# Patient Record
Sex: Male | Born: 1960
Health system: Southern US, Community
[De-identification: ages and names within clinical notes are randomized; demographics above are authoritative.]

## PROBLEM LIST (undated history)

## (undated) DIAGNOSIS — I10 Essential (primary) hypertension: Secondary | ICD-10-CM

## (undated) DIAGNOSIS — K219 Gastro-esophageal reflux disease without esophagitis: Secondary | ICD-10-CM

## (undated) DIAGNOSIS — E119 Type 2 diabetes mellitus without complications: Secondary | ICD-10-CM

## (undated) DIAGNOSIS — F819 Developmental disorder of scholastic skills, unspecified: Secondary | ICD-10-CM

## (undated) DIAGNOSIS — E785 Hyperlipidemia, unspecified: Secondary | ICD-10-CM

## (undated) DIAGNOSIS — Z8679 Personal history of other diseases of the circulatory system: Secondary | ICD-10-CM

## (undated) DIAGNOSIS — I639 Cerebral infarction, unspecified: Secondary | ICD-10-CM

## (undated) HISTORY — DX: Essential (primary) hypertension: I10

## (undated) HISTORY — PX: COLONOSCOPY: SHX174

## (undated) HISTORY — PX: LYMPHADENECTOMY: SHX15

## (undated) HISTORY — DX: Hyperlipidemia, unspecified: E78.5

## (undated) HISTORY — DX: Personal history of other diseases of the circulatory system: Z86.79

## (undated) HISTORY — DX: Type 2 diabetes mellitus without complications: E11.9

## (undated) HISTORY — DX: Gastro-esophageal reflux disease without esophagitis: K21.9

## (undated) HISTORY — PX: RHINOPLASTY: SUR1284

## (undated) HISTORY — DX: Cerebral infarction, unspecified: I63.9

## (undated) HISTORY — PX: SIGMOIDOSCOPY: SUR1295

---

## 2000-08-12 ENCOUNTER — Encounter: Admission: RE | Admit: 2000-08-12 | Discharge: 2000-11-10 | Payer: Self-pay | Admitting: Endocrinology

## 2001-10-03 ENCOUNTER — Encounter: Admission: RE | Admit: 2001-10-03 | Discharge: 2001-10-03 | Payer: Self-pay | Admitting: Endocrinology

## 2001-10-03 ENCOUNTER — Encounter: Payer: Self-pay | Admitting: Endocrinology

## 2004-11-09 ENCOUNTER — Inpatient Hospital Stay (HOSPITAL_COMMUNITY): Admission: EM | Admit: 2004-11-09 | Discharge: 2004-11-13 | Payer: Self-pay | Admitting: Emergency Medicine

## 2004-11-09 ENCOUNTER — Ambulatory Visit: Payer: Self-pay | Admitting: Physical Medicine & Rehabilitation

## 2004-11-10 ENCOUNTER — Encounter (INDEPENDENT_AMBULATORY_CARE_PROVIDER_SITE_OTHER): Payer: Self-pay | Admitting: *Deleted

## 2004-12-24 ENCOUNTER — Inpatient Hospital Stay (HOSPITAL_COMMUNITY): Admission: RE | Admit: 2004-12-24 | Discharge: 2004-12-27 | Payer: Self-pay | Admitting: Psychiatry

## 2004-12-24 ENCOUNTER — Ambulatory Visit: Payer: Self-pay | Admitting: Psychiatry

## 2005-01-30 ENCOUNTER — Encounter: Admission: RE | Admit: 2005-01-30 | Discharge: 2005-04-30 | Payer: Self-pay | Admitting: Endocrinology

## 2005-03-10 ENCOUNTER — Ambulatory Visit: Payer: Self-pay | Admitting: Internal Medicine

## 2005-03-17 ENCOUNTER — Ambulatory Visit (HOSPITAL_COMMUNITY): Admission: RE | Admit: 2005-03-17 | Discharge: 2005-03-17 | Payer: Self-pay | Admitting: Internal Medicine

## 2005-04-09 ENCOUNTER — Ambulatory Visit: Payer: Self-pay | Admitting: Internal Medicine

## 2005-04-27 ENCOUNTER — Ambulatory Visit: Payer: Self-pay | Admitting: Internal Medicine

## 2005-05-11 ENCOUNTER — Ambulatory Visit: Payer: Self-pay | Admitting: Internal Medicine

## 2005-05-12 ENCOUNTER — Encounter
Admission: RE | Admit: 2005-05-12 | Discharge: 2005-05-19 | Payer: Self-pay | Admitting: Physical Medicine & Rehabilitation

## 2005-07-06 ENCOUNTER — Ambulatory Visit: Payer: Self-pay | Admitting: Internal Medicine

## 2005-10-06 ENCOUNTER — Ambulatory Visit: Payer: Self-pay | Admitting: Internal Medicine

## 2005-11-16 ENCOUNTER — Ambulatory Visit: Payer: Self-pay | Admitting: Internal Medicine

## 2005-12-22 ENCOUNTER — Ambulatory Visit: Payer: Self-pay | Admitting: Internal Medicine

## 2005-12-25 ENCOUNTER — Ambulatory Visit: Payer: Self-pay | Admitting: Internal Medicine

## 2006-01-16 ENCOUNTER — Ambulatory Visit: Payer: Self-pay | Admitting: Family Medicine

## 2006-02-03 ENCOUNTER — Ambulatory Visit: Payer: Self-pay | Admitting: Internal Medicine

## 2006-02-04 LAB — CONVERTED CEMR LAB
ALT: 26 units/L (ref 0–40)
AST: 23 units/L (ref 0–37)
Albumin: 3.8 g/dL (ref 3.5–5.2)
Alkaline Phosphatase: 79 units/L (ref 39–117)
BUN: 30 mg/dL — ABNORMAL HIGH (ref 6–23)
Bilirubin, Direct: 0.1 mg/dL (ref 0.0–0.3)
CO2: 30 meq/L (ref 19–32)
Calcium: 9.6 mg/dL (ref 8.4–10.5)
Chloride: 104 meq/L (ref 96–112)
Chol/HDL Ratio, serum: 4.9
Cholesterol: 97 mg/dL (ref 0–200)
Creatinine, Ser: 1.1 mg/dL (ref 0.4–1.5)
GFR calc non Af Amer: 76 mL/min
Glomerular Filtration Rate, Af Am: 92 mL/min/{1.73_m2}
Glucose, Bld: 204 mg/dL — ABNORMAL HIGH (ref 70–99)
HDL: 19.7 mg/dL — ABNORMAL LOW (ref >39.0)
Hgb A1c MFr Bld: 6.6 % — ABNORMAL HIGH (ref 4.6–6.0)
LDL Cholesterol: 50 mg/dL (ref 0–99)
Potassium: 4.7 meq/L (ref 3.5–5.1)
Sodium: 142 meq/L (ref 135–145)
Total Bilirubin: 0.4 mg/dL (ref 0.3–1.2)
Total Protein: 6.5 g/dL (ref 6.0–8.3)
Triglyceride fasting, serum: 138 mg/dL (ref 0–149)
VLDL: 28 mg/dL (ref 0–40)

## 2006-03-22 ENCOUNTER — Ambulatory Visit: Payer: Self-pay | Admitting: Internal Medicine

## 2006-05-26 ENCOUNTER — Ambulatory Visit: Payer: Self-pay | Admitting: Internal Medicine

## 2006-05-27 LAB — CONVERTED CEMR LAB
ALT: 28 units/L (ref 0–40)
AST: 25 units/L (ref 0–37)
Albumin: 3.9 g/dL (ref 3.5–5.2)
Alkaline Phosphatase: 86 units/L (ref 39–117)
BUN: 23 mg/dL (ref 6–23)
Bilirubin, Direct: 0.1 mg/dL (ref 0.0–0.3)
CO2: 27 meq/L (ref 19–32)
Calcium: 9.8 mg/dL (ref 8.4–10.5)
Chloride: 96 meq/L (ref 96–112)
Cholesterol: 105 mg/dL (ref 0–200)
Creatinine, Ser: 1 mg/dL (ref 0.4–1.5)
GFR calc Af Amer: 103 mL/min
GFR calc non Af Amer: 85 mL/min
Glucose, Bld: 147 mg/dL — ABNORMAL HIGH (ref 70–99)
HDL: 24.5 mg/dL — ABNORMAL LOW (ref >39.0)
Hgb A1c MFr Bld: 7.8 % — ABNORMAL HIGH (ref 4.6–6.0)
LDL Cholesterol: 49 mg/dL (ref 0–99)
Potassium: 4 meq/L (ref 3.5–5.1)
Sodium: 141 meq/L (ref 135–145)
Total Bilirubin: 0.4 mg/dL (ref 0.3–1.2)
Total CHOL/HDL Ratio: 4.3
Total Protein: 6.5 g/dL (ref 6.0–8.3)
Triglycerides: 159 mg/dL — ABNORMAL HIGH (ref 0–149)
VLDL: 32 mg/dL (ref 0–40)

## 2006-08-02 ENCOUNTER — Ambulatory Visit: Payer: Self-pay | Admitting: Internal Medicine

## 2006-08-09 ENCOUNTER — Ambulatory Visit: Payer: Self-pay | Admitting: Internal Medicine

## 2006-09-14 ENCOUNTER — Ambulatory Visit: Payer: Self-pay | Admitting: Internal Medicine

## 2006-09-17 ENCOUNTER — Ambulatory Visit: Payer: Self-pay | Admitting: Internal Medicine

## 2006-09-17 ENCOUNTER — Inpatient Hospital Stay (HOSPITAL_COMMUNITY): Admission: AD | Admit: 2006-09-17 | Discharge: 2006-09-22 | Payer: Self-pay | Admitting: Internal Medicine

## 2006-09-18 ENCOUNTER — Ambulatory Visit: Payer: Self-pay | Admitting: Internal Medicine

## 2006-09-27 ENCOUNTER — Ambulatory Visit: Payer: Self-pay | Admitting: Internal Medicine

## 2006-09-28 DIAGNOSIS — E785 Hyperlipidemia, unspecified: Secondary | ICD-10-CM

## 2006-09-28 DIAGNOSIS — E119 Type 2 diabetes mellitus without complications: Secondary | ICD-10-CM

## 2006-09-28 DIAGNOSIS — E1129 Type 2 diabetes mellitus with other diabetic kidney complication: Secondary | ICD-10-CM

## 2006-09-28 DIAGNOSIS — I10 Essential (primary) hypertension: Secondary | ICD-10-CM

## 2006-09-28 DIAGNOSIS — Z8679 Personal history of other diseases of the circulatory system: Secondary | ICD-10-CM

## 2006-09-28 HISTORY — DX: Personal history of other diseases of the circulatory system: Z86.79

## 2006-09-28 HISTORY — DX: Hyperlipidemia, unspecified: E78.5

## 2006-09-28 HISTORY — DX: Type 2 diabetes mellitus without complications: E11.9

## 2006-09-28 HISTORY — DX: Essential (primary) hypertension: I10

## 2006-10-08 ENCOUNTER — Ambulatory Visit: Payer: Self-pay | Admitting: Internal Medicine

## 2006-10-15 ENCOUNTER — Ambulatory Visit: Payer: Self-pay | Admitting: Internal Medicine

## 2006-10-20 ENCOUNTER — Ambulatory Visit: Payer: Self-pay | Admitting: Internal Medicine

## 2006-10-26 ENCOUNTER — Encounter: Admission: RE | Admit: 2006-10-26 | Discharge: 2006-10-26 | Payer: Self-pay | Admitting: Internal Medicine

## 2006-10-27 ENCOUNTER — Ambulatory Visit: Payer: Self-pay | Admitting: Internal Medicine

## 2006-11-12 ENCOUNTER — Ambulatory Visit: Payer: Self-pay | Admitting: Internal Medicine

## 2006-11-19 ENCOUNTER — Ambulatory Visit: Payer: Self-pay | Admitting: Internal Medicine

## 2006-11-30 ENCOUNTER — Ambulatory Visit: Payer: Self-pay | Admitting: Internal Medicine

## 2006-12-01 ENCOUNTER — Telehealth: Payer: Self-pay | Admitting: Internal Medicine

## 2006-12-07 ENCOUNTER — Ambulatory Visit: Payer: Self-pay | Admitting: Internal Medicine

## 2007-03-22 ENCOUNTER — Ambulatory Visit: Payer: Self-pay | Admitting: Internal Medicine

## 2007-03-22 DIAGNOSIS — B351 Tinea unguium: Secondary | ICD-10-CM

## 2007-04-04 ENCOUNTER — Telehealth: Payer: Self-pay | Admitting: Internal Medicine

## 2007-04-11 ENCOUNTER — Telehealth: Payer: Self-pay | Admitting: Internal Medicine

## 2007-05-16 ENCOUNTER — Ambulatory Visit: Payer: Self-pay | Admitting: Internal Medicine

## 2007-05-23 ENCOUNTER — Ambulatory Visit: Payer: Self-pay | Admitting: Internal Medicine

## 2007-05-30 LAB — CONVERTED CEMR LAB
ALT: 19 units/L (ref 0–53)
Calcium: 9.1 mg/dL (ref 8.4–10.5)
Chloride: 103 meq/L (ref 96–112)
GFR calc Af Amer: 83 mL/min
GFR calc non Af Amer: 69 mL/min
Glucose, Bld: 160 mg/dL — ABNORMAL HIGH (ref 70–99)
HDL: 23 mg/dL — ABNORMAL LOW (ref >39.0)
LDL Cholesterol: 55 mg/dL (ref 0–99)
Total CHOL/HDL Ratio: 4.3
Triglycerides: 111 mg/dL (ref 0–149)
VLDL: 22 mg/dL (ref 0–40)

## 2007-06-01 ENCOUNTER — Telehealth (INDEPENDENT_AMBULATORY_CARE_PROVIDER_SITE_OTHER): Payer: Self-pay | Admitting: *Deleted

## 2007-06-17 ENCOUNTER — Telehealth: Payer: Self-pay | Admitting: Internal Medicine

## 2007-06-28 ENCOUNTER — Encounter: Payer: Self-pay | Admitting: Internal Medicine

## 2007-07-14 ENCOUNTER — Telehealth (INDEPENDENT_AMBULATORY_CARE_PROVIDER_SITE_OTHER): Payer: Self-pay | Admitting: *Deleted

## 2007-08-08 ENCOUNTER — Encounter: Payer: Self-pay | Admitting: Internal Medicine

## 2007-08-17 ENCOUNTER — Ambulatory Visit: Payer: Self-pay | Admitting: Internal Medicine

## 2007-11-17 ENCOUNTER — Ambulatory Visit: Payer: Self-pay | Admitting: Internal Medicine

## 2007-11-21 LAB — CONVERTED CEMR LAB
BUN: 16 mg/dL (ref 6–23)
Calcium: 9.8 mg/dL (ref 8.4–10.5)
Cholesterol: 97 mg/dL (ref 0–200)
GFR calc Af Amer: 102 mL/min
HDL: 23.5 mg/dL — ABNORMAL LOW (ref >39.0)
Hgb A1c MFr Bld: 7.2 % — ABNORMAL HIGH (ref 4.6–6.0)
Total CHOL/HDL Ratio: 4.1
Triglycerides: 91 mg/dL (ref 0–149)

## 2008-01-23 ENCOUNTER — Telehealth: Payer: Self-pay | Admitting: Internal Medicine

## 2008-01-30 ENCOUNTER — Ambulatory Visit: Payer: Self-pay | Admitting: Internal Medicine

## 2008-03-01 ENCOUNTER — Ambulatory Visit: Payer: Self-pay | Admitting: Internal Medicine

## 2008-03-02 ENCOUNTER — Encounter: Payer: Self-pay | Admitting: Internal Medicine

## 2008-03-02 LAB — CONVERTED CEMR LAB
Chloride: 102 meq/L (ref 96–112)
Creatinine, Ser: 1 mg/dL (ref 0.4–1.5)
GFR calc non Af Amer: 84 mL/min
HDL: 23.9 mg/dL — ABNORMAL LOW (ref >39.0)
Hgb A1c MFr Bld: 7.6 % — ABNORMAL HIGH (ref 4.6–6.0)
LDL Cholesterol: 56 mg/dL (ref 0–99)
Total CHOL/HDL Ratio: 4.1
Triglycerides: 95 mg/dL (ref 0–149)
VLDL: 19 mg/dL (ref 0–40)

## 2008-03-06 LAB — CONVERTED CEMR LAB
ALT: 22 units/L (ref 0–53)
Calcium: 9.9 mg/dL (ref 8.4–10.5)
Chloride: 104 meq/L (ref 96–112)
Cholesterol: 101 mg/dL (ref 0–200)
Creatinine, Ser: 1.1 mg/dL (ref 0.4–1.5)
Glucose, Bld: 99 mg/dL (ref 70–99)
HDL: 22.5 mg/dL — ABNORMAL LOW (ref >39.0)
Hgb A1c MFr Bld: 6.9 % — ABNORMAL HIGH (ref 4.6–6.0)
Potassium: 5 meq/L (ref 3.5–5.1)
Sodium: 146 meq/L — ABNORMAL HIGH (ref 135–145)
Triglycerides: 105 mg/dL (ref 0–149)
VLDL: 21 mg/dL (ref 0–40)

## 2008-04-09 ENCOUNTER — Telehealth: Payer: Self-pay | Admitting: Internal Medicine

## 2008-05-02 ENCOUNTER — Telehealth: Payer: Self-pay | Admitting: Internal Medicine

## 2008-05-04 ENCOUNTER — Ambulatory Visit: Payer: Self-pay | Admitting: Internal Medicine

## 2008-05-30 ENCOUNTER — Ambulatory Visit: Payer: Self-pay | Admitting: Internal Medicine

## 2008-06-01 LAB — CONVERTED CEMR LAB
AST: 23 units/L (ref 0–37)
BUN: 21 mg/dL (ref 6–23)
LDL Cholesterol: 69 mg/dL (ref 0–99)
Potassium: 4.7 meq/L (ref 3.5–5.1)
Total CHOL/HDL Ratio: 4.5
VLDL: 23 mg/dL (ref 0–40)

## 2008-07-11 ENCOUNTER — Telehealth: Payer: Self-pay | Admitting: *Deleted

## 2008-08-01 ENCOUNTER — Telehealth: Payer: Self-pay | Admitting: Internal Medicine

## 2008-11-26 ENCOUNTER — Ambulatory Visit: Payer: Self-pay | Admitting: Internal Medicine

## 2008-11-26 LAB — HM DIABETES FOOT EXAM

## 2009-01-24 ENCOUNTER — Ambulatory Visit: Payer: Self-pay | Admitting: Internal Medicine

## 2009-02-01 ENCOUNTER — Telehealth: Payer: Self-pay | Admitting: Internal Medicine

## 2009-02-18 ENCOUNTER — Telehealth: Payer: Self-pay | Admitting: *Deleted

## 2009-03-20 ENCOUNTER — Telehealth: Payer: Self-pay | Admitting: Internal Medicine

## 2009-03-20 LAB — HM COLONOSCOPY: HM Colonoscopy: NORMAL

## 2009-04-29 ENCOUNTER — Ambulatory Visit: Payer: Self-pay | Admitting: Internal Medicine

## 2009-05-03 LAB — CONVERTED CEMR LAB
AST: 16 units/L (ref 0–37)
Albumin: 3.4 g/dL — ABNORMAL LOW (ref 3.5–5.2)
Alkaline Phosphatase: 90 units/L (ref 39–117)
BUN: 20 mg/dL (ref 6–23)
Bilirubin, Direct: 0.1 mg/dL (ref 0.0–0.3)
Cholesterol: 119 mg/dL (ref 0–200)
Creatinine, Ser: 1.2 mg/dL (ref 0.4–1.5)
HDL: 29.6 mg/dL — ABNORMAL LOW (ref >39.00)
LDL Cholesterol: 52 mg/dL (ref 0–99)
Sodium: 138 meq/L (ref 135–145)
Total Bilirubin: 0.7 mg/dL (ref 0.3–1.2)
Total CHOL/HDL Ratio: 4
Triglycerides: 189 mg/dL — ABNORMAL HIGH (ref 0.0–149.0)

## 2009-07-22 ENCOUNTER — Ambulatory Visit: Payer: Self-pay | Admitting: Internal Medicine

## 2009-07-22 LAB — CONVERTED CEMR LAB
BUN: 20 mg/dL (ref 6–23)
CO2: 32 meq/L (ref 19–32)
Calcium: 9.6 mg/dL (ref 8.4–10.5)
Chloride: 105 meq/L (ref 96–112)
Cholesterol: 93 mg/dL (ref 0–200)
Creatinine, Ser: 0.9 mg/dL (ref 0.4–1.5)
GFR calc non Af Amer: 94.63 mL/min (ref >60)
HDL: 31.1 mg/dL — ABNORMAL LOW (ref >39.00)
LDL Cholesterol: 45 mg/dL (ref 0–99)
Triglycerides: 83 mg/dL (ref 0.0–149.0)

## 2009-07-29 ENCOUNTER — Ambulatory Visit: Payer: Self-pay | Admitting: Internal Medicine

## 2009-09-09 ENCOUNTER — Telehealth: Payer: Self-pay | Admitting: Internal Medicine

## 2009-09-20 ENCOUNTER — Telehealth: Payer: Self-pay | Admitting: Internal Medicine

## 2009-10-02 ENCOUNTER — Telehealth: Payer: Self-pay | Admitting: Internal Medicine

## 2010-01-21 ENCOUNTER — Ambulatory Visit: Payer: Self-pay | Admitting: Internal Medicine

## 2010-01-21 LAB — CONVERTED CEMR LAB
BUN: 19 mg/dL (ref 6–23)
Bilirubin, Direct: 0.1 mg/dL (ref 0.0–0.3)
Chloride: 105 meq/L (ref 96–112)
Cholesterol: 126 mg/dL (ref 0–200)
GFR calc non Af Amer: 79.05 mL/min (ref >60)
Glucose, Bld: 161 mg/dL — ABNORMAL HIGH (ref 70–99)
HDL: 26.9 mg/dL — ABNORMAL LOW (ref >39.00)
Sodium: 143 meq/L (ref 135–145)
Triglycerides: 126 mg/dL (ref 0.0–149.0)
VLDL: 25.2 mg/dL (ref 0.0–40.0)

## 2010-01-28 ENCOUNTER — Ambulatory Visit: Payer: Self-pay | Admitting: Internal Medicine

## 2010-03-31 ENCOUNTER — Encounter: Payer: Self-pay | Admitting: Internal Medicine

## 2010-05-22 NOTE — Progress Notes (Signed)
Summary: request  Phone Note From Pharmacy   Caller: [facility] via [facility] Call For: Duvall Comes  Summary of Call: requests reason for qid testing on insulin as medicare only covers tid Initial call taken by: Rica Records, RN,  Sep 09, 2009 3:25 PM  Follow-up for Phone Call        three times a day is ok Follow-up by: Phoebe Sharps MD,  Sep 09, 2009 4:56 PM  Additional Follow-up for Phone Call Additional follow up Details #1::        will fax back respoinse of three times a day testing. Additional Follow-up by: Rica Records, RN,  Sep 10, 2009 8:42 AM    Additional Follow-up for Phone Call Additional follow up Details #2::    Left message on machine. [name] to call back. Rica Records, RN  Sep 10, 2009 3:35 PM   [name] notified and will tell patient. Follow-up by: Rica Records, RN,  Sep 11, 2009 10:11 AM  New/Updated Medications: ONETOUCH ULTRA TEST   STRP (GLUCOSE BLOOD) three times a day as directed

## 2010-05-22 NOTE — Progress Notes (Signed)
Summary: samples please  Phone Note Call from Patient Call back at Home Phone [phone] Call back at [phone]   Caller: Patient Call For: Phoebe Sharps MD Summary of Call: pt needs samples of azor and lantus, and crestor.  Call [phone] Initial call taken by: Glo Herring,  October 02, 2009 3:56 PM  Follow-up for Phone Call        ok Follow-up by: Phoebe Sharps MD,  October 03, 2009 8:06 AM  Additional Follow-up for Phone Call Additional follow up Details #1::        Given lantus yesterday.  Crestor was changed to simvastatin in April 2011 and no samples available. Azor samples ready for pick up.  [name] notified. Additional Follow-up by: Rica Records, RN,  October 03, 2009 11:33 AM

## 2010-05-22 NOTE — Assessment & Plan Note (Signed)
Summary: fup//ccm   Vital Signs:  Patient profile:   50 year old male Weight:      194 pounds Temp:     97.8 degrees F oral BP sitting:   122 / 82  Vitals Entered By: Deanna Artis CMA (January 28, 2010 8:26 AM) CC: rov Is Patient Diabetic? Yes Pain Assessment Patient in pain? no        CC:  rov.  History of Present Illness:  Follow-Up Visit      This is a 50 year old man who presents for Follow-up visit.  The patient denies chest pain and palpitations.  Since the last visit the patient notes no new problems or concerns.  The patient reports taking meds as prescribed and monitoring blood sugars.  When questioned about possible medication side effects, the patient notes none.    All other systems reviewed and were negative   Current Problems (verified): 1)  Onychomycosis, Toenails  (ICD-110.1) 2)  Family History Diabetes 1st Degree Relative  (ICD-V18.0) 3)  Cerebrovascular Accident, Hx of  (ICD-V12.50) 4)  Hypertension  (ICD-401.9) 5)  Hyperlipidemia  (ICD-272.4) 6)  Diabetes Mellitus, Type II  (ICD-250.00)  Current Medications (verified): 1)  Aspir-81 81 Mg Tbec (Aspirin) .... Take 1 Tablet By Mouth Once A Day 2)  Azor 10-40 Mg  Tabs (Amlodipine-Olmesartan) .... Take 1 Tab By Mouth Every Day 3)  Simvastatin 20 Mg Tabs (Simvastatin) .[name].. 1 By Mouth At Bedtime 4)  Hydrochlorothiazide 25 Mg Tabs (Hydrochlorothiazide) .... Take 1 Tablet By Mouth Every Morning 5)  Labetalol Hcl 200 Mg Tabs (Labetalol Hcl) .[name].. 1 Tab Twice Daily 6)  Lantus 100 Unit/ml Soln (Insulin Glargine) .... Inject 16 Unit Subcutaneously Every Morning and 16 Units Every Evening 7)  Metformin Hcl 500 Mg Tabs (Metformin Hcl) .... Take 2 Tablets By Mouth Twice A Day 8)  Onetouch Ultra Test   Strp (Glucose Blood) .... Three Times A Day As Directed 9)  Prilosec Otc 20 Mg  Tbec (Omeprazole Magnesium) .... Once Daily 10)  Eq Stool Softener 100 Mg  Caps (Docusate Sodium) .... Once Daily 11)  Gabapentin 300 Mg  Caps (Gabapentin) .... Take 2 By Mouth At Bedtime  Allergies (verified): 1)  Sulfamethoxazole (Sulfamethoxazole)  Social History: disability after stroke lives with family members Current Smoker Regular exercise-no  Physical Exam  General:  alert and well-developed.   Head:  normocephalic and atraumatic.   Eyes:  pupils equal and pupils round.   Ears:  R ear normal and L ear normal.   Neck:  No deformities, masses, or tenderness noted. Chest Wall:  No deformities, masses, tenderness or gynecomastia noted. Lungs:  normal respiratory effort and no accessory muscle use.   Heart:  normal rate and regular rhythm.   Abdomen:  soft and non-tender.   Skin:  turgor normal and color normal.   Cervical Nodes:  no anterior cervical adenopathy and no posterior cervical adenopathy.   Psych:  normally interactive and good eye contact.     Impression & Recommendations:  Problem # 1:  DIABETES MELLITUS, TYPE II (ICD-250.00) a1c ok note weight gain---advised weight loss His updated medication list for this problem includes:    Aspir-81 81 Mg Tbec (Aspirin) .[name]... Take 1 tablet by mouth once a day    Azor 10-40 Mg Tabs (Amlodipine-olmesartan) .[name]... Take 1 tab by mouth every day    Lantus 100 Unit/ml Soln (Insulin glargine) ..... Inject 16 unit subcutaneously every morning and 16 units every evening  Metformin Hcl 500 Mg Tabs (Metformin hcl) .[name]... Take 2 tablets by mouth twice a day  Labs Reviewed: Creat: 1.1 (01/21/2010)     Last Eye Exam: normal-pt's report (03/20/2009) Reviewed HgBA1c results: 6.9 (01/21/2010)  6.4 (07/22/2009)  Problem # 2:  HYPERLIPIDEMIA (ICD-272.4) controlled continue current medications  His updated medication list for this problem includes:    Simvastatin 20 Mg Tabs (Simvastatin) .[name]... 1 by mouth at bedtime  Labs Reviewed: SGOT: 17 (01/21/2010)   SGPT: 19 (01/21/2010)   HDL:26.90 (01/21/2010), 31.10 (07/22/2009)  LDL:74 (01/21/2010), 45 (07/22/2009)   Chol:126 (01/21/2010), 93 (07/22/2009)  Trig:126.0 (01/21/2010), 83.0 (07/22/2009)  Problem # 3:  HYPERTENSION (ICD-401.9) controlled continue current medications  His updated medication list for this problem includes:    Azor 10-40 Mg Tabs (Amlodipine-olmesartan) .[name]... Take 1 tab by mouth every day    Hydrochlorothiazide 25 Mg Tabs (Hydrochlorothiazide) .[name]... Take 1 tablet by mouth every morning    Labetalol Hcl 200 Mg Tabs (Labetalol hcl) .[name]... 1 tab twice daily  BP today: 122/82 Prior BP: 132/86 (07/29/2009)  Labs Reviewed: K+: 4.1 (01/21/2010) Creat: : 1.1 (01/21/2010)   Chol: 126 (01/21/2010)   HDL: 26.90 (01/21/2010)   LDL: 74 (01/21/2010)   TG: 126.0 (01/21/2010)  Problem # 4:  CEREBROVASCULAR ACCIDENT, HX OF (ICD-V12.50) no recurrence  Complete Medication List: 1)  Aspir-81 81 Mg Tbec (Aspirin) .... Take 1 tablet by mouth once a day 2)  Azor 10-40 Mg Tabs (Amlodipine-olmesartan) .... Take 1 tab by mouth every day 3)  Simvastatin 20 Mg Tabs (Simvastatin) .[name].. 1 by mouth at bedtime 4)  Hydrochlorothiazide 25 Mg Tabs (Hydrochlorothiazide) .... Take 1 tablet by mouth every morning 5)  Labetalol Hcl 200 Mg Tabs (Labetalol hcl) .[name].. 1 tab twice daily 6)  Lantus 100 Unit/ml Soln (Insulin glargine) .... Inject 16 unit subcutaneously every morning and 16 units every evening 7)  Metformin Hcl 500 Mg Tabs (Metformin hcl) .... Take 2 tablets by mouth twice a day 8)  Onetouch Ultra Test Strp (Glucose blood) .... Three times a day as directed 9)  Prilosec Otc 20 Mg Tbec (Omeprazole magnesium) .... Once daily 10)  Eq Stool Softener 100 Mg Caps (Docusate sodium) .... Once daily 11)  Gabapentin 300 Mg Caps (Gabapentin) .... Take 2 by mouth at bedtime  Patient Instructions: 1)  Please schedule a follow-up appointment in 4 months. 2)  labs one week prior to visit 3)  lipids---272.4 4)  lfts-995.2 5)  bmet-995.2 6)  A1C-250.02 7)

## 2010-05-22 NOTE — Assessment & Plan Note (Signed)
Summary: 3 month rov/et   Vital Signs:  Patient profile:   50 year old male Weight:      186 pounds Temp:     97.8 degrees F oral Pulse rate:   72 / minute Pulse rhythm:   regular Resp:     12 per minute BP sitting:   132 / 86  (left arm) Cuff size:   regular  Vitals Entered By: Rica Records, RN (July 29, 2009 8:50 AM) CC: 3 month rov, labs done--CBGs 85-138 fasting, 78-163 nonfasting at home--c/o pain both joints right thumb Is Patient Diabetic? Yes Did you bring your meter with you today? No   CC:  3 month rov, labs done--CBGs 85-138 fasting, and 78-163 nonfasting at home--c/o pain both joints right thumb.  History of Present Illness:  Follow-Up Visit      This is a 50 year old man who presents for Follow-up visit.  The patient denies chest pain and palpitations.  Since the last visit the patient notes no new problems or concerns.  The patient reports taking meds as prescribed and monitoring blood sugars.  When questioned about possible medication side effects, the patient notes none.    All other systems reviewed and were negative     Preventive Screening-Counseling & Management  Alcohol-Tobacco     Smoking Status: current     Smoking Cessation Counseling: yes     Packs/Day: 1.0  Current Problems (verified): 1)  Onychomycosis, Toenails  (ICD-110.1) 2)  Family History Diabetes 1st Degree Relative  (ICD-V18.0) 3)  Cerebrovascular Accident, Hx of  (ICD-V12.50) 4)  Hypertension  (ICD-401.9) 5)  Hyperlipidemia  (ICD-272.4) 6)  Diabetes Mellitus, Type II  (ICD-250.00)  Current Medications (verified): 1)  Aspir-81 81 Mg Tbec (Aspirin) .... Take 1 Tablet By Mouth Once A Day 2)  Azor 10-40 Mg  Tabs (Amlodipine-Olmesartan) .... Take 1 Tab By Mouth Every Day 3)  Crestor 10 Mg Tabs (Rosuvastatin Calcium) .... Take 1 Tablet By Mouth At Bedtime 4)  Hydrochlorothiazide 25 Mg Tabs (Hydrochlorothiazide) .... Take 1 Tablet By Mouth Every Morning 5)  Labetalol Hcl 200 Mg Tabs  (Labetalol Hcl) .[name].. 1 Tab Twice Daily 6)  Lantus 100 Unit/ml Soln (Insulin Glargine) .... Inject 24 Unit Subcutaneously Every Morning and 24 Units Every Evening 7)  Metformin Hcl 500 Mg Tabs (Metformin Hcl) .... Take 2 Tablets By Mouth Twice A Day 8)  Gabapentin 100 Mg Caps (Gabapentin) .... Take Six Capsules Every Bedtime 9)  Onetouch Ultra Test   Strp (Glucose Blood) .... Qid As Directed 10)  Prilosec Otc 20 Mg  Tbec (Omeprazole Magnesium) .... Once Daily 11)  Eq Stool Softener 100 Mg  Caps (Docusate Sodium) .... 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   Head:  normocephalic and atraumatic.   Eyes:  pupils equal and pupils round.   Ears:  R ear normal and L ear normal.   Neck:  No deformities, masses, or tenderness noted. Chest Wall:  No deformities, masses, tenderness or gynecomastia noted. Lungs:  normal  respiratory effort and no accessory muscle use.   Heart:  normal rate and regular rhythm.   Abdomen:  soft and non-tender.   Msk:  No deformity or scoliosis noted of thoracic or lumbar spine.   Neurologic:  cranial nerves II-XII intact and gait normal.     Impression & Recommendations:  Problem # 1:  CEREBROVASCULAR ACCIDENT, HX OF (ICD-V12.50) no recurrence  Problem # 2:  HYPERTENSION (ICD-401.9)  reasonable control continue current  medications  His updated medication list for this problem includes:    Azor 10-40 Mg Tabs (Amlodipine-olmesartan) .[name]... Take 1 tab by mouth every day    Hydrochlorothiazide 25 Mg Tabs (Hydrochlorothiazide) .[name]... Take 1 tablet by mouth every morning    Labetalol Hcl 200 Mg Tabs (Labetalol hcl) .[name]... 1 tab twice daily  BP today: 132/86 Prior BP: 142/88 (04/29/2009)  Labs Reviewed: K+: 4.6 (07/22/2009) Creat: : 0.9 (07/22/2009)   Chol: 93 (07/22/2009)   HDL: 31.10 (07/22/2009)   LDL: 45 (07/22/2009)   TG: 83.0 (07/22/2009)  Orders: Prescription Created Electronically [phone])  Problem # 3:  DIABETES MELLITUS, TYPE II (ICD-250.00) Assessment: Improved  note weight loss decrease lantus see new dose  congratulated on weight loss His updated medication list for this problem includes:    Aspir-81 81 Mg Tbec (Aspirin) .[name]... Take 1 tablet by mouth once a day    Azor 10-40 Mg Tabs (Amlodipine-olmesartan) .[name]... Take 1 tab by mouth every day    Lantus 100 Unit/ml Soln (Insulin glargine) ..... Inject 16 unit subcutaneously every morning and 16 units every evening    Metformin Hcl 500 Mg Tabs (Metformin hcl) .[name]... Take 2 tablets by mouth twice a day  Labs Reviewed: Creat: 0.9 (07/22/2009)     Last Eye Exam: normal-pt's report (03/20/2009) Reviewed HgBA1c results: 6.4 (07/22/2009)  11.5 (04/29/2009)  Orders: Prescription Created Electronically [phone])  Problem # 4:  HYPERLIPIDEMIA (ICD-272.4)  His updated medication list for this problem includes:    Simvastatin 20 Mg Tabs (Simvastatin) .[name]... 1 by mouth at bedtime  Labs Reviewed: SGOT: 19 (07/22/2009)   SGPT: 17 (07/22/2009)   HDL:31.10 (07/22/2009), 29.60 (04/29/2009)  LDL:45 (07/22/2009), 52 (04/29/2009)  Chol:93 (07/22/2009), 119 (04/29/2009)  Trig:83.0 (07/22/2009), 189.0 (04/29/2009)  Orders: Prescription Created Electronically [phone])  Complete Medication List: 1)  Aspir-81 81 Mg Tbec (Aspirin) .... Take 1 tablet by mouth  once a day 2)  Azor 10-40 Mg Tabs (Amlodipine-olmesartan) .... Take 1 tab by mouth every day 3)  Simvastatin 20 Mg Tabs (Simvastatin) .[name].. 1 by mouth at bedtime 4)  Hydrochlorothiazide 25 Mg Tabs (Hydrochlorothiazide) .... Take 1 tablet by mouth every morning 5)  Labetalol Hcl 200 Mg Tabs (Labetalol hcl) .[name].. 1 tab twice daily 6)  Lantus 100 Unit/ml Soln (Insulin glargine) .... Inject 16 unit subcutaneously every morning and 16 units every evening 7)  Metformin Hcl 500 Mg Tabs (Metformin hcl) .... Take 2 tablets by mouth twice a day 8)  Gabapentin 100 Mg Caps (Gabapentin) .... Take six capsules every bedtime 9)  Onetouch Ultra Test Strp (Glucose blood) .... Qid as directed 10)  Prilosec Otc 20 Mg Tbec (Omeprazole magnesium) .... Once daily 11)  Eq Stool Softener 100 Mg Caps (Docusate sodium) .... Once daily  Patient Instructions: 1)  Please schedule a follow-up appointment in 4 months. 2)  labs one week prior to visit 3)  lipids---272.4 4)  lfts-995.2 5)  bmet-995.2 6)  A1C-250.02 7)     Prescriptions: SIMVASTATIN 20 MG TABS (SIMVASTATIN) 1 by mouth at  bedtime  #90 x 3   Entered and Authorized by:   Phoebe Sharps MD   Signed by:   Phoebe Sharps MD on 07/29/2009   Method used:   [facility]        [phone][facility] (retail[facility]       [address]       [place], [place]  [address]       Ph: [phone]       Fax: [phone]   RxID:   [id number]

## 2010-05-22 NOTE — Progress Notes (Signed)
Summary: GABAPENTIN   Phone Note Refill Request Message [facility]  [id number] on September 20, 2009 10:56 AM  Refills Requested: Medication #1:  GABAPENTIN 100 MG CAPS Take six capsules every bedtime E-Scribe Request asking if this strength can be increased? please advise.   Method Requested: Electronic Initial call taken by: Edwin Dada CMA Deborra Medina),  September 20, 2009 10:56 AM  Follow-up for Phone Call        change to 300 mg 2 by mouth at bedtime  one year refill Follow-up by: Phoebe Sharps MD,  September 20, 2009 11:07 AM  Additional Follow-up for Phone Call Additional follow up Details #1::        Rx faxed to pharmacy Additional Follow-up by: DeShannon Smith CMA Deborra Medina),  September 20, 2009 11:34 AM    New/Updated Medications: GABAPENTIN 300 MG CAPS (GABAPENTIN) take 2 by mouth at bedtime Prescriptions: GABAPENTIN 300 MG CAPS (GABAPENTIN) take 2 by mouth at bedtime  #180 x 3   Entered by:   Edwin Dada CMA ([place])   Authorized by:   Phoebe Sharps MD   Signed by:   Edwin Dada CMA ([place]) on 09/20/2009   Method used:   [facility]        [id number]       [address]       [place], [place]  [address]       Ph: [phone] or [phone]       Fax: [phone]   RxIDFZ:[id number] GABAPENTIN 300 MG CAPS (GABAPENTIN) take 2 by mouth at bedtime  #180 x 3   Entered by:   Edwin Dada CMA ([place])   Authorized by:   Phoebe Sharps MD   Signed by:   Edwin Dada CMA ([place]) on 09/20/2009   Method used:   [facility]        [phone][facility] ([facility]ail[facility]       [address]       [name], [place]  [address]       Ph: [phone]       Fax: [phone]   RxID:   [id number]

## 2010-05-22 NOTE — Assessment & Plan Note (Signed)
Summary: fup//ccm   Vital Signs:  Patient profile:   50 year old male Weight:      196 pounds Temp:     98.3 degrees F Pulse rate:   82 / minute Resp:     12 per minute BP sitting:   142 / 88  (left arm)  Vitals Entered By: Rica Records, RN (April 29, 2009 8:59 AM)   History of Present Illness:  Follow-Up Visit      This is a 50 year old man who presents for Follow-up visit.  The patient denies chest pain, palpitations, dizziness, syncope, high blood sugar symptoms, edema, SOB, DOE, PND, and orthopnea.  The patient reports taking meds as prescribed, not monitoring BP, monitoring blood sugars, and dietary noncompliance.  When questioned about possible medication side effects, the patient notes none.    All other systems reviewed and were negative   Preventive Screening-Counseling & Management  Alcohol-Tobacco     Smoking Status: current     Smoking Cessation Counseling: yes     Packs/Day: 1.0  Current Problems (verified): 1)  Onychomycosis, Toenails  (ICD-110.1) 2)  Family History Diabetes 1st Degree Relative  (ICD-V18.0) 3)  Cerebrovascular Accident, Hx of  (ICD-V12.50) 4)  Hypertension  (ICD-401.9) 5)  Hyperlipidemia  (ICD-272.4) 6)  Diabetes Mellitus, Type II  (ICD-250.00)  Current Medications (verified): 1)  Aspir-81 81 Mg Tbec (Aspirin) .... Take 1 Tablet By Mouth Once A Day 2)  Azor 10-40 Mg  Tabs (Amlodipine-Olmesartan) .... Take 1 Tab By Mouth Every Day 3)  Crestor 10 Mg Tabs (Rosuvastatin Calcium) .... Take 1 Tablet By Mouth At Bedtime 4)  Hydrochlorothiazide 25 Mg Tabs (Hydrochlorothiazide) .... Take 1 Tablet By Mouth Every Morning 5)  Labetalol Hcl 100 Mg Tabs (Labetalol Hcl) .... Take Two Twice Daily 6)  Lantus 100 Unit/ml Soln (Insulin Glargine) .... Inject 20 Unit Subcutaneously Every Morning and 20 Units Every Evening 7)  Metformin Hcl 500 Mg Tabs (Metformin Hcl) .... Take 1 Tablet By Mouth Twice A Day 8)  Gabapentin 100 Mg Caps (Gabapentin) .... Take Six  Capsules Every Bedtime 9)  Onetouch Ultra Test   Strp (Glucose Blood) .... Qid As Directed 10)  Prilosec Otc 20 Mg  Tbec (Omeprazole Magnesium) .... Once Daily 11)  Eq Stool Softener 100 Mg  Caps (Docusate Sodium) .... Once Daily  Allergies: 1)  Sulfamethoxazole (Sulfamethoxazole)  Comments:  Nurse/Medical Assistant: FU, has form--CBGs 170-250 at home--c/o ears being clogged, L>R  The patient's medications and allergies were reviewed with the patient and were updated in the Medication and Allergy Lists. 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   Ears:  R ear normal and L ear normal.   Neck:  No deformities, masses, or tenderness noted. Chest Wall:  No deformities, masses, tenderness or gynecomastia noted. Lungs:  Normal  respiratory effort, chest expands symmetrically. Lungs are clear to auscultation, no crackles or wheezes. Heart:  Normal rate and regular rhythm. S1 and S2 normal without gallop, murmur, click, rub or other extra sounds. Abdomen:  Bowel sounds positive,abdomen soft and non-tender without masses, organomegaly or hernias  noted. Msk:  No deformity or scoliosis noted of thoracic or lumbar spine.   Pulses:  R radial normal and L radial normal.   Neurologic:  cranial nerves II-XII intact and gait normal.    Diabetes Management Exam:    Eye Exam:       Eye Exam done elsewhere          Date: 03/20/2009          Results: normal-pt's report          Done by: ophthalmo   Impression & Recommendations:  Problem # 1:  HYPERTENSION (ICD-401.9) will increase labetolol His updated medication list for this problem includes:    Azor 10-40 Mg Tabs (Amlodipine-olmesartan) .[name]... Take 1 tab by mouth every day    Hydrochlorothiazide 25 Mg Tabs (Hydrochlorothiazide) .[name]... Take 1 tablet by mouth every morning    Labetalol Hcl 200 Mg Tabs (Labetalol hcl) .[name]... 1 tab twice daily  BP today: 142/88 Prior BP: 148/96 (11/26/2008)  Labs Reviewed: K+: 4.7 (05/30/2008) Creat: : 1.0 (05/30/2008)   Chol: 117 (05/30/2008)   HDL: 25.9 (05/30/2008)   LDL: 69 (05/30/2008)   TG: 113 (05/30/2008)  Problem # 2:  HYPERLIPIDEMIA (ICD-272.4)  check labs today His updated medication list for this problem includes:    Crestor 10 Mg Tabs (Rosuvastatin calcium) .[name]... Take 1 tablet by mouth at bedtime  Labs Reviewed: SGOT: 23 (05/30/2008)   SGPT: 27 (05/30/2008)   HDL:25.9 (05/30/2008), 23.9 (03/01/2008)  LDL:69 (05/30/2008), 56 (03/01/2008)  Chol:117 (05/30/2008), 99 (03/01/2008)  Trig:113 (05/30/2008), 95 (03/01/2008)  Orders: TLB-Lipid Panel (80061-LIPID)  Problem # 3:  DIABETES MELLITUS, TYPE II (ICD-250.00)  check labs today   His updated medication list for this problem includes:    Aspir-81 81 Mg Tbec (Aspirin) .[name]... Take 1 tablet by mouth once a day    Azor 10-40 Mg Tabs (Amlodipine-olmesartan) .[name]... Take 1 tab by mouth every day    Lantus 100 Unit/ml Soln (Insulin glargine) ..... Inject 20 unit subcutaneously every morning and 20 units every evening    Metformin Hcl 500 Mg Tabs (Metformin hcl) .[name]... Take 1 tablet by mouth  twice a day  Labs Reviewed: Creat: 1.0 (05/30/2008)     Last Eye Exam: normal-pt's report (03/20/2009) Reviewed HgBA1c results: 8.6 (05/30/2008)  7.6 (03/01/2008)  Orders: Venipuncture [id number]) TLB-BMP (Basic Metabolic Panel-BMET) (99991111) TLB-A1C / Hgb A1C (Glycohemoglobin) (83036-A1C)  Complete Medication List: 1)  Aspir-81 81 Mg Tbec (Aspirin) .... Take 1 tablet by mouth once a day 2)  Azor 10-40 Mg Tabs (Amlodipine-olmesartan) .... Take 1 tab by mouth every day 3)  Crestor 10 Mg Tabs (Rosuvastatin calcium) .... Take 1 tablet by mouth at bedtime 4)  Hydrochlorothiazide 25 Mg Tabs (Hydrochlorothiazide) .... Take 1 tablet by mouth every morning 5)  Labetalol Hcl 200 Mg Tabs (Labetalol hcl) .[name].. 1 tab twice daily 6)  Lantus 100 Unit/ml Soln (Insulin glargine) .... Inject 20 unit subcutaneously every morning and 20 units every evening 7)  Metformin Hcl 500 Mg Tabs (Metformin hcl) .... Take 1 tablet by mouth twice a day 8)  Gabapentin 100 Mg Caps (Gabapentin) .... Take six capsules every bedtime 9)  Onetouch Ultra Test Strp (Glucose blood) .... Qid as directed  10)  Prilosec Otc 20 Mg Tbec (Omeprazole magnesium) .... Once daily 11)  Eq Stool Softener 100 Mg Caps (Docusate sodium) .... Once daily  Other Orders: TLB-Hepatic/Liver Function Pnl (80076-HEPATIC) Prescriptions: LABETALOL HCL 200 MG TABS (LABETALOL HCL) 1 tab twice daily  #180 x 3   Entered and Authorized by:   Phoebe Sharps MD   Signed by:   Phoebe Sharps MD on 04/29/2009   Method used:   Electronically to        [facility] [phone]* (retail)       [address]       [place], [place]  [address]       Ph: [phone]       Fax: [phone]   RxID:   [id number]

## 2010-05-22 NOTE — Letter (Signed)
Summary: Attending Physician's Statement  Attending 21 Statement   Imported By: Laural Benes 05/01/2009 13:39:18  _____________________________________________________________________  External Attachment:    Type:   Image     Comment:   External Document

## 2010-05-22 NOTE — Letter (Signed)
Summary: REPORT OF DISABILITY  REPORT OF DISABILITY   Imported ByMabeline Caras 03/31/2010 08:36:29  _____________________________________________________________________  External Attachment:    Type:   Image     Comment:   External Document

## 2010-05-27 ENCOUNTER — Other Ambulatory Visit (INDEPENDENT_AMBULATORY_CARE_PROVIDER_SITE_OTHER): Payer: Medicare Other | Admitting: Internal Medicine

## 2010-05-27 ENCOUNTER — Other Ambulatory Visit: Payer: Self-pay | Admitting: Internal Medicine

## 2010-05-27 DIAGNOSIS — E785 Hyperlipidemia, unspecified: Secondary | ICD-10-CM

## 2010-05-27 DIAGNOSIS — T887XXA Unspecified adverse effect of drug or medicament, initial encounter: Secondary | ICD-10-CM

## 2010-05-27 DIAGNOSIS — I10 Essential (primary) hypertension: Secondary | ICD-10-CM

## 2010-05-27 DIAGNOSIS — E119 Type 2 diabetes mellitus without complications: Secondary | ICD-10-CM

## 2010-05-27 LAB — HEPATIC FUNCTION PANEL
ALT: 21 U/L (ref 0–53)
Albumin: 3.5 g/dL (ref 3.5–5.2)
Alkaline Phosphatase: 88 U/L (ref 39–117)

## 2010-05-27 LAB — LIPID PANEL
HDL: 27.1 mg/dL — ABNORMAL LOW (ref >39.00)
VLDL: 37.6 mg/dL (ref 0.0–40.0)

## 2010-05-27 LAB — BASIC METABOLIC PANEL
Chloride: 103 mEq/L (ref 96–112)
Creatinine, Ser: 1 mg/dL (ref 0.4–1.5)
Potassium: 4.8 mEq/L (ref 3.5–5.1)
Sodium: 142 mEq/L (ref 135–145)

## 2010-06-03 ENCOUNTER — Encounter: Payer: Self-pay | Admitting: Internal Medicine

## 2010-06-03 ENCOUNTER — Ambulatory Visit (INDEPENDENT_AMBULATORY_CARE_PROVIDER_SITE_OTHER): Payer: Medicare Other | Admitting: Internal Medicine

## 2010-06-03 DIAGNOSIS — I10 Essential (primary) hypertension: Secondary | ICD-10-CM

## 2010-06-03 DIAGNOSIS — E119 Type 2 diabetes mellitus without complications: Secondary | ICD-10-CM

## 2010-06-03 DIAGNOSIS — Z0000 Encounter for general adult medical examination without abnormal findings: Secondary | ICD-10-CM

## 2010-06-03 DIAGNOSIS — E785 Hyperlipidemia, unspecified: Secondary | ICD-10-CM

## 2010-06-03 DIAGNOSIS — Z8679 Personal history of other diseases of the circulatory system: Secondary | ICD-10-CM

## 2010-06-03 DIAGNOSIS — B351 Tinea unguium: Secondary | ICD-10-CM

## 2010-06-03 NOTE — Progress Notes (Signed)
  Subjective:    Patient ID: William [name], male    DOB: 1960/06/13, 50 y.o.   MRN: [id number]  HPI   patient comes in for followup of multiple medical problems including type 2 diabetes, hyperlipidemia, hypertension. The patient does check his blood sugar but does not check  blood pressure at home. The patetient does not follow an exercise or diet program. The patient denies any polyuria, polydipsia.  In the past the patient has gone to diabetic treatment center. The patient is tolerating medications without difficulty. The patient does admit 2 medication compliance. Pt admits to gaining weight and not exercising.  Past Medical History  Diagnosis Date  . DIABETES MELLITUS, TYPE II 09/28/2006  . HYPERLIPIDEMIA 09/28/2006  . HYPERTENSION 09/28/2006  . CEREBROVASCULAR ACCIDENT, HX OF 09/28/2006   Past Surgical History  Procedure Date  . Rhinoplasty   . Lymphadenectomy     reports that he has been smoking Cigarettes.  He has been smoking about 1 pack per day. He does not have any smokeless tobacco history on file. He reports that he does not drink alcohol or use illicit drugs. family history includes Alcohol abuse in his brother; Diabetes in his brother and father; Heart attack in his sister; Lung cancer in his mother; and Stroke in his father.      Review of Systems   patient denies chest pain, shortness of breath, orthopnea. Denies lower extremity edema, abdominal pain, change in appetite, change in bowel movements. Patient denies rashes, musculoskeletal complaints. No other specific complaints in a complete review of systems.      Objective:   Physical Exam  well-developed well-nourished male in no acute distress. HEENT exam atraumatic, normocephalic, neck supple without jugular venous distention. Chest clear to auscultation cardiac exam S1-S2 are regular. Abdominal exam overweight with bowel sounds, soft and nontender. Extremities no edema. Neurologic exam is alert with a normal  gait.       Assessment & Plan:

## 2010-06-03 NOTE — Assessment & Plan Note (Signed)
Reasonable control. Continue current medications.

## 2010-06-03 NOTE — Assessment & Plan Note (Signed)
Controlled. Continue current medications. He's not had any side effects.

## 2010-06-03 NOTE — Assessment & Plan Note (Signed)
Poor control. No weight gain. He is taking his medications. I won't change his medications at this time. Otherwise can concentrate on weight loss, low calorie diet, regular exercise.

## 2010-06-04 ENCOUNTER — Other Ambulatory Visit: Payer: Self-pay | Admitting: Internal Medicine

## 2010-06-19 ENCOUNTER — Other Ambulatory Visit: Payer: Self-pay | Admitting: Internal Medicine

## 2010-07-07 ENCOUNTER — Other Ambulatory Visit: Payer: Self-pay | Admitting: *Deleted

## 2010-07-07 DIAGNOSIS — I10 Essential (primary) hypertension: Secondary | ICD-10-CM

## 2010-07-07 MED ORDER — HYDROCHLOROTHIAZIDE 25 MG PO TABS: 25.0000 mg | ORAL_TABLET | Freq: Every day | ORAL | Status: DC

## 2010-08-13 ENCOUNTER — Ambulatory Visit (AMBULATORY_SURGERY_CENTER): Payer: Medicare Other | Admitting: *Deleted

## 2010-08-13 VITALS — Ht 69.0 in | Wt 198.0 lb

## 2010-08-13 DIAGNOSIS — Z1211 Encounter for screening for malignant neoplasm of colon: Secondary | ICD-10-CM

## 2010-08-13 MED ORDER — PEG-KCL-NACL-NASULF-NA ASC-C 100 G PO SOLR: ORAL | Status: DC

## 2010-08-26 ENCOUNTER — Encounter: Payer: Self-pay | Admitting: Gastroenterology

## 2010-08-27 ENCOUNTER — Telehealth: Payer: Self-pay | Admitting: Internal Medicine

## 2010-08-27 ENCOUNTER — Encounter: Payer: Self-pay | Admitting: Gastroenterology

## 2010-08-27 ENCOUNTER — Ambulatory Visit (AMBULATORY_SURGERY_CENTER): Payer: Medicare Other | Admitting: Gastroenterology

## 2010-08-27 VITALS — BP 146/86 | HR 73 | Temp 97.1°F | Resp 18 | Ht 69.0 in | Wt 197.0 lb

## 2010-08-27 DIAGNOSIS — Z1211 Encounter for screening for malignant neoplasm of colon: Secondary | ICD-10-CM

## 2010-08-27 DIAGNOSIS — R933 Abnormal findings on diagnostic imaging of other parts of digestive tract: Secondary | ICD-10-CM

## 2010-08-27 DIAGNOSIS — K5730 Diverticulosis of large intestine without perforation or abscess without bleeding: Secondary | ICD-10-CM

## 2010-08-27 MED ORDER — SODIUM CHLORIDE 0.9 % IV SOLN: 500.0000 mL | INTRAVENOUS | Status: DC

## 2010-08-27 NOTE — Patient Instructions (Signed)
Follow disharge instructions.  Continue your medications.  Dr. Ardis Hughs office will arrange an appointment for removal of internal anal nodule.  Next colonoscopy in 10 years.

## 2010-08-27 NOTE — Telephone Encounter (Signed)
Pt is needing to get samples of Azor 10-40 mg.

## 2010-08-28 ENCOUNTER — Telehealth: Payer: Self-pay | Admitting: Gastroenterology

## 2010-08-28 ENCOUNTER — Telehealth: Payer: Self-pay | Admitting: *Deleted

## 2010-08-28 MED ORDER — AMLODIPINE-OLMESARTAN 10-40 MG PO TABS: 1.0000 | ORAL_TABLET | Freq: Every day | ORAL | Status: DC

## 2010-08-28 NOTE — Telephone Encounter (Signed)
No mess. Left due to caller id not same last name.

## 2010-08-28 NOTE — Telephone Encounter (Signed)
Samples up front for p/u, pt aware

## 2010-08-28 NOTE — Telephone Encounter (Signed)
Spoke with pt's sister who wanted to give contact numbers for herself so we could give them to the surgeon. Pt's work number from 6:30am to 3:30pm; after 3:30pm, [phone]; home [phone]. Pt has a surgical consult with Dr Fanny Skates on 09/08/10 at 2:30pm for a 3pm appt. Info faxed to [phone]. Notified sister, [name] of appt and mailed directions to her.

## 2010-09-02 NOTE — H&P (Signed)
NAMEBLAIRE, SHANER             ACCOUNT NO.:  [id number]   MEDICAL RECORD NO.:  [id number]          PATIENT TYPE:  INP   LOCATION:  6732                         FACILITY:  [facility]   PHYSICIAN:  Darrick Penna. Swords, MD    DATE OF BIRTH:  11-Sep-1960   DATE OF ADMISSION:  09/17/2006  DATE OF DISCHARGE:                              HISTORY & PHYSICAL   Mr. Prevot is a 50 year old gentleman with multiple medical problems.  Most recently, I saw him on Sep 14, 2006 with a swollen, painful left  foot.  He stated at that time the top of his toes hurt.  At that time,  they were quite erythematous, but there was no skin breakdown, no  drainage, no rash, no obvious trauma to the foot.  At that time, my  working diagnosis was gout.  He was treated with etodolac.  I was  somewhat concerned about a superficial infection as well and cellulitis.  He was treated with Keflex.  Since that time, he has noted increasing  erythema of the left foot.  He has also noted increasing pain.  He has  felt generally unwell.  He has had some cold chills but no documented  temperature.  He was admitted to the hospital for further evaluation and  workup of left lower extremity cellulitis with abscess.   PAST MEDICAL HISTORY:  Significant for a stroke, type 2 diabetes,  hypertension, hyperlipidemia.   CURRENT MEDICATIONS:  1. Metformin 500 mg p.o. b.i.d.  2. Lantus 20 units in the evening, 10 units in the morning.  He also      follows up with sliding-scale insulin when he can remember.  3. Labetalol 200 mg b.i.d.  4. Benicar 40 mg daily.  5. Crestor 10 mg nightly.  6. Hydrochlorothiazide 25 mg p.o. daily.  7. Aspirin 325 daily.  8. Neurontin 600 mg nightly.   SOCIAL HISTORY:  He lives with his sister after the stroke.  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  He is divorced.  He is a smoker.   FAMILY HISTORY:  Noncontributory but there is a family history of  rheumatoid arthritis and  diabetes.   REVIEW OF SYSTEMS:  Completely unremarkable except for that listed  above.   PHYSICAL EXAMINATION:  VITAL SIGNS:  Weight 175, temperature 98.8, pulse  80, respirations 14, blood pressure 140/90.  GENERAL APPEARANCE:  Well-developed and well-nourished male in no acute  distress.  HEENT:  Normocephalic and atraumatic.  Extraocular muscles are intact.  NECK:  Supple without lymphadenopathy.  No thyromegaly, jugular venous  distention, or carotid bruits.  CHEST:  Clear to auscultation without increased work of breathing.  CARDIAC:  S1 and S2 are normal without murmurs, rubs or gallops.  ABDOMEN:  Normoactive bowel sounds, soft, nontender.  There is no  hepatosplenomegaly.  EXTREMITIES:  There is no clubbing, cyanosis or edema.  Left lower  extremity:  There is significant erythema on the top of his left foot.  When the left foot is examined, he now has a punctate lesion on the  bottom of the left foot  with some purulent drainage from that site.  NEUROLOGIC:  He is alert and oriented.  He does have somewhat of a  slurred speech, but that is his normal pattern.   LABORATORIES:  None.  They are ordered in the hospital.   ASSESSMENT/PLAN:  1. Left lower extremity cellulitis with abscess, possible diabetic      foot ulcer.  He clearly needs to be admitted.  Broad-spectrum      antibiotics.  Will treat with Zosyn.  Also, will have orthopedic      consult.  I think he will need an incision and drainage of that      area.  2. Stroke, diabetes, hypertension, hyperlipidemia are also stable.  I      do not think any significant evaluation needs to be done of these      in the hospital.   It is worth noting the patient is allergic to SULFA.  Patient has been  admitted to [facility].  Orthopedic consult has been made.      Bruce Lemmie Evens Swords, MD  Electronically Signed     BHS/MEDQ  D:  09/17/2006  T:  09/17/2006  Job:  [id number]

## 2010-09-02 NOTE — Discharge Summary (Signed)
NAMEESTEVEN, SOLLARS             ACCOUNT NO.:  [id number]   MEDICAL RECORD NO.:  [id number]          PATIENT TYPE:  INP   LOCATION:  6732                         FACILITY:  [facility]   PHYSICIAN:  Valerie A. Asa Lente, MDDATE OF BIRTH:  May 26, 1960   DATE OF ADMISSION:  09/17/2006  DATE OF DISCHARGE:  09/22/2006                               DISCHARGE SUMMARY   DISCHARGE DIAGNOSES:  1. Cellulitis of the left foot secondary to embedded shard of glass.  2. Diabetes, type 2.  3. History of cerebrovascular accident.  4. Hypertension.  5. Dyslipidemia.   HISTORY OF PRESENT ILLNESS:  Mr. Breitkreutz is a 50 year old white male who  was admitted on Sep 17, 2006 due to cellulitis of the left foot.  He  failed outpatient treatment and was admitted for further evaluation.   PAST MEDICAL HISTORY:  1. CVA.  2. Diabetes, type 2.  3. Hypertension.  4. Hyperlipidemia.  5. History of learning disability.   COURSE OF HOSPITALIZATION:  Cellulitis of the left foot.  The patient  was admitted and underwent an MRI of the left foot which noted  cellulitis and myositis of the forefoot.  There was no discrete soft  tissue abscess and no definite osteomyelitis noted.  The patient was  placed on IV Zosyn on admission and he was noted to have continued  improvement in the left lower extremity erythema.  On September 22, 2006, the  patient continued to complain of pain in what appeared to be a punctate  lesion on the bottom of his foot.  Today, a clear shard of glass was  apparent at the entry site which had not been visible on previous exams.  The glass was removed.  The patient was instructed on importance of  wearing shoes at all times.  He has been on Augmentin for the last 24  hours without fever.  We will continue Augmentin for an additional 7  days. Update was provided to the sister and she was instructed to  contact Dr. Leanne Chang should his foot become more swollen, more red, or  should the patient develop  worsening pain or fever over 101.   MEDICATIONS AT TIME OF DISCHARGE:  1. Lantus 10 units subcu in the morning and 20 units subcu in the      evening.  2. Glucophage 500 mg p.o. b.i.d.  3. Labetalol 200 mg p.o. b.i.d.  4. Benicar 40 mg p.o. daily.  5. Crestor 20 mg p.o. daily.  6. Aspirin 325 mg p.o. daily.  7. Augmentin 875 mg p.o. b.i.d. for 7 days.  8. Neurontin 600 mg p.o. daily in the evening.  9. Norvasc 5 mg p.o. daily.  10.Protonix 40 mg p.o. daily.  11.Colace 100 mg p.o. daily in the evening.  12.Hydrochlorothiazide 25 mg p.o. daily.   PERTINENT LABORATORY DATA:  At time of discharge, his hemoglobin A1c is  7.2, BUN 23, creatinine 1, hemoglobin 14.4, hematocrit 42.3.   DISPOSITION:  The patient will be discharged to home.   FOLLOWUP:  The patient is to follow up with Dr. Phoebe Sharps on Monday,  June 9, at  9:15 a.m.      Debbrah Alar, NP      Jannifer Rodney. Asa Lente, MD  Electronically Signed    MO/MEDQ  D:  09/22/2006  T:  09/22/2006  Job:  [id number]   cc:   Darrick Penna. Swords, MD

## 2010-09-05 NOTE — H&P (Signed)
William [name], William [name]             ACCOUNT NO.:  [id number]   MEDICAL RECORD NO.:  [id number]          PATIENT TYPE:  INP   LOCATION:  3113                         FACILITY:  [facility]   PHYSICIAN:  Jonna L. Gwynneth Aliment, M.D.DATE OF BIRTH:  May 04, 1960   DATE OF ADMISSION:  11/09/2004  DATE OF DISCHARGE:                                HISTORY & PHYSICAL   PRIMARY CARE PHYSICIAN:  Ronaldo Miyamoto, M.D.   CHIEF COMPLAINT:  Left arm numb.   HISTORY OF PRESENT ILLNESS:  This 50 year old hypertensive, diabetic white  male quit taking his medication six months ago. Over the last four months,  his wife has noticed the increasing problems with short-term memory loss  over the last two months. He had been having problems with incontinence.  However, he has been complaining of headaches intermittently for which he  frequently takes [facility]. However, he has never had any episodes of focal  numbness, weakness, or difficulty with speaking, although in general he has  been more weak and achy. He was okay at 5 a.m. today. When he woke up at 9  a.m., his left arm was numb. He was having difficulty holding a glass so he  was somewhat weak. He had slurred speech. His wife noticed that the left  side of his face was drooping a little bit and he was brought in to the  emergency room.   ALLERGIES:  SULFA makes him crazy.   MEDICATIONS:  He has been on nothing and does not remember what his previous  medications were.   PAST MEDICAL HISTORY:  1.  Type 2 diabetes:  He has had this for about eight years. He is      complaining of some numbness and tingling in his feet, occasional      burning.  2.  Incontinence for the last couple of months. This occurs during the      daytime as well. He has been having nocturia x3 or 4.  3.  Hypertension:  He has also had this for about the last eight years.   PAST SURGICAL HISTORY:  Broken nose.   FAMILY HISTORY:  Diabetes, heart disease, hypertension, MI's on both sides  of  the family. Smokes one pack per day for 20 years. No alcohol and no  drugs. He works as a Geologist, engineering. He apparently lost a significant amount  of money from building a house, and at that point, he became depressed and  quit taking his medications.   REVIEW OF SYSTEMS:  The patient has lost about 40 pounds in the last six  months and has generalized weakness. His wife states he is having a little  bit of problems with his vision. No sinus problems, breathing trouble, no  shortness of breath or asthma. No chest pain or edema. He has been having  some problems with constipation. A little bit of nausea. He has not noticed  any hematuria. No history of calculi. No prostate disease. No history of  thyroid problems. No unusual bleeding, bruising or skin rashes. He has been  depressed according to his wife.   PHYSICAL  EXAMINATION:  VITAL SIGNS:  Temperature 98.0, pulse 112 (down to  87), respirations 20, blood pressure on admission 213/131. Down to 183/103.  GENERAL:  Well-developed white male with dysarthria and a left facial droop.  HEENT:  Extraocular movements are full. Conjunctivae and lids are normal but  he looks like he might have a slight exophthalmos. Normal hearing, mucosa,  and pharynx.  NECK:  No masses, thyromegaly, or carotid bruits. Respiratory effort is  normal.  LUNGS:  Clear to A&P without wheezing, rales, rhonchi, or dullness.  HEART:  Regular rate and rhythm. Normal S1 and S2 without murmurs, gallops,  or rubs. There were no bruits.  EXTREMITIES:  No clubbing, cyanosis, or edema.  ABDOMEN:  Nontender. Normal bowel sounds. No hepatosplenomegaly or hernias.  Normal scrotum and penis. No inguinal or cervical adenopathy.  NEUROLOGICAL:  Muscle strength in the right upper extremity and both legs  are normal. The left is about a 5-/5. There is a slightly abnormal left  finger to nose. He has full range of motion. Cranial nerves: Extraocular  movements are normal. The tongue  deviates slightly to the left. There is a  left facial droop. He is hyperreflexic in the left arm, slightly in the left  leg. Sensation is decreased in the left hand and both feet. The left toe  goes slightly up. The right toe goes down. He is alert and oriented x3. He  does have some slight short-term memory loss. Anxious affect. Probably poor  judgment in general.  SKIN:  No skin rashes, lesions or nodules.   LABORATORY DATA:  White count 11.6, hemoglobin up to 18, 78% neutrophils.  INR is 1.0. First set of cardiac enzymes negative. Urinalysis shows  proteinuria and glucosuria. Albumin 3.2. Normal LFT's. CT of the head shows  old bilateral vasoganglia CVA, nothing new.   IMPRESSION:  1.  New right cerebrovascular accident with mild left hemiparesis versus      some element of hypertensive encephalopathy:  We will get a MRI scan,      start him on aspirin and Plavix. Arrange for him to get speech physical      therapy and occupational therapy.  2.  Uncontrolled diabetes:  Will be put on sliding scale and started back on      medications.  3.  Uncontrolled hypertension:  We will give him some intravenous labetalol      and start him back on medications.  4.  Proteinuria:  I will evaluate him for diabetic nephropathy.  5.  Smoker.  6.  Incontinence.  7.  Check a PSA:  This should not come just from polyuria and polydipsia.      May represent abnormal functioning from previous CVA's.  8.  Depression:  I am going to start him on Cymbalta which may help not only      his depression but his early peripheral neuropathy.  9.  Diabetic peripheral neuropathy.       JLB/MEDQ  D:  11/09/2004  T:  11/10/2004  Job:  [id number]   cc:   Ronaldo Miyamoto, M.D.  [address] [place]  [place] [address]  Fax: [phone]

## 2010-09-05 NOTE — Discharge Summary (Signed)
William [name], BOBST NO.:  [id number]   MEDICAL RECORD NO.:  [id number]          PATIENT TYPE:  IPS   LOCATION:  0306                          FACILITY:  [facility]   PHYSICIAN:  Norm Salt, MD  DATE OF BIRTH:  1960-08-13   DATE OF ADMISSION:  12/24/2004  DATE OF DISCHARGE:  12/27/2004                                 DISCHARGE SUMMARY   IDENTIFYING DATA AND REASON FOR ADMISSION:  This was the first [facility] admission  for William [name], a 50 year old married white male, who was admitted on a  voluntary basis.  This was his first [facility] admission.  He was admitted due to  concerns about drastic mood changes following a trial of the antidepressant  Cymbalta.  He became increasingly agitated and irritable.  His wife insisted  on an inpatient evaluation.   The patient came to [place] with a history of right-sided CVA in June 2006 with  resultant mild left hemiplegia.  Please refer to the admission note for  further details pertaining to the symptoms, circumstances and history that  lead to his hospitalization at [facility].   The patient came to [place] as a patient of Dr. Wilson Singer.  He had been taking  Cymbalta 60 mg daily.   MEDICAL AND LABORATORY:  The patient had a history of hypertension, status  post CVA, and insulin-dependent diabetes mellitus.  Admission laboratory  included a chemistry panel which was within normal limits with the exception  of elevated glucose at 274 and BUN elevated at 24.  ALP was elevated at 123.  TSH level was within normal limits.   HOSPITAL COURSE:  The patient was admitted to the adult inpatient  psychiatric service where he presented as a pleasant, cooperative and alert  individual with clear sensorium and no signs or symptoms of psychosis,  delirium or delusionality.  He denied suicidal and homicidal ideation.  He  agreed to be in the inpatient program for evaluation.  His mood was  essentially neutral, but he was easily tearful.  The impression was that of  substance-induced mood disorder and possibly affective instability following  his stroke.   Cymbalta was not given out of suspicion that it was aggravating his  presenting symptoms.   He continued in the program and was generally pleasant, affable and not  irritable.  The only psychotropic he received was Ambien 5 mg p.r.n.  insomnia.   The patient was given his usual daily aspirin, Vasotec, Amaryl,  hydrochlorothiazide, Lantus insulin, Metformin and labetalol.   AFTERCARE:  The patient was discharged in good spirits after a family  conference.  He was to follow up with Dr. Wilson Singer at [facility] on December 29, 2004 at 2:30 p.m. and at the [facility]  intensive outpatient program on December 29, 2004 from 9:00 a.m. to 12:00  noon.   The patient was instructed to take his same schedule of non-psychotropic  medications, and was instructed not to take any further Cymbalta.  He was  not prescribed any psychotropics at the time of discharge.   DISCHARGE DIAGNOSES:  AXIS I:  Mood disorder, not  otherwise specified.  AXIS II:  Deferred.  AXIS III:  Multiple medical problems.  AXIS IV:  Stressors severe.  AXIS V:  Global assessment of function on discharge 65.           ______________________________  Norm Salt, MD  Electronically Signed     SPB/MEDQ  D:  01/13/2005  T:  01/13/2005  Job:  [id number]

## 2010-09-05 NOTE — Consult Note (Signed)
[name], [name] NO.:  [id number]   MEDICAL RECORD NO.:  [id number]          PATIENT TYPE:  INP   LOCATION:  3113                         FACILITY:  [place]   PHYSICIAN:  William [name], M.D.DATE OF BIRTH:  Dec 11, 1960   DATE OF CONSULTATION:  11/10/2004  DATE OF DISCHARGE:                                   CONSULTATION   CHIEF COMPLAINT:  Left-sided weakness and numbness.   HISTORY OF PRESENT ILLNESS:  Mr. William [name] is a 50 year old right-handed white  man with a history of untreated hypertension and diabetes.  Two days ago he  noted left arm numbness, yesterday left arm and face weakness.  He was  admitted with suspected stroke.  MRI is positive for an acute right  posterior limb internal capsule stroke on diffusion weighted images.   REVIEW OF SYSTEMS:  He denies any vision changes, cognitive problems, and  coordination.  No headache now, but does have headaches from time to time.  No chest pain or shortness of breath.   PAST MEDICAL HISTORY:  Significant for hypertension and diabetes.  He does  not take any medications for them.  Prior to admission the only medicine he  would take would be p.r.n. BC powders.   CURRENT MEDICATIONS:  Labetalol, Plavix, Protonix, aspirin, Vasotec, Toprol,  Amaryl, Glucophage, Cymbalta, Senokot, NovoLog, Pepcid, Zofran, Tylenol,  Ambien.   ALLERGIES:  SULFA.   SOCIAL HISTORY:  He is married.  No children.  Does smoke.  Drives a tractor  for a living.   FAMILY HISTORY:  Noncontributory.   PHYSICAL EXAMINATION:  VITAL SIGNS:  Temperature 98.5, pulse 75, blood  pressure 190/95, respirations 18, 97% saturation.  HEENT:  Normocephalic, atraumatic.  NECK:  Supple without bruits.  HEART:  Regular rate and rhythm.  NEUROLOGIC:  Mental status.  He is awake and alert.  He is fully oriented  and has normal naming, repetition, and comprehension.  Cranial nerves:  Pupils are equal and reactive.  Visual fields are full.   Extraocular  movements are intact.  Facial sensation is normal.  Facial motor activity  shows he has a left central facial droop.  Hearing is intact.  Palate is  symmetric.  Tongue is midline.  He had some mild dysarthria probably due to  the facial weakness.  Motor examination:  He has normal bulk, tone, and  strength in the right upper extremity and bilateral lower extremities with  positive drift in the right upper extremity and 4+/5 strength.  Clumsiness  is noted as well.  Deep tendon reflexes are absent diffusely, downgoing toes  bilaterally.  Coordination:  Left finger to nose is clumsy, finger to nose  on the right and bilateral heel to shin are normal.  Sensory examination is  decreased in the left upper extremity.   CT of the brain shows nothing acute.  MRI of the brain shows an acute right  posterior limb internal capsule infarct as well as mild chronic small vessel  changes.  MRA shows nonvisualization of the left vertebral artery and  questionable left PCA plaque or stenosis.   IMPRESSION:  Small  vessel lacunar infarct, right posterior limb internal  capsule with left face and arm weakness and numbness likely secondary to  uncontrolled diabetes and hypertension on a small vessel basis.   RECOMMENDATIONS:  I would select either aspirin or Plavix rather than both.  Agree with completion of stroke work-up with 2-D echocardiogram and carotid  Doppler.  He needs PT, OT, ST, and rehabilitation assessment.  Needs lipid  panel, homocysteine. Stroke service will follow.       CAW/MEDQ  D:  11/10/2004  T:  11/10/2004  Job:  [id number]

## 2010-09-05 NOTE — Letter (Signed)
November 16, 2005      RE:  ALMAN, NULL  MRN:  [id number]  /  DOB:  1961-02-03   To Whom It May Concern:   Mr. Stpierre has been a long-term patient of mine.  He has had long-term  learning disability, as characterized by his illiteracy.  He has always  required help with management of his finances, either from family members or  his wife.  It is my understanding that there is some question of his tax  liability.  It is my impression that Mr. Buntain did not understand the  finances around his taxes of 2005.  He has had a stroke since that time, and  also has chronic problems with diabetes, hypertension and hyperlipidemia.   If you have any questions, please contact me.    Sincerely,      Bruce H. Swords, MD   BHS/MedQ  DD:  11/16/2005  DT:  11/16/2005  Job #:  [id number]

## 2010-09-05 NOTE — Discharge Summary (Signed)
William [name], [name]             ACCOUNT NO.:  [id number]   MEDICAL RECORD NO.:  [id number]          PATIENT TYPE:  INP   LOCATION:  3036                         FACILITY:  [place]   PHYSICIAN:  Mobolaji B. Bakare, M.D.DATE OF BIRTH:  30-Jan-1961   DATE OF ADMISSION:  11/09/2004  DATE OF DISCHARGE:  11/13/2004                                 DISCHARGE SUMMARY   CONSULTATIONS:  Dr. Jacolyn Reedy, Neurologist.   FINAL DIAGNOSES:  1.  Right cerebrovascular accident with mild left hemiparesis.  2.  Controlled hypertension.  3.  Uncontrolled diabetes mellitus.  4.  Depression.  5.  Diabetic nephropathy.  6.  Diabetic neuropathy.  7.  Dyslipidemia.   HISTORY OF PRESENT ILLNESS:  Please refer to the admission history and  physical. In brief, William [name] is a 50 year old Caucasian male with history  of hypertension and diabetes. He has been noncompliant with his medications  secondary to financial limitations. He presented to the emergency department  with left sided facial weakness and weakness also involving the upper and  lower limbs, numbness. He presented more than 6 hours from the time of  inset. He had difficultly holding a glass of water and he also developed  slurred speech.   PROCEDURE:  1.  A head CT scan showed small old bilateral basal ganglia lacunar infarct      and prominent perivascular spaces.  2.  Chest x-ray, no acute cardiopulmonary disease. Bilateral degenerative      changes involving both shoulders.  3.  MRI of the brain showed acute infarct posteriorly of the internal      capsule on the right.  Mild chronic small vessel ischemic changes in the      white matter.  4.  MRA showed moderate stenosis left posterior cerebral artery.      Nonvisualized left distal cerebral artery.  5.  Carotid Doppler. There was no significant internal carotid artery      stenosis bilaterally.  Blood flow is antegrade. Left vertebral artery      not visualized.  6.  A 2-D  echocardiogram did not show any true mass. The ejection fraction      was 55% to 65% and normal left ventricular systolic function.  7.  Fiberoptic esophageal studies by speech therapist.   PHYSICAL EXAMINATION:  VITAL SIGNS:  On arrival, his blood pressure was  213/121 with a pulse rate of 102, respiratory rate of 20.  GENERAL:  On examination, he was a well-developed male with dysarthria and  left facial droop. He had significant pronator drift on the left.  LUNGS:  Lungs were clear clinically. CVA unremarkable. There was no carotid  bruit.  ABDOMEN:  Abdominal examination was unremarkable.  EXTREMITIES:  He had significant weakness of the left upper limb. Muscle  power left Upper limb 4-/5, and left lower limb is 4+/5.  NEUROLOGIC:  The tongue was slightly deviated to the left. Extraocular  muscles movements were intact.   HOSPITAL COURSE:  #1.  William [name] was seen in consultation by Dr. Jacolyn Reedy,  Neurologist and the impression is a small vessel lacunar infarct involving  right posterior limb internal capsule with left face and arm weakness and  numbness. Is likely secondary to uncontrolled diabetes and hypertension.  From the stoke standpoint, the patient was seen by physical and occupational  therapies and he was started on active rehabilitation. He was also evaluated  by speech therapy and it was determined that he has mild to moderate  dysphagia with positive aspiration of thin liquids without a non-productive  cough. It was recommended to have aspiration precautions and oral  supervision during feeding, to eat and drink when allowed, to take small  bites. The neurological deficits was mostly pronounced in the left upper  limb. William [name] was able to walk without assistance. 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 The patient needs some improvement and it was  felt that it would be okay to  continue home health PT, OT and speech  therapy.   #2.  Diabetes mellitus. The glucose was uncontrolled at the time of  admission. Hemoglobin A1C was 18.1. The patient was started on Amaryl and  Glucophage. Lantus insulin was also added. At the time of discharge his  blood glucose was better controlled and medications can be optimized as  outpatient.   #3.  Hypertension. This was controlled with multiple medications and at the  time of discharge, blood pressure was 149/89.   #4.  Dyslipidemia. HDL was 28 and LDL was 64 with a triglyceride of 173 and  total cholesterol of 127. He was advised to continue exercise and quit  smoking to help improve his HDL.   #5.  Diabetic nephropathy. A 24 hour urine collection showed protein of 638.  The patient was started on ace inhibitor.   #6.  Diabetic nephropathy and depression. The patient was started on  Cymbalta. Of note, William [name] recently lost his home and he is undergoing a  lot of social issues and financial pressure and that is the reason for him  not being able to afford co-pays for his medications. He was seen by Research officer, political party and they recommended he should followup with a Development worker, community.  He was given referral as to possible resources.   #7.  Tobacco abuse. He is quitting smoking and he declines any nicotine  patch to help him, which I quite agree with.   DISCHARGE MEDICATIONS:  1.  Aspirin 325 mg once a day.  2.  Cymbalta 60 mg once a day.  3.  Vasotec 20 mg b.i.d.  4.  Amaryl 4 mg once a day.  5.  Hydrochlorthiazide 25 mg once a day.  6.  Lantus insulin 20 units subcutaneously q. day.  7.  Labetalol 400 mg two times a day.  8.  Metformin 500 mg two times a day.   DIET:  Low-carbohydrate, low-fat, and low-salt diet.   DIET:  Thin liquid diet with small sips.   FOLLOWUP:  The patient is to followup with Dr. Wilson Singer in 1 weeks.   ACTIVITY:  He was advised on regular exercise. Home health PT, OT, and speech therapy  will follow with the patient, to continue rehabilitation.      Mobolaji B. Maia Petties, M.D.  Electronically Signed     MBB/MEDQ  D:  11/16/2004  T:  11/16/2004  Job:  [id number]   cc:   Ronaldo Miyamoto, M.D.  [address]  [place] [address]  Fax:  [place] Jacolyn Reedy, M.D.  Fax: [phone]

## 2010-09-19 ENCOUNTER — Other Ambulatory Visit (INDEPENDENT_AMBULATORY_CARE_PROVIDER_SITE_OTHER): Payer: Self-pay | Admitting: General Surgery

## 2010-09-19 ENCOUNTER — Encounter (HOSPITAL_COMMUNITY)
Admission: RE | Admit: 2010-09-19 | Discharge: 2010-09-19 | Disposition: A | Discharge status: Home / Self Care | Payer: Medicare Other | Source: Ambulatory Visit | Attending: General Surgery | Admitting: General Surgery

## 2010-09-19 ENCOUNTER — Ambulatory Visit (HOSPITAL_COMMUNITY)
Admission: RE | Admit: 2010-09-19 | Discharge: 2010-09-19 | Disposition: A | Discharge status: Home / Self Care | Payer: Medicare Other | Source: Ambulatory Visit | Attending: General Surgery | Admitting: General Surgery

## 2010-09-19 DIAGNOSIS — Z01810 Encounter for preprocedural cardiovascular examination: Secondary | ICD-10-CM | POA: Insufficient documentation

## 2010-09-19 DIAGNOSIS — I10 Essential (primary) hypertension: Secondary | ICD-10-CM | POA: Insufficient documentation

## 2010-09-19 DIAGNOSIS — Z01812 Encounter for preprocedural laboratory examination: Secondary | ICD-10-CM | POA: Insufficient documentation

## 2010-09-19 DIAGNOSIS — Z01818 Encounter for other preprocedural examination: Secondary | ICD-10-CM | POA: Insufficient documentation

## 2010-09-19 DIAGNOSIS — K621 Rectal polyp: Secondary | ICD-10-CM

## 2010-09-19 DIAGNOSIS — F17200 Nicotine dependence, unspecified, uncomplicated: Secondary | ICD-10-CM | POA: Insufficient documentation

## 2010-09-19 DIAGNOSIS — K620 Anal polyp: Secondary | ICD-10-CM | POA: Insufficient documentation

## 2010-09-19 LAB — URINALYSIS, ROUTINE W REFLEX MICROSCOPIC
Leukocytes, UA: NEGATIVE
Protein, ur: 100 mg/dL — AB
Specific Gravity, Urine: 1.018 (ref 1.005–1.030)
Urobilinogen, UA: 0.2 mg/dL (ref 0.0–1.0)

## 2010-09-19 LAB — DIFFERENTIAL
Basophils Absolute: 0 10*3/uL (ref 0.0–0.1)
Basophils Relative: 0 % (ref 0–1)
Eosinophils Relative: 5 % (ref 0–5)
Lymphocytes Relative: 17 % (ref 12–46)
Monocytes Absolute: 0.7 10*3/uL (ref 0.1–1.0)
Neutro Abs: 9 10*3/uL — ABNORMAL HIGH (ref 1.7–7.7)

## 2010-09-19 LAB — COMPREHENSIVE METABOLIC PANEL
ALT: 19 U/L (ref 0–53)
AST: 17 U/L (ref 0–37)
Albumin: 3.7 g/dL (ref 3.5–5.2)
Alkaline Phosphatase: 117 U/L (ref 39–117)
BUN: 32 mg/dL — ABNORMAL HIGH (ref 6–23)
Chloride: 98 mEq/L (ref 96–112)
GFR calc Af Amer: >60 mL/min (ref >60)
Potassium: 4.8 mEq/L (ref 3.5–5.1)
Sodium: 138 mEq/L (ref 135–145)
Total Bilirubin: 0.2 mg/dL — ABNORMAL LOW (ref 0.3–1.2)

## 2010-09-19 LAB — URINE MICROSCOPIC-ADD ON

## 2010-09-19 LAB — CBC
HCT: 41.7 % (ref 39.0–52.0)
Hemoglobin: 15.1 g/dL (ref 13.0–17.0)
MCHC: 36.2 g/dL — ABNORMAL HIGH (ref 30.0–36.0)
RDW: 12.2 % (ref 11.5–15.5)
WBC: 12.4 10*3/uL — ABNORMAL HIGH (ref 4.0–10.5)

## 2010-09-22 ENCOUNTER — Telehealth: Payer: Self-pay | Admitting: *Deleted

## 2010-09-22 NOTE — Telephone Encounter (Signed)
Wanted Dr. Leanne Chang to know that pt's glucose was 248 yesterday.  His surgery for excision anal polyp is still on for the 8th of June.

## 2010-09-22 NOTE — Telephone Encounter (Signed)
Ok to get procedure with these CBGs

## 2010-09-24 ENCOUNTER — Other Ambulatory Visit: Payer: Medicare Other

## 2010-09-26 ENCOUNTER — Other Ambulatory Visit (INDEPENDENT_AMBULATORY_CARE_PROVIDER_SITE_OTHER): Payer: Self-pay | Admitting: General Surgery

## 2010-09-26 ENCOUNTER — Ambulatory Visit (HOSPITAL_COMMUNITY)
Admission: RE | Admit: 2010-09-26 | Discharge: 2010-09-26 | Disposition: A | Discharge status: Home / Self Care | Payer: Medicare Other | Source: Ambulatory Visit | Attending: General Surgery | Admitting: General Surgery

## 2010-09-26 DIAGNOSIS — Z01812 Encounter for preprocedural laboratory examination: Secondary | ICD-10-CM | POA: Insufficient documentation

## 2010-09-26 DIAGNOSIS — K621 Rectal polyp: Secondary | ICD-10-CM | POA: Insufficient documentation

## 2010-09-26 DIAGNOSIS — E785 Hyperlipidemia, unspecified: Secondary | ICD-10-CM | POA: Insufficient documentation

## 2010-09-26 DIAGNOSIS — E119 Type 2 diabetes mellitus without complications: Secondary | ICD-10-CM | POA: Insufficient documentation

## 2010-09-26 DIAGNOSIS — K644 Residual hemorrhoidal skin tags: Secondary | ICD-10-CM | POA: Insufficient documentation

## 2010-09-26 DIAGNOSIS — Z8673 Personal history of transient ischemic attack (TIA), and cerebral infarction without residual deficits: Secondary | ICD-10-CM | POA: Insufficient documentation

## 2010-09-26 DIAGNOSIS — I10 Essential (primary) hypertension: Secondary | ICD-10-CM | POA: Insufficient documentation

## 2010-09-26 DIAGNOSIS — Z01810 Encounter for preprocedural cardiovascular examination: Secondary | ICD-10-CM | POA: Insufficient documentation

## 2010-09-26 DIAGNOSIS — K620 Anal polyp: Secondary | ICD-10-CM | POA: Insufficient documentation

## 2010-09-26 DIAGNOSIS — Z01818 Encounter for other preprocedural examination: Secondary | ICD-10-CM | POA: Insufficient documentation

## 2010-10-01 ENCOUNTER — Ambulatory Visit: Payer: Medicare Other | Admitting: Internal Medicine

## 2010-10-02 ENCOUNTER — Other Ambulatory Visit: Payer: Self-pay | Admitting: Internal Medicine

## 2010-10-02 NOTE — Op Note (Signed)
  NAMEJOAO, KINGMAN NO.:  [id number]  MEDICAL RECORD NO.:  [id number]  LOCATION:  SDSC                         FACILITY:  [place]  PHYSICIAN:  Edsel Petrin. Dalbert Batman, M.D.DATE OF BIRTH:  April 27, 1960  DATE OF PROCEDURE:  09/26/2010 DATE OF DISCHARGE:  09/26/2010                              OPERATIVE REPORT   PREOPERATIVE DIAGNOSES: 1. Rectal polyp. 2. External hemorrhoids.  POSTOPERATIVE DIAGNOSES: 1. Rectal polyp. 2. External hemorrhoids.  OPERATION PERFORMED: 1. Examination under anesthesia and transanal excision of rectal     polyp. 2. External hemorrhoidectomy, single-column.  SURGEON:  Edsel Petrin. Dalbert Batman, MD  OPERATIVE INDICATIONS:  This is a 50 year old Caucasian gentleman with a history of cerebrovascular accident in 2006, diabetes, hypertension, hyperlipidemia.  He recently had a screening colonoscopy which was mostly unremarkable.  There was an anal canal or distal rectal nodule that could not be excised colonoscopically and he was referred.  On exam, he has what looks like a partially pedunculated polyp in the anal canal posteriorly.  Externally, he has moderate size external hemorrhoid on the right posterior position.  Otherwise, no other major pathology. He is brought to the operating room for excision.  OPERATIVE TECHNIQUE:  Following the induction of general endotracheal anesthesia, the patient was placed in the lithotomy position in rigid padded stirrups.  Perianal area was prepped and draped in sterile fashion.  Surgical time-out was held identifying the correct patient and correct procedure.  Marcaine 0.5% with epinephrine was used as a local infiltration anesthetic.  I gently dilated the anal canal.  I observed a somewhat pedunculated polypoid mass in the distal rectum about 1 or 2 cm above the dentate line.  This was just to the right of the posterior midline.  There were minimal other internal abnormalities.  Externally, he had  moderate sized external hemorrhoid in the right posterior position.  I positioned the anoscope and after injecting further local anesthesia, I placed a transfixion suture of 2-0 chromic above the rectal polyp.  I then used electrocautery to excise the polyp.  I then used electrocautery to excise the external hemorrhoid.  I did this under traction so I could clearly see and preserve the internal sphincter muscles.  Specimens were sent for routine histology.  Hemostasis was excellent and achieved with electrocautery.  I closed the mucosa, the dentate line and a little bit of the anoderm with a running suture of 2- 0 chromic.  This was observed for about 5 minutes and there was no bleeding.  I placed some Dibucaine ointment externally and external pad and the procedure was completed.  The patient tolerated the procedure well and was taken to recovery room in stable condition.  Estimated blood loss was about 10 mL.  Complications none.  Sponge, needle and instrument counts were correct.     Edsel Petrin. Dalbert Batman, M.D.     HMI/MEDQ  D:  09/26/2010  T:  09/26/2010  Job:  [id number]  cc:   Darrick Penna. Swords, MD Milus Banister, MD  Electronically Signed by Fanny Skates M.D. on 10/02/2010 05:25:02 PM

## 2010-10-10 ENCOUNTER — Encounter (INDEPENDENT_AMBULATORY_CARE_PROVIDER_SITE_OTHER): Payer: Self-pay | Admitting: General Surgery

## 2010-10-16 ENCOUNTER — Encounter (INDEPENDENT_AMBULATORY_CARE_PROVIDER_SITE_OTHER): Payer: Self-pay | Admitting: General Surgery

## 2010-10-16 ENCOUNTER — Ambulatory Visit (INDEPENDENT_AMBULATORY_CARE_PROVIDER_SITE_OTHER): Payer: Medicare Other | Admitting: General Surgery

## 2010-10-16 VITALS — BP 142/84 | HR 70 | Temp 97.9°F | Resp 16 | Ht 70.0 in | Wt 190.6 lb

## 2010-10-16 DIAGNOSIS — D129 Benign neoplasm of anus and anal canal: Secondary | ICD-10-CM

## 2010-10-16 DIAGNOSIS — D128 Benign neoplasm of rectum: Secondary | ICD-10-CM

## 2010-10-16 NOTE — Progress Notes (Signed)
Subjective:     Patient ID: William [name], male   DOB: Jan 26, 1961, 50 y.o.   MRN: [id number]    BP 142/84  Pulse 70  Temp(Src) 97.9 F (36.6 C) (Temporal)  Resp 16  Ht 5\' 10"  (1.778 m)  Wt 190 lb 9.6 oz (86.456 kg)  BMI 27.35 kg/m2    HPI William [name] underwent examination under anesthesia and transanal excision of rectal polyp and external hemorrhoidectomy on September 26, 2010. He had a little bit of pain for a few days but that resolved. He now is having normal bowel movements without any pain or bleeding. His final pathology report shows a benign squamous papilloma with viropathic changes. There is no evidence of malignancy. I gave him a copy of his pathology report. Review of Systems     Objective:   Physical Exam Perianal tissues looked fine. There is no evidence of any drainage or infection. Sphincter tone is normal. There is no tenderness. The wound appears to be well-healed.   Assessment:     Benign squamous papilloma with viroopathic changes, anal canal. Recovering uneventfully following excision.   Plan:        1. Continue medical followup with Dr. Phoebe Sharps and with Dr. Oretha Caprice. 2. Return to see me p.r.n.

## 2010-10-30 ENCOUNTER — Other Ambulatory Visit: Payer: Self-pay | Admitting: Internal Medicine

## 2010-10-30 ENCOUNTER — Other Ambulatory Visit (INDEPENDENT_AMBULATORY_CARE_PROVIDER_SITE_OTHER): Payer: Medicare Other

## 2010-10-30 DIAGNOSIS — I10 Essential (primary) hypertension: Secondary | ICD-10-CM

## 2010-10-30 DIAGNOSIS — E785 Hyperlipidemia, unspecified: Secondary | ICD-10-CM

## 2010-10-30 DIAGNOSIS — E119 Type 2 diabetes mellitus without complications: Secondary | ICD-10-CM

## 2010-10-30 LAB — BASIC METABOLIC PANEL
BUN: 23 mg/dL (ref 6–23)
CO2: 30 mEq/L (ref 19–32)
Chloride: 99 mEq/L (ref 96–112)
Glucose, Bld: 200 mg/dL — ABNORMAL HIGH (ref 70–99)
Potassium: 4.5 mEq/L (ref 3.5–5.1)
Sodium: 141 mEq/L (ref 135–145)

## 2010-10-30 LAB — HEPATIC FUNCTION PANEL
AST: 22 U/L (ref 0–37)
Albumin: 4 g/dL (ref 3.5–5.2)
Alkaline Phosphatase: 96 U/L (ref 39–117)
Bilirubin, Direct: 0 mg/dL (ref 0.0–0.3)
Total Protein: 7.2 g/dL (ref 6.0–8.3)

## 2010-10-30 LAB — LDL CHOLESTEROL, DIRECT: Direct LDL: 90.7 mg/dL

## 2010-11-12 ENCOUNTER — Ambulatory Visit: Payer: Medicare Other | Admitting: Internal Medicine

## 2010-12-03 NOTE — Progress Notes (Signed)
Addended by: Ivar Bury ANN on: 12/03/2010 04:48 PM   Modules accepted: Orders, Medications, Level of Service

## 2010-12-25 ENCOUNTER — Ambulatory Visit: Payer: Medicare Other | Admitting: Internal Medicine

## 2010-12-25 ENCOUNTER — Encounter: Payer: Self-pay | Admitting: Internal Medicine

## 2010-12-25 ENCOUNTER — Ambulatory Visit (INDEPENDENT_AMBULATORY_CARE_PROVIDER_SITE_OTHER): Payer: Medicare Other | Admitting: Internal Medicine

## 2010-12-25 VITALS — BP 122/82 | HR 68 | Temp 98.3°F | Ht 69.0 in | Wt 192.0 lb

## 2010-12-25 DIAGNOSIS — I10 Essential (primary) hypertension: Secondary | ICD-10-CM

## 2010-12-25 DIAGNOSIS — E785 Hyperlipidemia, unspecified: Secondary | ICD-10-CM

## 2010-12-25 DIAGNOSIS — Z23 Encounter for immunization: Secondary | ICD-10-CM

## 2010-12-25 DIAGNOSIS — E119 Type 2 diabetes mellitus without complications: Secondary | ICD-10-CM

## 2010-12-25 MED ORDER — INSULIN GLARGINE 100 UNIT/ML ~~LOC~~ SOLN: 25.0000 [IU] | Freq: Two times a day (BID) | SUBCUTANEOUS | Status: DC

## 2010-12-25 NOTE — Progress Notes (Signed)
  Subjective:    Patient ID: William [name], male    DOB: 1960-11-30, 50 y.o.   MRN: [id number]  HPI   patient comes in for followup of multiple medical problems including type 2 diabetes, hyperlipidemia, hypertension. The patient does not check blood sugar or blood pressure at home. The patetient does not follow an exercise or diet program. The patient denies any polyuria, polydipsia.  In the past the patient has gone to diabetic treatment center. The patient is tolerating medications  Without difficulty. The patient does admit to medication compliance.   Past Medical History  Diagnosis Date  . DIABETES MELLITUS, TYPE II 09/28/2006  . HYPERLIPIDEMIA 09/28/2006  . HYPERTENSION 09/28/2006  . CEREBROVASCULAR ACCIDENT, HX OF 09/28/2006    2007 affected left side and peripheral vision  . GERD (gastroesophageal reflux disease)   . Stroke    Past Surgical History  Procedure Date  . Rhinoplasty   . Lymphadenectomy   . Sigmoidoscopy     reports that he has been smoking Cigarettes.  He has been smoking about 1 pack per day. He uses smokeless tobacco. He reports that he does not drink alcohol or use illicit drugs. family history includes Alcohol abuse in his brother; Diabetes in his brother and father; Heart attack in his sister; Lung cancer in his mother; and Stroke in his father. Allergies  Allergen Reactions  . Sulfamethoxazole     REACTION: rash     Review of Systems  patient denies chest pain, shortness of breath, orthopnea. Denies lower extremity edema, abdominal pain, change in appetite, change in bowel movements. Patient denies rashes, musculoskeletal complaints. No other specific complaints in a complete review of systems.      Objective:   Physical Exam  well-developed well-nourished male in no acute distress. HEENT exam atraumatic, normocephalic, neck supple without jugular venous distention. Chest clear to auscultation cardiac exam S1-S2 are regular. Abdominal exam overweight  with bowel sounds, soft and nontender. Extremities no edema. Neurologic exam is alert with a normal gait.        Assessment & Plan:

## 2010-12-25 NOTE — Assessment & Plan Note (Signed)
Poor control by history---cbgs averaging 200 Increase lantus

## 2010-12-28 NOTE — Assessment & Plan Note (Signed)
Adequate control. Continue current medications.

## 2010-12-28 NOTE — Assessment & Plan Note (Signed)
BP Readings from Last 3 Encounters:  12/25/10 122/82  10/16/10 142/84  08/27/10 146/86   Well-controlled. Continue current medications.

## 2011-01-02 ENCOUNTER — Other Ambulatory Visit: Payer: Self-pay | Admitting: Internal Medicine

## 2011-01-02 ENCOUNTER — Telehealth: Payer: Self-pay | Admitting: Internal Medicine

## 2011-01-02 DIAGNOSIS — I10 Essential (primary) hypertension: Secondary | ICD-10-CM

## 2011-01-02 MED ORDER — HYDROCHLOROTHIAZIDE 25 MG PO TABS: 25.0000 mg | ORAL_TABLET | Freq: Every day | ORAL | Status: DC

## 2011-01-02 MED ORDER — METFORMIN HCL 500 MG PO TABS: 500.0000 mg | ORAL_TABLET | Freq: Two times a day (BID) | ORAL | Status: DC

## 2011-01-02 NOTE — Telephone Encounter (Signed)
Pt would like a refill on his Metformin. States this has been faxed to [place] x2 already, with no response. Pt is now out of this Rx. Please [facility] [place] off [address].

## 2011-01-02 NOTE — Telephone Encounter (Signed)
rx sent in electronically 

## 2011-02-02 ENCOUNTER — Telehealth: Payer: Self-pay | Admitting: *Deleted

## 2011-02-02 MED ORDER — AMLODIPINE-OLMESARTAN 10-40 MG PO TABS: 1.0000 | ORAL_TABLET | Freq: Every day | ORAL | Status: DC

## 2011-02-02 NOTE — Telephone Encounter (Signed)
Pt is asking for samples of Azor.  Please call if we have any.

## 2011-02-02 NOTE — Telephone Encounter (Signed)
rx up front ready for p/u, pt aware 

## 2011-02-05 LAB — CBC
HCT: 41.9
HCT: 42.3
Hemoglobin: 14.3
Hemoglobin: 14.4
MCHC: 34
MCHC: 34.1
MCV: 95.6
MCV: 95.8
Platelets: 235
Platelets: 248
RBC: 4.38
RBC: 4.42
RDW: 11.7
RDW: 11.8
WBC: 13.5 — ABNORMAL HIGH
WBC: 15.8 — ABNORMAL HIGH

## 2011-02-05 LAB — HEMOGLOBIN A1C: Hgb A1c MFr Bld: 7.2 — ABNORMAL HIGH

## 2011-02-11 ENCOUNTER — Ambulatory Visit (INDEPENDENT_AMBULATORY_CARE_PROVIDER_SITE_OTHER)
Admission: RE | Admit: 2011-02-11 | Discharge: 2011-02-11 | Disposition: A | Discharge status: Home / Self Care | Payer: Medicare Other | Source: Ambulatory Visit | Attending: Internal Medicine | Admitting: Internal Medicine

## 2011-02-11 ENCOUNTER — Ambulatory Visit (INDEPENDENT_AMBULATORY_CARE_PROVIDER_SITE_OTHER): Payer: Medicare Other | Admitting: Internal Medicine

## 2011-02-11 ENCOUNTER — Encounter: Payer: Self-pay | Admitting: Internal Medicine

## 2011-02-11 VITALS — BP 122/74 | Temp 97.8°F | Wt 198.0 lb

## 2011-02-11 DIAGNOSIS — M79605 Pain in left leg: Secondary | ICD-10-CM

## 2011-02-11 DIAGNOSIS — M79609 Pain in unspecified limb: Secondary | ICD-10-CM

## 2011-02-13 ENCOUNTER — Other Ambulatory Visit: Payer: Self-pay | Admitting: Internal Medicine

## 2011-02-13 DIAGNOSIS — M79605 Pain in left leg: Secondary | ICD-10-CM

## 2011-02-13 NOTE — Progress Notes (Signed)
  Subjective:    Patient ID: William [name], male    DOB: October 08, 1960, 50 y.o.   MRN: [id number]  HPI  Patient comes in for an acute visit. He reports one-week history of left lower extremity swelling. He recalls feeling a "pop" in his left calf. This was followed by swelling which has persisted. He has some discomfort in the leg especially with palpation. No erythema. No weakness.  Past Medical History  Diagnosis Date  . DIABETES MELLITUS, TYPE II 09/28/2006  . HYPERLIPIDEMIA 09/28/2006  . HYPERTENSION 09/28/2006  . CEREBROVASCULAR ACCIDENT, HX OF 09/28/2006    2007 affected left side and peripheral vision  . GERD (gastroesophageal reflux disease)   . Stroke    Past Surgical History  Procedure Date  . Rhinoplasty   . Lymphadenectomy   . Sigmoidoscopy     reports that he has been smoking Cigarettes.  He has been smoking about 1 pack per day. He uses smokeless tobacco. He reports that he does not drink alcohol or use illicit drugs. family history includes Alcohol abuse in his brother; Diabetes in his brother and father; Heart attack in his sister; Lung cancer in his mother; and Stroke in his father. Allergies  Allergen Reactions  . Sulfamethoxazole     REACTION: rash     Review of Systems    patient denies chest pain, shortness of breath, orthopnea. Denies lower extremity edema, abdominal pain, change in appetite, change in bowel movements. Patient denies rashes, musculoskeletal complaints. No other specific complaints in a complete review of systems.    Objective:   Physical Exam  Well-developed male in no acute distress. Neck is supple. Chest clear to auscultation cardiac exam S1-S2 are regular. Left lower extremity with significant swelling of the calf. Mild tenderness to palpation. There is 1-2+ edema to the knee on the left.      Assessment & Plan:  Left leg pain. I doubt that he has a DVT but this needs to be evaluated. We'll schedule ultrasound of the leg.

## 2011-02-16 ENCOUNTER — Encounter (INDEPENDENT_AMBULATORY_CARE_PROVIDER_SITE_OTHER): Payer: Medicare Other | Admitting: *Deleted

## 2011-02-16 DIAGNOSIS — M79605 Pain in left leg: Secondary | ICD-10-CM

## 2011-02-16 DIAGNOSIS — M79609 Pain in unspecified limb: Secondary | ICD-10-CM

## 2011-02-18 ENCOUNTER — Telehealth: Payer: Self-pay | Admitting: *Deleted

## 2011-02-18 NOTE — Telephone Encounter (Signed)
Pt is asking for U/S results of leg to rule out DVT.

## 2011-02-18 NOTE — Telephone Encounter (Signed)
Pt aware of results 

## 2011-03-03 DIAGNOSIS — Z0279 Encounter for issue of other medical certificate: Secondary | ICD-10-CM

## 2011-03-07 ENCOUNTER — Other Ambulatory Visit: Payer: Self-pay | Admitting: Internal Medicine

## 2011-03-07 DIAGNOSIS — E119 Type 2 diabetes mellitus without complications: Secondary | ICD-10-CM

## 2011-04-28 ENCOUNTER — Other Ambulatory Visit: Payer: Self-pay | Admitting: *Deleted

## 2011-05-04 ENCOUNTER — Other Ambulatory Visit: Payer: Self-pay | Admitting: *Deleted

## 2011-05-04 MED ORDER — LABETALOL HCL 100 MG PO TABS: 100.0000 mg | ORAL_TABLET | Freq: Two times a day (BID) | ORAL | Status: DC

## 2011-05-12 ENCOUNTER — Telehealth: Payer: Self-pay | Admitting: Internal Medicine

## 2011-05-12 NOTE — Telephone Encounter (Signed)
L/m I need to know if its a vial or pen.  We don't have any pens but we do have vials.

## 2011-05-12 NOTE — Telephone Encounter (Signed)
Needs insulin samples. His friend could pick up this morning. Call [phone].

## 2011-05-12 NOTE — Telephone Encounter (Signed)
Pt called back. He wants the vials. He will come by today. Thanks.

## 2011-05-12 NOTE — Telephone Encounter (Signed)
2 vials given

## 2011-06-08 ENCOUNTER — Encounter: Payer: Self-pay | Admitting: Internal Medicine

## 2011-06-08 ENCOUNTER — Ambulatory Visit (INDEPENDENT_AMBULATORY_CARE_PROVIDER_SITE_OTHER): Payer: Medicare Other | Admitting: Internal Medicine

## 2011-06-08 VITALS — BP 128/80 | HR 82 | Temp 97.5°F | Wt 202.0 lb

## 2011-06-08 DIAGNOSIS — Z0000 Encounter for general adult medical examination without abnormal findings: Secondary | ICD-10-CM | POA: Diagnosis not present

## 2011-06-08 MED ORDER — HYDROCODONE-HOMATROPINE 5-1.5 MG/5ML PO SYRP: 5.0000 mL | ORAL_SOLUTION | Freq: Three times a day (TID) | ORAL | Status: DC | PRN

## 2011-06-08 NOTE — Progress Notes (Signed)
Patient ID: William [name], male   DOB: [birth year], 51 y.o.   MRN: [id number]  URI sxs x 2 weeks No fever or chills,  No sweats Has some cough but no wheeze or SOB Some nasal discharge  Past Medical History  Diagnosis Date  . DIABETES MELLITUS, TYPE II 09/28/2006  . HYPERLIPIDEMIA 09/28/2006  . HYPERTENSION 09/28/2006  . CEREBROVASCULAR ACCIDENT, HX OF 09/28/2006    2007 affected left side and peripheral vision  . GERD (gastroesophageal reflux disease)   . Stroke     History   Social History  . Marital Status: Divorced    Spouse Name: N/A    Number of Children: N/A  . Years of Education: N/A   Occupational History  . Not on file.   Social History Main Topics  . Smoking status: Current Everyday Smoker -- 1.0 packs/day    Types: Cigarettes  . Smokeless tobacco: Current User  . Alcohol Use: No  . Drug Use: No  . Sexually Active: Not on file   Other Topics Concern  . Not on file   Social History Narrative  . No narrative on file    Past Surgical History  Procedure Date  . Rhinoplasty   . Lymphadenectomy   . Sigmoidoscopy     Family History  Problem Relation Age of Onset  . Lung cancer Mother   . Diabetes Father   . Stroke Father   . Alcohol abuse Brother   . Diabetes Brother   . Heart attack Sister     Allergies  Allergen Reactions  . Sulfamethoxazole     REACTION: rash    Current Outpatient Prescriptions on File Prior to Visit  Medication Sig Dispense Refill  . amLODipine-olmesartan (AZOR) 10-40 MG per tablet Take 1 tablet by mouth daily.  30 tablet  1  . aspirin 81 MG tablet Take 81 mg by mouth daily.        . B-D INS SYRINGE 0.5CC/31GX5/16 31G X 5/16" 0.5 ML MISC USE AS DIRECTED  100 each  11  . gabapentin (NEURONTIN) 300 MG capsule TAKE 2 CAPSULES BY MOUTH EVERY DAY AT BEDTIME  180 capsule  2  . hydrochlorothiazide (HYDRODIURIL) 25 MG tablet Take 1 tablet (25 mg total) by mouth daily.  30 tablet  5  . insulin glargine (LANTUS) 100 UNIT/ML  injection Inject 25 Units into the skin 2 (two) times daily.  10 mL  11  . labetalol (NORMODYNE) 100 MG tablet Take 1 tablet (100 mg total) by mouth 2 (two) times daily.  180 tablet  1  . metFORMIN (GLUCOPHAGE) 500 MG tablet TAKE 2 TABLETS TWICE A DAY  240 tablet  4  . omeprazole (PRILOSEC) 20 MG capsule Take 20 mg by mouth daily.        . simvastatin (ZOCOR) 20 MG tablet TAKE 1 TABLET AT BEDTIME  90 tablet  3     patient denies chest pain, shortness of breath, orthopnea. Denies lower extremity edema, abdominal pain, change in appetite, change in bowel movements. Patient denies rashes, musculoskeletal complaints. No other specific complaints in a complete review of systems.   BP 128/80  Pulse 82  Temp(Src) 97.5 F (36.4 C) (Oral)  Wt 202 lb (91.627 kg)  SpO2 95%  well-developed well-nourished male in no acute distress. HEENT exam atraumatic, normocephalic, neck supple without jugular venous distention. Chest clear to auscultation cardiac exam S1-S2 are regular. Abdominal exam overweight with bowel sounds, soft and nontender. Extremities no edema. Neurologic exam is alert  with a normal gait.   A/P: URI---no evidence of bacterial infection Trial of cough suppressant.

## 2011-06-09 ENCOUNTER — Telehealth: Payer: Self-pay | Admitting: Family Medicine

## 2011-06-09 NOTE — Telephone Encounter (Signed)
Please call sister ASAP about brother's visit with Dr. Leanne Chang. Wants a call before 3:15!!

## 2011-06-10 NOTE — Telephone Encounter (Signed)
Pls advise.  

## 2011-06-10 NOTE — Telephone Encounter (Signed)
Pt was seen on Monday requesting abx for sinus infection call into [facility] [place]

## 2011-06-10 NOTE — Telephone Encounter (Signed)
Notified pt. 

## 2011-06-10 NOTE — Telephone Encounter (Signed)
mucinex dm bid for 7 days

## 2011-06-16 ENCOUNTER — Other Ambulatory Visit: Payer: Self-pay | Admitting: Internal Medicine

## 2011-06-17 ENCOUNTER — Other Ambulatory Visit: Payer: Self-pay | Admitting: *Deleted

## 2011-06-22 ENCOUNTER — Telehealth: Payer: Self-pay | Admitting: Internal Medicine

## 2011-06-22 NOTE — Telephone Encounter (Signed)
Patient is requesting an Rx for sinus congestion.  He would like an antibiotic called into [facility] in [place].  Please notify patient if RX can be called in,

## 2011-06-22 NOTE — Telephone Encounter (Signed)
Have him take mucinex dm bid for 7 days

## 2011-06-22 NOTE — Telephone Encounter (Signed)
Pt states he has been taking Mucinex dm since last week and got a new pack and started to take it this week as well.  Pt states he has seen some improvement but would like an antibotic if possible.

## 2011-06-23 NOTE — Telephone Encounter (Signed)
No abx at this time

## 2011-06-23 NOTE — Telephone Encounter (Signed)
Notified pt. 

## 2011-06-26 ENCOUNTER — Other Ambulatory Visit: Payer: Self-pay | Admitting: *Deleted

## 2011-06-26 DIAGNOSIS — I10 Essential (primary) hypertension: Secondary | ICD-10-CM

## 2011-06-26 MED ORDER — HYDROCHLOROTHIAZIDE 25 MG PO TABS: 25.0000 mg | ORAL_TABLET | Freq: Every day | ORAL | Status: DC

## 2011-06-29 ENCOUNTER — Ambulatory Visit (INDEPENDENT_AMBULATORY_CARE_PROVIDER_SITE_OTHER): Payer: Medicare Other | Admitting: Internal Medicine

## 2011-06-29 ENCOUNTER — Encounter: Payer: Self-pay | Admitting: Internal Medicine

## 2011-06-29 VITALS — BP 124/72 | HR 80 | Temp 98.4°F | Ht 69.0 in | Wt 203.0 lb

## 2011-06-29 DIAGNOSIS — E785 Hyperlipidemia, unspecified: Secondary | ICD-10-CM

## 2011-06-29 DIAGNOSIS — E119 Type 2 diabetes mellitus without complications: Secondary | ICD-10-CM

## 2011-06-29 DIAGNOSIS — Z8679 Personal history of other diseases of the circulatory system: Secondary | ICD-10-CM

## 2011-06-29 DIAGNOSIS — I10 Essential (primary) hypertension: Secondary | ICD-10-CM

## 2011-06-29 LAB — LDL CHOLESTEROL, DIRECT: Direct LDL: 60.2 mg/dL

## 2011-06-29 LAB — BASIC METABOLIC PANEL
BUN: 26 mg/dL — ABNORMAL HIGH (ref 6–23)
CO2: 30 mEq/L (ref 19–32)
GFR: 66.74 mL/min (ref >60.00)
Glucose, Bld: 91 mg/dL (ref 70–99)
Potassium: 4.1 mEq/L (ref 3.5–5.1)
Sodium: 139 mEq/L (ref 135–145)

## 2011-06-29 LAB — LIPID PANEL: HDL: 33.8 mg/dL — ABNORMAL LOW (ref >39.00)

## 2011-06-29 LAB — HEPATIC FUNCTION PANEL
ALT: 29 U/L (ref 0–53)
AST: 17 U/L (ref 0–37)
Albumin: 3.3 g/dL — ABNORMAL LOW (ref 3.5–5.2)
Alkaline Phosphatase: 82 U/L (ref 39–117)
Total Protein: 7.1 g/dL (ref 6.0–8.3)

## 2011-06-29 LAB — HEMOGLOBIN A1C: Hgb A1c MFr Bld: 10.9 % — ABNORMAL HIGH (ref 4.6–6.5)

## 2011-06-29 NOTE — Assessment & Plan Note (Signed)
BP Readings from Last 3 Encounters:  06/29/11 124/72  06/08/11 128/80  02/11/11 122/74   Well controlled Continue same meds

## 2011-06-29 NOTE — Assessment & Plan Note (Signed)
Taking meds Not trying to lose weight, exercise or follow a DM diet.  He understands need to do all of the above

## 2011-06-29 NOTE — Assessment & Plan Note (Signed)
No recurrent sxs

## 2011-06-29 NOTE — Progress Notes (Signed)
Patient ID: William [name], male   DOB: 1960/07/05, 51 y.o.   MRN: [id number]   patient comes in for followup of multiple medical problems including type 2 diabetes, hyperlipidemia, hypertension. The patient does not check blood sugar or blood pressure at home. The patetient does not follow an exercise or diet program. The patient denies any polyuria, polydipsia.  In the past the patient has gone to diabetic treatment center. The patient is tolerating medications  Without difficulty. The patient does admit to medication compliance.    Past Medical History  Diagnosis Date  . DIABETES MELLITUS, TYPE II 09/28/2006  . HYPERLIPIDEMIA 09/28/2006  . HYPERTENSION 09/28/2006  . CEREBROVASCULAR ACCIDENT, HX OF 09/28/2006    2007 affected left side and peripheral vision  . GERD (gastroesophageal reflux disease)   . Stroke     History   Social History  . Marital Status: Divorced    Spouse Name: N/A    Number of Children: N/A  . Years of Education: N/A   Occupational History  . Not on file.   Social History Main Topics  . Smoking status: Current Everyday Smoker -- 1.0 packs/day    Types: Cigarettes  . Smokeless tobacco: Current User  . Alcohol Use: No  . Drug Use: No  . Sexually Active: Not on file   Other Topics Concern  . Not on file   Social History Narrative  . No narrative on file    Past Surgical History  Procedure Date  . Rhinoplasty   . Lymphadenectomy   . Sigmoidoscopy     Family History  Problem Relation Age of Onset  . Lung cancer Mother   . Diabetes Father   . Stroke Father   . Alcohol abuse Brother   . Diabetes Brother   . Heart attack Sister     Allergies  Allergen Reactions  . Sulfamethoxazole     REACTION: rash    Current Outpatient Prescriptions on File Prior to Visit  Medication Sig Dispense Refill  . amLODipine-olmesartan (AZOR) 10-40 MG per tablet Take 1 tablet by mouth daily.  30 tablet  1  . aspirin 81 MG tablet Take 81 mg by mouth daily.         . B-D INS SYRINGE 0.5CC/31GX5/16 31G X 5/16" 0.5 ML MISC USE AS DIRECTED  100 each  5  . gabapentin (NEURONTIN) 300 MG capsule TAKE 2 CAPSULES BY MOUTH EVERY DAY AT BEDTIME  180 capsule  2  . hydrochlorothiazide (HYDRODIURIL) 25 MG tablet Take 1 tablet (25 mg total) by mouth daily.  30 tablet  5  . insulin glargine (LANTUS) 100 UNIT/ML injection Inject 25 Units into the skin 2 (two) times daily.  10 mL  11  . labetalol (NORMODYNE) 100 MG tablet Take 1 tablet (100 mg total) by mouth 2 (two) times daily.  180 tablet  1  . metFORMIN (GLUCOPHAGE) 500 MG tablet TAKE 2 TABLETS TWICE A DAY  240 tablet  4  . omeprazole (PRILOSEC) 20 MG capsule Take 20 mg by mouth daily.        . simvastatin (ZOCOR) 20 MG tablet TAKE 1 TABLET AT BEDTIME  90 tablet  3     patient denies chest pain, shortness of breath, orthopnea. Denies lower extremity edema, abdominal pain, change in appetite, change in bowel movements. Patient denies rashes, musculoskeletal complaints. No other specific complaints in a complete review of systems.   BP 124/72  Pulse 80  Temp(Src) 98.4 F (36.9 C) (Oral)  Ht  5\' 9"  (1.753 m)  Wt 203 lb (92.08 kg)  BMI 29.98 kg/m2  well-developed well-nourished male in no acute distress. HEENT exam atraumatic, normocephalic, neck supple without jugular venous distention. Chest clear to auscultation cardiac exam S1-S2 are regular. Abdominal exam overweight with bowel sounds, soft and nontender. Extremities no edema. Neurologic exam is alert with a normal gait.

## 2011-06-29 NOTE — Progress Notes (Signed)
Addended by: Lisabeth Pick on: 06/29/2011 09:20 AM   Modules accepted: Orders

## 2011-06-29 NOTE — Assessment & Plan Note (Signed)
Lab Results  Component Value Date   CHOL 135 10/30/2010   HDL 31.60* 10/30/2010   LDLCALC 71 05/27/2010   LDLDIRECT 90.7 10/30/2010   TRIG 255.0* 10/30/2010   CHOLHDL 4 10/30/2010   Check labs today

## 2011-07-02 ENCOUNTER — Other Ambulatory Visit: Payer: Self-pay | Admitting: *Deleted

## 2011-07-27 ENCOUNTER — Encounter: Payer: Self-pay | Admitting: Gastroenterology

## 2011-07-28 ENCOUNTER — Other Ambulatory Visit: Payer: Self-pay | Admitting: Internal Medicine

## 2011-08-20 ENCOUNTER — Telehealth: Payer: Self-pay | Admitting: Internal Medicine

## 2011-08-20 DIAGNOSIS — E119 Type 2 diabetes mellitus without complications: Secondary | ICD-10-CM

## 2011-08-20 DIAGNOSIS — I10 Essential (primary) hypertension: Secondary | ICD-10-CM

## 2011-08-20 MED ORDER — INSULIN GLARGINE 100 UNIT/ML ~~LOC~~ SOLN: 25.0000 [IU] | Freq: Two times a day (BID) | SUBCUTANEOUS | Status: DC

## 2011-08-20 MED ORDER — AMLODIPINE-OLMESARTAN 10-40 MG PO TABS: 1.0000 | ORAL_TABLET | Freq: Every day | ORAL | Status: DC

## 2011-08-20 NOTE — Telephone Encounter (Signed)
Pt requesting samples of amLODipine-olmesartan (AZOR) 10-40 MG per tablet and LANTUS   please contact

## 2011-08-20 NOTE — Telephone Encounter (Signed)
No samples of Lantus, sent Lantus in electronically and samples of Azor up front ready for p/u, pt aware

## 2011-11-24 ENCOUNTER — Other Ambulatory Visit: Payer: Self-pay | Admitting: *Deleted

## 2011-11-24 MED ORDER — GABAPENTIN 300 MG PO CAPS: 600.0000 mg | ORAL_CAPSULE | Freq: Every day | ORAL | Status: DC

## 2011-11-30 ENCOUNTER — Telehealth: Payer: Self-pay | Admitting: Internal Medicine

## 2011-11-30 ENCOUNTER — Other Ambulatory Visit: Payer: Self-pay | Admitting: Internal Medicine

## 2011-11-30 NOTE — Telephone Encounter (Signed)
Pt needs some samples of meds please call and advise if he can get some and if so when.

## 2011-12-01 NOTE — Telephone Encounter (Signed)
Left message for pt to call back  °

## 2011-12-03 NOTE — Telephone Encounter (Signed)
Left message for pt to call back  °

## 2011-12-04 ENCOUNTER — Telehealth: Payer: Self-pay | Admitting: Internal Medicine

## 2011-12-04 DIAGNOSIS — I10 Essential (primary) hypertension: Secondary | ICD-10-CM

## 2011-12-04 MED ORDER — AMLODIPINE-OLMESARTAN 10-40 MG PO TABS: 1.0000 | ORAL_TABLET | Freq: Every day | ORAL | Status: DC

## 2011-12-04 NOTE — Telephone Encounter (Signed)
Pt needs more samples Azor 10-40 mg. Pls call when ready for pick up.

## 2011-12-04 NOTE — Telephone Encounter (Signed)
No samples, rx sent in electronically to [place]

## 2011-12-25 ENCOUNTER — Telehealth: Payer: Self-pay | Admitting: Internal Medicine

## 2011-12-25 NOTE — Telephone Encounter (Signed)
Patient's sister came in stating that her brother has been prescribed azor and it is too expensive and she would like to know if he can be prescribed something cheaper. Please advise.

## 2011-12-26 NOTE — Telephone Encounter (Signed)
D/c azor Start amlodipine 5 mg po qd Start Losartan 100 mg po qd  #90/3 refills for both

## 2011-12-28 MED ORDER — LOSARTAN POTASSIUM 100 MG PO TABS: 100.0000 mg | ORAL_TABLET | Freq: Every day | ORAL | Status: DC

## 2011-12-28 MED ORDER — AMLODIPINE BESYLATE 5 MG PO TABS: 5.0000 mg | ORAL_TABLET | Freq: Every day | ORAL | Status: DC

## 2011-12-28 NOTE — Telephone Encounter (Signed)
rx sent in electronically, pt aware 

## 2011-12-30 ENCOUNTER — Ambulatory Visit (INDEPENDENT_AMBULATORY_CARE_PROVIDER_SITE_OTHER): Payer: Medicare Other | Admitting: Internal Medicine

## 2011-12-30 ENCOUNTER — Encounter: Payer: Self-pay | Admitting: Internal Medicine

## 2011-12-30 VITALS — BP 130/82 | HR 72 | Temp 97.9°F | Ht 69.0 in | Wt 191.0 lb

## 2011-12-30 DIAGNOSIS — E785 Hyperlipidemia, unspecified: Secondary | ICD-10-CM

## 2011-12-30 DIAGNOSIS — Z8679 Personal history of other diseases of the circulatory system: Secondary | ICD-10-CM

## 2011-12-30 DIAGNOSIS — Z23 Encounter for immunization: Secondary | ICD-10-CM | POA: Diagnosis not present

## 2011-12-30 DIAGNOSIS — E119 Type 2 diabetes mellitus without complications: Secondary | ICD-10-CM

## 2011-12-30 LAB — BASIC METABOLIC PANEL
Calcium: 9.3 mg/dL (ref 8.4–10.5)
Creatinine, Ser: 1.3 mg/dL (ref 0.4–1.5)
GFR: 62.43 mL/min (ref >60.00)
Glucose, Bld: 135 mg/dL — ABNORMAL HIGH (ref 70–99)
Sodium: 140 mEq/L (ref 135–145)

## 2011-12-30 LAB — LIPID PANEL
HDL: 30.6 mg/dL — ABNORMAL LOW (ref >39.00)
Triglycerides: 129 mg/dL (ref 0.0–149.0)
VLDL: 25.8 mg/dL (ref 0.0–40.0)

## 2011-12-30 LAB — HEPATIC FUNCTION PANEL
Albumin: 3.3 g/dL — ABNORMAL LOW (ref 3.5–5.2)
Alkaline Phosphatase: 101 U/L (ref 39–117)

## 2011-12-30 LAB — HEMOGLOBIN A1C: Hgb A1c MFr Bld: 7.1 % — ABNORMAL HIGH (ref 4.6–6.5)

## 2011-12-30 NOTE — Assessment & Plan Note (Signed)
Previously controlled Continue same meds

## 2011-12-30 NOTE — Progress Notes (Signed)
Patient ID: William [name], male   DOB: 1960-07-02, 51 y.o.   MRN: [id number]    patient comes in for followup of multiple medical problems including type 2 diabetes, hyperlipidemia, hypertension. The patient does not check blood sugar or blood pressure at home. The patetient does not follow an exercise or diet program. The patient denies any polyuria, polydipsia.  In the past the patient has gone to diabetic treatment center. The patient is tolerating medications  Without difficulty. The patient does admit to medication compliance.   Past Medical History  Diagnosis Date  . DIABETES MELLITUS, TYPE II 09/28/2006  . HYPERLIPIDEMIA 09/28/2006  . HYPERTENSION 09/28/2006  . CEREBROVASCULAR ACCIDENT, HX OF 09/28/2006    2007 affected left side and peripheral vision  . GERD (gastroesophageal reflux disease)   . Stroke     History   Social History  . Marital Status: Divorced    Spouse Name: N/A    Number of Children: N/A  . Years of Education: N/A   Occupational History  . Not on file.   Social History Main Topics  . Smoking status: Current Every Day Smoker -- 1.0 packs/day    Types: Cigarettes  . Smokeless tobacco: Current User  . Alcohol Use: No  . Drug Use: No  . Sexually Active: Not on file   Other Topics Concern  . Not on file   Social History Narrative  . No narrative on file    Past Surgical History  Procedure Date  . Rhinoplasty   . Lymphadenectomy   . Sigmoidoscopy     Family History  Problem Relation Age of Onset  . Lung cancer Mother   . Diabetes Father   . Stroke Father   . Alcohol abuse Brother   . Diabetes Brother   . Heart attack Sister     Allergies  Allergen Reactions  . Sulfamethoxazole     REACTION: rash    Current Outpatient Prescriptions on File Prior to Visit  Medication Sig Dispense Refill  . amLODipine (NORVASC) 5 MG tablet Take 1 tablet (5 mg total) by mouth daily.  90 tablet  3  . aspirin 81 MG tablet Take 81 mg by mouth daily.          . B-D INS SYRINGE 0.5CC/31GX5/16 31G X 5/16" 0.5 ML MISC USE AS DIRECTED  100 each  5  . gabapentin (NEURONTIN) 300 MG capsule Take 2 capsules (600 mg total) by mouth at bedtime.  180 capsule  2  . hydrochlorothiazide (HYDRODIURIL) 25 MG tablet Take 1 tablet (25 mg total) by mouth daily.  30 tablet  5  . insulin glargine (LANTUS) 100 UNIT/ML injection Inject 25 Units into the skin 2 (two) times daily.  10 mL  11  . labetalol (NORMODYNE) 100 MG tablet Take 1 tablet (100 mg total) by mouth 2 (two) times daily.  180 tablet  1  . losartan (COZAAR) 100 MG tablet Take 1 tablet (100 mg total) by mouth daily.  90 tablet  3  . metFORMIN (GLUCOPHAGE) 500 MG tablet TAKE 1 TABLET TWICE A DAY WITH MEALS  60 tablet  5  . omeprazole (PRILOSEC) 20 MG capsule Take 20 mg by mouth daily.        . ONE TOUCH ULTRA TEST test strip USE 4 TIMES A DAY AS DIRECTED  100 each  11  . simvastatin (ZOCOR) 20 MG tablet TAKE 1 TABLET AT BEDTIME  90 tablet  3  . DISCONTD: metFORMIN (GLUCOPHAGE) 500 MG tablet  TAKE 2 TABLETS TWICE A DAY  240 tablet  4     patient denies chest pain, shortness of breath, orthopnea. Denies lower extremity edema, abdominal pain, change in appetite, change in bowel movements. Patient denies rashes, musculoskeletal complaints. No other specific complaints in a complete review of systems.   BP 130/82  Pulse 72  Temp 97.9 F (36.6 C) (Oral)  Ht 5\' 9"  (1.753 m)  Wt 191 lb (86.637 kg)  BMI 28.21 kg/m2  well-developed well-nourished male in no acute distress. HEENT exam atraumatic, normocephalic, neck supple without jugular venous distention. Chest clear to auscultation cardiac exam S1-S2 are regular. Abdominal exam overweight with bowel sounds, soft and nontender. Extremities no edema. Neurologic exam is alert with a normal gait.

## 2011-12-30 NOTE — Assessment & Plan Note (Signed)
No recurrent sxs

## 2011-12-30 NOTE — Assessment & Plan Note (Signed)
Needs f/u Will check labs today

## 2011-12-31 ENCOUNTER — Other Ambulatory Visit: Payer: Self-pay | Admitting: Internal Medicine

## 2012-02-03 ENCOUNTER — Other Ambulatory Visit: Payer: Self-pay | Admitting: Internal Medicine

## 2012-02-21 ENCOUNTER — Other Ambulatory Visit: Payer: Self-pay | Admitting: Internal Medicine

## 2012-03-18 ENCOUNTER — Other Ambulatory Visit: Payer: Self-pay | Admitting: Internal Medicine

## 2012-03-18 NOTE — Telephone Encounter (Signed)
Pls advise if pt should be taking this medication.

## 2012-04-20 LAB — HM DIABETES EYE EXAM

## 2012-06-03 ENCOUNTER — Other Ambulatory Visit: Payer: Self-pay | Admitting: Internal Medicine

## 2012-06-18 ENCOUNTER — Other Ambulatory Visit: Payer: Self-pay | Admitting: Internal Medicine

## 2012-06-27 NOTE — Assessment & Plan Note (Signed)
Continue current meds 

## 2012-06-27 NOTE — Assessment & Plan Note (Signed)
Lipid Panel     Component Value Date/Time   CHOL 125 12/30/2011 0819   TRIG 129.0 12/30/2011 0819   HDL 30.60* 12/30/2011 0819   CHOLHDL 4 12/30/2011 0819   VLDL 25.8 12/30/2011 0819   LDLCALC 69 12/30/2011 0819    Labs ok previously Check labs today

## 2012-06-27 NOTE — Assessment & Plan Note (Signed)
Lab Results  Component Value Date   HGBA1C 7.1* 12/30/2011   Check a1c today as well as other labs

## 2012-06-27 NOTE — Progress Notes (Signed)
patient comes in for followup of multiple medical problems including type 2 diabetes, hyperlipidemia, hypertension. The patient does not check blood sugar or blood pressure at home. The patetient does not follow an exercise or diet program. The patient denies any polyuria, polydipsia.  In the past the patient has gone to diabetic treatment center. The patient is tolerating medications  Without difficulty. The patient does admit to medication compliance.    Past Medical History  Diagnosis Date  . DIABETES MELLITUS, TYPE II 09/28/2006  . HYPERLIPIDEMIA 09/28/2006  . HYPERTENSION 09/28/2006  . CEREBROVASCULAR ACCIDENT, HX OF 09/28/2006    2007 affected left side and peripheral vision  . GERD (gastroesophageal reflux disease)   . Stroke     History   Social History  . Marital Status: Divorced    Spouse Name: N/A    Number of Children: N/A  . Years of Education: N/A   Occupational History  . Not on file.   Social History Main Topics  . Smoking status: Current Every Day Smoker -- 1.00 packs/day    Types: Cigarettes  . Smokeless tobacco: Current User  . Alcohol Use: No  . Drug Use: No  . Sexually Active: Not on file   Other Topics Concern  . Not on file   Social History Narrative  . No narrative on file    Past Surgical History  Procedure Laterality Date  . Rhinoplasty    . Lymphadenectomy    . Sigmoidoscopy      Family History  Problem Relation Age of Onset  . Lung cancer Mother   . Diabetes Father   . Stroke Father   . Alcohol abuse Brother   . Diabetes Brother   . Heart attack Sister     Allergies  Allergen Reactions  . Sulfamethoxazole     REACTION: rash    Current Outpatient Prescriptions on File Prior to Visit  Medication Sig Dispense Refill  . amLODipine (NORVASC) 5 MG tablet Take 1 tablet (5 mg total) by mouth daily.  90 tablet  3  . aspirin 81 MG tablet Take 81 mg by mouth daily.        . B-D INS SYRINGE 0.5CC/31GX5/16 31G X 5/16" 0.5 ML MISC USE AS  DIRECTED  100 each  5  . gabapentin (NEURONTIN) 300 MG capsule Take 2 capsules (600 mg total) by mouth at bedtime.  180 capsule  2  . hydrochlorothiazide (HYDRODIURIL) 25 MG tablet TAKE 1 TABLET BY MOUTH DAILY  30 tablet  5  . insulin glargine (LANTUS) 100 UNIT/ML injection Inject 25 Units into the skin 2 (two) times daily.  10 mL  11  . labetalol (NORMODYNE) 200 MG tablet TAKE 1 TABLET BY MOUTH TWICE A DAY  180 tablet  1  . losartan (COZAAR) 100 MG tablet Take 1 tablet (100 mg total) by mouth daily.  90 tablet  3  . metFORMIN (GLUCOPHAGE) 500 MG tablet TAKE 1 TABLET TWICE A DAY WITH MEALS  60 tablet  5  . omeprazole (PRILOSEC) 20 MG capsule Take 20 mg by mouth daily.        . ONE TOUCH ULTRA TEST test strip USE AS DIRECTED 4 TIMES A DAY  100 each  11  . simvastatin (ZOCOR) 20 MG tablet TAKE 1 TABLET AT BEDTIME  90 tablet  3   No current facility-administered medications on file prior to visit.     patient denies chest pain, shortness of breath, orthopnea. Denies lower extremity edema, abdominal pain, change  in appetite, change in bowel movements. Patient denies rashes, musculoskeletal complaints. No other specific complaints in a complete review of systems.   There were no vitals taken for this visit.  well-developed well-nourished male in no acute distress. HEENT exam atraumatic, normocephalic, neck supple without jugular venous distention. Chest clear to auscultation cardiac exam S1-S2 are regular. Abdominal exam overweight with bowel sounds, soft and nontender. Extremities no edema. Neurologic exam is alert with a normal gait.  This encounter was created in error - please disregard.

## 2012-06-28 ENCOUNTER — Encounter: Payer: Medicare Other | Admitting: Internal Medicine

## 2012-06-29 ENCOUNTER — Other Ambulatory Visit (INDEPENDENT_AMBULATORY_CARE_PROVIDER_SITE_OTHER): Payer: Medicare Other

## 2012-06-29 DIAGNOSIS — E1110 Type 2 diabetes mellitus with ketoacidosis without coma: Secondary | ICD-10-CM | POA: Diagnosis not present

## 2012-06-29 LAB — HEMOGLOBIN A1C: Hgb A1c MFr Bld: 8.4 % — ABNORMAL HIGH (ref 4.6–6.5)

## 2012-07-01 ENCOUNTER — Other Ambulatory Visit: Payer: Self-pay | Admitting: Internal Medicine

## 2012-08-22 ENCOUNTER — Other Ambulatory Visit: Payer: Self-pay | Admitting: Internal Medicine

## 2012-08-30 ENCOUNTER — Encounter: Payer: Self-pay | Admitting: Internal Medicine

## 2012-08-30 ENCOUNTER — Ambulatory Visit (INDEPENDENT_AMBULATORY_CARE_PROVIDER_SITE_OTHER): Payer: Medicare Other | Admitting: Internal Medicine

## 2012-08-30 ENCOUNTER — Other Ambulatory Visit: Payer: Self-pay | Admitting: Internal Medicine

## 2012-08-30 VITALS — BP 132/72 | HR 68 | Temp 98.7°F | Wt 193.0 lb

## 2012-08-30 DIAGNOSIS — I10 Essential (primary) hypertension: Secondary | ICD-10-CM | POA: Diagnosis not present

## 2012-08-30 DIAGNOSIS — E1159 Type 2 diabetes mellitus with other circulatory complications: Secondary | ICD-10-CM | POA: Diagnosis not present

## 2012-08-30 DIAGNOSIS — E119 Type 2 diabetes mellitus without complications: Secondary | ICD-10-CM | POA: Diagnosis not present

## 2012-08-30 DIAGNOSIS — E785 Hyperlipidemia, unspecified: Secondary | ICD-10-CM

## 2012-08-30 MED ORDER — INSULIN GLARGINE 100 UNIT/ML ~~LOC~~ SOLN: 30.0000 [IU] | Freq: Two times a day (BID) | SUBCUTANEOUS | Status: DC

## 2012-08-30 NOTE — Progress Notes (Signed)
patient comes in for followup of multiple medical problems including type 2 diabetes, hyperlipidemia, hypertension. The patient does not check blood sugar or blood pressure at home. The patetient does not follow an exercise or diet program. The patient denies any polyuria, polydipsia.  In the past the patient has gone to diabetic treatment center. The patient is tolerating medications  Without difficulty. The patient does admit to medication compliance.   Past Medical History  Diagnosis Date  . DIABETES MELLITUS, TYPE II 09/28/2006  . HYPERLIPIDEMIA 09/28/2006  . HYPERTENSION 09/28/2006  . CEREBROVASCULAR ACCIDENT, HX OF 09/28/2006    2007 affected left side and peripheral vision  . GERD (gastroesophageal reflux disease)   . Stroke     History   Social History  . Marital Status: Divorced    Spouse Name: N/A    Number of Children: N/A  . Years of Education: N/A   Occupational History  . Not on file.   Social History Main Topics  . Smoking status: Current Every Day Smoker -- 1.00 packs/day    Types: Cigarettes  . Smokeless tobacco: Current User  . Alcohol Use: No  . Drug Use: No  . Sexually Active: Not on file   Other Topics Concern  . Not on file   Social History Narrative  . No narrative on file    Past Surgical History  Procedure Laterality Date  . Rhinoplasty    . Lymphadenectomy    . Sigmoidoscopy      Family History  Problem Relation Age of Onset  . Lung cancer Mother   . Diabetes Father   . Stroke Father   . Alcohol abuse Brother   . Diabetes Brother   . Heart attack Sister     Allergies  Allergen Reactions  . Sulfamethoxazole     REACTION: rash    Current Outpatient Prescriptions on File Prior to Visit  Medication Sig Dispense Refill  . amLODipine (NORVASC) 5 MG tablet Take 1 tablet (5 mg total) by mouth daily.  90 tablet  3  . aspirin 81 MG tablet Take 81 mg by mouth daily.        . B-D INS SYRINGE 0.5CC/31GX5/16 31G X 5/16" 0.5 ML MISC USE AS  DIRECTED  100 each  5  . gabapentin (NEURONTIN) 300 MG capsule TAKE 2 CAPSULES (600 MG TOTAL) BY MOUTH AT BEDTIME.  180 capsule  2  . hydrochlorothiazide (HYDRODIURIL) 25 MG tablet TAKE 1 TABLET BY MOUTH DAILY  30 tablet  5  . labetalol (NORMODYNE) 200 MG tablet TAKE 1 TABLET BY MOUTH TWICE A DAY  180 tablet  1  . LANTUS 100 UNIT/ML injection INJECT 25 UNITS INTO THE SKIN TWICE A DAY  20 mL  11  . losartan (COZAAR) 100 MG tablet Take 1 tablet (100 mg total) by mouth daily.  90 tablet  3  . metFORMIN (GLUCOPHAGE) 500 MG tablet TAKE 1 TABLET TWICE A DAY WITH MEALS  60 tablet  5  . omeprazole (PRILOSEC) 20 MG capsule Take 20 mg by mouth daily.        . ONE TOUCH ULTRA TEST test strip USE AS DIRECTED 4 TIMES A DAY  100 each  11  . simvastatin (ZOCOR) 20 MG tablet TAKE 1 TABLET AT BEDTIME  90 tablet  3   No current facility-administered medications on file prior to visit.     patient denies chest pain, shortness of breath, orthopnea. Denies lower extremity edema, abdominal pain, change in appetite, change in  bowel movements. Patient denies rashes, musculoskeletal complaints. No other specific complaints in a complete review of systems.   There were no vitals taken for this visit.   well-developed well-nourished male in no acute distress. HEENT exam atraumatic, normocephalic, neck supple without jugular venous distention. Chest clear to auscultation cardiac exam S1-S2 are regular. Abdominal exam overweight with bowel sounds, soft and nontender. Extremities no edema. Neurologic exam is alert with a normal gait.

## 2012-09-01 LAB — HM DIABETES FOOT EXAM

## 2012-09-01 NOTE — Assessment & Plan Note (Signed)
Will check labs periodically

## 2012-09-01 NOTE — Assessment & Plan Note (Signed)
BP Readings from Last 3 Encounters:  08/30/12 132/72  12/30/11 130/82  06/29/11 124/72   Fair control- continue meds

## 2012-09-01 NOTE — Assessment & Plan Note (Signed)
Need to check labs  Discussed need for aggressive weight loss

## 2012-09-03 ENCOUNTER — Other Ambulatory Visit: Payer: Self-pay | Admitting: Internal Medicine

## 2012-09-06 ENCOUNTER — Other Ambulatory Visit: Payer: Self-pay | Admitting: Internal Medicine

## 2012-09-17 ENCOUNTER — Other Ambulatory Visit: Payer: Self-pay | Admitting: Internal Medicine

## 2012-09-29 ENCOUNTER — Other Ambulatory Visit: Payer: Self-pay | Admitting: Internal Medicine

## 2012-09-30 ENCOUNTER — Other Ambulatory Visit: Payer: Self-pay | Admitting: Internal Medicine

## 2012-09-30 ENCOUNTER — Other Ambulatory Visit: Payer: Self-pay | Admitting: *Deleted

## 2012-09-30 MED ORDER — SIMVASTATIN 20 MG PO TABS: ORAL_TABLET | ORAL | Status: DC

## 2012-10-27 ENCOUNTER — Other Ambulatory Visit: Payer: Self-pay

## 2013-01-14 ENCOUNTER — Other Ambulatory Visit: Payer: Self-pay | Admitting: Internal Medicine

## 2013-02-02 ENCOUNTER — Ambulatory Visit (INDEPENDENT_AMBULATORY_CARE_PROVIDER_SITE_OTHER): Payer: Medicare Other

## 2013-02-02 DIAGNOSIS — Z23 Encounter for immunization: Secondary | ICD-10-CM | POA: Diagnosis not present

## 2013-02-11 ENCOUNTER — Other Ambulatory Visit: Payer: Self-pay | Admitting: Internal Medicine

## 2013-02-18 LAB — HM DIABETES EYE EXAM

## 2013-02-26 ENCOUNTER — Other Ambulatory Visit: Payer: Self-pay | Admitting: Internal Medicine

## 2013-03-01 ENCOUNTER — Ambulatory Visit: Payer: Medicare Other | Admitting: Internal Medicine

## 2013-03-05 ENCOUNTER — Other Ambulatory Visit: Payer: Self-pay | Admitting: Internal Medicine

## 2013-03-14 ENCOUNTER — Ambulatory Visit (INDEPENDENT_AMBULATORY_CARE_PROVIDER_SITE_OTHER): Payer: Medicare Other | Admitting: Internal Medicine

## 2013-03-14 ENCOUNTER — Encounter: Payer: Self-pay | Admitting: Internal Medicine

## 2013-03-14 VITALS — BP 118/74 | HR 76 | Temp 98.3°F | Ht 69.0 in | Wt 194.0 lb

## 2013-03-14 DIAGNOSIS — I10 Essential (primary) hypertension: Secondary | ICD-10-CM

## 2013-03-14 DIAGNOSIS — E1159 Type 2 diabetes mellitus with other circulatory complications: Secondary | ICD-10-CM

## 2013-03-14 DIAGNOSIS — E785 Hyperlipidemia, unspecified: Secondary | ICD-10-CM

## 2013-03-14 DIAGNOSIS — E119 Type 2 diabetes mellitus without complications: Secondary | ICD-10-CM

## 2013-03-14 LAB — BASIC METABOLIC PANEL
BUN: 31 mg/dL — ABNORMAL HIGH (ref 6–23)
CO2: 32 mEq/L (ref 19–32)
Chloride: 102 mEq/L (ref 96–112)
Glucose, Bld: 77 mg/dL (ref 70–99)
Potassium: 3.7 mEq/L (ref 3.5–5.1)
Sodium: 139 mEq/L (ref 135–145)

## 2013-03-14 LAB — HEPATIC FUNCTION PANEL
ALT: 16 U/L (ref 0–53)
Albumin: 3.4 g/dL — ABNORMAL LOW (ref 3.5–5.2)
Alkaline Phosphatase: 79 U/L (ref 39–117)
Total Protein: 7.6 g/dL (ref 6.0–8.3)

## 2013-03-14 LAB — MICROALBUMIN / CREATININE URINE RATIO
Creatinine,U: 187.2 mg/dL
Microalb Creat Ratio: 132.8 mg/g — ABNORMAL HIGH (ref 0.0–30.0)
Microalb, Ur: 248.6 mg/dL — ABNORMAL HIGH (ref 0.0–1.9)

## 2013-03-14 LAB — HEMOGLOBIN A1C: Hgb A1c MFr Bld: 7.7 % — ABNORMAL HIGH (ref 4.6–6.5)

## 2013-03-14 LAB — LIPID PANEL
Cholesterol: 142 mg/dL (ref 0–200)
HDL: 27.5 mg/dL — ABNORMAL LOW (ref >39.00)
LDL Cholesterol: 78 mg/dL (ref 0–99)
VLDL: 36.4 mg/dL (ref 0.0–40.0)

## 2013-03-14 NOTE — Assessment & Plan Note (Signed)
Needs f/u labs Discussed need for diet and exercise.  Goal bmi< 25

## 2013-03-14 NOTE — Progress Notes (Signed)
Pre visit review using our clinic review tool, if applicable. No additional management support is needed unless otherwise documented below in the visit note. 

## 2013-03-14 NOTE — Assessment & Plan Note (Signed)
Fair control Continue same meds 

## 2013-03-14 NOTE — Progress Notes (Signed)
patient comes in for followup of multiple medical problems including type 2 diabetes, hyperlipidemia, hypertension. The patient does not check blood sugar or blood pressure at home. The patetient does not follow an exercise or diet program. The patient denies any polyuria, polydipsia.  In the past the patient has gone to diabetic treatment center. The patient is tolerating medications  Without difficulty. The patient does admit to medication compliance.   Past Medical History  Diagnosis Date  . DIABETES MELLITUS, TYPE II 09/28/2006  . HYPERLIPIDEMIA 09/28/2006  . HYPERTENSION 09/28/2006  . CEREBROVASCULAR ACCIDENT, HX OF 09/28/2006    2007 affected left side and peripheral vision  . GERD (gastroesophageal reflux disease)   . Stroke     History   Social History  . Marital Status: Divorced    Spouse Name: N/A    Number of Children: N/A  . Years of Education: N/A   Occupational History  . Not on file.   Social History Main Topics  . Smoking status: Current Every Day Smoker -- 1.00 packs/day    Types: Cigarettes  . Smokeless tobacco: Current User  . Alcohol Use: No  . Drug Use: No  . Sexual Activity: Not on file   Other Topics Concern  . Not on file   Social History Narrative  . No narrative on file    Past Surgical History  Procedure Laterality Date  . Rhinoplasty    . Lymphadenectomy    . Sigmoidoscopy      Family History  Problem Relation Age of Onset  . Lung cancer Mother   . Diabetes Father   . Stroke Father   . Alcohol abuse Brother   . Diabetes Brother   . Heart attack Sister     Allergies  Allergen Reactions  . Sulfamethoxazole     REACTION: rash    Current Outpatient Prescriptions on File Prior to Visit  Medication Sig Dispense Refill  . amLODipine (NORVASC) 5 MG tablet TAKE 1 TABLET EVERY DAY  90 tablet  1  . aspirin 81 MG tablet Take 81 mg by mouth daily.        . B-D INS SYRINGE 0.5CC/31GX5/16 31G X 5/16" 0.5 ML MISC USE AS DIRECTED  100 each  5   . gabapentin (NEURONTIN) 300 MG capsule TAKE 2 CAPSULES (600 MG TOTAL) BY MOUTH AT BEDTIME.  180 capsule  2  . gabapentin (NEURONTIN) 300 MG capsule TAKE 2 CAPSULES (600 MG TOTAL) BY MOUTH AT BEDTIME.  180 capsule  2  . hydrochlorothiazide (HYDRODIURIL) 25 MG tablet TAKE 1 TABLET BY MOUTH DAILY  30 tablet  5  . insulin glargine (LANTUS) 100 UNIT/ML injection Inject 0.3 mLs (30 Units total) into the skin 2 (two) times daily.  10 mL    . labetalol (NORMODYNE) 200 MG tablet TAKE 1 TABLET BY MOUTH TWICE A DAY  180 tablet  1  . labetalol (NORMODYNE) 200 MG tablet TAKE 1 TABLET BY MOUTH TWICE A DAY  180 tablet  1  . losartan (COZAAR) 100 MG tablet TAKE 1 TABLET EVERY DAY  90 tablet  1  . metFORMIN (GLUCOPHAGE) 500 MG tablet TAKE 1 TABLET TWICE A DAY WITH MEALS  60 tablet  5  . omeprazole (PRILOSEC) 20 MG capsule Take 20 mg by mouth daily.        . ONE TOUCH ULTRA TEST test strip USE AS DIRECTED 4 TIMES A DAY  100 each  10  . simvastatin (ZOCOR) 20 MG tablet TAKE 1 TABLET AT  BEDTIME  90 tablet  0   No current facility-administered medications on file prior to visit.     patient denies chest pain, shortness of breath, orthopnea. Denies lower extremity edema, abdominal pain, change in appetite, change in bowel movements. Patient denies rashes, musculoskeletal complaints. No other specific complaints in a complete review of systems.   Reviewed vitals  well-developed well-nourished male in no acute distress. HEENT exam atraumatic, normocephalic, neck supple without jugular venous distention. Chest clear to auscultation cardiac exam S1-S2 are regular. Abdominal exam overweight with bowel sounds, soft and nontender. Extremities no edema. Neurologic exam is alert with a normal gait.

## 2013-03-14 NOTE — Assessment & Plan Note (Signed)
Will check labs today  Continue statin therapy

## 2013-04-12 ENCOUNTER — Other Ambulatory Visit: Payer: Self-pay | Admitting: Internal Medicine

## 2013-04-22 ENCOUNTER — Other Ambulatory Visit: Payer: Self-pay | Admitting: Internal Medicine

## 2013-05-29 ENCOUNTER — Telehealth: Payer: Self-pay | Admitting: Internal Medicine

## 2013-05-29 NOTE — Telephone Encounter (Signed)
Patient Information:  Caller Name: [name]  Phone: [phone]  Patient: William [name], William [name]  Gender: Male  DOB: 03-09-1961  Age: 54 Years  PCP: Phoebe Sharps (Adults only)  Office Follow Up:  Does the office need to follow up with this patient?: Yes  Instructions For The Office: Please contact patient regarding recent low blood sugars. Unable to eat currently had all teeth pulled.  RN Note:  Please contact patient regarding recent low blood sugars. Unable to eat currently had all teeth pulled.  Symptoms  Reason For Call & Symptoms: Patient states that his sugars are low. He states #54 and #60 area for the last three weeks. It is so low in the morning he has to have orange juice.  He states that he is not eating very well in the evening  because he has had his upper teeth pulled and they are healing.  He wakes early in morning being dizzy or lightheaded and has to eat /drink something. Patient is on Lantus 30 units twice a day.  At present GBS #88 . He has had grits.  Reviewed Health History In EMR: Yes  Reviewed Medications In EMR: Yes  Reviewed Allergies In EMR: Yes  Reviewed Surgeries / Procedures: Yes  Date of Onset of Symptoms: 05/08/2013  Treatments Tried: eat or drinking something sweet  Treatments Tried Worked: Yes  Guideline(s) Used:  Diabetes - Low Blood Sugar  Disposition Per Guideline:   Discuss with PCP and Callback by Nurse Today  Reason For Disposition Reached:   Morning (before breakfast) blood glucose < 80 mg/dl (4.5 mmol/L) and more than once in past week  Advice Given:  Reassurance  It sounds like an episode of low blood sugar (hypoglycemia) that we can treat at home.  Low blood sugar can result from taking too much diabetes medication, delayed meals, strenuous exercise, or a combination of these factors.  Definition  Low blood sugar (hypoglycemia) is defined as a blood glucose less than 70 mg/dl (3.9 mmol/l).  Symptoms of mild hypoglycemia: Shakiness, weakness, not  thinking clearly, headache, trembling, sweating, dizziness, palpitations, and hunger.  Contributing factors: Too much insulin, delayed meal, insufficient food, strenuous exercise, alcohol.  Call Back If:  There is no improvement within 30 minutes  Sleepiness or confusion occur  You become worse.  RN Overrode Recommendation:  Document Patient  Please contact patient regarding recent low blood sugars. Unable to eat currently had all teeth pulled.

## 2013-05-30 ENCOUNTER — Encounter: Payer: Self-pay | Admitting: Internal Medicine

## 2013-05-30 ENCOUNTER — Ambulatory Visit (INDEPENDENT_AMBULATORY_CARE_PROVIDER_SITE_OTHER): Payer: Medicare Other | Admitting: Internal Medicine

## 2013-05-30 VITALS — BP 150/80 | HR 84 | Temp 98.0°F | Resp 20 | Ht 69.0 in | Wt 190.0 lb

## 2013-05-30 DIAGNOSIS — E785 Hyperlipidemia, unspecified: Secondary | ICD-10-CM

## 2013-05-30 DIAGNOSIS — Z8679 Personal history of other diseases of the circulatory system: Secondary | ICD-10-CM

## 2013-05-30 DIAGNOSIS — I10 Essential (primary) hypertension: Secondary | ICD-10-CM | POA: Diagnosis not present

## 2013-05-30 DIAGNOSIS — E119 Type 2 diabetes mellitus without complications: Secondary | ICD-10-CM

## 2013-05-30 LAB — GLUCOSE, POCT (MANUAL RESULT ENTRY): POC Glucose: 66 mg/dl — AB (ref 70–99)

## 2013-05-30 MED ORDER — INSULIN GLARGINE 100 UNIT/ML ~~LOC~~ SOLN: SUBCUTANEOUS | Status: DC

## 2013-05-30 NOTE — Telephone Encounter (Signed)
appt scheduled with Dr Raliegh Ip at 4:15

## 2013-05-30 NOTE — Telephone Encounter (Signed)
Patient Information:  Caller Name: Kamyron  Phone: [phone]  Patient: William [name], William [name]  Gender: Male  DOB: 1961/04/03  Age: 53 Years  PCP: Phoebe Sharps (Adults only)  Office Follow Up:  Does the office need to follow up with this patient?: Yes  Instructions For The Office: Please call as he called on 05/29/13 for recommendations.   RN Note:  Pt's BS has been running low mainly in the middle of the night, 50-60. His fasting runs 80-120. Fasting BS 126 this AM. He had all of his teeth pulled 3 weeks ago, had dentures fitted the same day, making it difficult to eat, has lost weight.  Symptoms  Reason For Call & Symptoms: Low blood sugar  Reviewed Health History In EMR: Yes  Reviewed Medications In EMR: Yes  Reviewed Allergies In EMR: Yes  Reviewed Surgeries / Procedures: Yes  Date of Onset of Symptoms: 05/16/2013  Guideline(s) Used:  Diabetes - Low Blood Sugar  Disposition Per Guideline:   Discuss with PCP and Callback by Nurse Today  Reason For Disposition Reached:   Evening (after bedtime snack) blood glucose < 100 mg/dl (5.6 mmol/l) and more than once in past week  Advice Given:  Treatment  Orange juice (1/2 cup; 120 ml)  Glucose tablets (3 tablets)  Call Back If:  You become worse.  Patient Will Follow Care Advice:  YES

## 2013-05-30 NOTE — Patient Instructions (Signed)
Decrease Lantus insulin to 30 units every morning only  Continue a home blood sugar monitoring  Continue metformin therapy  Please call the office or report blood sugars in my chart over the next 2 weeks

## 2013-05-30 NOTE — Progress Notes (Signed)
Subjective:    Patient ID: William [name], male    DOB: [birth year], 53 y.o.   MRN: [id number]  HPI  54 year old patient who has a long history of insulin treated diabetes. At the present time he is on Lantus insulin 30 units twice daily. Approximately 3 weeks ago he underwent multiple dental extractions in his by mouth intake has been much less. Over this period time he has had  much more frequent episodes of hypoglycemia.  He also has a history of moderate chronic kidney disease with a creatinine of 1.5 and a creatinine clearance of approximately 50. He remains on metformin therapy. He states that for the past several nights he awakes between 1 and 2 AM to urinate and always feels quite poorly. He states blood sugars are quite low in the 50-70 range and his symptoms are properly by orange juice. He states fasting blood sugars are generally in the 1:30 to 180 range and are often less than 80 when he checks at noon. Late afternoon and early evening blood sugars he states are between 135 and 140.   He has taken Lantus 30 units earlier this morning.   Random blood sugar in the office was 66 requiring  supplemental caloric intake.  Past Medical History  Diagnosis Date  . DIABETES MELLITUS, TYPE II 09/28/2006  . HYPERLIPIDEMIA 09/28/2006  . HYPERTENSION 09/28/2006  . CEREBROVASCULAR ACCIDENT, HX OF 09/28/2006    2007 affected left side and peripheral vision  . GERD (gastroesophageal reflux disease)   . Stroke     History   Social History  . Marital Status: Divorced    Spouse Name: N/A    Number of Children: N/A  . Years of Education: N/A   Occupational History  . Not on file.   Social History Main Topics  . Smoking status: Current Every Day Smoker -- 1.00 packs/day    Types: Cigarettes  . Smokeless tobacco: Current User  . Alcohol Use: No  . Drug Use: No  . Sexual Activity: Not on file   Other Topics Concern  . Not on file   Social History Narrative  . No narrative on file     Past Surgical History  Procedure Laterality Date  . Rhinoplasty    . Lymphadenectomy    . Sigmoidoscopy      Family History  Problem Relation Age of Onset  . Lung cancer Mother   . Diabetes Father   . Stroke Father   . Alcohol abuse Brother   . Diabetes Brother   . Heart attack Sister     Allergies  Allergen Reactions  . Sulfamethoxazole     REACTION: rash    Current Outpatient Prescriptions on File Prior to Visit  Medication Sig Dispense Refill  . amLODipine (NORVASC) 5 MG tablet TAKE 1 TABLET EVERY DAY  90 tablet  1  . aspirin 81 MG tablet Take 81 mg by mouth daily.        . B-D INS SYRINGE 0.5CC/31GX5/16 31G X 5/16" 0.5 ML MISC USE AS DIRECTED  100 each  5  . gabapentin (NEURONTIN) 300 MG capsule TAKE 2 CAPSULES (600 MG TOTAL) BY MOUTH AT BEDTIME.  180 capsule  2  . hydrochlorothiazide (HYDRODIURIL) 25 MG tablet TAKE 1 TABLET BY MOUTH DAILY  30 tablet  5  . labetalol (NORMODYNE) 200 MG tablet TAKE 1 TABLET BY MOUTH TWICE A DAY  180 tablet  1  . losartan (COZAAR) 100 MG tablet TAKE 1 TABLET EVERY DAY  90 tablet  1  . metFORMIN (GLUCOPHAGE) 500 MG tablet TAKE 1 TABLET TWICE A DAY WITH MEALS  60 tablet  5  . omeprazole (PRILOSEC) 20 MG capsule Take 20 mg by mouth daily.        . ONE TOUCH ULTRA TEST test strip USE AS DIRECTED 4 TIMES A DAY  100 each  10  . simvastatin (ZOCOR) 20 MG tablet TAKE 1 TABLET AT BEDTIME  90 tablet  0   No current facility-administered medications on file prior to visit.    BP 150/80  Pulse 84  Temp(Src) 98 F (36.7 C) (Oral)  Resp 20  Ht 5\' 9"  (1.753 m)  Wt 190 lb (86.183 kg)  BMI 28.05 kg/m2  SpO2 98%      Review of Systems  Constitutional: Positive for fatigue. Negative for fever, chills and appetite change.  HENT: Negative for congestion, dental problem, ear pain, hearing loss, sore throat, tinnitus, trouble swallowing and voice change.   Eyes: Negative for pain, discharge and visual disturbance.  Respiratory: Negative for  cough, chest tightness, wheezing and stridor.   Cardiovascular: Negative for chest pain, palpitations and leg swelling.  Gastrointestinal: Negative for nausea, vomiting, abdominal pain, diarrhea, constipation, blood in stool and abdominal distention.  Genitourinary: Negative for urgency, hematuria, flank pain, discharge, difficulty urinating and genital sores.  Musculoskeletal: Negative for arthralgias, back pain, gait problem, joint swelling, myalgias and neck stiffness.  Skin: Negative for rash.  Neurological: Positive for weakness. Negative for dizziness, syncope, speech difficulty, numbness and headaches.  Hematological: Negative for adenopathy. Does not bruise/bleed easily.  Psychiatric/Behavioral: Positive for decreased concentration. Negative for behavioral problems and dysphoric mood. The patient is not nervous/anxious.        Objective:   Physical Exam  Constitutional: He appears well-developed and well-nourished. No distress.  Blood pressure 140/84  Psychiatric: He has a normal mood and affect. His behavior is normal. Judgment and thought content normal.          Assessment & Plan:   Symptomatic hypoglycemia.  Status post recent dental extractions with decreased by mouth intake.  Resolution of chronic dental infection may also be a factor of his decrease in insulin requirements. Chronic kidney disease of moderate severity  The patient has already taken Lantus 30 units earlier today and will continue on this regimen and discontinue at bedtime dose of additional Lantus. We'll check blood sugars are frequently. He has been asked to call the office later in the week to report blood sugars and to further titrate the Lantus therapy. May require mealtime insulin. The patient has no home computer and My Chart is not available  He is scheduled for an annual exam in 2 months

## 2013-05-30 NOTE — Progress Notes (Signed)
Pre-visit discussion using our clinic review tool. No additional management support is needed unless otherwise documented below in the visit note.  

## 2013-05-31 ENCOUNTER — Telehealth: Payer: Self-pay | Admitting: Internal Medicine

## 2013-05-31 NOTE — Telephone Encounter (Signed)
Relevant patient education mailed to patient.  

## 2013-06-01 ENCOUNTER — Other Ambulatory Visit: Payer: Self-pay | Admitting: Internal Medicine

## 2013-06-19 ENCOUNTER — Other Ambulatory Visit: Payer: Self-pay | Admitting: Family Medicine

## 2013-06-25 ENCOUNTER — Other Ambulatory Visit: Payer: Self-pay | Admitting: Family Medicine

## 2013-07-04 ENCOUNTER — Telehealth: Payer: Self-pay | Admitting: Internal Medicine

## 2013-07-04 NOTE — Telephone Encounter (Signed)
error 

## 2013-07-12 ENCOUNTER — Ambulatory Visit (INDEPENDENT_AMBULATORY_CARE_PROVIDER_SITE_OTHER): Payer: Medicare Other | Admitting: Family Medicine

## 2013-07-12 ENCOUNTER — Encounter: Payer: Self-pay | Admitting: Family Medicine

## 2013-07-12 VITALS — BP 120/80 | HR 60 | Temp 97.5°F | Wt 184.0 lb

## 2013-07-12 DIAGNOSIS — R634 Abnormal weight loss: Secondary | ICD-10-CM | POA: Diagnosis not present

## 2013-07-12 DIAGNOSIS — R197 Diarrhea, unspecified: Secondary | ICD-10-CM | POA: Diagnosis not present

## 2013-07-12 LAB — CBC WITH DIFFERENTIAL/PLATELET
Basophils Absolute: 0 10*3/uL (ref 0.0–0.1)
Basophils Relative: 0.3 % (ref 0.0–3.0)
EOS ABS: 0.4 10*3/uL (ref 0.0–0.7)
Eosinophils Relative: 3.1 % (ref 0.0–5.0)
HCT: 36.9 % — ABNORMAL LOW (ref 39.0–52.0)
Hemoglobin: 12 g/dL — ABNORMAL LOW (ref 13.0–17.0)
Lymphocytes Relative: 14 % (ref 12.0–46.0)
Lymphs Abs: 1.7 10*3/uL (ref 0.7–4.0)
MCHC: 32.6 g/dL (ref 30.0–36.0)
MCV: 85 fl (ref 78.0–100.0)
Monocytes Absolute: 1 10*3/uL (ref 0.1–1.0)
Monocytes Relative: 8 % (ref 3.0–12.0)
NEUTROS PCT: 74.6 % (ref 43.0–77.0)
Neutro Abs: 9.3 10*3/uL — ABNORMAL HIGH (ref 1.4–7.7)
Platelets: 293 10*3/uL (ref 150.0–400.0)
RBC: 4.34 Mil/uL (ref 4.22–5.81)
RDW: 13.7 % (ref 11.5–14.6)
WBC: 12.4 10*3/uL — ABNORMAL HIGH (ref 4.5–10.5)

## 2013-07-12 LAB — HEPATIC FUNCTION PANEL
ALT: 24 U/L (ref 0–53)
AST: 16 U/L (ref 0–37)
Albumin: 3.2 g/dL — ABNORMAL LOW (ref 3.5–5.2)
Alkaline Phosphatase: 115 U/L (ref 39–117)
BILIRUBIN TOTAL: 0.4 mg/dL (ref 0.3–1.2)
Bilirubin, Direct: 0.1 mg/dL (ref 0.0–0.3)
Total Protein: 7.6 g/dL (ref 6.0–8.3)

## 2013-07-12 LAB — BASIC METABOLIC PANEL
BUN: 24 mg/dL — AB (ref 6–23)
CHLORIDE: 101 meq/L (ref 96–112)
CO2: 29 mEq/L (ref 19–32)
CREATININE: 1.3 mg/dL (ref 0.4–1.5)
Calcium: 9.4 mg/dL (ref 8.4–10.5)
GFR: 63.21 mL/min (ref >60.00)
Glucose, Bld: 142 mg/dL — ABNORMAL HIGH (ref 70–99)
Potassium: 4.2 mEq/L (ref 3.5–5.1)
Sodium: 139 mEq/L (ref 135–145)

## 2013-07-12 LAB — TSH: TSH: 2.57 u[IU]/mL (ref 0.35–5.50)

## 2013-07-12 NOTE — Patient Instructions (Signed)
Lactose-Free Diet Lactose is a carbohydrate that is found mainly in milk and milk products, as well as in foods with added milk or whey. Lactose must be digested by the enzyme lactase in order to be used by the body. Lactose intolerance occurs when there is a shortage of lactase. When your body is not able to digest lactose, you may feel sick to your stomach (nausea), bloated, and have cramps, gas, and diarrhea. TYPES OF LACTASE DEFICIENCY  Primary lactase deficiency. This is the most common type. It is characterized by a slow decrease in lactase activity.  Secondary lactase deficiency. This occurs following injury to the small intestinal mucosa as a result of a disease or condition. It can also occur as a result of surgery or after treatment with antibiotic medicines or cancer drugs. Tolerance to lactose varies widely. Each person must determine how much milk can be consumed without developing symptoms. Drinking smaller portions of milk throughout the day may be helpful. Some studies suggest that slowing gastric emptying may help increase tolerance of milk products. This may be done by:  Consuming milk or milk products with a meal rather than alone.  Consuming milk with a higher fat content. There are many dairy products that may be tolerated better than milk by some people, including:  Cheese (especially aged cheese). The lactose content is much lower than in milk.  Cultured dairy products, such as yogurt, buttermilk, cottage cheese, and sweet acidophilus milk (kefir). These products are usually well tolerated by lactase-deficient people. This is because the healthy bacteria help digest lactose.  Lactose-hydrolyzed milk. This product contains 40% to 90% less lactose than milk and may also be well tolerated. ADEQUACY These diets may be deficient in calcium, riboflavin, and vitamin D, according to the Recommended Dietary Allowances of the [employer]. Depending on individual  tolerances and the use of milk substitutes, milk, or other dairy products, you may be able to meet these recommendations. SPECIAL NOTES  Lactose is a carbohydrate. The main food source for lactose is dairy products. Reading food labels is important. Many products contain lactose even when they are not made from milk. Look for the following words: whey, milk solids, dry milk solids, nonfat dry milk powder. Typical sources of lactose other than dairy products include breads, candies, cold cuts, prepared and processed foods, and commercial sauces and gravies.  All foods must be prepared without milk, cream, or other dairy foods.  A vitamin or mineral supplement may be necessary. Consult your caregiver or Registered Dietitian.  Lactose is also found in many prescription and over-the-counter medicines.  Soy milk and lactose-free supplements may be used as an alternative to milk. CHOOSING FOODS Breads and Starches  Allowed: Breads and rolls made without milk. [place], [place], or [place] bread. Soda crackers, graham crackers. Any crackers prepared without lactose. Cooked or dry cereals prepared without lactose (read labels). Any potatoes, pasta, or rice prepared without milk or lactose. Popcorn.  Avoid: Breads and rolls that contain milk. Prepared mixes such as muffins, biscuits, waffles, pancakes. Sweet rolls, donuts, [place] toast (if made with milk or lactose). Zwieback crackers, corn curls, or any crackers that contain lactose. Cooked or dry cereals prepared with lactose (read labels). Instant potatoes, frozen [place] fries, scalloped or au gratin potatoes. Vegetables  Allowed: Fresh, frozen, and canned vegetables.  Avoid: Creamed or breaded vegetables. Vegetables in a cheese sauce or with lactose-containing margarines. Fruit  Allowed: All fresh, canned, or frozen fruits that are not processed with lactose.  Avoid: Any canned or frozen fruits processed with lactose. Meat and Meat  Substitutes  Allowed: Plain beef, chicken, fish, [place], lamb, veal, pork, or ham. Kosher prepared meat products. Strained or junior meats that do not contain milk. Eggs, soy meat substitutes, nuts.  Avoid: Scrambled eggs, omelets, and souffles that contain milk. Creamed or breaded meat, fish, or fowl. Sausage products such as wieners, liver sausage, or cold cuts that contain milk solids. Cheese, cottage cheese, or cheese spreads. Milk  Allowed: None.  Avoid: Milk (whole, 2%, skim, or chocolate). Evaporated, powdered, or condensed milk. Malted milk. Soups and Combination Foods  Allowed: Bouillon, broth, vegetable soups, clear soups, consomms. Homemade soups made with allowed ingredients. Combination or prepared foods that do not contain milk or milk products (read labels).  Avoid: Cream soups, chowders, commercially prepared soups containing lactose. Macaroni and cheese, pizza. Combination or prepared foods that contain milk or milk products. Desserts and Sweets  Allowed: Water and fruit ices, gelatin, angel food cake. Homemade cookies, pies, or cakes made from allowed ingredients. Pudding (if made with water or a milk substitute). Lactose-free tofu desserts. Sugar, honey, corn syrup, jam, jelly, marmalade, molasses (beet sugar). Pure sugar candy, marshmallows.  Avoid: Ice cream, ice milk, sherbet, custard, pudding, frozen yogurt. Commercial cake and cookie mixes. Desserts that contain chocolate. Pie crust made with milk-containing margarine. Reduced calorie desserts made with a sugar substitute that contains lactose. Toffee, peppermint, butterscotch, chocolate, caramels. Fats and Oils  Allowed: Butter (as tolerated, contains very small amounts of lactose). Margarines and dressings that do not contain milk. Vegetable oils, shortening, mayonnaise, nondairy cream and whipped toppings without lactose or milk solids added. Berniece Salines.  Avoid: Margarines and salad dressings containing milk. Cream,  cream cheese, peanut butter with added milk solids, sour cream, chip dips made with sour cream. Beverages  Allowed: Carbonated drinks, tea, coffee and freeze-dried coffee, some instant coffees (check labels). Fruit drinks, fruit and vegetable juice, rice or soy milk.  Avoid: Hot chocolate. Some cocoas, some instant coffees, instant iced teas, powdered fruit drinks (read labels). Condiments  Allowed: Soy sauce, carob powder, olives, gravy made with water, baker's cocoa, pickles, pure seasonings and spices, wine, pure monosodium glutamate, catsup, mustard.  Avoid: Some chewing gums, chocolate, some cocoas. Certain antibiotics and vitamin or mineral preparations. Spice blends if they contain milk products. MSG extender. Artificial sweeteners that contain lactose. Some nondairy creamers (read labels). SAMPLE MENU Breakfast  Orange juice.  Banana.  Bran cereal.  Nondairy creamer.  [place] bread, toasted.  Butter or milk-free margarine.  Coffee or tea. Lunch  Chicken breast.  Rice.  Green beans.  Butter or milk-free margarine.  Fresh melon.  Coffee or tea. Dinner  [employer].  Baked potato.  Butter or milk-free margarine.  Broccoli.  Lettuce salad with vinegar and oil dressing.  [employer]  Coffee or tea. Document Released: 09/26/2001 Document Revised: 06/29/2011 Document Reviewed: 07/04/2010 [facility] Patient Information 2014 [place], [place]. Lactose Intolerance, Adult Lactose intolerance is when the body is not able to digest lactose, a sugar found in milk and milk products. Lactose intolerance is caused by your body not producing enough of the enzyme lactase. When there is not enough lactase to digest the amount of lactose consumed, discomfort may be felt. Lactose intolerance is not a milk allergy. For most people, lactase deficiency is a condition that develops naturally over time. After about the age of 2, the body begins to produce less lactase. But many  people may not experience symptoms  until they are much older. CAUSES Things that can cause you to be lactose intolerant include:  Aging.  Being born without the ability to make lactase.  Certain digestive diseases.  Injuries to the small intestine. SYMPTOMS   Feeling sick to your stomach (nauseous).  Diarrhea.  Cramps.  Bloating.  Gas. Symptoms usually show up a half hour or 2 hours after eating or drinking products containing lactose. TREATMENT  No treatment can improve the body's ability to produce lactase. However, symptoms can be controlled through diet. A medicine may be given to you to take when you consume lactose-containing foods or drinks. The medicine contains the lactase enzyme, which help the body digest lactose better. HOME CARE INSTRUCTIONS  Eat or drink dairy products as told by your caregiver or dietician.  Take all medicine as directed by your caregiver.  Find lactose-free or lactose-reduced products at your local grocery store.  Talk to your caregiver or dietician to decide if you need any dietary supplements. The following is the amount of calcium needed from the diet:  19 to 50 years: 1000 mg  Over 50 years: 1200 mg Calcium and Lactose in Common Foods Non-Dairy Products / Calcium Content (mg)  Calcium-fortified orange juice, 1 cup / 308 to 344 mg  Sardines, with edible bones, 3 oz / 270 mg  Salmon, canned, with edible bones, 3 oz / 205 mg  Soymilk, fortified, 1 cup / 200 mg  Broccoli (raw), 1 cup / 90 mg  Orange, 1 medium / 50 mg  Pinto beans,  cup / 40 mg  Tuna, canned, 3 oz / 10 mg  Lettuce greens,  cup / 10 mg Dairy Products / Calcium Content (mg) / Lactose Content (g)  Yogurt, plain, low-fat, 1 cup / 415 mg / 5 g  Milk, reduced fat, 1 cup / 295 mg / 11 g  Swiss cheese, 1 oz / 270 mg / 1 g  Ice cream,  cup / 85 mg / 6 g  Cottage cheese,  cup / 75 mg / 2 to 3 g SEEK MEDICAL CARE IF: You have no relief from your  symptoms. Document Released: 04/06/2005 Document Revised: 06/29/2011 Document Reviewed: 07/04/2010 [facility] Patient Information 2014 [place], [place].

## 2013-07-12 NOTE — Progress Notes (Signed)
Pre visit review using our clinic review tool, if applicable. No additional management support is needed unless otherwise documented below in the visit note. 

## 2013-07-12 NOTE — Progress Notes (Signed)
   Subjective:    Patient ID: William [name], male    DOB: Sep 28, 1960, 53 y.o.   MRN: [id number]  Diarrhea  Pertinent negatives include no abdominal pain, chills, coughing, fever or vomiting.   Patient seen with one month history of diarrhea. He states he had some teeth pulled per dentist 1 month ago and symptoms seemed to start after that. He denies any recent antibiotics. No recent travels. He describes generally 2-3 loose to watery stools per day. He has had some mild weight loss recently but no appetite change. No bloody stools. No other recent medication changes. He has type 2 diabetes which has generally been well controlled.  Patient had colonoscopy 2012 which showed diverticulosis. No history of celiac disease. He has on his own discontinued milked containing products over the past few days and symptoms seem to be resolving. Denies any fevers or chills. No abdominal pain.  Past Medical History  Diagnosis Date  . DIABETES MELLITUS, TYPE II 09/28/2006  . HYPERLIPIDEMIA 09/28/2006  . HYPERTENSION 09/28/2006  . CEREBROVASCULAR ACCIDENT, HX OF 09/28/2006    2007 affected left side and peripheral vision  . GERD (gastroesophageal reflux disease)   . Stroke    Past Surgical History  Procedure Laterality Date  . Rhinoplasty    . Lymphadenectomy    . Sigmoidoscopy      reports that he has been smoking Cigarettes.  He has been smoking about 1.00 pack per day. He uses smokeless tobacco. He reports that he does not drink alcohol or use illicit drugs. family history includes Alcohol abuse in his brother; Diabetes in his brother and father; Heart attack in his sister; Lung cancer in his mother; Stroke in his father. Allergies  Allergen Reactions  . Sulfamethoxazole     REACTION: rash      Review of Systems  Constitutional: Positive for unexpected weight change. Negative for fever, chills and appetite change.  Respiratory: Negative for cough and shortness of breath.   Gastrointestinal:  Positive for diarrhea. Negative for nausea, vomiting, abdominal pain, constipation and blood in stool.  Skin: Negative for rash.       Objective:   Physical Exam  Constitutional: He appears well-developed and well-nourished.  HENT:  Mouth/Throat: Oropharynx is clear and moist.  Neck: Neck supple. No thyromegaly present.  Cardiovascular: Normal rate.  Exam reveals no gallop.   Pulmonary/Chest: Effort normal and breath sounds normal. No respiratory distress. He has no wheezes. He has no rales.  Abdominal: Soft. Bowel sounds are normal. He exhibits no distension. There is no tenderness. There is no rebound and no guarding.  Musculoskeletal: He exhibits no edema.  Skin: No rash noted.          Assessment & Plan:  Patient presents with one month history of diarrhea and modest weight loss. Nonfocal exam. He has recently noted some improvement with reducing lactose containing foods. Check labs with CBC, basic chemistries, TSH. Continue to avoid lactose containing foods. Recent colonoscopy as above. Touch base if diarrhea persists off lactose containing foods

## 2013-07-31 ENCOUNTER — Other Ambulatory Visit: Payer: Self-pay | Admitting: Internal Medicine

## 2013-08-03 ENCOUNTER — Other Ambulatory Visit: Payer: Self-pay | Admitting: Internal Medicine

## 2013-08-14 ENCOUNTER — Other Ambulatory Visit: Payer: Self-pay | Admitting: Internal Medicine

## 2013-08-14 ENCOUNTER — Other Ambulatory Visit: Payer: Self-pay | Admitting: Family Medicine

## 2013-08-15 ENCOUNTER — Other Ambulatory Visit: Payer: Self-pay | Admitting: Family Medicine

## 2013-08-15 ENCOUNTER — Other Ambulatory Visit: Payer: Self-pay | Admitting: Internal Medicine

## 2013-08-26 ENCOUNTER — Other Ambulatory Visit: Payer: Self-pay | Admitting: Internal Medicine

## 2013-09-02 ENCOUNTER — Other Ambulatory Visit: Payer: Self-pay | Admitting: Internal Medicine

## 2013-09-08 ENCOUNTER — Ambulatory Visit: Payer: Medicare Other | Admitting: Internal Medicine

## 2013-09-13 ENCOUNTER — Encounter: Payer: Self-pay | Admitting: Internal Medicine

## 2013-09-13 ENCOUNTER — Ambulatory Visit: Payer: Medicare Other | Admitting: Internal Medicine

## 2013-09-13 ENCOUNTER — Ambulatory Visit (INDEPENDENT_AMBULATORY_CARE_PROVIDER_SITE_OTHER): Payer: Medicare Other | Admitting: Internal Medicine

## 2013-09-13 VITALS — BP 100/56 | HR 76 | Temp 97.8°F | Ht 69.0 in | Wt 173.0 lb

## 2013-09-13 DIAGNOSIS — G8929 Other chronic pain: Secondary | ICD-10-CM

## 2013-09-13 DIAGNOSIS — E119 Type 2 diabetes mellitus without complications: Secondary | ICD-10-CM | POA: Diagnosis not present

## 2013-09-13 DIAGNOSIS — R1011 Right upper quadrant pain: Secondary | ICD-10-CM

## 2013-09-13 DIAGNOSIS — R197 Diarrhea, unspecified: Secondary | ICD-10-CM

## 2013-09-13 LAB — CBC WITH DIFFERENTIAL/PLATELET
Basophils Absolute: 0.1 10*3/uL (ref 0.0–0.1)
Basophils Relative: 0.4 % (ref 0.0–3.0)
EOS ABS: 0.3 10*3/uL (ref 0.0–0.7)
Eosinophils Relative: 2.4 % (ref 0.0–5.0)
HEMATOCRIT: 34.3 % — AB (ref 39.0–52.0)
Hemoglobin: 11.2 g/dL — ABNORMAL LOW (ref 13.0–17.0)
Lymphocytes Relative: 12.3 % (ref 12.0–46.0)
Lymphs Abs: 1.4 10*3/uL (ref 0.7–4.0)
MCHC: 32.6 g/dL (ref 30.0–36.0)
MCV: 84.8 fl (ref 78.0–100.0)
Monocytes Absolute: 0.9 10*3/uL (ref 0.1–1.0)
Monocytes Relative: 8.2 % (ref 3.0–12.0)
Neutro Abs: 8.8 10*3/uL — ABNORMAL HIGH (ref 1.4–7.7)
Neutrophils Relative %: 76.7 % (ref 43.0–77.0)
PLATELETS: 314 10*3/uL (ref 150.0–400.0)
RBC: 4.04 Mil/uL — ABNORMAL LOW (ref 4.22–5.81)
RDW: 15.2 % (ref 11.5–15.5)
WBC: 11.5 10*3/uL — ABNORMAL HIGH (ref 4.0–10.5)

## 2013-09-13 LAB — LIPID PANEL
Cholesterol: 128 mg/dL (ref 0–200)
HDL: 24.8 mg/dL — ABNORMAL LOW (ref >39.00)
LDL CALC: 80 mg/dL (ref 0–99)
Total CHOL/HDL Ratio: 5
Triglycerides: 115 mg/dL (ref 0.0–149.0)
VLDL: 23 mg/dL (ref 0.0–40.0)

## 2013-09-13 LAB — BASIC METABOLIC PANEL
BUN: 16 mg/dL (ref 6–23)
CHLORIDE: 98 meq/L (ref 96–112)
CO2: 31 mEq/L (ref 19–32)
Calcium: 9.2 mg/dL (ref 8.4–10.5)
Creatinine, Ser: 1.1 mg/dL (ref 0.4–1.5)
GFR: 72.36 mL/min (ref >60.00)
Glucose, Bld: 122 mg/dL — ABNORMAL HIGH (ref 70–99)
Potassium: 3.8 mEq/L (ref 3.5–5.1)
SODIUM: 138 meq/L (ref 135–145)

## 2013-09-13 LAB — HEPATIC FUNCTION PANEL
ALT: 18 U/L (ref 0–53)
AST: 14 U/L (ref 0–37)
Albumin: 2.8 g/dL — ABNORMAL LOW (ref 3.5–5.2)
Alkaline Phosphatase: 105 U/L (ref 39–117)
BILIRUBIN TOTAL: 0.5 mg/dL (ref 0.2–1.2)
Bilirubin, Direct: 0.1 mg/dL (ref 0.0–0.3)
Total Protein: 7.3 g/dL (ref 6.0–8.3)

## 2013-09-13 LAB — MICROALBUMIN / CREATININE URINE RATIO
CREATININE, U: 150.3 mg/dL
Microalb Creat Ratio: 163.7 mg/g — ABNORMAL HIGH (ref 0.0–30.0)
Microalb, Ur: 246 mg/dL — ABNORMAL HIGH (ref 0.0–1.9)

## 2013-09-13 LAB — HEMOGLOBIN A1C: Hgb A1c MFr Bld: 7.3 % — ABNORMAL HIGH (ref 4.6–6.5)

## 2013-09-13 LAB — SEDIMENTATION RATE: Sed Rate: 111 mm/hr — ABNORMAL HIGH (ref 0–22)

## 2013-09-13 LAB — HM DIABETES FOOT EXAM

## 2013-09-13 NOTE — Progress Notes (Signed)
Pre visit review using our clinic review tool, if applicable. No additional management support is needed unless otherwise documented below in the visit note. 

## 2013-09-13 NOTE — Progress Notes (Signed)
4 months ago had teeth pulled Since that time he has felt unwell. Poor appetite, diarrhea. He describes an aversion to food (smell), fatigue. He has some RUQ pain (2-3 months). He has attributed pain to "sleeping funny" (position)  Seen by dr Elease Hashimoto in march- reviewed labs. Pt has 5-6 BMs per day. He did take ABX after teeth extraction. Colonoscopy 2012- reviewed  DM2- last a1c = 7.7%  Past Medical History  Diagnosis Date  . DIABETES MELLITUS, TYPE II 09/28/2006  . HYPERLIPIDEMIA 09/28/2006  . HYPERTENSION 09/28/2006  . CEREBROVASCULAR ACCIDENT, HX OF 09/28/2006    2007 affected left side and peripheral vision  . GERD (gastroesophageal reflux disease)   . Stroke     History   Social History  . Marital Status: Divorced    Spouse Name: N/A    Number of Children: N/A  . Years of Education: N/A   Occupational History  . Not on file.   Social History Main Topics  . Smoking status: Current Every Day Smoker -- 1.00 packs/day    Types: Cigarettes  . Smokeless tobacco: Current User  . Alcohol Use: No  . Drug Use: No  . Sexual Activity: Not on file   Other Topics Concern  . Not on file   Social History Narrative  . No narrative on file    Past Surgical History  Procedure Laterality Date  . Rhinoplasty    . Lymphadenectomy    . Sigmoidoscopy      Family History  Problem Relation Age of Onset  . Lung cancer Mother   . Diabetes Father   . Stroke Father   . Alcohol abuse Brother   . Diabetes Brother   . Heart attack Sister     Allergies  Allergen Reactions  . Sulfamethoxazole     REACTION: rash    Current Outpatient Prescriptions on File Prior to Visit  Medication Sig Dispense Refill  . amLODipine (NORVASC) 5 MG tablet TAKE 1 TABLET BY MOUTH DAILY  90 tablet  1  . amLODipine (NORVASC) 5 MG tablet TAKE 1 TABLET BY MOUTH DAILY  90 tablet  0  . aspirin 81 MG tablet Take 81 mg by mouth daily.        . B-D INS SYRINGE 0.5CC/31GX5/16 31G X 5/16" 0.5 ML MISC USE AS  DIRECTED  100 each  5  . gabapentin (NEURONTIN) 300 MG capsule TAKE 2 CAPSULES (600 MG TOTAL) BY MOUTH AT BEDTIME.  180 capsule  2  . hydrochlorothiazide (HYDRODIURIL) 25 MG tablet TAKE 1 TABLET BY MOUTH DAILY  30 tablet  5  . labetalol (NORMODYNE) 200 MG tablet TAKE 1 TABLET BY MOUTH TWICE A DAY  180 tablet  1  . losartan (COZAAR) 100 MG tablet TAKE 1 TABLET EVERY DAY  90 tablet  1  . metFORMIN (GLUCOPHAGE) 500 MG tablet TAKE 1 TABLET TWICE A DAY WITH MEALS  60 tablet  5  . metFORMIN (GLUCOPHAGE) 500 MG tablet TAKE 1 TABLET TWICE A DAY WITH MEALS  60 tablet  3  . omeprazole (PRILOSEC) 20 MG capsule Take 20 mg by mouth daily.        . ONE TOUCH ULTRA TEST test strip USE AS DIRECTED 4 TIMES A DAY  100 each  10  . simvastatin (ZOCOR) 20 MG tablet TAKE 1 TABLET AT BEDTIME  90 tablet  0   No current facility-administered medications on file prior to visit.     patient denies chest pain, shortness of breath, orthopnea. Denies  lower extremity edema, abdominal pain, change in appetite, change in bowel movements. Patient denies rashes, musculoskeletal complaints. No other specific complaints in a complete review of systems.   BP 100/56  Pulse 76  Temp(Src) 97.8 F (36.6 C) (Oral)  Ht 5\' 9"  (1.753 m)  Wt 173 lb (78.472 kg)  BMI 25.54 kg/m2  well-developed well-nourished male in no acute distress. HEENT exam atraumatic, normocephalic, neck supple without jugular venous distention. Chest clear to auscultation cardiac exam S1-S2 are regular. Abdominal exam overweight with bowel sounds, soft and tender to palaption of RUQ. Extremities no edema. Neurologic exam is alert with a normal gait.   Right upper quadrant pain: Unclear etiology. Will check laboratory work. He may need ultrasound or other imaging of the abdomen.  It's unclear to me what is causing his symptoms of pain and diarrhea. I will discontinue the metformin for now. I will also discontinue gabapentin.

## 2013-09-13 NOTE — Patient Instructions (Addendum)
Stop Metformin stop neurontin (gabapentin)

## 2013-09-15 ENCOUNTER — Telehealth: Payer: Self-pay | Admitting: Internal Medicine

## 2013-09-15 DIAGNOSIS — R197 Diarrhea, unspecified: Secondary | ICD-10-CM

## 2013-09-15 LAB — CLOSTRIDIUM DIFFICILE BY PCR: Toxigenic C. Difficile by PCR: NOT DETECTED

## 2013-09-15 NOTE — Telephone Encounter (Signed)
Patient Information:  Caller Name: [name]  Phone: [phone]  Patient: William [name], William [name]  Gender: Male  DOB: Sep 18, 1960  Age: 53 Years  PCP: Phoebe Sharps (Adults only)  Office Follow Up:  Does the office need to follow up with this patient?: Yes  Instructions For The Office: requesting call from Dr. Leanne Chang with lab results  RN Note:  Hx of loose stools since stroke but not as much as he has had recently. Advised to eat yogurt daily and to try some sugar free sports drinks and avoid caffeine.  Symptoms  Reason For Call & Symptoms: Seen in the office on 09/13/13 and taken off BP meds. He has been having diarrhea 2-5x daily for past 3 months ever since had upper teeth extracted on 06/23/13.[name] He has decreased appetite and no energy. He is drinking plenty of fluids- water, green tea and diet Dr. Malachi Bonds and voiding QS.  Afebrile. He contunues to have weakness and is sleeping more than ususual. BP =120/72 today. BG =85 this morning. He is upset and cries frequently because he doesn't feel well.  Reviewed Health History In EMR: Yes  Reviewed Medications In EMR: Yes  Reviewed Allergies In EMR: Yes  Reviewed Surgeries / Procedures: Yes  Date of Onset of Symptoms: 06/23/2013  Guideline(s) Used:  Weakness (Generalized) and Fatigue  Diarrhea  Disposition Per Guideline:   See Within 2 Weeks in Office  Reason For Disposition Reached:   Diarrhea is a chronic symptom (recurrent or ongoing AND lasting > 4 weeks)  Advice Given:  Reassurance:  In healthy adults, new-onset diarrhea is usually caused by a viral infection of the intestines, which you can treat at home. Diarrhea is the body's way of getting rid of the infection. Here are some tips on how to keep ahead of the fluid losses.  Here is some care advice that should help.  Fluids:  Drink more fluids, at least 8-10 glasses (8 oz or 240 ml) daily.  Supplement this with saltine crackers or soups to make certain that you are getting sufficient  fluid and salt to meet your body's needs.  Nutrition:  Maintaining some food intake during episodes of diarrhea is important.  Ideal initial foods include boiled starches/cereals (e.g., potatoes, rice, noodles, wheat, oats) with a small amount of salt to taste.  Other acceptable foods include: bananas, yogurt, crackers, soup.  Call Back If:  Signs of dehydration occur (e.g., no urine for more than 12 hours, very dry mouth, lightheaded, etc.)  Diarrhea lasts over 7 days  You become worse.  Patient Refused Recommendation:  Patient Refused Appt, Patient Requests Appt At Later Date  Wanting Dr. Leanne Chang to call with lab results.

## 2013-09-18 ENCOUNTER — Telehealth: Payer: Self-pay | Admitting: Internal Medicine

## 2013-09-18 MED ORDER — SIMVASTATIN 20 MG PO TABS: ORAL_TABLET | ORAL | Status: DC

## 2013-09-18 NOTE — Telephone Encounter (Signed)
[id number] [place], [place] - [address] is requesting re-fill on simvastatin (ZOCOR) 20 MG tablet

## 2013-09-18 NOTE — Telephone Encounter (Signed)
rx sent in electronically 

## 2013-09-20 NOTE — Telephone Encounter (Signed)
I think he may have an inflamed colon based on his lab work-- will you refer to Dr. Ardis Hughs or his PA? This week would be great

## 2013-09-21 ENCOUNTER — Telehealth: Payer: Self-pay

## 2013-09-21 NOTE — Telephone Encounter (Signed)
Left message for pt to call back  °

## 2013-09-21 NOTE — Telephone Encounter (Signed)
Pt has been notified.

## 2013-09-21 NOTE — Telephone Encounter (Signed)
Pt aware, referral order placed

## 2013-09-21 NOTE — Telephone Encounter (Signed)
Message copied by Barron Alvine on Thu Sep 21, 2013  8:22 AM ------      Message from: Owens Loffler P      Created: Thu Sep 21, 2013  7:57 AM       Wilfred Lacy get him in.             Demetria Iwai,      He needs rov (wait list if nothing available in next 5-7 days, offer appt with extender as well if nothing availble ion next 5-7 days.            Thanks            ----- Message -----         From: Lisabeth Pick, MD         Sent: 09/20/2013   5:11 PM           To: Milus Banister, MD            Dan, this patient has had significant diarrhea for a few months associated with weight loss. He also has an ESR>100 mm/hr.            ? IBD???      Will you see him. I'll get my office to set something up.            Thanks, Bruce       ------

## 2013-09-21 NOTE — Telephone Encounter (Signed)
FYI Dr Leanne Chang 09/25/13 2 PM [facility]

## 2013-09-22 ENCOUNTER — Ambulatory Visit
Admission: RE | Admit: 2013-09-22 | Discharge: 2013-09-22 | Disposition: A | Discharge status: Home / Self Care | Payer: Medicare Other | Source: Ambulatory Visit | Attending: Internal Medicine | Admitting: Internal Medicine

## 2013-09-22 DIAGNOSIS — N281 Cyst of kidney, acquired: Secondary | ICD-10-CM | POA: Diagnosis not present

## 2013-09-22 DIAGNOSIS — G8929 Other chronic pain: Secondary | ICD-10-CM

## 2013-09-22 DIAGNOSIS — R1011 Right upper quadrant pain: Principal | ICD-10-CM

## 2013-09-25 ENCOUNTER — Encounter: Payer: Self-pay | Admitting: Physician Assistant

## 2013-09-25 ENCOUNTER — Ambulatory Visit (INDEPENDENT_AMBULATORY_CARE_PROVIDER_SITE_OTHER): Payer: Medicare Other | Admitting: Physician Assistant

## 2013-09-25 VITALS — BP 134/76 | HR 84 | Ht 67.75 in | Wt 170.0 lb

## 2013-09-25 DIAGNOSIS — R1319 Other dysphagia: Secondary | ICD-10-CM | POA: Diagnosis not present

## 2013-09-25 DIAGNOSIS — K219 Gastro-esophageal reflux disease without esophagitis: Secondary | ICD-10-CM | POA: Diagnosis not present

## 2013-09-25 DIAGNOSIS — R109 Unspecified abdominal pain: Secondary | ICD-10-CM | POA: Diagnosis not present

## 2013-09-25 DIAGNOSIS — R634 Abnormal weight loss: Secondary | ICD-10-CM

## 2013-09-25 MED ORDER — METRONIDAZOLE 250 MG PO TABS: 250.0000 mg | ORAL_TABLET | Freq: Four times a day (QID) | ORAL | Status: AC

## 2013-09-25 NOTE — Progress Notes (Signed)
Subjective:    Patient ID: William [name], male    DOB: 04-16-1961, 53 y.o.   MRN: [id number]  HPI  William [name] is a pleasant 71 old white male known to Dr. Ardis Hughs from prior screening colonoscopy. 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 He also had mild diverticulosis, no colon polyps. He is currently referred by Dr.Swords for evaluation of new onset of diarrhea, weight loss, and a markedly elevated sedimentation rate at greater than 100. Patient has history of adult-onset diabetes mellitus, hypertension, hyperlipidemia, GERD, and had a CVA proximally 7 years ago. Patient relates that he had his upper teeth removed about 4 months ago and took a course of antibiotics for about 7 days and that the diarrhea started around that same time period he says he has had diarrhea ever since that his symptoms fluctuates him. He may have anywhere from 4-7 bowel movements per day the yesterday he only had 2. He says his stool is loose and frequently floats. There is no melena or hematochezia. He has had some intermittent right-sided abdominal pain as well over the past couple of months. His appetite has been decreased and he says the smell of food often bothers him and he is just not hungry. He is definitely eating less and his weight is down 11-12 pounds. Is not aware of any specific food intolerances. He has not been on any other new meds or antibiotics. Stool for C. difficile by PCR was done last week and this was negative most recent hemoglobin A1c 7.3 .E lectrolytes within normal limits, liver tests normal, albumin 2.8 ,cholesterol 128, and triglycerides of 115 ,sedimentation rate greater than 100 ,WBC was 11.5 hemoglobin 11.2, hematocrit 34.3 and MCV of 84.    Review of Systems  Constitutional: Positive for appetite change and unexpected weight change.  HENT: Negative.   Eyes: Negative.     Respiratory: Negative.   Cardiovascular: Negative.   Gastrointestinal: Positive for abdominal pain and diarrhea.  Endocrine: Negative.   Genitourinary: Negative.   Musculoskeletal: Negative.   Skin: Negative.   Allergic/Immunologic: Negative.   Neurological: Negative.   Hematological: Negative.   Psychiatric/Behavioral: Negative.    Outpatient Prescriptions Prior to Visit  Medication Sig Dispense Refill  . amLODipine (NORVASC) 5 MG tablet TAKE 1 TABLET BY MOUTH DAILY  90 tablet  1  . aspirin 81 MG tablet Take 81 mg by mouth daily.        . B-D INS SYRINGE 0.5CC/31GX5/16 31G X 5/16" 0.5 ML MISC USE AS DIRECTED  100 each  5  . hydrochlorothiazide (HYDRODIURIL) 25 MG tablet TAKE 1 TABLET BY MOUTH DAILY  30 tablet  5  . insulin glargine (LANTUS) 100 UNIT/ML injection INJECT 30 UNITS INTO THE SKIN ONCE DAILY      . labetalol (NORMODYNE) 200 MG tablet TAKE 1 TABLET BY MOUTH TWICE A DAY  180 tablet  1  . losartan (COZAAR) 100 MG tablet TAKE 1 TABLET EVERY DAY  90 tablet  1  . omeprazole (PRILOSEC) 20 MG capsule Take 20 mg by mouth daily.        . ONE TOUCH ULTRA TEST test strip USE AS DIRECTED 4 TIMES A DAY  100 each  10  . simvastatin (ZOCOR) 20 MG tablet TAKE 1 TABLET AT BEDTIME  90 tablet  1   No facility-administered medications prior to visit.  Allergies  Allergen Reactions  . Sulfamethoxazole     REACTION: rash   Patient Active Problem List   Diagnosis Date Noted  . GERD (gastroesophageal reflux disease) 09/25/2013  . ONYCHOMYCOSIS, TOENAILS 03/22/2007  . DIABETES MELLITUS, TYPE II 09/28/2006  . HYPERLIPIDEMIA 09/28/2006  . HYPERTENSION 09/28/2006  . CEREBROVASCULAR ACCIDENT, HX OF 09/28/2006   History  Substance Use Topics  . Smoking status: Current Every Day Smoker -- 1.00 packs/day    Types: Cigarettes  . Smokeless tobacco: Current User  . Alcohol Use: No   family history includes Alcohol abuse in his brother; Diabetes in his brother and father; Heart attack in  his sister; Lung cancer in his mother; Stroke in his father.     Objective:   Physical Exam well-developed white male in no acute distress accompanied by his wife blood pressure 134 or 76 pulse 84 height 5 foot 7 weight 170. HEENT; nontraumatic normocephalic EOMI PERRLA sclera anicteric, Supple; no JVD, Cardiovascular; regular rate and rhythm with S1-S2 no murmur or gallop, Pulmonary; clear bilaterally, Abdomen ;soft, he has some mild tenderness in the right mid quadrant , no guarding or rebound, no palpable mass ,or hepatosplenomegaly bowel sounds are present, Rectal; exam not done, Extremities; no clubbing ,cyanosis or edema, skin warm and dry, Psych; mood and affect normal and appropriate.        Assessment & Plan:   #74 53 year old male with 3-4 month history of diarrhea, right-sided abdominal pain, anorexia and weight loss. His symptoms started around that time he had taken a course of antibiotics but recent stool for C. difficile PCR was negative. Rule out infectious, rule out underlying IBD or microscopic colitis. #2 adult onset diabetes mellitus #3 history of CVA #4 GERD #5 history of hyperlipidemia  #1 Empiric trial of probiotics-samples of Restora given today #2 empiric course of metronidazole 250 mg 4 times daily with food x14 days Schedule for colonoscopy with Dr. Ardis Hughs with plans for random biopsies. Procedure was discussed in detail with the patient and he is agreeable to proceed Further plans pending results of above

## 2013-09-25 NOTE — Patient Instructions (Signed)
We sent a prescription to [facility] for the Flagyl antibiotic.  We have given you samples of the Restroa probiotic. [facility]. Take for 1 month. Take 1 daily. We have given you a sample prep for the colonoscopy.   You have been scheduled for a colonoscopy with propofol. Please follow written instructions given to you at your visit today.   If you use inhalers (even only as needed), please bring them with you on the day of your procedure. Your physician has requested that you go to www.startemmi.com and enter the access code given to you at your visit today. This web site gives a general overview about your procedure. However, you should still follow specific instructions given to you by our office regarding your preparation for the procedure.

## 2013-09-26 NOTE — Progress Notes (Signed)
i agree with empiric abx given the timing of his symptoms and scheduling a colonoscopy

## 2013-09-29 ENCOUNTER — Encounter: Payer: Self-pay | Admitting: Gastroenterology

## 2013-09-29 ENCOUNTER — Ambulatory Visit (AMBULATORY_SURGERY_CENTER): Payer: Medicare Other | Admitting: Gastroenterology

## 2013-09-29 ENCOUNTER — Other Ambulatory Visit: Payer: Self-pay | Admitting: Gastroenterology

## 2013-09-29 VITALS — BP 166/75 | HR 68 | Temp 96.6°F | Resp 16 | Ht 67.75 in | Wt 170.0 lb

## 2013-09-29 DIAGNOSIS — R1310 Dysphagia, unspecified: Secondary | ICD-10-CM | POA: Diagnosis not present

## 2013-09-29 DIAGNOSIS — I10 Essential (primary) hypertension: Secondary | ICD-10-CM | POA: Diagnosis not present

## 2013-09-29 DIAGNOSIS — R109 Unspecified abdominal pain: Secondary | ICD-10-CM

## 2013-09-29 DIAGNOSIS — R197 Diarrhea, unspecified: Secondary | ICD-10-CM | POA: Diagnosis not present

## 2013-09-29 DIAGNOSIS — K6389 Other specified diseases of intestine: Secondary | ICD-10-CM

## 2013-09-29 DIAGNOSIS — K219 Gastro-esophageal reflux disease without esophagitis: Secondary | ICD-10-CM | POA: Diagnosis not present

## 2013-09-29 DIAGNOSIS — D126 Benign neoplasm of colon, unspecified: Secondary | ICD-10-CM | POA: Diagnosis not present

## 2013-09-29 DIAGNOSIS — E119 Type 2 diabetes mellitus without complications: Secondary | ICD-10-CM | POA: Diagnosis not present

## 2013-09-29 DIAGNOSIS — R634 Abnormal weight loss: Secondary | ICD-10-CM | POA: Diagnosis not present

## 2013-09-29 DIAGNOSIS — R933 Abnormal findings on diagnostic imaging of other parts of digestive tract: Secondary | ICD-10-CM

## 2013-09-29 LAB — GLUCOSE, CAPILLARY
GLUCOSE-CAPILLARY: 60 mg/dL — AB (ref 70–99)
Glucose-Capillary: 161 mg/dL — ABNORMAL HIGH (ref 70–99)

## 2013-09-29 MED ORDER — DEXTROSE 5 % IV SOLN: INTRAVENOUS | Status: DC

## 2013-09-29 NOTE — Op Note (Signed)
[place]  [address] [place], [id number]   COLONOSCOPY PROCEDURE REPORT  PATIENT: William [name], William [name]  MR#: [id number] BIRTHDATE: Apr 30, 1960 , 52  yrs. old GENDER: Male ENDOSCOPIST: Milus Banister, MD PROCEDURE DATE:  09/29/2013 PROCEDURE:   Colonoscopy with biopsy First Screening Colonoscopy - Avg.  risk and is 50 yrs.  old or older - No.  Prior Negative Screening - Now for repeat screening. N/A  History of Adenoma - Now for follow-up colonoscopy & has been > or = to 3 yrs.  N/A  Polyps Removed Today? No.  Recommend repeat exam, <10 yrs? No. ASA CLASS:   Class III INDICATIONS:diarrhea for several months (since oral surgery), C. diff PCR negative, sed rate >100. MEDICATIONS: MAC sedation, administered by CRNA and propofol (Diprivan) 400mg  IV  DESCRIPTION OF PROCEDURE:   After the risks benefits and alternatives of the procedure were thoroughly explained, informed consent was obtained.  A digital rectal exam revealed no abnormalities of the rectum.   The LB [id number]  endoscope was introduced through the anus and advanced to the cecum, which was identified by the ileocecal valve. No adverse events experienced.   The quality of the prep was adequate.  The instrument was then slowly withdrawn as the colon was fully examined.  COLON FINDINGS: There was residual liquid stool.  The mucosa in the cecum and ascending colon was unusually hypervascular without other signs of iflammation.  The mucosa throughout the rest of the colon was normal to mildly erythematous appearing.  Multiple biospies were taken from cecum, transverse, descending and rectosigmoid.  I attempted ileal intubation several times but was not successfuly. This seemed more due to angulation than underlying stricture. Retroflexed views revealed no abnormalities. The time to cecum=4 minutes 12 seconds.  Withdrawal time=13 minutes 28 seconds.  The scope was withdrawn and the procedure  completed. COMPLICATIONS: There were no complications.  ENDOSCOPIC IMPRESSION: See above.  Atypical appearing colitis?  Inflammatory, partially treated infectious?  RECOMMENDATIONS: Await final pathology.  Since you have noticed that empiric flagyl (started 2-3 days ago) has already helped somewhat, please continue the antibiotic.  Also, please start twice daily (scheduled) over the counter imodim.   eSigned:  Milus Banister, MD 09/29/2013 10:48 AM   cc: Phoebe Sharps, MD

## 2013-09-29 NOTE — Patient Instructions (Addendum)
YOU HAD AN ENDOSCOPIC PROCEDURE TODAY AT THE [facility]: Refer to the procedure report that was given to you for any specific questions about what was found during the examination.  If the procedure report does not answer your questions, please call your gastroenterologist to clarify.  If you requested that your care partner not be given the details of your procedure findings, then the procedure report has been included in a sealed envelope for you to review at your convenience later.  YOU SHOULD EXPECT: Some feelings of bloating in the abdomen. Passage of more gas than usual.  Walking can help get rid of the air that was put into your GI tract during the procedure and reduce the bloating. If you had a lower endoscopy (such as a colonoscopy or flexible sigmoidoscopy) you may notice spotting of blood in your stool or on the toilet paper. If you underwent a bowel prep for your procedure, then you may not have a normal bowel movement for a few days.  DIET: Your first meal following the procedure should be a light meal and then it is ok to progress to your normal diet.  A half-sandwich or bowl of soup is an example of a good first meal.  Heavy or fried foods are harder to digest and may make you feel nauseous or bloated.  Likewise meals heavy in dairy and vegetables can cause extra gas to form and this can also increase the bloating.  Drink plenty of fluids but you should avoid alcoholic beverages for 24 hours.  ACTIVITY: Your care partner should take you home directly after the procedure.  You should plan to take it easy, moving slowly for the rest of the day.  You can resume normal activity the day after the procedure however you should NOT DRIVE or use heavy machinery for 24 hours (because of the sedation medicines used during the test).    SYMPTOMS TO REPORT IMMEDIATELY: A gastroenterologist can be reached at any hour.  During normal business hours, 8:30 AM to 5:00 PM Monday through Friday,  call [phone].  After hours and on weekends, please call the GI answering service at [phone] who will take a message and have the physician on call contact you.   Following lower endoscopy (colonoscopy or flexible sigmoidoscopy):  Excessive amounts of blood in the stool  Significant tenderness or worsening of abdominal pains  Swelling of the abdomen that is new, acute  Fever of 100F or higher  Following upper endoscopy (EGD)  Vomiting of blood or coffee ground material  New chest pain or pain under the shoulder blades  Painful or persistently difficult swallowing  New shortness of breath  Fever of 100F or higher  Black, tarry-looking stools  FOLLOW UP: If any biopsies were taken you will be contacted by phone or by letter within the next 1-3 weeks.  Call your gastroenterologist if you have not heard about the biopsies in 3 weeks.  Our staff will call the home number listed on your records the next business day following your procedure to check on you and address any questions or concerns that you may have at that time regarding the information given to you following your procedure. This is a courtesy call and so if there is no answer at the home number and we have not heard from you through the emergency physician on call, we will assume that you have returned to your regular daily activities without incident.  SIGNATURES/CONFIDENTIALITY: You and/or your care   partner have signed paperwork which will be entered into your electronic medical record.  These signatures attest to the fact that that the information above on your After Visit Summary has been reviewed and is understood.  Full responsibility of the confidentiality of this discharge information lies with you and/or your care-partner.  Recommendations Continue Flagyl (antibiotic).  Start twice daily (scheduled) over the counter Imodium.

## 2013-09-29 NOTE — Progress Notes (Signed)
Called to room to assist during endoscopic procedure.  Patient ID and intended procedure confirmed with present staff. Received instructions for my participation in the procedure from the performing physician.  

## 2013-09-29 NOTE — Progress Notes (Signed)
Report to PACU, RN, vss, BBS= Clear.  

## 2013-10-02 ENCOUNTER — Telehealth: Payer: Self-pay | Admitting: *Deleted

## 2013-10-02 NOTE — Telephone Encounter (Signed)
Number identifier, left message, follow-up  

## 2013-10-20 ENCOUNTER — Telehealth: Payer: Self-pay | Admitting: Internal Medicine

## 2013-10-20 NOTE — Telephone Encounter (Signed)
Sister called. Felt that flagyl helped diarrhea, but still ongoing. Wanted refill. Colon with negative bx and negative stool C diff PCR noted. Despite this, I refilled flagyl 250 mg qid x 10 days. Will forward to Dr Ardis Hughs. Patient will need follow up, likely.

## 2013-11-17 ENCOUNTER — Telehealth: Payer: Self-pay | Admitting: Internal Medicine

## 2013-11-17 MED ORDER — INSULIN GLARGINE 100 UNIT/ML ~~LOC~~ SOLN: SUBCUTANEOUS | Status: DC

## 2013-11-17 NOTE — Telephone Encounter (Signed)
[id number] - [place], [place] - [address] is requesting RX for Lantus 100/UNITS ML VIAL/ Pt states they are injecting 30 units BID.

## 2013-11-17 NOTE — Telephone Encounter (Signed)
rx sent in electronically 

## 2013-11-21 ENCOUNTER — Encounter: Payer: Self-pay | Admitting: Family Medicine

## 2013-11-21 ENCOUNTER — Ambulatory Visit (INDEPENDENT_AMBULATORY_CARE_PROVIDER_SITE_OTHER): Payer: Medicare Other | Admitting: Family Medicine

## 2013-11-21 VITALS — BP 118/72 | HR 68 | Temp 97.6°F | Wt 169.0 lb

## 2013-11-21 DIAGNOSIS — F17200 Nicotine dependence, unspecified, uncomplicated: Secondary | ICD-10-CM | POA: Insufficient documentation

## 2013-11-21 DIAGNOSIS — Z720 Tobacco use: Secondary | ICD-10-CM

## 2013-11-21 DIAGNOSIS — H6120 Impacted cerumen, unspecified ear: Secondary | ICD-10-CM

## 2013-11-21 DIAGNOSIS — E119 Type 2 diabetes mellitus without complications: Secondary | ICD-10-CM

## 2013-11-21 DIAGNOSIS — H6123 Impacted cerumen, bilateral: Secondary | ICD-10-CM

## 2013-11-21 MED ORDER — METFORMIN HCL 500 MG PO TABS: ORAL_TABLET | ORAL | Status: DC

## 2013-11-21 MED ORDER — INSULIN GLARGINE 100 UNIT/ML ~~LOC~~ SOLN: SUBCUTANEOUS | Status: DC

## 2013-11-21 NOTE — Assessment & Plan Note (Signed)
Advised of need for cessation. Patient contemplative. Not ready to quit today though. F/u each visit. High risk for repeat CVA if continues to smoke.

## 2013-11-21 NOTE — Progress Notes (Signed)
Garret Reddish, MD Phone: [phone]  Subjective:   William [name] is a 53 y.o. year old very pleasant male patient who presents with the following:  DIABETES Type II Medications taking and tolerating-.yes, lantus 30 units twice a day (listed on meds as once a day), previously with diarrhea and thought ? Related to metformin. Has skipped 3 doses lantus in last 2 weeks.  Blood Sugars per patient-fasting- 67 to 250 over last 2 weeks, before lunch 145-150 typically, hour after dinner 130-250 Regular Exercise-active, advised 10 minutes a day walking to start On Aspirin-yes On statin-yes Daily foot monitoring-yesyes  ROS- Denies Polyuria,Polydipsia, nocturia, Vision changes. Endorses Hypoglycemia symptoms (shaky, sweaty, hungry, weak anxious, tremor, palpitations, confusion, behavior change) when he got down to 67.   Tobacco abuse- not ready to quit. Smokes 1 ppd.  Difficulty hearing-feels like left ear is full. No tinnitus.   Hemoglobin a1c:  Lab Results  Component Value Date   HGBA1C 7.3* 09/13/2013   HGBA1C 7.7* 03/14/2013   HGBA1C 8.4* 06/29/2012   Past Medical History- Patient Active Problem List   Diagnosis Date Noted  . DIABETES MELLITUS, TYPE II 09/28/2006    Priority: High  . GERD (gastroesophageal reflux disease) 09/25/2013  . ONYCHOMYCOSIS, TOENAILS 03/22/2007  . HYPERLIPIDEMIA 09/28/2006  . HYPERTENSION 09/28/2006  . CEREBROVASCULAR ACCIDENT, HX OF 09/28/2006   Medications- reviewed and updated Current Outpatient Prescriptions  Medication Sig Dispense Refill  . amLODipine (NORVASC) 5 MG tablet TAKE 1 TABLET BY MOUTH DAILY  90 tablet  1  . aspirin 81 MG tablet Take 81 mg by mouth daily.        . B-D INS SYRINGE 0.5CC/31GX5/16 31G X 5/16" 0.5 ML MISC USE AS DIRECTED  100 each  5  . hydrochlorothiazide (HYDRODIURIL) 25 MG tablet TAKE 1 TABLET BY MOUTH DAILY  30 tablet  5  . insulin glargine (LANTUS) 100 UNIT/ML injection INJECT 40 UNITS INTO THE SKIN each morning  as long as sugar above 90.  10 mL  5  . labetalol (NORMODYNE) 200 MG tablet TAKE 1 TABLET BY MOUTH TWICE A DAY  180 tablet  1  . losartan (COZAAR) 100 MG tablet TAKE 1 TABLET EVERY DAY  90 tablet  1  . omeprazole (PRILOSEC) 20 MG capsule Take 20 mg by mouth daily.        . ONE TOUCH ULTRA TEST test strip USE AS DIRECTED 4 TIMES A DAY  100 each  10  . simvastatin (ZOCOR) 20 MG tablet TAKE 1 TABLET AT BEDTIME  90 tablet  1    Objective: BP 118/72  Pulse 68  Temp(Src) 97.6 F (36.4 C)  Wt 169 lb (76.658 kg) Gen: NAD, resting comfortably HEENT: bilateral cerumen impaction CV: RRR no murmurs rubs or gallops Lungs: CTAB no crackles, wheeze, rhonchi Abdomen: soft/nontender/nondistended/normal bowel sounds. No rebound or guarding.  Ext: no edema  Assessment/Plan:  DIABETES MELLITUS, TYPE II Labile as patient seems to be missing some doses due to hypoglycemia. He is taking 30 units BID and instructions say 30 daily. Decrease total daily insulin to 40 units in AM. Start back metformin (diarrhea seemed to be infectious in etiology and not related to metformin). Follow up in 2 weeks or sooner if hypoglycemic (reviewed precautions with patient). If does well, eventually may start gabapentin back as well for neuropathy.   Tobacco abuse Advised of need for cessation. Patient contemplative. Not ready to quit today though. F/u each visit. High risk for repeat CVA if continues to smoke.  cerumen impaction (bilateral but total occlusion on left)-irrigated with improvement of hearing and improved visualization of TM  Meds ordered this encounter  Medications  . insulin glargine (LANTUS) 100 UNIT/ML injection    Sig: INJECT 40 UNITS INTO THE SKIN each morning as long as sugar above 90.    Dispense:  10 mL    Refill:  5  . metFORMIN (GLUCOPHAGE) 500 MG tablet    Sig: TAKE 1 TABLET TWICE A DAY WITH MEALS    Dispense:  60 tablet    Refill:  5

## 2013-11-21 NOTE — Assessment & Plan Note (Addendum)
Labile as patient seems to be missing some doses due to hypoglycemia. He is taking 30 units BID and instructions say 30 daily. Decrease total daily insulin to 40 units in AM. Start back metformin (diarrhea seemed to be infectious in etiology and not related to metformin). Follow up in 2 weeks or sooner if hypoglycemic (reviewed precautions with patient). If does well, eventually may start gabapentin back as well for neuropathy.

## 2013-11-21 NOTE — Patient Instructions (Addendum)
Diabetes  Take Lantus 40 units each morning as long as blood sugar above 90 (instead of 30 units twice a day)  Start metformin 500mg  twice a day again  Goal blood sugar throughout the day between 80-180 (idealy less than 150 but do not want to run low)  See me back in 2 weeks or sooner if you develop low blood sugars less than 80, recurrent diarrhea  Chart your blood sugars for me please  Low Blood Sugar Low blood sugar (hypoglycemia) means that the level of sugar in your blood is lower than it should be. Signs of low blood sugar include:  Getting sweaty.  Feeling hungry.  Feeling dizzy or weak.  Feeling sleepier than normal.  Feeling nervous.  Headaches.  Having a fast heartbeat. Low blood sugar can happen fast and can be an emergency. Your doctor can do tests to check your blood sugar level. You can have low blood sugar and not have diabetes. HOME CARE  Check your blood sugar as told by your doctor. If it is less than 70 mg/dl or as told by your doctor, take 1 of the following:  3 to 4 glucose tablets.   cup clear juice.   cup soda pop, not diet.  1 cup milk.  5 to 6 hard candies.  Recheck blood sugar after 15 minutes. Repeat until it is at the right level.  Eat a snack if it is more than 1 hour until the next meal.  Only take medicine as told by your doctor.  Do not skip meals. Eat on time.  Do not drink alcohol except with meals.  Check your blood glucose before driving.  Check your blood glucose before and after exercise.  Always carry treatment with you, such as glucose pills.  Always wear a medical alert bracelet if you have diabetes. GET HELP RIGHT AWAY IF:   Your blood glucose goes below 70 mg/dl or as told by your doctor, and you:  Are confused.  Are not able to swallow.  Pass out (faint).  You cannot treat yourself. You may need someone to help you.  You have low blood sugar problems often.  You have problems from your  medicines.  You are not feeling better after 3 to 4 days.  You have vision changes. MAKE SURE YOU:   Understand these instructions.  Will watch this condition.  Will get help right away if you are not doing well or get worse. Document Released: 07/01/2009 Document Revised: 06/29/2011 Document Reviewed: 07/01/2009 [facility] Patient Information 2015 [place], [place]. This information is not intended to replace advice given to you by your health care provider. Make sure you discuss any questions you have with your health care provider.

## 2013-12-02 ENCOUNTER — Other Ambulatory Visit: Payer: Self-pay | Admitting: Internal Medicine

## 2013-12-04 ENCOUNTER — Other Ambulatory Visit: Payer: Self-pay | Admitting: Internal Medicine

## 2013-12-05 ENCOUNTER — Encounter: Payer: Self-pay | Admitting: Family Medicine

## 2013-12-05 ENCOUNTER — Ambulatory Visit (INDEPENDENT_AMBULATORY_CARE_PROVIDER_SITE_OTHER): Payer: Medicare Other | Admitting: Family Medicine

## 2013-12-05 VITALS — BP 130/72 | HR 88 | Temp 97.9°F | Wt 172.0 lb

## 2013-12-05 DIAGNOSIS — E119 Type 2 diabetes mellitus without complications: Secondary | ICD-10-CM | POA: Diagnosis not present

## 2013-12-05 MED ORDER — INSULIN GLARGINE 100 UNIT/ML ~~LOC~~ SOLN
SUBCUTANEOUS | Status: DC
Start: 2013-12-05 — End: 2013-12-11

## 2013-12-05 MED ORDER — METFORMIN HCL 1000 MG PO TABS: ORAL_TABLET | ORAL | Status: DC

## 2013-12-05 NOTE — Assessment & Plan Note (Addendum)
Only one hypoglycemic event on 40 units Lantus once daily in the morning. Increase metformin to 1 g twice daily. See patient in one month to check hemoglobin A1c. Gave strict return precautions regarding hypoglycemia. Continue to log sugar-patient currently testing 3 times daily.

## 2013-12-05 NOTE — Progress Notes (Signed)
  Garret Reddish, MD Phone: [phone]  Subjective:   William [name] is a 53 y.o. year old very pleasant male patient who presents with the following:  Diabetes Patient returns after changing Lantus to 30 units twice a day to 40 units once daily in  morning due to hypoglycemia. Sugars overall looked better. Fasting sugars ranging around 140 with range of 95-200. He has experienced a low including 63. Patient did not take insulin the morning that his blood sugar was 63 and the next morning it was 200 which accounts for the high.Before lunch his average is 150 ranging from 110/186. before bed time his range of 160-226 which is about 2 hours after bedtime.  Patient would like to take more insulin due to high sugars at night. He is tolerating his metformin.  ROS- no polyuria or polydipsia. He has a loose bowel movement 1-2 times a day states this is not bothersome. No fever chills.  Continues to smoke not interested in cessation at the time.  Past Medical History- tobacco abuse. Diabetes. History of CVA. Hyperlipidemia. Hypertension. GERD.   Medications- reviewed and updated Current Outpatient Prescriptions  Medication Sig Dispense Refill  . amLODipine (NORVASC) 5 MG tablet TAKE 1 TABLET BY MOUTH DAILY  90 tablet  1  . aspirin 81 MG tablet Take 81 mg by mouth daily.        . B-D INS SYRINGE 0.5CC/31GX5/16 31G X 5/16" 0.5 ML MISC USE AS DIRECTED  100 each  5  . glucose blood (ONE TOUCH ULTRA TEST) test strip Dx: 250.00  100 each  2  . hydrochlorothiazide (HYDRODIURIL) 25 MG tablet TAKE 1 TABLET BY MOUTH DAILY  30 tablet  5  . insulin glargine (LANTUS) 100 UNIT/ML injection INJECT 40 UNITS INTO THE SKIN each morning as long as sugar above 90.  30 mL  5  . labetalol (NORMODYNE) 200 MG tablet TAKE 1 TABLET BY MOUTH TWICE A DAY  180 tablet  1  . losartan (COZAAR) 100 MG tablet TAKE 1 TABLET EVERY DAY  90 tablet  1  . metFORMIN (GLUCOPHAGE) 1000 MG tablet TAKE 1 TABLET TWICE A DAY WITH MEALS  60  tablet  5  . omeprazole (PRILOSEC) 20 MG capsule Take 20 mg by mouth daily.        . simvastatin (ZOCOR) 20 MG tablet TAKE 1 TABLET AT BEDTIME  90 tablet  1   No current facility-administered medications for this visit.    Objective: BP 130/72  Pulse 88  Temp(Src) 97.9 F (36.6 C)  Wt 172 lb (78.019 kg) Gen: NAD, resting comfortably  Assessment/Plan:  DIABETES MELLITUS, TYPE II Only one hypoglycemic event on 40 units Lantus once daily in the morning. Increase metformin to 1 g twice daily. See patient in one month to check hemoglobin A1c. Gave strict return precautions regarding hypoglycemia. Continue to log sugar-patient currently testing 3 times daily.   Meds ordered this encounter  Medications  . insulin glargine (LANTUS) 100 UNIT/ML injection    Sig: INJECT 40 UNITS INTO THE SKIN each morning as long as sugar above 90.    Dispense:  30 mL    Refill:  5  . metFORMIN (GLUCOPHAGE) 1000 MG tablet    Sig: TAKE 1 TABLET TWICE A DAY WITH MEALS    Dispense:  60 tablet    Refill:  5

## 2013-12-05 NOTE — Patient Instructions (Signed)
Diabetes  Take Lantus 40 units each morning as long as blood sugar above 90 (no changes)  Start metformin 1000mg  twice a day (take 2 pills of 500 mg twice a day until you run out, then take the 1000mg  pills I sent in)  Goal blood sugar throughout the day between 80-180 (idealy less than 150 but do not want to run low)  See me back in 4 weeks or sooner if you develop low blood sugars less than 90 , recurrent diarrhea  Chart your blood sugars for me please. Great job doing this!

## 2013-12-11 ENCOUNTER — Other Ambulatory Visit: Payer: Self-pay | Admitting: *Deleted

## 2013-12-11 MED ORDER — INSULIN GLARGINE 100 UNIT/ML ~~LOC~~ SOLN: SUBCUTANEOUS | Status: DC

## 2013-12-11 NOTE — Addendum Note (Signed)
Addended by: Townsend Roger D on: 12/11/2013 01:37 PM   Modules accepted: Medications

## 2013-12-19 ENCOUNTER — Ambulatory Visit (INDEPENDENT_AMBULATORY_CARE_PROVIDER_SITE_OTHER): Payer: Medicare Other | Admitting: Gastroenterology

## 2013-12-19 ENCOUNTER — Encounter: Payer: Self-pay | Admitting: Gastroenterology

## 2013-12-19 VITALS — BP 140/80 | HR 67 | Ht 67.75 in | Wt 170.0 lb

## 2013-12-19 DIAGNOSIS — R197 Diarrhea, unspecified: Secondary | ICD-10-CM | POA: Diagnosis not present

## 2013-12-19 NOTE — Progress Notes (Signed)
Review of pertinent gastrointestinal problems: 1. Several months of diarrhea: following Abx; C. Diff PCR neagtive, sed rate 100.Colonoscopy 09/2013 Ardis Hughs found ?atypical, mild colitis (see report), random biopsies normal.  Could not intubate TI.  Empiric flagyl course twice.  Timing of gluicophage restarting and increased dose. 2. screening colonoscopy 2012 Kristoffer Bala. 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    HPI: A row This is a  very pleasant 53 year old man whom I last saw the time of a colonoscopy, see those results summarized above  Took 2 courses of flagyl, 1 month of probiotic.    Over time the diarrhea has improved.  Still has loose stools at times.  No overt bleeding.    Takes imodium about twice per week.  These reliably help.  Weight is unchanged over 3 months (same scale here in GI)   Past Medical History  Diagnosis Date  . DIABETES MELLITUS, TYPE II 09/28/2006  . HYPERLIPIDEMIA 09/28/2006  . HYPERTENSION 09/28/2006  . CEREBROVASCULAR ACCIDENT, HX OF 09/28/2006    2007 affected left side and peripheral vision  . GERD (gastroesophageal reflux disease)   . Stroke     Past Surgical History  Procedure Laterality Date  . Rhinoplasty    . Lymphadenectomy    . Sigmoidoscopy      Current Outpatient Prescriptions  Medication Sig Dispense Refill  . amLODipine (NORVASC) 5 MG tablet TAKE 1 TABLET BY MOUTH DAILY  90 tablet  1  . aspirin 81 MG tablet Take 81 mg by mouth daily.        . B-D INS SYRINGE 0.5CC/31GX5/16 31G X 5/16" 0.5 ML MISC USE AS DIRECTED  100 each  5  . glucose blood (ONE TOUCH ULTRA TEST) test strip Dx: 250.00  100 each  2  . hydrochlorothiazide (HYDRODIURIL) 25 MG tablet TAKE 1 TABLET BY MOUTH DAILY  30 tablet  5  . insulin glargine (LANTUS) 100 UNIT/ML injection INJECT 40 UNITS INTO THE SKIN each morning as long as sugar above 90.  30 mL  5  .  labetalol (NORMODYNE) 200 MG tablet TAKE 1 TABLET BY MOUTH TWICE A DAY  180 tablet  1  . losartan (COZAAR) 100 MG tablet TAKE 1 TABLET EVERY DAY  90 tablet  1  . metFORMIN (GLUCOPHAGE) 1000 MG tablet TAKE 1 TABLET TWICE A DAY WITH MEALS  60 tablet  5  . omeprazole (PRILOSEC) 20 MG capsule Take 20 mg by mouth daily.        . simvastatin (ZOCOR) 20 MG tablet TAKE 1 TABLET AT BEDTIME  90 tablet  1   No current facility-administered medications for this visit.    Allergies as of 12/19/2013 - Review Complete 12/19/2013  Allergen Reaction Noted  . Sulfamethoxazole  10/14/2006    Family History  Problem Relation Age of Onset  . Lung cancer Mother   . Diabetes Father   . Stroke Father   . Alcohol abuse Brother   . Diabetes Brother   . Heart attack Sister   . Colon cancer Neg Hx     History   Social History  . Marital Status: Divorced    Spouse Name: N/A    Number of Children: N/A  . Years of Education: N/A   Occupational History  . Not on file.   Social History Main Topics  . Smoking status: Current Every Day Smoker --  1.00 packs/day    Types: Cigarettes  . Smokeless tobacco: Current User  . Alcohol Use: No  . Drug Use: No  . Sexual Activity: Not on file   Other Topics Concern  . Not on file   Social History Narrative  . No narrative on file      Physical Exam: BP 140/80  Pulse 67  Ht 5' 7.75" (1.721 m)  Wt 170 lb (77.111 kg)  BMI 26.03 kg/m2 Constitutional: generally well-appearing Psychiatric: alert and oriented x3 Abdomen: soft, nontender, nondistended, no obvious ascites, no peritoneal signs, normal bowel sounds     Assessment and plan: 53 y.o. male with intermittent loose stools, perhaps related to increased metformin dose  He will continue Imodium on an as-needed basis. I explained to him that I prefer this for intermittent diarrhea over a probiotic. It would certainly be less expensive. He will call if any further questions or concerns.

## 2013-12-19 NOTE — Patient Instructions (Addendum)
One of your biggest health concerns is your smoking.  This increases your risk for most cancers and serious cardiovascular diseases such as strokes, heart attacks.  You should try your best to stop.  If you need assistance, please contact your PCP or Smoking Cessation Class at [facility] [phone]) or [place] (1-800-QUIT-NOW). Take imodium as needed (preferred over any probioitics). Call here if you diarrhea worsens. The glucophage may be contributing to your intermittent diarrhea.

## 2013-12-23 ENCOUNTER — Other Ambulatory Visit: Payer: Self-pay | Admitting: Internal Medicine

## 2014-01-02 ENCOUNTER — Telehealth: Payer: Self-pay | Admitting: Family Medicine

## 2014-01-02 ENCOUNTER — Encounter: Payer: Self-pay | Admitting: Family Medicine

## 2014-01-02 ENCOUNTER — Ambulatory Visit (INDEPENDENT_AMBULATORY_CARE_PROVIDER_SITE_OTHER): Payer: Medicare Other | Admitting: Family Medicine

## 2014-01-02 VITALS — BP 118/72 | HR 60 | Temp 98.0°F | Wt 171.0 lb

## 2014-01-02 DIAGNOSIS — Z23 Encounter for immunization: Secondary | ICD-10-CM

## 2014-01-02 DIAGNOSIS — E119 Type 2 diabetes mellitus without complications: Secondary | ICD-10-CM

## 2014-01-02 LAB — HEMOGLOBIN A1C: HEMOGLOBIN A1C: 7.6 % — AB (ref 4.6–6.5)

## 2014-01-02 NOTE — Patient Instructions (Signed)
Diabetes  Decrease Lantus to 38 units  If your blood sugar is <75 in the morning more than 1x a month, I want to know immediately.   Continue metformin  See me back in 3 months (unless a1c >8 then 1 month)  Keep working on eating right thing/exercising  Health Maintenance Due  Topic Date Due  . Influenza Vaccine -today 11/18/2013

## 2014-01-02 NOTE — Progress Notes (Signed)
  Garret Reddish, MD Phone: [phone]  Subjective:   William [name] is a 53 y.o. year old very pleasant male patient who presents with the following:  Diabetes-stable, suspect mild poor control At last visit, patient was continued on Lantus 40 units once in the morning and 1 g metformin BID.  Fasting typically 90-150. 2 readings above 150 in 16 days and 3 readings below 70 (52 low). Before lunch CBG 135-200 usually around 170. Before dinner 80-235 usually around 170.   ROS- no polyuria or polydipsia. No fever chills. Tolerating metformin.   Continues to smoke 1 ppd (knows he needs to quit) not interested in cessation at the time.  Past Medical History- tobacco abuse. Diabetes. History of CVA. Hyperlipidemia. Hypertension. GERD.   Medications- reviewed and updated Current Outpatient Prescriptions  Medication Sig Dispense Refill  . amLODipine (NORVASC) 5 MG tablet TAKE 1 TABLET BY MOUTH DAILY  90 tablet  1  . aspirin 81 MG tablet Take 81 mg by mouth daily.        . B-D INS SYRINGE 0.5CC/31GX5/16 31G X 5/16" 0.5 ML MISC USE AS DIRECTED  100 each  5  . glucose blood (ONE TOUCH ULTRA TEST) test strip Dx: 250.00  100 each  2  . hydrochlorothiazide (HYDRODIURIL) 25 MG tablet TAKE 1 TABLET BY MOUTH DAILY  30 tablet  5  . insulin glargine (LANTUS) 100 UNIT/ML injection INJECT 38 UNITS INTO THE SKIN each morning as long as sugar above 90.      . labetalol (NORMODYNE) 200 MG tablet TAKE 1 TABLET BY MOUTH TWICE A DAY  180 tablet  1  . labetalol (NORMODYNE) 200 MG tablet TAKE 1 TABLET BY MOUTH TWICE A DAY  180 tablet  1  . losartan (COZAAR) 100 MG tablet TAKE 1 TABLET EVERY DAY  90 tablet  1  . metFORMIN (GLUCOPHAGE) 1000 MG tablet TAKE 1 TABLET TWICE A DAY WITH MEALS  60 tablet  5  . omeprazole (PRILOSEC) 20 MG capsule Take 20 mg by mouth daily.        . simvastatin (ZOCOR) 20 MG tablet TAKE 1 TABLET AT BEDTIME  90 tablet  1   No current facility-administered medications for this visit.     Objective: BP 118/72  Pulse 60  Temp(Src) 98 F (36.7 C)  Wt 171 lb (77.565 kg) Gen: NAD, resting comfortably CV: RRR no mrg Lungs: CTAB Ext: no edema Skin: warm, dry, no rash  Assessment/Plan:  DIABETES MELLITUS, TYPE II Check a1c today. Suspect mild poor control but hopeful for a1c below 8. I think less than 7.5 or 8 is reasonable given patient with some issues with compliance and 3 CBGs <70 which he did not report to me as instructed. Decrease Lantus to 38 units and continue metformin 2g daily total. Could consider low dose sulfonylurea for mealtime assist (if cost is an issue) or other agent less likely to produce hypoglycemia but cost may be concern with glp-1 receptor agonist or sglt2 inhibitor.   3 mo f/u if good control <8, or sooner if above  Meds ordered this encounter  Medications  . insulin glargine (LANTUS) 100 UNIT/ML injection    Sig: INJECT 38 UNITS INTO THE SKIN each morning as long as sugar above 90.

## 2014-01-02 NOTE — Telephone Encounter (Signed)
Pt returning your call about his labs.  Asked if you will call first thing in the am

## 2014-01-02 NOTE — Assessment & Plan Note (Addendum)
Check a1c today. Suspect mild poor control but hopeful for a1c below 8. I think less than 7.5 or 8 is reasonable given patient with some issues with compliance and 3 CBGs <70 which he did not report to me as instructed. Decrease Lantus to 38 units and continue metformin 2g daily total. Could consider low dose sulfonylurea for mealtime assist (if cost is an issue) or other agent less likely to produce hypoglycemia but cost may be concern with glp-1 receptor agonist or sglt2 inhibitor.

## 2014-01-03 NOTE — Telephone Encounter (Signed)
Pt notified of lab results

## 2014-02-15 ENCOUNTER — Telehealth: Payer: Self-pay

## 2014-02-15 NOTE — Telephone Encounter (Signed)
Pt has been scheduled for 02/19/14 at 11:15

## 2014-02-15 NOTE — Telephone Encounter (Signed)
Pt brought in a disability paperwork form that needs to be filled out but Dr. Yong Channel has not seen pt regarding whatever disability there is. Please call pt and schedule them to discuss form with Dr. Yong Channel and disability per Dr. Yong Channel.

## 2014-02-19 ENCOUNTER — Ambulatory Visit (INDEPENDENT_AMBULATORY_CARE_PROVIDER_SITE_OTHER): Payer: Medicare Other | Admitting: Family Medicine

## 2014-02-19 ENCOUNTER — Encounter: Payer: Self-pay | Admitting: Family Medicine

## 2014-02-19 VITALS — BP 130/82 | HR 72 | Temp 98.0°F | Wt 173.0 lb

## 2014-02-19 DIAGNOSIS — E1142 Type 2 diabetes mellitus with diabetic polyneuropathy: Secondary | ICD-10-CM

## 2014-02-19 DIAGNOSIS — Z8679 Personal history of other diseases of the circulatory system: Secondary | ICD-10-CM

## 2014-02-19 NOTE — Progress Notes (Signed)
Garret Reddish, MD Phone: [phone]  Subjective:   William [name] is a 53 y.o. year old very pleasant male patient who presents with the following:  DIABETES Type II-mild poor control  Lab Results  Component Value Date   HGBA1C 7.6* 01/02/2014   HGBA1C 7.3* 09/13/2013   HGBA1C 7.7* 03/14/2013   Patient is disabled and request form completionin addition to diabetes follow-up. He has a baseline low IQ and per his sister is basically a first grade level. After the stroke he became unable to work as he intermittently gets confused. 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  Medications taking and tolerating-yes, metformin and Lantus38 units Blood Sugars per patient-fasting-usually less than 150. He also checks 2 other times a day and has varying readings from 150 up to 300. Regular Exercise-no we encouraged this per after visit summary  On Aspirin-yes On statin-yes Daily foot monitoring-yes  ROS- Denies Polyuria,Polydipsia, nocturia, Vision changes though he has chronic peripheral vision issues after stroke.endorsed one time hypoglycemia in the morning and 65 after which she drink a Coke and number improved to 103 otherwise Denies  Hypoglycemia symptoms (shaky, sweaty, hungry, weak anxious, tremor, palpitations, confusion, behavior change).   Past Medical History- Patient Active Problem List   Diagnosis Date Noted  . Tobacco abuse 11/21/2013    Priority: High  . Diabetes mellitus, type 2 09/28/2006    Priority: High  . History of cardiovascular disorder 09/28/2006    Priority: High  . HYPERLIPIDEMIA 09/28/2006    Priority: Medium  . HYPERTENSION 09/28/2006    Priority: Medium  . GERD (gastroesophageal reflux disease) 09/25/2013  . ONYCHOMYCOSIS, TOENAILS 03/22/2007   Medications- reviewed and updated Current Outpatient Prescriptions  Medication Sig Dispense Refill  . amLODipine (NORVASC) 5 MG tablet  TAKE 1 TABLET BY MOUTH DAILY 90 tablet 1  . aspirin 81 MG tablet Take 81 mg by mouth daily.      . B-D INS SYRINGE 0.5CC/31GX5/16 31G X 5/16" 0.5 ML MISC USE AS DIRECTED 100 each 5  . glucose blood (ONE TOUCH ULTRA TEST) test strip Dx: 250.00 100 each 2  . hydrochlorothiazide (HYDRODIURIL) 25 MG tablet TAKE 1 TABLET BY MOUTH DAILY 30 tablet 5  . insulin glargine (LANTUS) 100 UNIT/ML injection INJECT 38 UNITS INTO THE SKIN each morning as long as sugar above 90.    . labetalol (NORMODYNE) 200 MG tablet TAKE 1 TABLET BY MOUTH TWICE A DAY 180 tablet 1  . labetalol (NORMODYNE) 200 MG tablet TAKE 1 TABLET BY MOUTH TWICE A DAY 180 tablet 1  . losartan (COZAAR) 100 MG tablet TAKE 1 TABLET EVERY DAY 90 tablet 1  . metFORMIN (GLUCOPHAGE) 1000 MG tablet TAKE 1 TABLET TWICE A DAY WITH MEALS 60 tablet 5  . omeprazole (PRILOSEC) 20 MG capsule Take 20 mg by mouth daily.      . simvastatin (ZOCOR) 20 MG tablet TAKE 1 TABLET AT BEDTIME 90 tablet 1   No current facility-administered medications for this visit.    Objective: BP 130/82 mmHg  Pulse 72  Temp(Src) 98 F (36.7 C)  Wt 173 lb (78.472 kg) Gen: NAD, resting comfortably CV: RRR no murmurs rubs or gallops Lungs: CTAB no crackles, wheeze, rhonchi Abdomen: soft/nontender/nondistended/normal bowel sounds.  Ext: no edema Skin: warm, dry, no rash Neuro: grossly normal, moves all extremities, slow answers at times, peripheral vision is probably limited to 20 or 30 degrees.  Assessment/Plan:  Diabetes mellitus, type 2 Given patient's low IQ and believe the goal of 8 is reasonable for his diabetes. Last A1c was at the school. Patient did have one hypoglycemic episodes since I saw him last. He showed appropriate response. I reinforced management of hypoglycemia. Also filled out a form for disability form. Previous forms can be found under scanned documents under [place]   Patient also mentioned to me that he did take some extra Lantus one  day when his blood sugar got above 300. I counseled him against this method and instead suggested that he call us. We gave him a log book and asked him to use this to check 3 times a day and bring this to his next visit  >50% of 20 minute office visit was spent on counseling ( hypoglycemia management and the importance of letting us manage highs and lows at this office) and coordination of care (arranging disability)

## 2014-02-19 NOTE — Patient Instructions (Addendum)
Will fax disability today  Diabetes  Continue Lantus to 38 units  Continue metformin  If your blood sugar is <75 in the morning more than 1x a month, I want to know immediately. Please bring a copy of your sugars next visit. (fill out in booklet)  See me back in 2 months   Exercise at least 5 minutes a day by walking (go inside to rest afterwards). Hopefully next time we can increase to 7-8 minutes.   Health Maintenance Due  Topic Date Due  . OPHTHALMOLOGY EXAM -please schedule your eye doctor visit even if it is after our next appointment 02/18/2014

## 2014-02-19 NOTE — Assessment & Plan Note (Signed)
Given patient's low IQ and believe the goal of 8 is reasonable for his diabetes. Last A1c was at the school. Patient did have one hypoglycemic episodes since I saw him last. He showed appropriate response. I reinforced management of hypoglycemia. Also filled out a form for disability form. Previous forms can be found under scanned documents under [place] [employer] [place]

## 2014-02-23 ENCOUNTER — Other Ambulatory Visit: Payer: Self-pay | Admitting: Internal Medicine

## 2014-03-22 ENCOUNTER — Other Ambulatory Visit: Payer: Self-pay | Admitting: Internal Medicine

## 2014-03-27 ENCOUNTER — Other Ambulatory Visit: Payer: Self-pay | Admitting: Internal Medicine

## 2014-03-27 DIAGNOSIS — E1142 Type 2 diabetes mellitus with diabetic polyneuropathy: Secondary | ICD-10-CM

## 2014-03-30 ENCOUNTER — Other Ambulatory Visit: Payer: Self-pay | Admitting: Family Medicine

## 2014-03-30 ENCOUNTER — Other Ambulatory Visit: Payer: Self-pay

## 2014-03-30 MED ORDER — SIMVASTATIN 20 MG PO TABS: ORAL_TABLET | ORAL | Status: DC

## 2014-03-30 NOTE — Telephone Encounter (Signed)
Rx request for Simvastatin 20 mg tablet- take 1 tablet at bedtime.  #90  Pharm:  [facility]   Rx sent to pharmacy for 6 months.

## 2014-04-03 ENCOUNTER — Ambulatory Visit: Payer: Medicare Other | Admitting: Family Medicine

## 2014-04-30 ENCOUNTER — Ambulatory Visit (INDEPENDENT_AMBULATORY_CARE_PROVIDER_SITE_OTHER): Payer: Medicare Other | Admitting: Family Medicine

## 2014-04-30 ENCOUNTER — Encounter: Payer: Self-pay | Admitting: Family Medicine

## 2014-04-30 VITALS — BP 144/78 | HR 80 | Temp 98.3°F | Ht 67.75 in | Wt 178.0 lb

## 2014-04-30 DIAGNOSIS — E1142 Type 2 diabetes mellitus with diabetic polyneuropathy: Secondary | ICD-10-CM | POA: Diagnosis not present

## 2014-04-30 DIAGNOSIS — N183 Chronic kidney disease, stage 3 unspecified: Secondary | ICD-10-CM | POA: Insufficient documentation

## 2014-04-30 DIAGNOSIS — E785 Hyperlipidemia, unspecified: Secondary | ICD-10-CM | POA: Diagnosis not present

## 2014-04-30 DIAGNOSIS — I10 Essential (primary) hypertension: Secondary | ICD-10-CM | POA: Diagnosis not present

## 2014-04-30 LAB — COMPREHENSIVE METABOLIC PANEL
ALK PHOS: 109 U/L (ref 39–117)
ALT: 23 U/L (ref 0–53)
AST: 18 U/L (ref 0–37)
Albumin: 3.5 g/dL (ref 3.5–5.2)
BUN: 27 mg/dL — AB (ref 6–23)
CO2: 28 mEq/L (ref 19–32)
Calcium: 9.3 mg/dL (ref 8.4–10.5)
Chloride: 101 mEq/L (ref 96–112)
Creatinine, Ser: 1.3 mg/dL (ref 0.4–1.5)
GFR: 60.25 mL/min (ref >60.00)
Glucose, Bld: 55 mg/dL — ABNORMAL LOW (ref 70–99)
Potassium: 4.3 mEq/L (ref 3.5–5.1)
Sodium: 140 mEq/L (ref 135–145)
Total Bilirubin: 0.2 mg/dL (ref 0.2–1.2)
Total Protein: 7.9 g/dL (ref 6.0–8.3)

## 2014-04-30 LAB — LDL CHOLESTEROL, DIRECT: LDL DIRECT: 101 mg/dL

## 2014-04-30 LAB — HEMOGLOBIN A1C: HEMOGLOBIN A1C: 8.6 % — AB (ref 4.6–6.5)

## 2014-04-30 LAB — TSH: TSH: 3.88 u[IU]/mL (ref 0.35–4.50)

## 2014-04-30 NOTE — Assessment & Plan Note (Signed)
As always advised cessation. Patient not interested.

## 2014-04-30 NOTE — Patient Instructions (Addendum)
Diabetes-Continue Lantus to 38 units (no nighttime doses whatsoever),  Continue metformin. Keep filling out sugar log. Anything below 75 I want to know about.   See me back in 3 months   Once again- encourage you to Exercise at least 5 minutes a day by walking (go inside to rest afterwards). Hopefully next time we can increase to 7-8 minutes.   Encourage you to quit smoking >already hearing a little wheeze which is a sign of lung damage  Take your blood pressure medicine as soon as you can  Health Maintenance Due  Topic Date Due  . OPHTHALMOLOGY EXAM -please schedule your eye doctor visit even if it is after our next appointment 02/18/2014

## 2014-04-30 NOTE — Progress Notes (Signed)
Garret Reddish, MD Phone: [phone]  Subjective:   William [name] is a 54 y.o. year old very pleasant male patient who presents with the following:  Diabetes Lab Results  Component Value Date   HGBA1C 8.6* 04/30/2014  Medications taking and tolerating-yes, metformin 1g BID and Lantus38 units in AM Blood Sugars per patient-fasting-72-141 (a readings of 62 and 60 in this time) After lunch 155-200, after dinner usually right at 200.  Regular Exercise-no we encouraged this per after visit summary  On Aspirin-yes On statin-yes Daily foot monitoring-yes ROS- hypoglycemia noted as above, no blurry vision  Hypertension-poor control as did not take blood pressure medicine yet this morning  BP Readings from Last 3 Encounters:  04/30/14 144/78  02/19/14 130/82  01/02/14 118/72  Home BP monitoring-no Compliant with medications-did not take medication yet this AM as has not eaten anything yet. Otherwise yes without side effects ROS-Denies any CP, HA, SOB, blurry vision, LE edema  Hyperlipidemia-previously controlled  Lab Results  Component Value Date   LDLCALC 80 09/13/2013  On statin: simvastatin 20mg  daily Regular exercise: none ROS- no chest pain or shortness of breath. No myalgias  Past Medical History- Patient Active Problem List   Diagnosis Date Noted  . Tobacco abuse 11/21/2013    Priority: High  . Diabetes mellitus, type 2 09/28/2006    Priority: High  . History of cardiovascular disorder 09/28/2006    Priority: High  . CKD (chronic kidney disease), stage II 04/30/2014    Priority: Medium  . Hyperlipidemia 09/28/2006    Priority: Medium  . Essential hypertension 09/28/2006    Priority: Medium  . GERD (gastroesophageal reflux disease) 09/25/2013  . ONYCHOMYCOSIS, TOENAILS 03/22/2007   Medications- reviewed and updated Current Outpatient Prescriptions  Medication Sig Dispense Refill  . amLODipine (NORVASC) 5 MG tablet TAKE 1 TABLET BY MOUTH DAILY 90 tablet  0  . aspirin 81 MG tablet Take 81 mg by mouth daily.      . B-D INS SYRINGE 0.5CC/31GX5/16 31G X 5/16" 0.5 ML MISC USE AS DIRECTED 100 each 5  . hydrochlorothiazide (HYDRODIURIL) 25 MG tablet TAKE 1 TABLET BY MOUTH DAILY 30 tablet 3  . insulin glargine (LANTUS) 100 UNIT/ML injection INJECT 38 UNITS INTO THE SKIN each morning as long as sugar above 90.    . labetalol (NORMODYNE) 200 MG tablet TAKE 1 TABLET BY MOUTH TWICE A DAY 180 tablet 1  . losartan (COZAAR) 100 MG tablet TAKE 1 TABLET EVERY DAY 90 tablet 1  . metFORMIN (GLUCOPHAGE) 1000 MG tablet TAKE 1 TABLET TWICE A DAY WITH MEALS 60 tablet 5  . omeprazole (PRILOSEC) 20 MG capsule Take 20 mg by mouth daily.      . ONE TOUCH ULTRA TEST test strip CHECK BLOOD SUGAR 3 TIMES DAILY 100 each 3  . simvastatin (ZOCOR) 20 MG tablet TAKE 1 TABLET AT BEDTIME 90 tablet 1   No current facility-administered medications for this visit.    Objective: BP 144/78 mmHg  Pulse 80  Temp(Src) 98.3 F (36.8 C) (Oral)  Ht 5' 7.75" (1.721 m)  Wt 178 lb (80.74 kg)  BMI 27.26 kg/m2 Gen: NAD, resting comfortably CV: RRR no murmurs rubs or gallops Lungs: CTAB no crackles, wheeze, rhonchi Abdomen: soft/nontender/nondistended/normal bowel sounds.  Ext: no edema Skin: warm, dry, no rash   Assessment/Plan:  Diabetes mellitus, type 2 Lab Results  Component Value Date   HGBA1C 8.6* 04/30/2014  a1c up today to 8.6 from 7.6, patient also with hypoglycemia. He is very  inactive. We encouraged exercise. I advised him to avoid extra doses of lantus at nighttime which were causing hypoglycemia and subsequently he would eat more in the AM and raise later CBGs. We will continue 38 units lantus in the AM and metformin 1g BID. See each other in 3 months and if a1c remains above 8, would consider low dose amaryl. Patient seems to understand hypoglycemia management but not well what puts him at risk for this so I think a1c goal of 8 is reasonable with his cognitive  impairments.   Essential hypertension Poor control today. Did not take medication yet this morning- I encouraged him to do so even when fasting (except for Dm medication)  Tobacco abuse As always advised cessation. Patient not interested.   Hyperlipidemia Previously controlled on simvastatin 20mg  daily. Advised regular exercise as LDL just a hair above 100 and suspect repeat will be controlled. 3-6 month repeat given history of CVA.   Return precautions advised. 3 month follow up planned.   From labs today (fasting) patient instructions-Your cholesterol was up slightly-make sure to take your medicine daily. Exercise will also help. Your blood sugar was low-I am glad you had a coke on hand. Your a1c was up some. Try to start that exercise we talked about. Do not take extra nighttime insulin and then we may be able to go up on your morning dose next visit. Thyroid normal.

## 2014-04-30 NOTE — Assessment & Plan Note (Signed)
Previously controlled on simvastatin 20mg  daily. Advised regular exercise as LDL just a hair above 100 and suspect repeat will be controlled. 3-6 month repeat given history of CVA.

## 2014-04-30 NOTE — Assessment & Plan Note (Signed)
Lab Results  Component Value Date   HGBA1C 8.6* 04/30/2014  a1c up today to 8.6 from 7.6, patient also with hypoglycemia. He is very inactive. We encouraged exercise. I advised him to avoid extra doses of lantus at nighttime which were causing hypoglycemia and subsequently he would eat more in the AM and raise later CBGs. We will continue 38 units lantus in the AM and metformin 1g BID. See each other in 3 months and if a1c remains above 8, would consider low dose amaryl. Patient seems to understand hypoglycemia management but not well what puts him at risk for this so I think a1c goal of 8 is reasonable with his cognitive impairments.

## 2014-04-30 NOTE — Progress Notes (Signed)
Pre visit review using our clinic review tool, if applicable. No additional management support is needed unless otherwise documented below in the visit note. 

## 2014-04-30 NOTE — Assessment & Plan Note (Signed)
Poor control today. Did not take medication yet this morning- I encouraged him to do so even when fasting (except for Dm medication)

## 2014-06-23 ENCOUNTER — Other Ambulatory Visit: Payer: Self-pay | Admitting: Family Medicine

## 2014-07-31 ENCOUNTER — Ambulatory Visit (INDEPENDENT_AMBULATORY_CARE_PROVIDER_SITE_OTHER): Payer: Medicare Other | Admitting: Family Medicine

## 2014-07-31 ENCOUNTER — Ambulatory Visit: Payer: Medicare Other | Admitting: Family Medicine

## 2014-07-31 ENCOUNTER — Encounter: Payer: Self-pay | Admitting: Family Medicine

## 2014-07-31 VITALS — BP 120/82 | HR 77 | Temp 98.1°F | Wt 178.0 lb

## 2014-07-31 DIAGNOSIS — E785 Hyperlipidemia, unspecified: Secondary | ICD-10-CM

## 2014-07-31 DIAGNOSIS — E119 Type 2 diabetes mellitus without complications: Secondary | ICD-10-CM | POA: Diagnosis not present

## 2014-07-31 DIAGNOSIS — I10 Essential (primary) hypertension: Secondary | ICD-10-CM

## 2014-07-31 DIAGNOSIS — Z20828 Contact with and (suspected) exposure to other viral communicable diseases: Secondary | ICD-10-CM | POA: Diagnosis not present

## 2014-07-31 DIAGNOSIS — Z720 Tobacco use: Secondary | ICD-10-CM | POA: Diagnosis not present

## 2014-07-31 LAB — HEMOGLOBIN A1C: HEMOGLOBIN A1C: 7.5 % — AB (ref 4.6–6.5)

## 2014-07-31 LAB — LDL CHOLESTEROL, DIRECT: Direct LDL: 64 mg/dL

## 2014-07-31 NOTE — Patient Instructions (Addendum)
Get eye exam scheduled and have results faxed to [phone]. VERY IMPORTANT!!!!!  Blood pressure looks great  Diabetes sounds to be in a better place. Let's check your a1c. If you are below 8, make no changes, if you are between 8-9 I may add a low dose medicine. The new medicine could drop your sugar so you would have to watch your symptoms closely.   Hopeful cholesterol is at least below 100, if not, I may chang eyour medicine as well.   See me in 3 months

## 2014-07-31 NOTE — Progress Notes (Signed)
Garret Reddish, MD Phone: [phone]  Subjective:   William [name] is a 54 y.o. year old very pleasant male patient who presents with the following:  Diabetes Mellitus II-improving control  Lab Results  Component Value Date   HGBA1C 8.6* 04/30/2014  Lantus 38 units in AM and metformin 1g BID. a1c goal 8 due to cognitive issues This morning CBG was 88, usually mornings around 120. 167 last night after dinner then 276 before bedtime ate a hefty meal including carbs.  ROS- no hypoglycemia, blurry visions.   Hypertension-controlled  BP Readings from Last 3 Encounters:  07/31/14 120/82  04/30/14 144/78  02/19/14 130/82   Home BP monitoring-no Compliant with medications-yes without side effects ROS-Denies any CP, HA, SOB, blurry vision, LE edema.   Hyperlipidemia-mild poor control  On statin: yes as below Regular exercise: walking 3-4 x a week ROS- no chest pain or shortness of breath. No myalgias  Past Medical History- Patient Active Problem List   Diagnosis Date Noted  . Tobacco abuse 11/21/2013    Priority: High  . Diabetes mellitus, type 2 09/28/2006    Priority: High  . History of cardiovascular disorder 09/28/2006    Priority: High  . CKD (chronic kidney disease), stage II 04/30/2014    Priority: Medium  . Hyperlipidemia 09/28/2006    Priority: Medium  . Essential hypertension 09/28/2006    Priority: Medium  . GERD (gastroesophageal reflux disease) 09/25/2013  . ONYCHOMYCOSIS, TOENAILS 03/22/2007   Medications- reviewed and updated Current Outpatient Prescriptions  Medication Sig Dispense Refill  . amLODipine (NORVASC) 5 MG tablet TAKE 1 TABLET BY MOUTH DAILY 90 tablet 0  . aspirin 81 MG tablet Take 81 mg by mouth daily.      . B-D INS SYRINGE 0.5CC/31GX5/16 31G X 5/16" 0.5 ML MISC USE AS DIRECTED 100 each 5  . hydrochlorothiazide (HYDRODIURIL) 25 MG tablet TAKE 1 TABLET BY MOUTH DAILY 30 tablet 3  . insulin glargine (LANTUS) 100 UNIT/ML injection INJECT  38 UNITS INTO THE SKIN each morning as long as sugar above 90.    . labetalol (NORMODYNE) 200 MG tablet TAKE 1 TABLET BY MOUTH TWICE A DAY 180 tablet 1  . losartan (COZAAR) 100 MG tablet TAKE 1 TABLET EVERY DAY 90 tablet 1  . metFORMIN (GLUCOPHAGE) 1000 MG tablet TAKE 1 TABLET TWICE A DAY WITH MEALS 60 tablet 5  . omeprazole (PRILOSEC) 20 MG capsule Take 20 mg by mouth daily.      . ONE TOUCH ULTRA TEST test strip CHECK BLOOD SUGAR 3 TIMES DAILY 100 each 3  . simvastatin (ZOCOR) 20 MG tablet TAKE 1 TABLET AT BEDTIME 90 tablet 1     Objective: BP 120/82 mmHg  Pulse 77  Temp(Src) 98.1 F (36.7 C)  Wt 178 lb (80.74 kg) Gen: NAD, resting comfortably on table CV: RRR no murmurs rubs or gallops Lungs: CTAB no crackles, wheeze, rhonchi Abdomen: soft/nontender/nondistended/normal bowel sounds.  Ext: no edema Skin: warm, dry, no rash Neuro: grossly normal, moves all extremities  Assessment/Plan:  Tobacco abuse S: 1/2 PPD. Not ready to quit.  A/P: advised cessation    Diabetes mellitus, type 2 Fasting CBGs seem reasonable from 80s to 120s. Has not had any more hypoglycemia since he stopped taking extra insulin. Continue metformin. If a1c 8-9 consider adding low dose amaryl 2 mg. If <8 no changes due to cognitive issues.    Hyperlipidemia Check LDL today if >100, consider change to atorvastatin.    Essential hypertension Much improved control  when actually taking meds before visit. Continue Amlodipine 5mg , hctz 25mg , labetalol 200mg  BID, losartan 100mg .    3 month follow up  Orders Placed This Encounter  Procedures  . Hemoglobin A1c  . LDL cholesterol, direct    Alger  . HIV antibody    [facility]

## 2014-07-31 NOTE — Assessment & Plan Note (Signed)
Fasting CBGs seem reasonable from 80s to 120s. Has not had any more hypoglycemia since he stopped taking extra insulin. Continue metformin. If a1c 8-9 consider adding low dose amaryl 2 mg. If <8 no changes due to cognitive issues.

## 2014-07-31 NOTE — Assessment & Plan Note (Signed)
Much improved control when actually taking meds before visit. Continue Amlodipine 5mg , hctz 25mg , labetalol 200mg  BID, losartan 100mg .

## 2014-07-31 NOTE — Assessment & Plan Note (Signed)
Check LDL today if >100, consider change to atorvastatin.

## 2014-07-31 NOTE — Assessment & Plan Note (Signed)
S: 1/2 PPD. Not ready to quit.  A/P: advised cessation

## 2014-08-01 LAB — HIV ANTIBODY (ROUTINE TESTING W REFLEX): HIV: NONREACTIVE

## 2014-08-02 ENCOUNTER — Other Ambulatory Visit: Payer: Self-pay | Admitting: Internal Medicine

## 2014-08-31 ENCOUNTER — Other Ambulatory Visit: Payer: Self-pay | Admitting: Family Medicine

## 2014-08-31 ENCOUNTER — Telehealth: Payer: Self-pay | Admitting: Family Medicine

## 2014-08-31 MED ORDER — GLUCOSE BLOOD VI STRP: ORAL_STRIP | Status: DC

## 2014-08-31 NOTE — Telephone Encounter (Signed)
Medication refilled

## 2014-08-31 NOTE — Telephone Encounter (Signed)
Pt needs refill on one touch test strip [facility]. Pt has only 2 test strips left

## 2014-09-15 ENCOUNTER — Other Ambulatory Visit: Payer: Self-pay | Admitting: Internal Medicine

## 2014-09-17 ENCOUNTER — Other Ambulatory Visit: Payer: Self-pay | Admitting: Family Medicine

## 2014-10-09 ENCOUNTER — Other Ambulatory Visit: Payer: Self-pay | Admitting: Internal Medicine

## 2014-10-29 ENCOUNTER — Other Ambulatory Visit: Payer: Self-pay | Admitting: Family Medicine

## 2014-11-06 ENCOUNTER — Ambulatory Visit: Payer: Medicare Other | Admitting: Family Medicine

## 2014-11-07 ENCOUNTER — Encounter: Payer: Self-pay | Admitting: Family Medicine

## 2014-11-07 ENCOUNTER — Ambulatory Visit (INDEPENDENT_AMBULATORY_CARE_PROVIDER_SITE_OTHER): Payer: Medicare Other | Admitting: Family Medicine

## 2014-11-07 VITALS — BP 120/70 | HR 73 | Temp 97.7°F | Wt 177.0 lb

## 2014-11-07 DIAGNOSIS — E1142 Type 2 diabetes mellitus with diabetic polyneuropathy: Secondary | ICD-10-CM | POA: Diagnosis not present

## 2014-11-07 DIAGNOSIS — Z720 Tobacco use: Secondary | ICD-10-CM

## 2014-11-07 DIAGNOSIS — I10 Essential (primary) hypertension: Secondary | ICD-10-CM | POA: Diagnosis not present

## 2014-11-07 LAB — HEMOGLOBIN A1C: Hgb A1c MFr Bld: 8 % — ABNORMAL HIGH (ref 4.6–6.5)

## 2014-11-07 NOTE — Progress Notes (Signed)
Garret Reddish, MD  Subjective:  William [name] is a 54 y.o. year old very pleasant male patient who presents with:  Diabetes Mellitus II-improving control  Lab Results  Component Value Date   HGBA1C 7.5* 07/31/2014  Lantus 38 units in PM and metformin 1g BID. a1c goal 8 due to cognitive issues Lowest blood sugar has been 80. Before bed in 160s or 170s but has been as high as 200.  In the morning usually low 100s to 120s.  ROS- no hypoglycemia, blurry visions, chest pain, shortness of breath  Hypertension-controlled  BP Readings from Last 3 Encounters:  11/07/14 120/70  07/31/14 120/82  04/30/14 144/78   Home BP monitoring-no Compliant with medications-yes without side effects ROS-Denies any CP, HA, SOB, blurry vision, LE edema  Past Medical History- history cva, ckd II, hld  Medications- reviewed and updated Current Outpatient Prescriptions  Medication Sig Dispense Refill  . amLODipine (NORVASC) 5 MG tablet TAKE 1 TABLET BY MOUTH EVERY DAY 90 tablet 2  . aspirin 81 MG tablet Take 81 mg by mouth daily.      . B-D INS SYRINGE 0.5CC/31GX5/16 31G X 5/16" 0.5 ML MISC USE AS DIRECTED 100 each 2  . glucose blood (ONE TOUCH ULTRA TEST) test strip CHECK BLOOD SUGAR 3 TIMES DAILY. Dx: E11.9 100 each 3  . hydrochlorothiazide (HYDRODIURIL) 25 MG tablet TAKE 1 TABLET BY MOUTH DAILY 30 tablet 3  . insulin glargine (LANTUS) 100 UNIT/ML injection INJECT 38 UNITS INTO THE SKIN each morning as long as sugar above 90.    . labetalol (NORMODYNE) 200 MG tablet TAKE 1 TABLET BY MOUTH TWICE A DAY 180 tablet 1  . losartan (COZAAR) 100 MG tablet TAKE 1 TABLET EVERY DAY 90 tablet 1  . metFORMIN (GLUCOPHAGE) 1000 MG tablet TAKE 1 TABLET TWICE A DAY WITH MEALS 60 tablet 5  . omeprazole (PRILOSEC) 20 MG capsule Take 20 mg by mouth daily.      . simvastatin (ZOCOR) 20 MG tablet TAKE 1 TABLET AT BEDTIME 90 tablet 2    Objective: BP 120/70 mmHg  Pulse 73  Temp(Src) 97.7 F (36.5 C)  Wt 177 lb  (80.287 kg) Gen: NAD, resting comfortably CV: RRR no murmurs rubs or gallops Lungs: CTAB no crackles, wheeze, rhonchi Abdomen: soft/nontender/nondistended/normal bowel sounds. No rebound or guarding.  Ext: no edema Skin: warm, dry, no rash, prominent tan line at sockline Neuro: grossly normal, moves all extremities  Diabetic Foot Exam - Simple   Simple Foot Form  Visual Inspection  No deformities, no ulcerations, no other skin breakdown bilaterally:  Yes  Sensation Testing  Intact to touch and monofilament testing bilaterally:  Yes  Pulse Check  Posterior Tibialis and Dorsalis pulse intact bilaterally:  Yes  Comments  1+ pulses but no claudication symptoms, repeat at least yearly, would consider ABIs       Assessment/Plan:  Tobacco abuse Advised cessation. Not ready to quit. States 1/2 pPD  Diabetes mellitus, type 2 a1c goal <8 due to cognitive issues. At goal last visit. Hopeful at goal today- check a1c.  At present on lantus 38 units in PM (he switched on his own) and metformin 1g BID 3 month f/u given tendency for patient to self adjust meds- advised against.   Essential hypertension Well controlled. Continue current meds: Amlodipine 5mg , hctz 25mg , labetalol 200mg  BID, losartan 100mg .     3-4 mo f/u.   Orders Placed This Encounter  Procedures  . Hemoglobin A1c

## 2014-11-07 NOTE — Assessment & Plan Note (Signed)
a1c goal <8 due to cognitive issues. At goal last visit. Hopeful at goal today- check a1c.  At present on lantus 38 units in PM (he switched on his own) and metformin 1g BID 3 month f/u given tendency for patient to self adjust meds- advised against.

## 2014-11-07 NOTE — Patient Instructions (Addendum)
Get eye exam scheduled and have results faxed to [place] at [phone]. VERY IMPORTANT once again. Please get this done.   Quit smoking. Best thing you can do for your health  Lab before you leave- no changes to medications unless a1c above 8  3-4 month follow up

## 2014-11-07 NOTE — Assessment & Plan Note (Signed)
Well controlled. Continue current meds: Amlodipine 5mg , hctz 25mg , labetalol 200mg  BID, losartan 100mg .

## 2014-11-07 NOTE — Assessment & Plan Note (Signed)
Advised cessation. Not ready to quit. States 1/2 pPD

## 2014-12-16 ENCOUNTER — Other Ambulatory Visit: Payer: Self-pay | Admitting: Family Medicine

## 2015-01-22 ENCOUNTER — Other Ambulatory Visit: Payer: Self-pay | Admitting: Family Medicine

## 2015-02-26 ENCOUNTER — Other Ambulatory Visit: Payer: Self-pay | Admitting: Family Medicine

## 2015-03-11 ENCOUNTER — Encounter: Payer: Self-pay | Admitting: Family Medicine

## 2015-03-11 ENCOUNTER — Other Ambulatory Visit: Payer: Self-pay | Admitting: Family Medicine

## 2015-03-11 ENCOUNTER — Ambulatory Visit (INDEPENDENT_AMBULATORY_CARE_PROVIDER_SITE_OTHER): Payer: Medicare Other | Admitting: Family Medicine

## 2015-03-11 VITALS — BP 122/50 | HR 74 | Temp 97.8°F | Wt 179.0 lb

## 2015-03-11 DIAGNOSIS — Z794 Long term (current) use of insulin: Secondary | ICD-10-CM

## 2015-03-11 DIAGNOSIS — Z720 Tobacco use: Secondary | ICD-10-CM

## 2015-03-11 DIAGNOSIS — Z20828 Contact with and (suspected) exposure to other viral communicable diseases: Secondary | ICD-10-CM | POA: Diagnosis not present

## 2015-03-11 DIAGNOSIS — E1142 Type 2 diabetes mellitus with diabetic polyneuropathy: Secondary | ICD-10-CM

## 2015-03-11 DIAGNOSIS — I10 Essential (primary) hypertension: Secondary | ICD-10-CM

## 2015-03-11 DIAGNOSIS — E785 Hyperlipidemia, unspecified: Secondary | ICD-10-CM

## 2015-03-11 DIAGNOSIS — Z23 Encounter for immunization: Secondary | ICD-10-CM

## 2015-03-11 LAB — LIPID PANEL
Cholesterol: 117 mg/dL (ref 0–200)
HDL: 31.2 mg/dL — ABNORMAL LOW (ref >39.00)
LDL Cholesterol: 61 mg/dL (ref 0–99)
NONHDL: 85.85
Total CHOL/HDL Ratio: 4
Triglycerides: 126 mg/dL (ref 0.0–149.0)
VLDL: 25.2 mg/dL (ref 0.0–40.0)

## 2015-03-11 LAB — COMPREHENSIVE METABOLIC PANEL
ALK PHOS: 117 U/L (ref 39–117)
ALT: 15 U/L (ref 0–53)
AST: 12 U/L (ref 0–37)
Albumin: 3.7 g/dL (ref 3.5–5.2)
BILIRUBIN TOTAL: 0.3 mg/dL (ref 0.2–1.2)
BUN: 32 mg/dL — AB (ref 6–23)
CO2: 30 mEq/L (ref 19–32)
Calcium: 10.2 mg/dL (ref 8.4–10.5)
Chloride: 99 mEq/L (ref 96–112)
Creatinine, Ser: 1.31 mg/dL (ref 0.40–1.50)
GFR: 60.06 mL/min (ref >60.00)
GLUCOSE: 133 mg/dL — AB (ref 70–99)
Potassium: 5.2 mEq/L — ABNORMAL HIGH (ref 3.5–5.1)
SODIUM: 137 meq/L (ref 135–145)
TOTAL PROTEIN: 7.7 g/dL (ref 6.0–8.3)

## 2015-03-11 LAB — HEMOGLOBIN A1C: HEMOGLOBIN A1C: 7.9 % — AB (ref 4.6–6.5)

## 2015-03-11 NOTE — Assessment & Plan Note (Addendum)
S: mild poor control. On metformin 1g BID and lantus 38 units in PM CBGs- 120-180 in AM.  Did have one 4 a few weeks ago but barely ate dinner- encouraged to eat bedtime snack if lighter dinner, otherwise no low blood sugar Exercise and diet- tries to be active but no regular exercise  Lab Results  Component Value Date   HGBA1C 8.0* 11/07/2014   HGBA1C 7.5* 07/31/2014   HGBA1C 8.6* 04/30/2014   A/P: continue lantus 38 units and metformin 1g BID> check a1c. If above 8, likely add Amaryl. Firmly stressed precautions for hypoglycemia. Even when patient has episodes he appropriately handles. Also with nails curling around his toe- refer to podiatry for further care.

## 2015-03-11 NOTE — Assessment & Plan Note (Signed)
S: controlled. On  Amlodipine 5mg , hctz 25mg , labetalol 200mg  BID, losartan 100mg .  BP Readings from Last 3 Encounters:  03/11/15 122/50  11/07/14 120/70  07/31/14 120/82  A/P:Continue current meds:  No change

## 2015-03-11 NOTE — Progress Notes (Signed)
Garret Reddish, MD  Subjective:  William [name] is a 54 y.o. year old very pleasant male patient who presents for/with See problem oriented charting ROS- No chest pain or shortness of breath. No headache or blurry vision. Mild weakness with CBG 57- only 1 episode of hypoglycemia in 3 months.   Past Medical History-  Patient Active Problem List   Diagnosis Date Noted  . Tobacco abuse 11/21/2013    Priority: High  . Diabetes mellitus, type 2 ([place]) 09/28/2006    Priority: High  . History of cardiovascular disorder 09/28/2006    Priority: High  . CKD (chronic kidney disease), stage II 04/30/2014    Priority: Medium  . Hyperlipidemia 09/28/2006    Priority: Medium  . Essential hypertension 09/28/2006    Priority: Medium  . GERD (gastroesophageal reflux disease) 09/25/2013  . ONYCHOMYCOSIS, TOENAILS 03/22/2007    Medications- reviewed and updated Current Outpatient Prescriptions  Medication Sig Dispense Refill  . amLODipine (NORVASC) 5 MG tablet TAKE 1 TABLET BY MOUTH DAILY 90 tablet 2  . aspirin 81 MG tablet Take 81 mg by mouth daily.      . B-D INS SYRINGE 0.5CC/31GX5/16 31G X 5/16" 0.5 ML MISC USE AS DIRECTED 100 each 2  . hydrochlorothiazide (HYDRODIURIL) 25 MG tablet TAKE 1 TABLET BY MOUTH DAILY 30 tablet 5  . insulin glargine (LANTUS) 100 UNIT/ML injection INJECT 38 UNITS INTO THE SKIN each morning as long as sugar above 90.    . labetalol (NORMODYNE) 200 MG tablet TAKE 1 TABLET BY MOUTH TWICE A DAY 180 tablet 1  . LANTUS 100 UNIT/ML injection INJECT 40 UNITS INTO THE SKIN EACH MORNING AS LONG AS SUGAR ABOVE 90. 30 mL 5  . losartan (COZAAR) 100 MG tablet TAKE 1 TABLET EVERY DAY 90 tablet 1  . metFORMIN (GLUCOPHAGE) 1000 MG tablet TAKE 1 TABLET TWICE A DAY WITH MEALS 60 tablet 5  . omeprazole (PRILOSEC) 20 MG capsule Take 20 mg by mouth daily.      . ONE TOUCH ULTRA TEST test strip CHECK BLOOD SUGAR 3 TIMES DAILY 100 each 3  . simvastatin (ZOCOR) 20 MG tablet TAKE 1 TABLET AT  BEDTIME 90 tablet 2   No current facility-administered medications for this visit.    Objective: BP 122/50 mmHg  Pulse 74  Temp(Src) 97.8 F (36.6 C)  Wt 179 lb (81.194 kg) Gen: NAD, resting comfortably CV: RRR no murmurs rubs or gallops Lungs: CTAB no crackles, wheeze, rhonchi Abdomen: soft/nontender/nondistended/normal bowel sounds. No rebound or guarding.  Ext: no edema Skin: warm, dry Neuro: grossly normal, moves all extremities  Diabetic Foot Exam - Simple   Simple Foot Form  Diabetic Foot exam was performed with the following findings:  Yes 03/11/2015  9:39 AM  Visual Inspection  No deformities, no ulcerations, no other skin breakdown bilaterally:  Yes  Sensation Testing  Intact to touch and monofilament testing bilaterally:  Yes  Pulse Check  Posterior Tibialis and Dorsalis pulse intact bilaterally:  Yes  Comments  Callus at base of MTP bilaterally, layered on left. 1+ pulses- considering ABIs. Also has known onychomycosis.      Assessment/Plan:  Tobacco abuse Encouraged cessation from 1/2 PPD. Not ready but may cut down  Diabetes mellitus, type 2 (HCC) S: mild poor control. On metformin 1g BID and lantus 38 units in PM CBGs- 120-180 in AM.  Did have one 57 a few weeks ago but barely ate dinner- encouraged to eat bedtime snack if lighter dinner, otherwise no  low blood sugar Exercise and diet- tries to be active but no regular exercise  Lab Results  Component Value Date   HGBA1C 8.0* 11/07/2014   HGBA1C 7.5* 07/31/2014   HGBA1C 8.6* 04/30/2014   A/P: continue lantus 38 units and metformin 1g BID> check a1c. If above 8, likely add Amaryl. Firmly stressed precautions for hypoglycemia. Even when patient has episodes he appropriately handles. Also with nails curling around his toe- refer to podiatry for further care.      Hyperlipidemia S: well controlled on simvastatin 20mg  with LDL <70 on last check. No myalgias.  Lab Results  Component Value Date   CHOL  128 09/13/2013   HDL 24.80* 09/13/2013   LDLCALC 80 09/13/2013   LDLDIRECT 64.0 07/31/2014   TRIG 115.0 09/13/2013   CHOLHDL 5 09/13/2013   A/P: check full lipid panel today, hopeful to keep LDL <70. Consider atorvastatin 20mg     Essential hypertension S: controlled. On  Amlodipine 5mg , hctz 25mg , labetalol 200mg  BID, losartan 100mg .  BP Readings from Last 3 Encounters:  03/11/15 122/50  11/07/14 120/70  07/31/14 120/82  A/P:Continue current meds:  No change   3 months and a day follow up- verbal Return precautions advised.   Orders Placed This Encounter  Procedures  . Flu Vaccine QUAD 36+ mos IM  . Comprehensive metabolic panel    [facility]    Order Specific Question:  Has the patient fasted?    Answer:  No  . Lipid panel    [place]    Order Specific Question:  Has the patient fasted?    Answer:  No  . Hemoglobin A1c    [place]  . Hepatitis C antibody, reflex    [facility]  . CBC    [place]  . Ambulatory referral to Podiatry    Referral Priority:  Routine    Referral Type:  Consultation    Referral Reason:  Specialty Services Required    Requested Specialty:  Podiatry    Number of Visits Requested:  1

## 2015-03-11 NOTE — Assessment & Plan Note (Signed)
S: well controlled on simvastatin 20mg  with LDL <70 on last check. No myalgias.  Lab Results  Component Value Date   CHOL 128 09/13/2013   HDL 24.80* 09/13/2013   LDLCALC 80 09/13/2013   LDLDIRECT 64.0 07/31/2014   TRIG 115.0 09/13/2013   CHOLHDL 5 09/13/2013   A/P: check full lipid panel today, hopeful to keep LDL <70. Consider atorvastatin 20mg 

## 2015-03-11 NOTE — Patient Instructions (Addendum)
Flu shot received today.  Labs before you go  If your a1c is 8 or above, may add a medicine for you to take by mouth in the morning  Make sure to eat a bedtime snack if you eat a light meal for dinner

## 2015-03-11 NOTE — Assessment & Plan Note (Signed)
Encouraged cessation from 1/2 PPD. Not ready but may cut down

## 2015-03-12 LAB — CBC
HCT: 29.6 % — ABNORMAL LOW (ref 39.0–52.0)
Hemoglobin: 9.2 g/dL — ABNORMAL LOW (ref 13.0–17.0)
MCHC: 31 g/dL (ref 30.0–36.0)
MCV: 71.1 fl — ABNORMAL LOW (ref 78.0–100.0)
Platelets: 338 10*3/uL (ref 150.0–400.0)
RBC: 4.16 Mil/uL — ABNORMAL LOW (ref 4.22–5.81)
RDW: 19.8 % — AB (ref 11.5–15.5)
WBC: 9.2 10*3/uL (ref 4.0–10.5)

## 2015-03-12 LAB — HEPATITIS C ANTIBODY: HCV AB: NEGATIVE

## 2015-03-13 ENCOUNTER — Other Ambulatory Visit: Payer: Self-pay | Admitting: Family Medicine

## 2015-03-13 DIAGNOSIS — Z794 Long term (current) use of insulin: Principal | ICD-10-CM

## 2015-03-13 DIAGNOSIS — E1142 Type 2 diabetes mellitus with diabetic polyneuropathy: Secondary | ICD-10-CM

## 2015-03-16 ENCOUNTER — Other Ambulatory Visit: Payer: Self-pay | Admitting: Family Medicine

## 2015-03-20 ENCOUNTER — Encounter: Payer: Self-pay | Admitting: Podiatry

## 2015-03-20 ENCOUNTER — Ambulatory Visit (INDEPENDENT_AMBULATORY_CARE_PROVIDER_SITE_OTHER): Payer: Medicare Other | Admitting: Podiatry

## 2015-03-20 VITALS — BP 162/91 | HR 76 | Resp 14

## 2015-03-20 DIAGNOSIS — E1159 Type 2 diabetes mellitus with other circulatory complications: Secondary | ICD-10-CM | POA: Diagnosis not present

## 2015-03-20 DIAGNOSIS — M79676 Pain in unspecified toe(s): Secondary | ICD-10-CM

## 2015-03-20 DIAGNOSIS — B351 Tinea unguium: Secondary | ICD-10-CM | POA: Diagnosis not present

## 2015-03-20 NOTE — Progress Notes (Signed)
   Subjective:    Patient ID: William [name], male    DOB: June 16, 1960, 54 y.o.   MRN: [id number]  HPI this patient presents to the office with chief complaint of painful nails on both feet. He states that his nails are painful as he walks and worse in shoes.. Patient experienced a stroke 7-8 years ago. He presently is a diabetic on insulin and presents the office today for an evaluation of his diabetic feet  Patient presents here today for diabetic feet exam and B/L toenail trim.  Review of Systems  All other systems reviewed and are negative.      Objective:   Physical Exam GENERAL APPEARANCE: Alert, conversant. Appropriately groomed. No acute distress.  VASCULAR: Pedal pulses  Are not  palpable at  [facility] and PT bilateral.  Capillary refill time is immediate to all digits, . Cool feet noted.  NEUROLOGIC: sensation is normal to 5.07 monofilament at 5/5 sites bilateral.  Light touch is intact bilateral, Muscle strength normal.  MUSCULOSKELETAL: acceptable muscle strength, tone and stability bilateral.  Intrinsic muscluature intact bilateral.  Rectus appearance of foot and digits noted bilateral.   DERMATOLOGIC: skin color, texture, and turgor are within normal limits.  No preulcerative lesions or ulcers  are seen, no interdigital maceration noted.  No open lesions present.  . No drainage noted. NAILS  Thick disfigured discolored nails both feet from hallux to fifth toenails both feet.       Assessment & Plan:  Onychomycosis  Diabetes Mellitus  IE  Debride nails both feet.  Gardiner Barefoot DPM

## 2015-03-25 ENCOUNTER — Other Ambulatory Visit: Payer: Self-pay | Admitting: Family Medicine

## 2015-03-25 MED ORDER — METFORMIN HCL 1000 MG PO TABS: 1000.0000 mg | ORAL_TABLET | Freq: Two times a day (BID) | ORAL | Status: DC

## 2015-04-30 ENCOUNTER — Other Ambulatory Visit: Payer: Self-pay | Admitting: Family Medicine

## 2015-06-05 ENCOUNTER — Telehealth: Payer: Self-pay | Admitting: Family Medicine

## 2015-06-05 DIAGNOSIS — S81812A Laceration without foreign body, left lower leg, initial encounter: Secondary | ICD-10-CM | POA: Diagnosis not present

## 2015-06-05 NOTE — Telephone Encounter (Signed)
See below

## 2015-06-05 NOTE — Telephone Encounter (Signed)
[facility]  Patient Name: William [name]  DOB: 02/20/1959    Initial Comment Caller stated his blood pressure is 138/97 and he is having headaches.    Nurse Assessment  Nurse: Wayne Sever, RN, Tillie Rung Date/Time (Eastern Time): 06/05/2015 3:07:35 PM  Confirm and document reason for call. If symptomatic, describe symptoms. You must click the next button to save text entered. ---Caller states his BP is elevated. He woke up with a headache at [place] states he is currently having a tooth infection and is on pain medication and antibiotics. BP was 173/97 at the drug store. Caller states his BP is 138/99.  Has the patient traveled out of the country within the last 30 days? ---Not Applicable  Does the patient have any new or worsening symptoms? ---Yes  Will a triage be completed? ---Yes  Related visit to physician within the last 2 weeks? ---No  Does the PT have any chronic conditions? (i.e. diabetes, asthma, etc.) ---Yes  List chronic conditions. ---HTN  Is this a behavioral health or substance abuse call? ---No     Guidelines    Guideline Title Affirmed Question Affirmed Notes  High Blood Pressure [1] BP ? 140/90 AND [2] taking BP medications    Final Disposition User   See PCP within [place], RN, Tillie Rung    Comments  Caller scheduled for today, 02/15, at 545p   Referrals  REFERRED TO PCP OFFICE   Disagree/Comply: Comply

## 2015-06-11 ENCOUNTER — Ambulatory Visit (INDEPENDENT_AMBULATORY_CARE_PROVIDER_SITE_OTHER): Payer: Medicare Other | Admitting: Family Medicine

## 2015-06-11 ENCOUNTER — Encounter: Payer: Self-pay | Admitting: Family Medicine

## 2015-06-11 VITALS — BP 130/62 | HR 72 | Temp 97.6°F | Wt 182.0 lb

## 2015-06-11 DIAGNOSIS — D649 Anemia, unspecified: Secondary | ICD-10-CM | POA: Diagnosis not present

## 2015-06-11 DIAGNOSIS — E875 Hyperkalemia: Secondary | ICD-10-CM | POA: Diagnosis not present

## 2015-06-11 DIAGNOSIS — I10 Essential (primary) hypertension: Secondary | ICD-10-CM

## 2015-06-11 DIAGNOSIS — F17200 Nicotine dependence, unspecified, uncomplicated: Secondary | ICD-10-CM

## 2015-06-11 DIAGNOSIS — E1142 Type 2 diabetes mellitus with diabetic polyneuropathy: Secondary | ICD-10-CM | POA: Diagnosis not present

## 2015-06-11 DIAGNOSIS — Z794 Long term (current) use of insulin: Secondary | ICD-10-CM

## 2015-06-11 LAB — CBC
HEMATOCRIT: 29 % — AB (ref 39.0–52.0)
HEMOGLOBIN: 9.1 g/dL — AB (ref 13.0–17.0)
MCHC: 31.6 g/dL (ref 30.0–36.0)
MCV: 69.6 fl — ABNORMAL LOW (ref 78.0–100.0)
PLATELETS: 263 10*3/uL (ref 150.0–400.0)
RBC: 4.16 Mil/uL — ABNORMAL LOW (ref 4.22–5.81)
RDW: 19.1 % — ABNORMAL HIGH (ref 11.5–15.5)
WBC: 9.1 10*3/uL (ref 4.0–10.5)

## 2015-06-11 LAB — BASIC METABOLIC PANEL
BUN: 35 mg/dL — AB (ref 6–23)
CHLORIDE: 103 meq/L (ref 96–112)
CO2: 27 mEq/L (ref 19–32)
CREATININE: 1.55 mg/dL — AB (ref 0.40–1.50)
Calcium: 9.9 mg/dL (ref 8.4–10.5)
GFR: 49.42 mL/min — AB (ref >60.00)
GLUCOSE: 115 mg/dL — AB (ref 70–99)
Potassium: 4.4 mEq/L (ref 3.5–5.1)
Sodium: 139 mEq/L (ref 135–145)

## 2015-06-11 LAB — FERRITIN: FERRITIN: 6.4 ng/mL — AB (ref 22.0–322.0)

## 2015-06-11 NOTE — Patient Instructions (Addendum)
Have lab do the labs from last time as well as the one I added today  Schedule your eye exam and have them send [place] results  No changes to diabetes medicines- let me know if having anything below 70 or above 180  Strongly encourage you to quit smoking.  Call 1800 quit now Use gum 2mg  nicotine- use about 10-12 pieces a day

## 2015-06-11 NOTE — Assessment & Plan Note (Signed)
S: back up to PPD A/P: counseled 5 minutes on need for smoking cessation with prior stroke. Patient wants to trial gum. Encouraged 1800 quit now. Patient unwilling to set firm quit date

## 2015-06-11 NOTE — Assessment & Plan Note (Addendum)
S: reasonably controlled. On  lantus 38 units in PM and metformin 1g BID. a1c goal 8 due to cogntive issues CBGs- 90-120 in mornings mostly. 150 this am Lab Results  Component Value Date   HGBA1C 7.9* 03/11/2015   HGBA1C 8.0* 11/07/2014   HGBA1C 7.5* 07/31/2014   A/P: continue current medications. Day too early to check a1c unfortunately. Patient requests 4 month follow up but agrees to see me sooner if outside of 70-180 range fasting. Encouraged him to get eye exam

## 2015-06-11 NOTE — Assessment & Plan Note (Signed)
S: controlled on Amlodipine 5mg , hctz 25mg , labetalol 200mg  BID, losartan 100mg .  BP Readings from Last 3 Encounters:  06/11/15 130/62  03/20/15 162/91  03/11/15 122/50  A/P:Continue current medications, doing well

## 2015-06-11 NOTE — Progress Notes (Signed)
William Reddish, MD  Subjective:  William [name] is a 55 y.o. year old very pleasant male patient who presents for/with See problem oriented charting ROS- 1 episode of some mild shakiness/hypoglycemia with 69 in AM- exhibited appropriate response. No chest pain or shortness of breath. No headache or blurry vision.   Past Medical History-  Patient Active Problem List   Diagnosis Date Noted  . Tobacco abuse 11/21/2013    Priority: High  . Diabetes mellitus, type 2 ([place]) 09/28/2006    Priority: High  . History of cardiovascular disorder 09/28/2006    Priority: High  . CKD (chronic kidney disease), stage II 04/30/2014    Priority: Medium  . Hyperlipidemia 09/28/2006    Priority: Medium  . Essential hypertension 09/28/2006    Priority: Medium  . GERD (gastroesophageal reflux disease) 09/25/2013  . ONYCHOMYCOSIS, TOENAILS 03/22/2007    Medications- reviewed and updated Current Outpatient Prescriptions  Medication Sig Dispense Refill  . amLODipine (NORVASC) 5 MG tablet TAKE 1 TABLET BY MOUTH DAILY 90 tablet 2  . aspirin 81 MG tablet Take 81 mg by mouth daily.      . clindamycin (CLEOCIN) 300 MG capsule Take 300 mg by mouth 3 (three) times daily.    . hydrochlorothiazide (HYDRODIURIL) 25 MG tablet TAKE 1 TABLET BY MOUTH DAILY 30 tablet 5  . insulin glargine (LANTUS) 100 UNIT/ML injection INJECT 38 UNITS INTO THE SKIN each morning as long as sugar above 90.    . labetalol (NORMODYNE) 200 MG tablet TAKE 1 TABLET BY MOUTH TWICE A DAY 180 tablet 2  . LANTUS 100 UNIT/ML injection INJECT 40 UNITS INTO THE SKIN EACH MORNING AS LONG AS SUGAR ABOVE 90. 30 mL 5  . losartan (COZAAR) 100 MG tablet TAKE 1 TABLET EVERY DAY 90 tablet 1  . metFORMIN (GLUCOPHAGE) 1000 MG tablet Take 1 tablet (1,000 mg total) by mouth 2 (two) times daily with a meal. 180 tablet 5  . omeprazole (PRILOSEC) 20 MG capsule Take 20 mg by mouth daily.      . ONE TOUCH ULTRA TEST test strip CHECK BLOOD SUGAR 3 TIMES DAILY 100  each 3  . simvastatin (ZOCOR) 20 MG tablet TAKE 1 TABLET AT BEDTIME 90 tablet 2  . Insulin Syringe-Needle U-100 (B-D INS SYRINGE 0.5CC/31GX5/16) 31G X 5/16" 0.5 ML MISC Use to test blood sugars daily. Dx: E11.9 (Patient not taking: Reported on 06/11/2015) 100 each 3   No current facility-administered medications for this visit.    Updated Social History   Social History Narrative   Single. Lives alone will be living in a trailer near family friends Otho Perl and husband Renee Rival- will be staying in their trailer). They watch out for him.    Sister is also involved heavily.       Disabled after CVA, low literacy at baseline      Enjoys hunting   Objective: BP 130/62 mmHg  Pulse 72  Temp(Src) 97.6 F (36.4 C)  Wt 182 lb (82.555 kg) Gen: NAD, resting comfortably CV: RRR no murmurs rubs or gallops Lungs: CTAB no crackles, wheeze, rhonchi Abdomen: soft/nontender/nondistended/normal bowel sounds. No rebound or guarding.  Ext: no edema, are over left shin healing well with 19 stitches and no signs of infection- mild erythema but no warmth or tenderness Skin: warm, dry Neuro: grossly normal, moves all extremities  Assessment/Plan:  Tobacco abuse S: back up to PPD A/P: counseled 5 minutes on need for smoking cessation with prior stroke. Patient wants to trial  gum. Encouraged 1800 quit now. Patient unwilling to set firm quit date   Diabetes mellitus, type 2 ([place]) S: reasonably controlled. On  lantus 38 units in PM and metformin 1g BID. a1c goal 8 due to cogntive issues CBGs- 90-120 in mornings mostly. 150 this am Lab Results  Component Value Date   HGBA1C 7.9* 03/11/2015   HGBA1C 8.0* 11/07/2014   HGBA1C 7.5* 07/31/2014   A/P: continue current medications. Day too early to check a1c unfortunately. Patient requests 4 month follow up but agrees to see me sooner if outside of 70-180 range fasting. Encouraged him to get eye exam   Essential hypertension S: controlled on  Amlodipine 5mg , hctz 25mg , labetalol 200mg  BID, losartan 100mg .  BP Readings from Last 3 Encounters:  06/11/15 130/62  03/20/15 162/91  03/11/15 122/50  A/P:Continue current medications, doing well   Anemia S: did not receive our calls on anemia so has not followed up. Was unaware. Denies fatigue or shortness of breath.  A/P: labs from previous plan to be checked today  Repeat bmet due to hyperkalemia  [facility] with 19 stitches to left lower extremity after hitting it firmly against a table. Appears to be healing well. Done on Friday 17th with 12 day removal- he will follow up with that office  Return in about 4 months (around 10/09/2015) for follow up- or sooner if needed. Sooner Return precautions advised.   Orders Placed This Encounter  Procedures  . Basic metabolic panel    [place]    Meds ordered this encounter  Medications  . clindamycin (CLEOCIN) 300 MG capsule    Sig: Take 300 mg by mouth 3 (three) times daily.  per urgent care

## 2015-06-12 ENCOUNTER — Other Ambulatory Visit: Payer: Self-pay | Admitting: Family Medicine

## 2015-06-12 DIAGNOSIS — D509 Iron deficiency anemia, unspecified: Secondary | ICD-10-CM

## 2015-06-12 LAB — IRON AND TIBC
%SAT: 6 % — ABNORMAL LOW (ref 15–60)
Iron: 18 ug/dL — ABNORMAL LOW (ref 50–180)
TIBC: 318 ug/dL (ref 250–425)
UIBC: 300 ug/dL (ref 125–400)

## 2015-06-13 ENCOUNTER — Ambulatory Visit (INDEPENDENT_AMBULATORY_CARE_PROVIDER_SITE_OTHER): Payer: Medicare Other | Admitting: Podiatry

## 2015-06-13 ENCOUNTER — Encounter: Payer: Self-pay | Admitting: Podiatry

## 2015-06-13 DIAGNOSIS — M79676 Pain in unspecified toe(s): Secondary | ICD-10-CM

## 2015-06-13 DIAGNOSIS — E1159 Type 2 diabetes mellitus with other circulatory complications: Secondary | ICD-10-CM

## 2015-06-13 DIAGNOSIS — B351 Tinea unguium: Secondary | ICD-10-CM

## 2015-06-13 NOTE — Progress Notes (Signed)
   Subjective:    Patient ID: William [name], male    DOB: [birth year], 55 y.o.   MRN: [id number]  HPI this patient presents to the office with chief complaint of painful nails on both feet. He states that his nails are painful as he walks and worse in shoes.. Patient experienced a stroke 7-8 years ago. He presently is a diabetic on insulin and presents the office today for an evaluation of his diabetic feet  Patient presents here today for diabetic feet exam and B/L toenail trim.  Review of Systems  All other systems reviewed and are negative.      Objective:   Physical Exam GENERAL APPEARANCE: Alert, conversant. Appropriately groomed. No acute distress.  VASCULAR: Pedal pulses  Are not  palpable at  [facility] and PT bilateral.  Capillary refill time is immediate to all digits, . Cool feet noted.  NEUROLOGIC: sensation is normal to 5.07 monofilament at 5/5 sites bilateral.  Light touch is intact bilateral, Muscle strength normal.  MUSCULOSKELETAL: acceptable muscle strength, tone and stability bilateral.  Intrinsic muscluature intact bilateral.  Rectus appearance of foot and digits noted bilateral.   DERMATOLOGIC: skin color, texture, and turgor are within normal limits.  No preulcerative lesions or ulcers  are seen, no interdigital maceration noted.  No open lesions present.  . No drainage noted. NAILS  Thick disfigured discolored nails both feet from hallux to fifth toenails both feet.       Assessment & Plan:  Onychomycosis  Diabetes Mellitus  IE  Debride nails both feet.  Gardiner Barefoot DPM

## 2015-06-16 ENCOUNTER — Other Ambulatory Visit: Payer: Self-pay | Admitting: Family Medicine

## 2015-06-18 DIAGNOSIS — Z4802 Encounter for removal of sutures: Secondary | ICD-10-CM | POA: Diagnosis not present

## 2015-06-25 ENCOUNTER — Ambulatory Visit (INDEPENDENT_AMBULATORY_CARE_PROVIDER_SITE_OTHER): Payer: Medicare Other | Admitting: Gastroenterology

## 2015-06-25 ENCOUNTER — Other Ambulatory Visit (INDEPENDENT_AMBULATORY_CARE_PROVIDER_SITE_OTHER): Payer: Medicare Other

## 2015-06-25 ENCOUNTER — Encounter: Payer: Self-pay | Admitting: Gastroenterology

## 2015-06-25 VITALS — BP 120/70 | HR 68 | Ht 67.0 in | Wt 183.4 lb

## 2015-06-25 DIAGNOSIS — D509 Iron deficiency anemia, unspecified: Secondary | ICD-10-CM | POA: Diagnosis not present

## 2015-06-25 LAB — IGA: IGA: 723 mg/dL — AB (ref 68–378)

## 2015-06-25 NOTE — Progress Notes (Signed)
     06/25/2015 William [name] [id number] 05-14-1960   History of Present Illness:  This is a pleasant 55 year old male who is previously known to Dr. Ardis Hughs for colonoscopy in June 2015, which showed normal right colon, but hypervascular appearing findings in the remainder of the colon. Biopsies throughout the colon showed benign mucosa with melanosis coli and some lamina propria hemorrhage. He was having diarrhea at the time so this was thought to be due to infectious source. He presents to our office today at the request of his PCP, Dr. Yong Channel, for evaluation regarding iron deficiency anemia. In 08/2013 his hemoglobin was 11.2 g. When it was checked in November 2016 it was 9.2 g and recheck just 2 weeks ago was 9.1 g. Iron studies show a ferritin of 6.4, iron percent sat of 6, serum iron of 18, TIBC normal at 318. He has not been yet placed on iron supplements, but this is being discussed with his PCP. He denies absolutely any GI complaints. He denies seeing dark or bloody stools. He is no longer having diarrhea. He admits to may be rare occasion of diarrhea for which he uses Imodium, but definitely nothing on a regular basis. Denies abdominal pain, heartburn or reflux, dysphasia, weight loss. Says that his appetite is good.   Current Medications, Allergies, Past Medical History, Past Surgical History, Family History and Social History were reviewed in [employer] record.   Physical Exam: BP 120/70 mmHg  Pulse 68  Ht 5\' 7"  (1.702 m)  Wt 183 lb 6.4 oz (83.19 kg)  BMI 28.72 kg/m2 General: Well developed white male in no acute distress Head: Normocephalic and atraumatic Eyes:  Sclerae anicteric, conjunctiva pink  Ears: Normal auditory acuity Lungs: Clear throughout to auscultation Heart: Regular rate and rhythm Abdomen: Soft, non-distended.  Normal bowel sounds.  Non-tender. Musculoskeletal: Symmetrical with no gross deformities  Extremities: No edema  Neurological:  Alert oriented x 4, grossly non-focal Psychological:  Alert and cooperative. Normal mood and affect  Assessment and Recommendations: -Iron deficiency anemia:  Patient has absolutely no GI complaints. Last colonoscopy in 2015 with suspected resolving infectious colitis. We will start by scheduling EGD since he has never had that performed. We'll then determine if possible repeat colonoscopy would be needed versus other small bowel evaluation, etc.  Will check celiac labs as well.  Can start PO iron vs iron infusions as per PCP recommendations.

## 2015-06-25 NOTE — Patient Instructions (Addendum)
You have been scheduled for an endoscopy. Please follow written instructions given to you at your visit today. If you use inhalers (even only as needed), please bring them with you on the day of your procedure. Your physician has requested that you go to www.startemmi.com and enter the access code given to you at your visit today. This web site gives a general overview about your procedure. However, you should still follow specific instructions given to you by our office regarding your preparation for the procedure.  Your physician has requested that you go to the basement for lab work before leaving today.  _  _   ORAL DIABETIC MEDICATION INSTRUCTIONS  The day before your procedure:  Take your diabetic pill as you do normally  The day of your procedure:  Do not take your diabetic pill   We will check your blood sugar levels during the admission process and again in Recovery before discharging you home  ______________________________________________________________________  _  _   INSULIN (LONG ACTING) MEDICATION INSTRUCTIONS (Lantus, NPH, 70/30, Humulin, Novolin-N, Levemir, )   The day before your procedure:  Take  your regular evening dose    The day of your procedure:  Do not take your morning dose

## 2015-06-26 LAB — TISSUE TRANSGLUTAMINASE, IGA: Tissue Transglutaminase Ab, IgA: 2 U/mL (ref <4)

## 2015-06-26 NOTE — Progress Notes (Signed)
i agree with the above note, plan 

## 2015-07-10 ENCOUNTER — Ambulatory Visit (AMBULATORY_SURGERY_CENTER): Payer: Medicare Other | Admitting: Gastroenterology

## 2015-07-10 ENCOUNTER — Encounter: Payer: Self-pay | Admitting: Gastroenterology

## 2015-07-10 VITALS — BP 143/83 | HR 71 | Temp 98.7°F | Resp 32 | Ht 67.0 in | Wt 183.0 lb

## 2015-07-10 DIAGNOSIS — D509 Iron deficiency anemia, unspecified: Secondary | ICD-10-CM | POA: Diagnosis not present

## 2015-07-10 DIAGNOSIS — I10 Essential (primary) hypertension: Secondary | ICD-10-CM | POA: Diagnosis not present

## 2015-07-10 DIAGNOSIS — Z8673 Personal history of transient ischemic attack (TIA), and cerebral infarction without residual deficits: Secondary | ICD-10-CM | POA: Diagnosis not present

## 2015-07-10 DIAGNOSIS — I2510 Atherosclerotic heart disease of native coronary artery without angina pectoris: Secondary | ICD-10-CM | POA: Diagnosis not present

## 2015-07-10 DIAGNOSIS — E119 Type 2 diabetes mellitus without complications: Secondary | ICD-10-CM | POA: Diagnosis not present

## 2015-07-10 LAB — GLUCOSE, CAPILLARY
GLUCOSE-CAPILLARY: 66 mg/dL (ref 65–99)
Glucose-Capillary: 113 mg/dL — ABNORMAL HIGH (ref 65–99)

## 2015-07-10 MED ORDER — DEXTROSE 5 % IV SOLN: INTRAVENOUS | Status: DC

## 2015-07-10 NOTE — Progress Notes (Signed)
Made CRNA aware that pt. Stated that he took a sip of water enough to wet his mouth 30 minutes ago.

## 2015-07-10 NOTE — Patient Instructions (Signed)
YOU HAD AN ENDOSCOPIC PROCEDURE TODAY AT [facility]:   Refer to the procedure report that was given to you for any specific questions about what was found during the examination.  If the procedure report does not answer your questions, please call your gastroenterologist to clarify.  If you requested that your care partner not be given the details of your procedure findings, then the procedure report has been included in a sealed envelope for you to review at your convenience later.  Please Note:  You might notice some irritation and congestion in your nose or some drainage.  This is from the oxygen used during your procedure.  There is no need for concern and it should clear up in a day or so.  SYMPTOMS TO REPORT IMMEDIATELY:    Following upper endoscopy (EGD)  Vomiting of blood or coffee ground material  New chest pain or pain under the shoulder blades  Painful or persistently difficult swallowing  New shortness of breath  Fever of 100F or higher  Black, tarry-looking stools  For urgent or emergent issues, a gastroenterologist can be reached at any hour by calling [phone].   DIET: Your first meal following the procedure should be a small meal and then it is ok to progress to your normal diet. Heavy or fried foods are harder to digest and may make you feel nauseous or bloated.  Likewise, meals heavy in dairy and vegetables can increase bloating.  Drink plenty of fluids but you should avoid alcoholic beverages for 24 hours.  ACTIVITY:  You should plan to take it easy for the rest of today and you should NOT DRIVE or use heavy machinery until tomorrow (because of the sedation medicines used during the test).    FOLLOW UP: Our staff will call the number listed on your records the next business day following your procedure to check on you and address any questions or concerns that you may have regarding the information given to you following your procedure. If we do not  reach you, we will leave a message.  However, if you are feeling well and you are not experiencing any problems, there is no need to return our call.  We will assume that you have returned to your regular daily activities without incident.  If any biopsies were taken you will be contacted by phone or by letter within the next 1-3 weeks.  Please call us at [phone] if you have not heard about the biopsies in 3 weeks.    SIGNATURES/CONFIDENTIALITY: You and/or your care partner have signed paperwork which will be entered into your electronic medical record.  These signatures attest to the fact that that the information above on your After Visit Summary has been reviewed and is understood.  Full responsibility of the confidentiality of this discharge information lies with you and/or your care-partner.  Await pathology results  resume medication and diet

## 2015-07-10 NOTE — Op Note (Signed)
[place] Patient Name: William [name] Procedure Date: 07/10/2015 10:07 AM MRN: [id number] Endoscopist: Milus Banister , MD Age: 55 Referring MD:  Date of Birth: 04-10-1961 Gender: Male Procedure:                Upper GI endoscopy Indications:              Iron deficiency anemia (Hb 9.1, low MCV, low                            ferritin; colonsocopy 2015 Dr. Ardis Hughs found ? mild                            colitis) Medicines:                Monitored Anesthesia Care Procedure:                Pre-Anesthesia Assessment:                           - Prior to the procedure, a History and Physical                            was performed, and patient medications and                            allergies were reviewed. The patient's tolerance of                            previous anesthesia was also reviewed. The risks                            and benefits of the procedure and the sedation                            options and risks were discussed with the patient.                            All questions were answered, and informed consent                            was obtained. Prior Anticoagulants: The patient has                            taken no previous anticoagulant or antiplatelet                            agents. ASA Grade Assessment: II - A patient with                            mild systemic disease. After reviewing the risks                            and benefits, the patient was deemed in  satisfactory condition to undergo the procedure.                           After obtaining informed consent, the endoscope was                            passed under direct vision. Throughout the                            procedure, the patient's blood pressure, pulse, and                            oxygen saturations were monitored continuously. The                            Model GIF-HQ190 [id number]) scope was introduced          through the mouth, and advanced to the second part                            of duodenum. The upper GI endoscopy was                            accomplished without difficulty. The patient                            tolerated the procedure well. Scope In: Scope Out: Findings:      The esophagus was normal.      The stomach was normal.      The examined duodenum was normal. Biopsies for histology were taken with       a cold forceps for evaluation of celiac disease. Complications:            No immediate complications. Estimated Blood Loss:     Estimated blood loss: none. Impression:               - Normal esophagus.                           - Normal stomach.                           - Normal examined duodenum. Biopsied. Recommendation:           - Patient has a contact number available for                            emergencies. The signs and symptoms of potential                            delayed complications were discussed with the                            patient. Return to normal activities tomorrow.                            Written discharge instructions were provided to the  patient.                           - Resume previous diet.                           - Continue present medications.                           - Await pathology results. If the biopsies are                            normal, you will likely be recommended to have                            repeat colonoscopy. Procedure Code(s):        --- Professional ---                           [phone], Esophagogastroduodenoscopy, flexible,                            transoral; with biopsy, single or multiple CPT copyright 2016 American Medical Association. All rights reserved. Milus Banister, MD Milus Banister, MD 07/10/2015 10:19:03 AM Number of Addenda: 0 Referring MD:      Maryellen Pile

## 2015-07-10 NOTE — Progress Notes (Signed)
Called to room to assist during endoscopic procedure.  Patient ID and intended procedure confirmed with present staff. Received instructions for my participation in the procedure from the performing physician.  

## 2015-07-10 NOTE — Progress Notes (Signed)
To recovery, report to Smith, RN, VSS 

## 2015-07-11 ENCOUNTER — Telehealth: Payer: Self-pay

## 2015-07-11 NOTE — Telephone Encounter (Signed)
Left message on answering machine. 

## 2015-07-18 ENCOUNTER — Telehealth: Payer: Self-pay | Admitting: Gastroenterology

## 2015-07-18 NOTE — Telephone Encounter (Signed)
patient sister called in stating that patient is a little confused as to what his test results are. she states that he talked to a nurse yesterday. [name] is requesting that a nurse call her today regarding pathology results. please ask for [name], she is at work today.

## 2015-07-18 NOTE — Telephone Encounter (Signed)
The pt's sister was given the results of the Endo biopsy and the appt for the previsit and colon.  She will call back with any further concerns.

## 2015-08-21 ENCOUNTER — Other Ambulatory Visit: Payer: Self-pay | Admitting: Family Medicine

## 2015-08-28 ENCOUNTER — Ambulatory Visit (AMBULATORY_SURGERY_CENTER): Payer: Self-pay | Admitting: *Deleted

## 2015-08-28 VITALS — Ht 67.0 in | Wt 178.0 lb

## 2015-08-28 DIAGNOSIS — D509 Iron deficiency anemia, unspecified: Secondary | ICD-10-CM

## 2015-08-28 MED ORDER — NA SULFATE-K SULFATE-MG SULF 17.5-3.13-1.6 GM/177ML PO SOLN: ORAL | Status: DC

## 2015-08-28 NOTE — Progress Notes (Signed)
Sister at patient's side during pre-visit today. Patient and sister denies any allergies to eggs or soy. Patient denies any problems with anesthesia/sedation. Patient denies any oxygen use at home and does not take any diet/weight loss medications. Patient declined EMMI education.

## 2015-09-03 ENCOUNTER — Ambulatory Visit (AMBULATORY_SURGERY_CENTER): Payer: Medicare Other | Admitting: Gastroenterology

## 2015-09-03 ENCOUNTER — Encounter: Payer: Self-pay | Admitting: Gastroenterology

## 2015-09-03 VITALS — BP 163/58 | HR 68 | Temp 98.7°F | Resp 13 | Ht 67.0 in | Wt 178.0 lb

## 2015-09-03 DIAGNOSIS — R197 Diarrhea, unspecified: Secondary | ICD-10-CM | POA: Diagnosis not present

## 2015-09-03 DIAGNOSIS — D509 Iron deficiency anemia, unspecified: Secondary | ICD-10-CM

## 2015-09-03 DIAGNOSIS — D649 Anemia, unspecified: Secondary | ICD-10-CM | POA: Diagnosis not present

## 2015-09-03 DIAGNOSIS — K6389 Other specified diseases of intestine: Secondary | ICD-10-CM | POA: Diagnosis not present

## 2015-09-03 LAB — GLUCOSE, CAPILLARY
GLUCOSE-CAPILLARY: 124 mg/dL — AB (ref 65–99)
Glucose-Capillary: 126 mg/dL — ABNORMAL HIGH (ref 65–99)

## 2015-09-03 MED ORDER — SODIUM CHLORIDE 0.9 % IV SOLN: 500.0000 mL | INTRAVENOUS | Status: DC

## 2015-09-03 NOTE — Progress Notes (Signed)
Called to room to assist during endoscopic procedure.  Patient ID and intended procedure confirmed with present staff. Received instructions for my participation in the procedure from the performing physician.  

## 2015-09-03 NOTE — Op Note (Signed)
[place] Patient Name: William [name] Procedure Date: 09/03/2015 7:21 AM MRN: [id number] Endoscopist: Milus Banister , MD Age: 55 Referring MD:  Date of Birth: 09-03-1960 Gender: Male Procedure:                Colonoscopy Indications:              Iron deficiency anemia Medicines:                Monitored Anesthesia Care Procedure:                Pre-Anesthesia Assessment:                           - Prior to the procedure, a History and Physical                            was performed, and patient medications and                            allergies were reviewed. The patient's tolerance of                            previous anesthesia was also reviewed. The risks                            and benefits of the procedure and the sedation                            options and risks were discussed with the patient.                            All questions were answered, and informed consent                            was obtained. Prior Anticoagulants: The patient has                            taken no previous anticoagulant or antiplatelet                            agents. ASA Grade Assessment: II - A patient with                            mild systemic disease. After reviewing the risks                            and benefits, the patient was deemed in                            satisfactory condition to undergo the procedure.                           After obtaining informed consent, the colonoscope  was passed under direct vision. Throughout the                            procedure, the patient's blood pressure, pulse, and                            oxygen saturations were monitored continuously. The                            Model PCF-[id number]) scope was introduced                            through the anus and advanced to the the cecum,                            identified by appendiceal orifice and ileocecal                  valve. The colonoscopy was performed without                            difficulty. The patient tolerated the procedure                            well. The quality of the bowel preparation was                            good. The ileocecal valve, appendiceal orifice, and                            rectum were photographed. Scope In: 7:41:43 AM Scope Out: 8:00:20 AM Scope Withdrawal Time: 0 hours 9 minutes 56 seconds  Total Procedure Duration: 0 hours 18 minutes 37 seconds  Findings:                 Many small and large-mouthed diverticula were found                            in the left colon. Biopsies for histology were                            taken with a cold forceps from the entire colon for                            evaluation of microscopic colitis.                           I was unable to intubate the terminal ileum due to                            scope angulation, it appeared normal from distal                            view however.  The exam was otherwise without abnormality on                            direct and retroflexion views. Complications:            No immediate complications. Estimated blood loss:                            None. Estimated Blood Loss:     Estimated blood loss: none. Impression:               - Diverticulosis in the left colon. Biopsied.                           - The examination was otherwise normal on direct                            and retroflexion views. Recommendation:           - Patient has a contact number available for                            emergencies. The signs and symptoms of potential                            delayed complications were discussed with the                            patient. Return to normal activities tomorrow.                            Written discharge instructions were provided to the                            patient.                           - Resume  previous diet.                           - Continue present medications.                           - Await pathology results. You may need further                            testing for your anemia pending these results.                           - Repeat colonoscopy in 10 years for screening                            purposes. Milus Banister, MD 09/03/2015 8:05:02 AM This report has been signed electronically.

## 2015-09-03 NOTE — Patient Instructions (Signed)
Discharge instructions given. Handouts on diverticulosis and a high fiber diet Resume previous medications. YOU HAD AN ENDOSCOPIC PROCEDURE TODAY AT THE [facility]:   Refer to the procedure report that was given to you for any specific questions about what was found during the examination.  If the procedure report does not answer your questions, please call your gastroenterologist to clarify.  If you requested that your care partner not be given the details of your procedure findings, then the procedure report has been included in a sealed envelope for you to review at your convenience later.  YOU SHOULD EXPECT: Some feelings of bloating in the abdomen. Passage of more gas than usual.  Walking can help get rid of the air that was put into your GI tract during the procedure and reduce the bloating. If you had a lower endoscopy (such as a colonoscopy or flexible sigmoidoscopy) you may notice spotting of blood in your stool or on the toilet paper. If you underwent a bowel prep for your procedure, you may not have a normal bowel movement for a few days.  Please Note:  You might notice some irritation and congestion in your nose or some drainage.  This is from the oxygen used during your procedure.  There is no need for concern and it should clear up in a day or so.  SYMPTOMS TO REPORT IMMEDIATELY:   Following lower endoscopy (colonoscopy or flexible sigmoidoscopy):  Excessive amounts of blood in the stool  Significant tenderness or worsening of abdominal pains  Swelling of the abdomen that is new, acute  Fever of 100F or higher   For urgent or emergent issues, a gastroenterologist can be reached at any hour by calling [phone].   DIET: Your first meal following the procedure should be a small meal and then it is ok to progress to your normal diet. Heavy or fried foods are harder to digest and may make you feel nauseous or bloated.  Likewise, meals heavy in dairy and vegetables  can increase bloating.  Drink plenty of fluids but you should avoid alcoholic beverages for 24 hours.  ACTIVITY:  You should plan to take it easy for the rest of today and you should NOT DRIVE or use heavy machinery until tomorrow (because of the sedation medicines used during the test).    FOLLOW UP: Our staff will call the number listed on your records the next business day following your procedure to check on you and address any questions or concerns that you may have regarding the information given to you following your procedure. If we do not reach you, we will leave a message.  However, if you are feeling well and you are not experiencing any problems, there is no need to return our call.  We will assume that you have returned to your regular daily activities without incident.  If any biopsies were taken you will be contacted by phone or by letter within the next 1-3 weeks.  Please call us at [phone] if you have not heard about the biopsies in 3 weeks.    SIGNATURES/CONFIDENTIALITY: You and/or your care partner have signed paperwork which will be entered into your electronic medical record.  These signatures attest to the fact that that the information above on your After Visit Summary has been reviewed and is understood.  Full responsibility of the confidentiality of this discharge information lies with you and/or your care-partner. 

## 2015-09-03 NOTE — Progress Notes (Signed)
A/ox3 pleased with MAC, report to Celia RN 

## 2015-09-04 ENCOUNTER — Telehealth: Payer: Self-pay

## 2015-09-04 NOTE — Telephone Encounter (Signed)
  Follow up Call-  Call back number 09/03/2015 09/03/2015 07/10/2015 09/29/2013 09/29/2013  Post procedure Call Back phone  # [phone] (No Data) [phone] Goldie/sister [phone] -  Phone comments - call [phone] not right number - - -  Permission to leave phone message - Yes Yes Yes Yes     Patient questions:  Do you have a fever, pain , or abdominal swelling? No. Pain Score  0 *  Have you tolerated food without any problems? Yes.    Have you been able to return to your normal activities? Yes.    Do you have any questions about your discharge instructions: Diet   No. Medications  No. Follow up visit  No.  Do you have questions or concerns about your Care? No.  Actions: * If pain score is 4 or above: No action needed, pain <4.  No problems per the pt. maw

## 2015-09-05 ENCOUNTER — Ambulatory Visit: Payer: Medicare Other | Admitting: Podiatry

## 2015-09-10 ENCOUNTER — Other Ambulatory Visit: Payer: Self-pay

## 2015-09-10 ENCOUNTER — Other Ambulatory Visit (INDEPENDENT_AMBULATORY_CARE_PROVIDER_SITE_OTHER): Payer: Medicare Other

## 2015-09-10 DIAGNOSIS — D509 Iron deficiency anemia, unspecified: Secondary | ICD-10-CM

## 2015-09-10 LAB — CBC WITH DIFFERENTIAL/PLATELET
BASOS ABS: 0.1 10*3/uL (ref 0.0–0.1)
BASOS PCT: 1.1 % (ref 0.0–3.0)
EOS ABS: 0.5 10*3/uL (ref 0.0–0.7)
Eosinophils Relative: 4.5 % (ref 0.0–5.0)
HCT: 30.7 % — ABNORMAL LOW (ref 39.0–52.0)
HEMOGLOBIN: 9.6 g/dL — AB (ref 13.0–17.0)
Lymphocytes Relative: 17.3 % (ref 12.0–46.0)
Lymphs Abs: 1.8 10*3/uL (ref 0.7–4.0)
MCHC: 31.3 g/dL (ref 30.0–36.0)
MCV: 71.2 fl — ABNORMAL LOW (ref 78.0–100.0)
MONO ABS: 0.8 10*3/uL (ref 0.1–1.0)
Monocytes Relative: 8.1 % (ref 3.0–12.0)
NEUTROS ABS: 7.1 10*3/uL (ref 1.4–7.7)
Neutrophils Relative %: 69 % (ref 43.0–77.0)
PLATELETS: 279 10*3/uL (ref 150.0–400.0)
RBC: 4.31 Mil/uL (ref 4.22–5.81)
RDW: 18.2 % — AB (ref 11.5–15.5)
WBC: 10.3 10*3/uL (ref 4.0–10.5)

## 2015-09-17 ENCOUNTER — Other Ambulatory Visit: Payer: Self-pay

## 2015-09-17 DIAGNOSIS — D509 Iron deficiency anemia, unspecified: Secondary | ICD-10-CM

## 2015-10-01 ENCOUNTER — Telehealth: Payer: Self-pay | Admitting: Gastroenterology

## 2015-10-01 NOTE — Telephone Encounter (Signed)
Pt advised to complete the stool test that was not wet as directed and we will call when the results are received.

## 2015-10-09 ENCOUNTER — Encounter: Payer: Self-pay | Admitting: Family Medicine

## 2015-10-09 ENCOUNTER — Ambulatory Visit (INDEPENDENT_AMBULATORY_CARE_PROVIDER_SITE_OTHER): Payer: Medicare Other | Admitting: Family Medicine

## 2015-10-09 VITALS — BP 106/66 | HR 69 | Temp 97.6°F | Ht 67.0 in | Wt 172.0 lb

## 2015-10-09 DIAGNOSIS — E1142 Type 2 diabetes mellitus with diabetic polyneuropathy: Secondary | ICD-10-CM

## 2015-10-09 DIAGNOSIS — I10 Essential (primary) hypertension: Secondary | ICD-10-CM

## 2015-10-09 DIAGNOSIS — I739 Peripheral vascular disease, unspecified: Secondary | ICD-10-CM | POA: Diagnosis not present

## 2015-10-09 DIAGNOSIS — Z794 Long term (current) use of insulin: Secondary | ICD-10-CM

## 2015-10-09 DIAGNOSIS — H6122 Impacted cerumen, left ear: Secondary | ICD-10-CM

## 2015-10-09 DIAGNOSIS — E785 Hyperlipidemia, unspecified: Secondary | ICD-10-CM | POA: Diagnosis not present

## 2015-10-09 LAB — CBC
HCT: 30.2 % — ABNORMAL LOW (ref 39.0–52.0)
HEMOGLOBIN: 9.5 g/dL — AB (ref 13.0–17.0)
MCHC: 31.5 g/dL (ref 30.0–36.0)
MCV: 71.8 fl — AB (ref 78.0–100.0)
PLATELETS: 344 10*3/uL (ref 150.0–400.0)
RBC: 4.2 Mil/uL — ABNORMAL LOW (ref 4.22–5.81)
RDW: 18.4 % — ABNORMAL HIGH (ref 11.5–15.5)
WBC: 9.9 10*3/uL (ref 4.0–10.5)

## 2015-10-09 LAB — COMPREHENSIVE METABOLIC PANEL WITH GFR
ALT: 9 U/L (ref 0–53)
AST: 10 U/L (ref 0–37)
Albumin: 3.7 g/dL (ref 3.5–5.2)
Alkaline Phosphatase: 93 U/L (ref 39–117)
BUN: 34 mg/dL — ABNORMAL HIGH (ref 6–23)
CO2: 27 meq/L (ref 19–32)
Calcium: 9.2 mg/dL (ref 8.4–10.5)
Chloride: 101 meq/L (ref 96–112)
Creatinine, Ser: 1.51 mg/dL — ABNORMAL HIGH (ref 0.40–1.50)
GFR: 50.88 mL/min — ABNORMAL LOW
Glucose, Bld: 119 mg/dL — ABNORMAL HIGH (ref 70–99)
Potassium: 4.7 meq/L (ref 3.5–5.1)
Sodium: 138 meq/L (ref 135–145)
Total Bilirubin: 0.3 mg/dL (ref 0.2–1.2)
Total Protein: 7.1 g/dL (ref 6.0–8.3)

## 2015-10-09 LAB — HEMOGLOBIN A1C: HEMOGLOBIN A1C: 8.1 % — AB (ref 4.6–6.5)

## 2015-10-09 LAB — LDL CHOLESTEROL, DIRECT: LDL DIRECT: 80 mg/dL

## 2015-10-09 MED ORDER — FERROUS SULFATE 325 (65 FE) MG PO TABS: 325.0000 mg | ORAL_TABLET | Freq: Two times a day (BID) | ORAL | Status: DC

## 2015-10-09 NOTE — Progress Notes (Signed)
Subjective:  William [name] is a 55 y.o. year old very pleasant male patient who presents for/with See problem oriented charting ROS- pain in calves with walking. No chest pain or shortness of breath. No headache or blurry vision. .see any ROS included in HPI as well.   Past Medical History-  Patient Active Problem List   Diagnosis Date Noted  . Iron deficiency anemia 06/25/2015    Priority: High  . Tobacco use disorder 11/21/2013    Priority: High  . Diabetes mellitus, type 2 ([place]) 09/28/2006    Priority: High  . History of cardiovascular disorder 09/28/2006    Priority: High  . CKD (chronic kidney disease), stage II 04/30/2014    Priority: Medium  . Hyperlipidemia 09/28/2006    Priority: Medium  . Essential hypertension 09/28/2006    Priority: Medium  . Claudication ([place]) 10/09/2015  . GERD (gastroesophageal reflux disease) 09/25/2013  . ONYCHOMYCOSIS, TOENAILS 03/22/2007    Medications- reviewed and updated Current Outpatient Prescriptions  Medication Sig Dispense Refill  . amLODipine (NORVASC) 5 MG tablet TAKE 1 TABLET BY MOUTH DAILY 90 tablet 2  . aspirin 81 MG tablet Take 81 mg by mouth daily.      . hydrochlorothiazide (HYDRODIURIL) 25 MG tablet TAKE 1 TABLET BY MOUTH DAILY 30 tablet 5  . Insulin Syringe-Needle U-100 (B-D INS SYRINGE 0.5CC/31GX5/16) 31G X 5/16" 0.5 ML MISC Use to test blood sugars daily. Dx: E11.9 100 each 3  . labetalol (NORMODYNE) 200 MG tablet TAKE 1 TABLET BY MOUTH TWICE A DAY 180 tablet 2  . LANTUS 100 UNIT/ML injection INJECT 40 UNITS INTO THE SKIN EACH MORNING AS LONG AS SUGAR ABOVE 90. 30 mL 3  . losartan (COZAAR) 100 MG tablet TAKE 1 TABLET EVERY DAY 90 tablet 1  . metFORMIN (GLUCOPHAGE) 1000 MG tablet Take 1 tablet (1,000 mg total) by mouth 2 (two) times daily with a meal. 180 tablet 5  . omeprazole (PRILOSEC) 20 MG capsule Take 20 mg by mouth daily.      . ONE TOUCH ULTRA TEST test strip CHECK BLOOD SUGAR 3 TIMES DAILY 100 each 3  .  simvastatin (ZOCOR) 20 MG tablet TAKE 1 TABLET AT BEDTIME 90 tablet 2   No current facility-administered medications for this visit.    Objective: BP 106/66 mmHg  Pulse 69  Temp(Src) 97.6 F (36.4 C) (Oral)  Ht 5\' 7"  (1.702 m)  Wt 172 lb (78.019 kg)  BMI 26.93 kg/m2  SpO2 99% Gen: NAD, resting comfortably, smells of smoke Right TM- able to curette out wax Left TM- completely occluded with cerumen, after irrigation, TM normal CV: RRR no murmurs rubs or gallops Lungs: CTAB no crackles, wheeze, rhonchi Abdomen: soft/nontender/nondistended/normal bowel sounds. Ext: no edema, 1+ DP and PT pulses Skin: warm, dry Neuro: grossly normal, moves all extremities  Assessment/Plan:  Hypertension S: controlled. On amlodipine 5mg , hctz 25mg , losartan 100mg   BP Readings from Last 3 Encounters:  10/09/15 106/66  09/03/15 163/58  07/10/15 143/83  A/P:Continue current meds:  Continue current medication  Hyperlipidemia S: well controlled on simvastatin 20mg . No myalgias.  Lab Results  Component Value Date   CHOL 117 03/11/2015   HDL 31.20* 03/11/2015   LDLCALC 61 03/11/2015   LDLDIRECT 64.0 07/31/2014   TRIG 126.0 03/11/2015   CHOLHDL 4 03/11/2015   A/P: continue current prescription  He is at 1 ppd. Advised smoking cessation- declines.   Stool cards- advised him to send into GI (states he just did this- wonder if  just not processed)  IRon deficiency anemia- send in iron to be taken twice a day. Tends to be on looser side of stools so doubt will get constipated.   Diabetes mellitus, type 2 ([place]) S: suspect mild poor control. On metformin 1000mg  BID, lantus 38 units CBGs- 80s- 110s in AM. Sometimes in 190s in afternoon.  Lab Results  Component Value Date   HGBA1C 7.9* 03/11/2015   HGBA1C 8.0* 11/07/2014   HGBA1C 7.5* 07/31/2014   A/P: update a1c today, perhaps add amaryl if above 7.5 given daytime highs.   Claudication [facility]) S: Mentions pain in calves with walking, resolves  with stopping. Pulses noted 1+ in past A/P: we will get ABIs- given smoker and DM, HTN, HLD- high suspicion for PVD    Return in about 4 months (around 02/08/2016).  Orders Placed This Encounter  Procedures  . CBC    [place]  . Comprehensive metabolic panel    [place]  . Hemoglobin A1c    [place]  . LDL cholesterol, direct    [place]    Meds ordered this encounter  Medications  . ferrous sulfate 325 (65 FE) MG tablet    Sig: Take 1 tablet (325 mg total) by mouth 2 (two) times daily with a meal.    Dispense:  60 tablet    Refill:  11    Return precautions advised.  Garret Reddish, MD

## 2015-10-09 NOTE — Assessment & Plan Note (Signed)
S: Mentions pain in calves with walking, resolves with stopping. Pulses noted 1+ in past A/P: we will get ABIs- given smoker and DM, HTN, HLD- high suspicion for PVD

## 2015-10-09 NOTE — Patient Instructions (Addendum)
Labs before you leave  No changes to medications though I may add one diabetes medicine to help with daytime blood sugars. It can make your sugar go low so if we start it make sure to read up on information below  Start iron twice a day (with breakfast and dinner)  We will call you within a week about your referral for testing of the bloodflow in your legs (given pain with walking). If you do not hear within 2 weeks, give [place] a call.     Hypoglycemia Low blood sugar (hypoglycemia) means that the level of sugar in your blood is lower than it should be. Signs of low blood sugar include:  Getting sweaty.  Feeling hungry.  Feeling dizzy or weak.  Feeling sleepier than normal.  Feeling nervous.  Headaches.  Having a fast heartbeat. Low blood sugar can happen fast and can be an emergency. Your doctor can do tests to check your blood sugar level. You can have low blood sugar and not have diabetes. HOME CARE  Check your blood sugar as told by your doctor. If it is less than 70 mg/dl or as told by your doctor, take 1 of the following:  3 to 4 glucose tablets.   cup clear juice.   cup soda pop, not diet.  1 cup milk.  5 to 6 hard candies.  Recheck blood sugar after 15 minutes. Repeat until it is at the right level.  Eat a snack if it is more than 1 hour until the next meal.  Only take medicine as told by your doctor.  Do not skip meals. Eat on time.  Do not drink alcohol except with meals.  Check your blood glucose before driving.  Check your blood glucose before and after exercise.  Always carry treatment with you, such as glucose pills.  Always wear a medical alert bracelet if you have diabetes. GET HELP RIGHT AWAY IF:   Your blood glucose goes below 70 mg/dl or as told by your doctor, and you:  Are confused.  Are not able to swallow.  Pass out (faint).  You cannot treat yourself. You may need someone to help you.  You have low blood sugar problems  often.  You have problems from your medicines.  You are not feeling better after 3 to 4 days.  You have vision changes. MAKE SURE YOU:   Understand these instructions.  Will watch this condition.  Will get help right away if you are not doing well or get worse.   This information is not intended to replace advice given to you by your health care provider. Make sure you discuss any questions you have with your health care provider.   Document Released: 07/01/2009 Document Revised: 04/27/2014 Document Reviewed: 12/11/2014 Elsevier Interactive Patient Education [employer].

## 2015-10-09 NOTE — Assessment & Plan Note (Signed)
S: suspect mild poor control. On metformin 1000mg  BID, lantus 38 units CBGs- 80s- 110s in AM. Sometimes in 190s in afternoon.  Lab Results  Component Value Date   HGBA1C 7.9* 03/11/2015   HGBA1C 8.0* 11/07/2014   HGBA1C 7.5* 07/31/2014   A/P: update a1c today, perhaps add amaryl if above 7.5 given daytime highs.

## 2015-10-09 NOTE — Progress Notes (Signed)
Pre visit review using our clinic review tool, if applicable. No additional management support is needed unless otherwise documented below in the visit note. 

## 2015-10-10 ENCOUNTER — Other Ambulatory Visit: Payer: Self-pay | Admitting: Family Medicine

## 2015-10-10 MED ORDER — GLIMEPIRIDE 2 MG PO TABS: 2.0000 mg | ORAL_TABLET | Freq: Every day | ORAL | Status: DC

## 2015-10-11 ENCOUNTER — Other Ambulatory Visit (INDEPENDENT_AMBULATORY_CARE_PROVIDER_SITE_OTHER): Payer: Medicare Other

## 2015-10-11 DIAGNOSIS — D509 Iron deficiency anemia, unspecified: Secondary | ICD-10-CM

## 2015-10-12 ENCOUNTER — Other Ambulatory Visit: Payer: Self-pay | Admitting: Family Medicine

## 2015-10-14 LAB — HEMOCCULT SLIDES (X 3 CARDS)
FECAL OCCULT BLD: NEGATIVE
OCCULT 3: NEGATIVE
OCCULT 4: NEGATIVE
OCCULT 5: NEGATIVE

## 2015-12-09 ENCOUNTER — Other Ambulatory Visit: Payer: Self-pay | Admitting: Family Medicine

## 2015-12-11 ENCOUNTER — Other Ambulatory Visit: Payer: Self-pay | Admitting: Family Medicine

## 2016-01-11 ENCOUNTER — Other Ambulatory Visit: Payer: Self-pay | Admitting: Family Medicine

## 2016-02-06 ENCOUNTER — Encounter (INDEPENDENT_AMBULATORY_CARE_PROVIDER_SITE_OTHER): Payer: Self-pay

## 2016-02-06 ENCOUNTER — Ambulatory Visit (INDEPENDENT_AMBULATORY_CARE_PROVIDER_SITE_OTHER): Payer: Medicare Other | Admitting: Family Medicine

## 2016-02-06 ENCOUNTER — Encounter: Payer: Self-pay | Admitting: Family Medicine

## 2016-02-06 DIAGNOSIS — Z23 Encounter for immunization: Secondary | ICD-10-CM | POA: Diagnosis not present

## 2016-02-06 DIAGNOSIS — E785 Hyperlipidemia, unspecified: Secondary | ICD-10-CM

## 2016-02-06 DIAGNOSIS — I10 Essential (primary) hypertension: Secondary | ICD-10-CM | POA: Diagnosis not present

## 2016-02-06 DIAGNOSIS — E1142 Type 2 diabetes mellitus with diabetic polyneuropathy: Secondary | ICD-10-CM | POA: Diagnosis not present

## 2016-02-06 DIAGNOSIS — D509 Iron deficiency anemia, unspecified: Secondary | ICD-10-CM

## 2016-02-06 DIAGNOSIS — Z794 Long term (current) use of insulin: Secondary | ICD-10-CM

## 2016-02-06 LAB — CBC
HCT: 35.1 % — ABNORMAL LOW (ref 39.0–52.0)
Hemoglobin: 11.4 g/dL — ABNORMAL LOW (ref 13.0–17.0)
MCHC: 32.4 g/dL (ref 30.0–36.0)
MCV: 80.4 fl (ref 78.0–100.0)
Platelets: 289 10*3/uL (ref 150.0–400.0)
RBC: 4.36 Mil/uL (ref 4.22–5.81)
RDW: 15.7 % — ABNORMAL HIGH (ref 11.5–15.5)
WBC: 10.5 10*3/uL (ref 4.0–10.5)

## 2016-02-06 LAB — COMPREHENSIVE METABOLIC PANEL
ALK PHOS: 123 U/L — AB (ref 39–117)
ALT: 15 U/L (ref 0–53)
AST: 15 U/L (ref 0–37)
Albumin: 3.7 g/dL (ref 3.5–5.2)
BUN: 23 mg/dL (ref 6–23)
CO2: 30 mEq/L (ref 19–32)
Calcium: 9.7 mg/dL (ref 8.4–10.5)
Chloride: 100 mEq/L (ref 96–112)
Creatinine, Ser: 1.31 mg/dL (ref 0.40–1.50)
GFR: 59.87 mL/min — AB (ref >60.00)
GLUCOSE: 88 mg/dL (ref 70–99)
POTASSIUM: 4.4 meq/L (ref 3.5–5.1)
Sodium: 141 mEq/L (ref 135–145)
TOTAL PROTEIN: 7.2 g/dL (ref 6.0–8.3)
Total Bilirubin: 0.3 mg/dL (ref 0.2–1.2)

## 2016-02-06 LAB — HEMOGLOBIN A1C: HEMOGLOBIN A1C: 8.1 % — AB (ref 4.6–6.5)

## 2016-02-06 LAB — FERRITIN: Ferritin: 9.6 ng/mL — ABNORMAL LOW (ref 22.0–322.0)

## 2016-02-06 MED ORDER — GLIMEPIRIDE 2 MG PO TABS: 2.0000 mg | ORAL_TABLET | Freq: Every day | ORAL | 3 refills | Status: DC

## 2016-02-06 MED ORDER — GLUCOSE BLOOD VI STRP: ORAL_STRIP | 3 refills | Status: DC

## 2016-02-06 MED ORDER — "INSULIN SYRINGE-NEEDLE U-100 31G X 5/16"" 0.5 ML MISC": 3 refills | Status: DC

## 2016-02-06 MED ORDER — LABETALOL HCL 200 MG PO TABS: 200.0000 mg | ORAL_TABLET | Freq: Two times a day (BID) | ORAL | 3 refills | Status: DC

## 2016-02-06 MED ORDER — METFORMIN HCL 1000 MG PO TABS: 1000.0000 mg | ORAL_TABLET | Freq: Two times a day (BID) | ORAL | 3 refills | Status: DC

## 2016-02-06 MED ORDER — INSULIN GLARGINE 100 UNIT/ML ~~LOC~~ SOLN: SUBCUTANEOUS | 3 refills | Status: DC

## 2016-02-06 MED ORDER — LOSARTAN POTASSIUM 100 MG PO TABS: 100.0000 mg | ORAL_TABLET | Freq: Every day | ORAL | 3 refills | Status: DC

## 2016-02-06 MED ORDER — AMLODIPINE BESYLATE 5 MG PO TABS: 5.0000 mg | ORAL_TABLET | Freq: Every day | ORAL | 3 refills | Status: DC

## 2016-02-06 MED ORDER — HYDROCHLOROTHIAZIDE 25 MG PO TABS: 25.0000 mg | ORAL_TABLET | Freq: Every day | ORAL | 3 refills | Status: DC

## 2016-02-06 MED ORDER — FERROUS SULFATE 325 (65 FE) MG PO TABS: 325.0000 mg | ORAL_TABLET | Freq: Two times a day (BID) | ORAL | 3 refills | Status: DC

## 2016-02-06 NOTE — Assessment & Plan Note (Signed)
S: controlled on amlodipine 5mg , hctz 25 mg, losartan 100mg , labetalol 200mg  BID BP Readings from Last 3 Encounters:  02/06/16 132/82  10/09/15 106/66  09/03/15 (!) 163/58  A/P:Continue current meds: doing well on repeat. Family friend may look i

## 2016-02-06 NOTE — Progress Notes (Signed)
Subjective:  William [name] is a 55 y.o. year old very pleasant male patient who presents for/with See problem oriented charting ROS- rare hypoglycemia. No chest pain or shortness of breath. No headache or blurry vision. .see any ROS included in HPI as well.   Past Medical History-  Patient Active Problem List   Diagnosis Date Noted  . Iron deficiency anemia 06/25/2015    Priority: High  . Tobacco use disorder 11/21/2013    Priority: High  . Diabetes mellitus, type 2 ([place]) 09/28/2006    Priority: High  . History of cardiovascular disorder 09/28/2006    Priority: High  . CKD (chronic kidney disease), stage III 04/30/2014    Priority: Medium  . Hyperlipidemia 09/28/2006    Priority: Medium  . Essential hypertension 09/28/2006    Priority: Medium  . Claudication ([place]) 10/09/2015  . GERD (gastroesophageal reflux disease) 09/25/2013  . ONYCHOMYCOSIS, TOENAILS 03/22/2007    Medications- reviewed and updated Current Outpatient Prescriptions  Medication Sig Dispense Refill  . amLODipine (NORVASC) 5 MG tablet Take 1 tablet (5 mg total) by mouth daily. 100 tablet 3  . aspirin 81 MG tablet Take 81 mg by mouth daily.      . ferrous sulfate 325 (65 FE) MG tablet Take 1 tablet (325 mg total) by mouth 2 (two) times daily with a meal. 200 tablet 3  . glimepiride (AMARYL) 2 MG tablet Take 1 tablet (2 mg total) by mouth daily before breakfast. 100 tablet 3  . glucose blood (ONE TOUCH ULTRA TEST) test strip CHECK BLOOD SUGAR 3 TIMES DAILY 100 each 3  . hydrochlorothiazide (HYDRODIURIL) 25 MG tablet Take 1 tablet (25 mg total) by mouth daily. 100 tablet 3  . insulin glargine (LANTUS) 100 UNIT/ML injection INJECT 40 UNITS INTO THE SKIN EACH MORNING AS LONG AS SUGAR ABOVE 90. 40 mL 3  . Insulin Syringe-Needle U-100 (B-D INS SYRINGE 0.5CC/31GX5/16) 31G X 5/16" 0.5 ML MISC Use to test blood sugars daily. Dx: E11.9 100 each 3  . labetalol (NORMODYNE) 200 MG tablet Take 1 tablet (200 mg total) by mouth  2 (two) times daily. 200 tablet 3  . losartan (COZAAR) 100 MG tablet Take 1 tablet (100 mg total) by mouth daily. 100 tablet 3  . metFORMIN (GLUCOPHAGE) 1000 MG tablet Take 1 tablet (1,000 mg total) by mouth 2 (two) times daily with a meal. 200 tablet 3  . omeprazole (PRILOSEC) 20 MG capsule Take 20 mg by mouth daily.       No current facility-administered medications for this visit.     Objective: BP 132/82   Pulse 67   Temp 97.9 F (36.6 C) (Oral)   Wt 172 lb 9.6 oz (78.3 kg)   SpO2 99%   BMI 27.03 kg/m  Gen: NAD, resting comfortably CV: RRR no murmurs rubs or gallops Lungs: CTAB no crackles, wheeze, rhonchi Abdomen: soft/nontender/nondistended/normal bowel sounds. overweight Ext: no edema Skin: warm, dry  Assessment/Plan:  Diabetes mellitus, type 2 (H[facility]) S: poorly controlled. On 1000mg  BID, lantus 38 units last visit. Added amaryl 2mg  but only once or twice a week. Gets nervous If sugar below 100 about taking CBGs-  A lot of ups and downs. 2x got just below 70. But he quickly treated this  Lab Results  Component Value Date   HGBA1C 8.1 (H) 10/09/2015   HGBA1C 7.9 (H) 03/11/2015   HGBA1C 8.0 (H) 11/07/2014   A/P: update a1c, plan is 3 meals a day, have snack on hand start amaryl  regularly if a1c is not under 7.5.   Essential hypertension S: controlled on amlodipine 5mg , hctz 25 mg, losartan 100mg , labetalol 200mg  BID BP Readings from Last 3 Encounters:  02/06/16 132/82  10/09/15 106/66  09/03/15 (!) 163/58  A/P:Continue current meds: doing well on repeat. Family friend may look i  Hyperlipidemia S: mild poorly controlled on simvastatin 20mg  with LDL 80 on last check. No myalgias.  Lab Results  Component Value Date   CHOL 117 03/11/2015   HDL 31.20 (L) 03/11/2015   LDLCALC 61 03/11/2015   LDLDIRECT 80.0 10/09/2015   TRIG 126.0 03/11/2015   CHOLHDL 4 03/11/2015   A/P: change to atorvastatin 20mg .    Iron deficiency anemia S: anemia persists- not sure if he  has done capsule study- needs to return to GI for this. Only doing iron once a day A/P: update labs including ferritin today as well as cbc. Advised iron twice a day.    3.5 month follow up  Orders Placed This Encounter  Procedures  . Flu Vaccine QUAD 36+ mos IM  . CBC    [place]  . Ferritin  . Comprehensive metabolic panel    [place]  . Hemoglobin A1c    [place]    Meds ordered this encounter  Medications  . Insulin Syringe-Needle U-100 (B-D INS SYRINGE 0.5CC/31GX5/16) 31G X 5/16" 0.5 ML MISC    Sig: Use to test blood sugars daily. Dx: E11.9    Dispense:  100 each    Refill:  3  . insulin glargine (LANTUS) 100 UNIT/ML injection    Sig: INJECT 40 UNITS INTO THE SKIN EACH MORNING AS LONG AS SUGAR ABOVE 90.    Dispense:  40 mL    Refill:  3  . glucose blood (ONE TOUCH ULTRA TEST) test strip    Sig: CHECK BLOOD SUGAR 3 TIMES DAILY    Dispense:  100 each    Refill:  3    E11.9  . amLODipine (NORVASC) 5 MG tablet    Sig: Take 1 tablet (5 mg total) by mouth daily.    Dispense:  100 tablet    Refill:  3  . ferrous sulfate 325 (65 FE) MG tablet    Sig: Take 1 tablet (325 mg total) by mouth 2 (two) times daily with a meal.    Dispense:  200 tablet    Refill:  3  . glimepiride (AMARYL) 2 MG tablet    Sig: Take 1 tablet (2 mg total) by mouth daily before breakfast.    Dispense:  100 tablet    Refill:  3  . hydrochlorothiazide (HYDRODIURIL) 25 MG tablet    Sig: Take 1 tablet (25 mg total) by mouth daily.    Dispense:  100 tablet    Refill:  3  . labetalol (NORMODYNE) 200 MG tablet    Sig: Take 1 tablet (200 mg total) by mouth 2 (two) times daily.    Dispense:  200 tablet    Refill:  3  . losartan (COZAAR) 100 MG tablet    Sig: Take 1 tablet (100 mg total) by mouth daily.    Dispense:  100 tablet    Refill:  3  . metFORMIN (GLUCOPHAGE) 1000 MG tablet    Sig: Take 1 tablet (1,000 mg total) by mouth 2 (two) times daily with a meal.    Dispense:  200 tablet    Refill:  3     Return precautions advised.  Garret Reddish, MD

## 2016-02-06 NOTE — Assessment & Plan Note (Signed)
S: mild poorly controlled on simvastatin 20mg  with LDL 80 on last check. No myalgias.  Lab Results  Component Value Date   CHOL 117 03/11/2015   HDL 31.20 (L) 03/11/2015   LDLCALC 61 03/11/2015   LDLDIRECT 80.0 10/09/2015   TRIG 126.0 03/11/2015   CHOLHDL 4 03/11/2015   A/P: change to atorvastatin 20mg .

## 2016-02-06 NOTE — Assessment & Plan Note (Signed)
S: poorly controlled. On 1000mg  BID, lantus 38 units last visit. Added amaryl 2mg  but only once or twice a week. Gets nervous If sugar below 100 about taking CBGs-  A lot of ups and downs. 2x got just below 70. But he quickly treated this  Lab Results  Component Value Date   HGBA1C 8.1 (H) 10/09/2015   HGBA1C 7.9 (H) 03/11/2015   HGBA1C 8.0 (H) 11/07/2014   A/P: update a1c, plan is 3 meals a day, have snack on hand start amaryl regularly if a1c is not under 7.5.

## 2016-02-06 NOTE — Patient Instructions (Addendum)
update a1c, plan is 3 meals a day, have snack on hand.  start amaryl regularly if a1c is not under 7.5. If a1c is under 7.5, you can continue to just take if sugar is elevated in morning.   Advised to quit smoking  Did you follow up with GI for pill study? If not- call them back and get set up  90 days of meds sent in per request  Labs before you leave  See me in 3.5 months

## 2016-02-06 NOTE — Progress Notes (Signed)
Pre visit review using our clinic review tool, if applicable. No additional management support is needed unless otherwise documented below in the visit note. 

## 2016-02-06 NOTE — Assessment & Plan Note (Signed)
S: anemia persists- not sure if he has done capsule study- needs to return to GI for this. Only doing iron once a day A/P: update labs including ferritin today as well as cbc. Advised iron twice a day.

## 2016-02-07 ENCOUNTER — Other Ambulatory Visit: Payer: Self-pay

## 2016-02-07 MED ORDER — ACCU-CHEK SOFT TOUCH LANCETS MISC: 12 refills | Status: DC

## 2016-02-07 MED ORDER — ACCU-CHEK AVIVA PLUS W/DEVICE KIT: PACK | 0 refills | Status: DC

## 2016-02-07 MED ORDER — GLUCOSE BLOOD VI STRP: ORAL_STRIP | 12 refills | Status: DC

## 2016-02-11 ENCOUNTER — Encounter: Payer: Self-pay | Admitting: Family Medicine

## 2016-03-11 ENCOUNTER — Other Ambulatory Visit: Payer: Self-pay | Admitting: Family Medicine

## 2016-04-28 ENCOUNTER — Encounter: Payer: Self-pay | Admitting: Family Medicine

## 2016-05-04 MED ORDER — METFORMIN HCL 1000 MG PO TABS
1000.0000 mg | ORAL_TABLET | Freq: Two times a day (BID) | ORAL | 3 refills | Status: DC
Start: 2016-05-04 — End: 2017-01-08

## 2016-05-04 MED ORDER — SIMVASTATIN 20 MG PO TABS: 20.0000 mg | ORAL_TABLET | Freq: Every day | ORAL | 3 refills | Status: DC

## 2016-05-04 MED ORDER — LOSARTAN POTASSIUM-HCTZ 100-25 MG PO TABS: 1.0000 | ORAL_TABLET | Freq: Every day | ORAL | 3 refills | Status: DC

## 2016-05-04 MED ORDER — LABETALOL HCL 200 MG PO TABS: 200.0000 mg | ORAL_TABLET | Freq: Two times a day (BID) | ORAL | 3 refills | Status: DC

## 2016-05-04 MED ORDER — OMEPRAZOLE 20 MG PO CPDR: 20.0000 mg | DELAYED_RELEASE_CAPSULE | Freq: Every day | ORAL | 3 refills | Status: DC

## 2016-05-04 MED ORDER — GLIMEPIRIDE 2 MG PO TABS: 2.0000 mg | ORAL_TABLET | Freq: Every day | ORAL | 3 refills | Status: DC

## 2016-05-04 NOTE — Telephone Encounter (Signed)
Can stop losartan 100 and hctz 25 then call in new rx for losartan-hctz 100-25mg .

## 2016-05-04 NOTE — Telephone Encounter (Signed)
Dr. Yong Channel - Pt is requesting new rx for Omeprazole as well as to see if there is one of his medications that can be combined with HCTZ so that he has one less pill to take.  Please advise. Thanks!

## 2016-05-04 NOTE — Telephone Encounter (Signed)
Otho Perl, (caregiver) following up on request for refills. Pt is going to be out soon. This was sent in 04/28/2016.  [facility] and the rest is in her note.  Please call Otho Perl when this is done. [phone]

## 2016-05-08 ENCOUNTER — Encounter: Payer: Self-pay | Admitting: Family Medicine

## 2016-05-08 ENCOUNTER — Other Ambulatory Visit: Payer: Self-pay

## 2016-05-08 MED ORDER — AMLODIPINE BESYLATE 5 MG PO TABS: 5.0000 mg | ORAL_TABLET | Freq: Every day | ORAL | 3 refills | Status: DC

## 2016-05-29 ENCOUNTER — Other Ambulatory Visit: Payer: Self-pay | Admitting: Family Medicine

## 2016-05-29 NOTE — Telephone Encounter (Signed)
Tried calling patient. I am pretty sure he switched last year because of insurance so I need to verify if his insurance switched again this year

## 2016-06-09 ENCOUNTER — Encounter: Payer: Self-pay | Admitting: Family Medicine

## 2016-06-09 ENCOUNTER — Ambulatory Visit (INDEPENDENT_AMBULATORY_CARE_PROVIDER_SITE_OTHER): Payer: Medicare Other | Admitting: Family Medicine

## 2016-06-09 VITALS — BP 102/72 | HR 64 | Temp 97.5°F | Ht 67.0 in | Wt 177.4 lb

## 2016-06-09 DIAGNOSIS — Z794 Long term (current) use of insulin: Secondary | ICD-10-CM | POA: Diagnosis not present

## 2016-06-09 DIAGNOSIS — I10 Essential (primary) hypertension: Secondary | ICD-10-CM

## 2016-06-09 DIAGNOSIS — E1142 Type 2 diabetes mellitus with diabetic polyneuropathy: Secondary | ICD-10-CM

## 2016-06-09 DIAGNOSIS — I739 Peripheral vascular disease, unspecified: Secondary | ICD-10-CM

## 2016-06-09 DIAGNOSIS — D509 Iron deficiency anemia, unspecified: Secondary | ICD-10-CM | POA: Diagnosis not present

## 2016-06-09 DIAGNOSIS — E785 Hyperlipidemia, unspecified: Secondary | ICD-10-CM

## 2016-06-09 DIAGNOSIS — F17200 Nicotine dependence, unspecified, uncomplicated: Secondary | ICD-10-CM

## 2016-06-09 LAB — COMPREHENSIVE METABOLIC PANEL
ALT: 12 U/L (ref 0–53)
AST: 13 U/L (ref 0–37)
Albumin: 3.9 g/dL (ref 3.5–5.2)
Alkaline Phosphatase: 95 U/L (ref 39–117)
BILIRUBIN TOTAL: 0.3 mg/dL (ref 0.2–1.2)
BUN: 21 mg/dL (ref 6–23)
CO2: 32 mEq/L (ref 19–32)
CREATININE: 1.34 mg/dL (ref 0.40–1.50)
Calcium: 9.5 mg/dL (ref 8.4–10.5)
Chloride: 101 mEq/L (ref 96–112)
GFR: 58.26 mL/min — ABNORMAL LOW (ref >60.00)
GLUCOSE: 52 mg/dL — AB (ref 70–99)
Potassium: 4.1 mEq/L (ref 3.5–5.1)
Sodium: 141 mEq/L (ref 135–145)
Total Protein: 7.1 g/dL (ref 6.0–8.3)

## 2016-06-09 LAB — HEMOGLOBIN A1C: Hgb A1c MFr Bld: 7.9 % — ABNORMAL HIGH (ref 4.6–6.5)

## 2016-06-09 LAB — CBC
HCT: 42.4 % (ref 39.0–52.0)
Hemoglobin: 14 g/dL (ref 13.0–17.0)
MCHC: 33 g/dL (ref 30.0–36.0)
MCV: 88.5 fl (ref 78.0–100.0)
PLATELETS: 284 10*3/uL (ref 150.0–400.0)
RBC: 4.79 Mil/uL (ref 4.22–5.81)
RDW: 14.7 % (ref 11.5–15.5)
WBC: 9.9 10*3/uL (ref 4.0–10.5)

## 2016-06-09 LAB — LDL CHOLESTEROL, DIRECT: Direct LDL: 88 mg/dL

## 2016-06-09 LAB — FERRITIN: Ferritin: 24.5 ng/mL (ref 22.0–322.0)

## 2016-06-09 NOTE — Assessment & Plan Note (Addendum)
Pack a day. Not ready to quit thought was encouraged. Over 30 pack years- talked about lung cancer screening but declines

## 2016-06-09 NOTE — Assessment & Plan Note (Addendum)
S: poorly  Controlled on last check On metformin 1000mg  BID, amaryl 2mg  (but had some sugars under 100 so would only take once or twice a week) lantus 38 units. Last visit we focused on eating 3 meals a day , snack on hand and using amaryl regularly. States taking 37-38 units of lantus.  CBGs- this morning got 97, denies lows Lab Results  Component Value Date   HGBA1C 8.1 (H) 02/06/2016   HGBA1C 8.1 (H) 10/09/2015   HGBA1C 7.9 (H) 03/11/2015   A/P: update a1c, with target below 8 given low baseline iq  Eye exam scheduled next month fortunately.

## 2016-06-09 NOTE — Progress Notes (Signed)
Subjective:  William [name] is a 56 y.o. year old very pleasant male patient who presents for/with See problem oriented charting ROS- occasional cramping in calves that stops when stops walking. No chest pain or shortness of breath. No fever or cough   Past Medical History-  Patient Active Problem List   Diagnosis Date Noted  . Iron deficiency anemia 06/25/2015    Priority: High  . Tobacco use disorder 11/21/2013    Priority: High  . Diabetes mellitus, type 2 ([place]) 09/28/2006    Priority: High  . History of cardiovascular disorder 09/28/2006    Priority: High  . CKD (chronic kidney disease), stage III 04/30/2014    Priority: Medium  . Hyperlipidemia 09/28/2006    Priority: Medium  . Essential hypertension 09/28/2006    Priority: Medium  . Claudication ([place]) 10/09/2015  . GERD (gastroesophageal reflux disease) 09/25/2013  . ONYCHOMYCOSIS, TOENAILS 03/22/2007    Medications- reviewed and updated Current Outpatient Prescriptions  Medication Sig Dispense Refill  . amLODipine (NORVASC) 5 MG tablet Take 1 tablet (5 mg total) by mouth daily. 100 tablet 3  . aspirin 81 MG tablet Take 81 mg by mouth daily.      . Blood Glucose Monitoring Suppl (ACCU-CHEK AVIVA PLUS) w/Device KIT Use device to check blood sugar three times a day 1 kit 0  . ferrous sulfate 325 (65 FE) MG tablet Take 1 tablet (325 mg total) by mouth 2 (two) times daily with a meal. 200 tablet 3  . glimepiride (AMARYL) 2 MG tablet Take 1 tablet (2 mg total) by mouth daily before breakfast. 90 tablet 3  . glucose blood (ACCU-CHEK AVIVA) test strip Use to check blood sugar three times a day E11.9 100 each 12  . insulin glargine (LANTUS) 100 UNIT/ML injection INJECT 40 UNITS INTO THE SKIN EACH MORNING AS LONG AS SUGAR ABOVE 90. 40 mL 3  . Insulin Syringe-Needle U-100 (B-D INS SYRINGE 0.5CC/31GX5/16) 31G X 5/16" 0.5 ML MISC Use to test blood sugars daily. Dx: E11.9 100 each 3  . labetalol (NORMODYNE) 200 MG tablet Take 1  tablet (200 mg total) by mouth 2 (two) times daily. 180 tablet 3  . Lancets (ACCU-CHEK SOFT TOUCH) lancets Use as instructed 100 each 12  . losartan-hydrochlorothiazide (HYZAAR) 100-25 MG tablet Take 1 tablet by mouth daily. 90 tablet 3  . metFORMIN (GLUCOPHAGE) 1000 MG tablet Take 1 tablet (1,000 mg total) by mouth 2 (two) times daily with a meal. 180 tablet 3  . omeprazole (PRILOSEC) 20 MG capsule Take 1 capsule (20 mg total) by mouth daily. 90 capsule 3  . ONE TOUCH ULTRA TEST test strip CHECK BLOOD SUGAR 3 TIMES DAILY 100 each 3  . simvastatin (ZOCOR) 20 MG tablet Take 1 tablet (20 mg total) by mouth at bedtime. 90 tablet 3   No current facility-administered medications for this visit.     Objective: BP 102/72 (BP Location: Left Arm, Patient Position: Sitting, Cuff Size: Large)   Pulse 64   Temp 97.5 F (36.4 C) (Oral)   Ht '5\' 7"'  (1.702 m)   Wt 177 lb 6.4 oz (80.5 kg)   SpO2 96%   BMI 27.78 kg/m  Gen: NAD, resting comfortably CV: RRR no murmurs rubs or gallops Lungs: CTAB no crackles, wheeze, rhonchi Abdomen: soft/nontender/nondistended/normal bowel sounds. No rebound or guarding.  Ext: no edema Skin: warm, dry, no rash  Diabetic Foot Exam - Simple   Simple Foot Form Diabetic Foot exam was performed with the following findings:  Yes 06/09/2016  9:58 AM  Visual Inspection No deformities, no ulcerations, no other skin breakdown bilaterally:  Yes Sensation Testing Intact to touch and monofilament testing bilaterally:  Yes Pulse Check Posterior Tibialis and Dorsalis pulse intact bilaterally:  Yes Comments Some likely onychomycosis. Pulses 1+ bilaterally DP and PT.      Assessment/Plan:  Diabetes mellitus, type 2 ([place]) S: poorly  Controlled on last check On metformin 1023m BID, amaryl 226m(but had some sugars under 100 so would only take once or twice a week) lantus 38 units. Last visit we focused on eating 3 meals a day , snack on hand and using amaryl regularly. States  taking 37-38 units of lantus.  CBGs- this morning got 97, denies lows Lab Results  Component Value Date   HGBA1C 8.1 (H) 02/06/2016   HGBA1C 8.1 (H) 10/09/2015   HGBA1C 7.9 (H) 03/11/2015   A/P: update a1c, with target below 8 given low baseline iq  Eye exam scheduled next month fortunately.    Hyperlipidemia S: suspect poor simvastatin 2010mNo myalgias.  Lab Results  Component Value Date   CHOL 117 03/11/2015   HDL 31.20 (L) 03/11/2015   LDLCALC 61 03/11/2015   LDLDIRECT 80.0 10/09/2015   TRIG 126.0 03/11/2015   CHOLHDL 4 03/11/2015   A/P: update LDL today, had intended to change to atorvastatin last visit at 6m45mt appears this was not done- will update lipids and decide if need to change- would do so if LDL over 70  Iron deficiency anemia Iron twice a day finally compliant- needs to return to GI for capsule studay- encouraged this.   Tobacco use disorder Pack a day. Not ready to quit thought was encouraged. Over 30 pack years- talked about lung cancer screening but declines  Hypertension S: controlled on losartan 100mg38mbetalol 200mg 62m hctz 25mg, 51mdipine 5mg.  B67meadings from Last 3 Encounters:  06/09/16 102/72  02/06/16 132/82  10/09/15 106/66  A/P:Continue current meds:  BP looks great today  No Follow-up on file.  Orders Placed This Encounter  Procedures  . Hemoglobin A1c    [place]  . CBC    [place]  . Comprehensive metabolic panel    [place]  . LDL cholesterol, direct    [place]  . Ferritin   Return precautions advised.  Eshani Maestre Garret Reddish

## 2016-06-09 NOTE — Progress Notes (Signed)
Pre visit review using our clinic review tool, if applicable. No additional management support is needed unless otherwise documented below in the visit note. 

## 2016-06-09 NOTE — Patient Instructions (Addendum)
Please stop by lab before you go  No changes planned unless bad cholesterol above 80 would change simvastatin to atorvastatin  Make sure to call the stomach doctors to complete your workup for anemia  So thrilled you have an eye exam scheduled! Great job  You will get a call about a study to make sure good blood flow in the leg- asked them to call [name].  ______________________________________________________________________  Starting October 1st 2018, I will be transferring to our new location: [facility] (corner of [address] [employer]) [place], [place] Phone: [phone]  I would love to have you remain my patient at this new location as long as it remains convenient for you. I am excited about the opportunity to have x-ray and sports medicine in the new building but will really miss the awesome staff and physicians at [facility]. Continue to schedule appointments at [facility] and we will automatically transfer them to the [place] location starting October 1st.

## 2016-06-09 NOTE — Assessment & Plan Note (Signed)
Pain in calves with walking at times resolves with stopping- does not always notice. Over 30 years smoking history, hyperlipidemia, diabetes.   Will get ABIs with 1+ pulses

## 2016-06-09 NOTE — Assessment & Plan Note (Signed)
Iron twice a day finally compliant- needs to return to GI for capsule studay- encouraged this.

## 2016-06-09 NOTE — Assessment & Plan Note (Signed)
S: suspect poor simvastatin 20mg . No myalgias.  Lab Results  Component Value Date   CHOL 117 03/11/2015   HDL 31.20 (L) 03/11/2015   LDLCALC 61 03/11/2015   LDLDIRECT 80.0 10/09/2015   TRIG 126.0 03/11/2015   CHOLHDL 4 03/11/2015   A/P: update LDL today, had intended to change to atorvastatin last visit at 20mg  but appears this was not done- will update lipids and decide if need to change- would do so if LDL over 70

## 2016-06-10 ENCOUNTER — Other Ambulatory Visit: Payer: Self-pay

## 2016-06-10 MED ORDER — GLUCOSE BLOOD VI STRP: ORAL_STRIP | 3 refills | Status: DC

## 2016-06-10 MED ORDER — ATORVASTATIN CALCIUM 20 MG PO TABS: 20.0000 mg | ORAL_TABLET | Freq: Every day | ORAL | 3 refills | Status: DC

## 2016-06-11 ENCOUNTER — Other Ambulatory Visit: Payer: Self-pay | Admitting: Family Medicine

## 2016-06-11 ENCOUNTER — Ambulatory Visit: Payer: Medicare Other | Admitting: Family Medicine

## 2016-06-16 ENCOUNTER — Telehealth: Payer: Self-pay | Admitting: Family Medicine

## 2016-06-16 NOTE — Telephone Encounter (Signed)
Pt states he dropped his bottle of  insulin glargine (LANTUS) 100 UNIT/ML injection  And it broke. Would like if we have any samples he could have.   [id number] - [place], [place] - [address]

## 2016-06-17 NOTE — Telephone Encounter (Signed)
Per Dr Yong Channel, no samples.  Pt aware no samples of this insulin and nothing to substitute. Pt instructed to call the pharmacy and let them know what happened. Pt verbalized understanding.

## 2016-06-22 ENCOUNTER — Other Ambulatory Visit: Payer: Self-pay | Admitting: Family Medicine

## 2016-06-22 DIAGNOSIS — I739 Peripheral vascular disease, unspecified: Secondary | ICD-10-CM

## 2016-06-25 ENCOUNTER — Encounter (HOSPITAL_COMMUNITY): Payer: Self-pay

## 2016-06-25 ENCOUNTER — Ambulatory Visit (HOSPITAL_COMMUNITY)
Admission: RE | Admit: 2016-06-25 | Discharge: 2016-06-25 | Disposition: A | Discharge status: Home / Self Care | Payer: Medicare Other | Source: Ambulatory Visit | Attending: Internal Medicine | Admitting: Internal Medicine

## 2016-06-25 DIAGNOSIS — I739 Peripheral vascular disease, unspecified: Secondary | ICD-10-CM

## 2016-07-13 DIAGNOSIS — H2513 Age-related nuclear cataract, bilateral: Secondary | ICD-10-CM | POA: Diagnosis not present

## 2016-07-13 DIAGNOSIS — E083592 Diabetes mellitus due to underlying condition with proliferative diabetic retinopathy without macular edema, left eye: Secondary | ICD-10-CM | POA: Diagnosis not present

## 2016-07-13 DIAGNOSIS — E083291 Diabetes mellitus due to underlying condition with mild nonproliferative diabetic retinopathy without macular edema, right eye: Secondary | ICD-10-CM | POA: Diagnosis not present

## 2016-07-13 DIAGNOSIS — H4312 Vitreous hemorrhage, left eye: Secondary | ICD-10-CM | POA: Diagnosis not present

## 2016-07-13 LAB — HM DIABETES EYE EXAM

## 2016-07-15 ENCOUNTER — Encounter: Payer: Self-pay | Admitting: Family Medicine

## 2016-07-29 ENCOUNTER — Other Ambulatory Visit: Payer: Self-pay

## 2016-07-29 MED ORDER — GLUCOSE BLOOD VI STRP: ORAL_STRIP | 3 refills | Status: DC

## 2016-08-11 DIAGNOSIS — H4312 Vitreous hemorrhage, left eye: Secondary | ICD-10-CM | POA: Diagnosis not present

## 2016-08-11 DIAGNOSIS — H3582 Retinal ischemia: Secondary | ICD-10-CM | POA: Diagnosis not present

## 2016-08-11 DIAGNOSIS — E113593 Type 2 diabetes mellitus with proliferative diabetic retinopathy without macular edema, bilateral: Secondary | ICD-10-CM | POA: Diagnosis not present

## 2016-08-17 ENCOUNTER — Encounter: Payer: Self-pay | Admitting: Family Medicine

## 2016-08-17 DIAGNOSIS — Z794 Long term (current) use of insulin: Principal | ICD-10-CM

## 2016-08-17 DIAGNOSIS — E1142 Type 2 diabetes mellitus with diabetic polyneuropathy: Secondary | ICD-10-CM

## 2016-08-18 MED ORDER — INSULIN GLARGINE 100 UNIT/ML ~~LOC~~ SOLN: SUBCUTANEOUS | 3 refills | Status: DC

## 2016-08-18 NOTE — Telephone Encounter (Signed)
Spoke with Otho Perl ([facility]) and she states that pt has been taking 37U qhs as taking 40U in the mornings was not lasting him through the night. She reports that he would wake up with his blood sugar in the 60's. He has nearly run out of insulin in the past several months and will definitely run out this month. She states that she does not think pt was advised on changing his dosage and that he likely just changed it on his own.  Dr. Yong Channel - Please advise. Thanks!

## 2016-08-18 NOTE — Telephone Encounter (Signed)
[facility] and they have been processing old prescription that was written for 3 bottles and not the new one that was written for 4 bottles. Prescription has been corrected and resent to pharmacy. Pt notified. Nothing further needed at this time.

## 2016-08-20 DIAGNOSIS — E113593 Type 2 diabetes mellitus with proliferative diabetic retinopathy without macular edema, bilateral: Secondary | ICD-10-CM | POA: Diagnosis not present

## 2016-08-20 DIAGNOSIS — H4312 Vitreous hemorrhage, left eye: Secondary | ICD-10-CM | POA: Diagnosis not present

## 2016-08-20 DIAGNOSIS — E113592 Type 2 diabetes mellitus with proliferative diabetic retinopathy without macular edema, left eye: Secondary | ICD-10-CM | POA: Diagnosis not present

## 2016-08-21 DIAGNOSIS — E113593 Type 2 diabetes mellitus with proliferative diabetic retinopathy without macular edema, bilateral: Secondary | ICD-10-CM | POA: Diagnosis not present

## 2016-08-21 DIAGNOSIS — H4312 Vitreous hemorrhage, left eye: Secondary | ICD-10-CM | POA: Diagnosis not present

## 2016-09-01 DIAGNOSIS — H4312 Vitreous hemorrhage, left eye: Secondary | ICD-10-CM | POA: Diagnosis not present

## 2016-09-03 DIAGNOSIS — H4312 Vitreous hemorrhage, left eye: Secondary | ICD-10-CM | POA: Diagnosis not present

## 2016-09-03 DIAGNOSIS — E113592 Type 2 diabetes mellitus with proliferative diabetic retinopathy without macular edema, left eye: Secondary | ICD-10-CM | POA: Diagnosis not present

## 2016-09-04 DIAGNOSIS — E113593 Type 2 diabetes mellitus with proliferative diabetic retinopathy without macular edema, bilateral: Secondary | ICD-10-CM | POA: Diagnosis not present

## 2016-09-11 DIAGNOSIS — E113593 Type 2 diabetes mellitus with proliferative diabetic retinopathy without macular edema, bilateral: Secondary | ICD-10-CM | POA: Diagnosis not present

## 2016-10-02 DIAGNOSIS — E113593 Type 2 diabetes mellitus with proliferative diabetic retinopathy without macular edema, bilateral: Secondary | ICD-10-CM | POA: Diagnosis not present

## 2016-10-07 DIAGNOSIS — E113511 Type 2 diabetes mellitus with proliferative diabetic retinopathy with macular edema, right eye: Secondary | ICD-10-CM | POA: Diagnosis not present

## 2016-10-08 ENCOUNTER — Ambulatory Visit: Payer: Medicare Other | Admitting: Family Medicine

## 2016-10-14 ENCOUNTER — Encounter: Payer: Self-pay | Admitting: Family Medicine

## 2016-10-20 ENCOUNTER — Other Ambulatory Visit: Payer: Self-pay | Admitting: Family Medicine

## 2016-11-02 ENCOUNTER — Encounter: Payer: Self-pay | Admitting: Family Medicine

## 2016-11-03 ENCOUNTER — Encounter: Payer: Self-pay | Admitting: Family Medicine

## 2016-11-03 ENCOUNTER — Ambulatory Visit (INDEPENDENT_AMBULATORY_CARE_PROVIDER_SITE_OTHER): Payer: Medicare Other | Admitting: Family Medicine

## 2016-11-03 DIAGNOSIS — F17200 Nicotine dependence, unspecified, uncomplicated: Secondary | ICD-10-CM

## 2016-11-03 DIAGNOSIS — D509 Iron deficiency anemia, unspecified: Secondary | ICD-10-CM

## 2016-11-03 DIAGNOSIS — E785 Hyperlipidemia, unspecified: Secondary | ICD-10-CM

## 2016-11-03 DIAGNOSIS — I10 Essential (primary) hypertension: Secondary | ICD-10-CM

## 2016-11-03 DIAGNOSIS — E1142 Type 2 diabetes mellitus with diabetic polyneuropathy: Secondary | ICD-10-CM | POA: Diagnosis not present

## 2016-11-03 DIAGNOSIS — Z794 Long term (current) use of insulin: Secondary | ICD-10-CM | POA: Diagnosis not present

## 2016-11-03 DIAGNOSIS — E113593 Type 2 diabetes mellitus with proliferative diabetic retinopathy without macular edema, bilateral: Secondary | ICD-10-CM | POA: Diagnosis not present

## 2016-11-03 DIAGNOSIS — H3582 Retinal ischemia: Secondary | ICD-10-CM | POA: Diagnosis not present

## 2016-11-03 LAB — LIPID PANEL
CHOL/HDL RATIO: 4
CHOLESTEROL: 129 mg/dL (ref 0–200)
HDL: 31.7 mg/dL — ABNORMAL LOW (ref >39.00)
LDL Cholesterol: 60 mg/dL (ref 0–99)
NonHDL: 97.21
TRIGLYCERIDES: 185 mg/dL — AB (ref 0.0–149.0)
VLDL: 37 mg/dL (ref 0.0–40.0)

## 2016-11-03 LAB — COMPREHENSIVE METABOLIC PANEL
ALBUMIN: 3.7 g/dL (ref 3.5–5.2)
ALT: 11 U/L (ref 0–53)
AST: 11 U/L (ref 0–37)
Alkaline Phosphatase: 128 U/L — ABNORMAL HIGH (ref 39–117)
BILIRUBIN TOTAL: 0.4 mg/dL (ref 0.2–1.2)
BUN: 23 mg/dL (ref 6–23)
CALCIUM: 9.2 mg/dL (ref 8.4–10.5)
CO2: 32 meq/L (ref 19–32)
CREATININE: 1.37 mg/dL (ref 0.40–1.50)
Chloride: 96 mEq/L (ref 96–112)
GFR: 56.71 mL/min — ABNORMAL LOW (ref >60.00)
Glucose, Bld: 312 mg/dL — ABNORMAL HIGH (ref 70–99)
Potassium: 4.3 mEq/L (ref 3.5–5.1)
Sodium: 136 mEq/L (ref 135–145)
Total Protein: 6.7 g/dL (ref 6.0–8.3)

## 2016-11-03 LAB — CBC
HCT: 41.5 % (ref 39.0–52.0)
HEMOGLOBIN: 14.4 g/dL (ref 13.0–17.0)
MCHC: 34.8 g/dL (ref 30.0–36.0)
MCV: 91.9 fl (ref 78.0–100.0)
Platelets: 221 10*3/uL (ref 150.0–400.0)
RBC: 4.52 Mil/uL (ref 4.22–5.81)
RDW: 13.5 % (ref 11.5–15.5)
WBC: 9.9 10*3/uL (ref 4.0–10.5)

## 2016-11-03 LAB — HM DIABETES EYE EXAM

## 2016-11-03 LAB — HEMOGLOBIN A1C: Hgb A1c MFr Bld: 9.2 % — ABNORMAL HIGH (ref 4.6–6.5)

## 2016-11-03 NOTE — Assessment & Plan Note (Addendum)
S: poorly controlled. On last check on metformin 1g BID, amaryl 2mg , lantus 38 units at night.. Had some lows previously but was skipping meals.  CBGs- this AM was 300- very rare to be this high- not sure what happened. 2 days ago was as low as 70- responded by drinking a coke- very rare and that's the lowest its been Lab Results  Component Value Date   HGBA1C 7.9 (H) 06/09/2016   HGBA1C 8.1 (H) 02/06/2016   HGBA1C 8.1 (H) 10/09/2015   A/P: update labs today including a1c. Will target a1c below 8 at least

## 2016-11-03 NOTE — Assessment & Plan Note (Signed)
S: suspect controlled on atorvastatin 20mg . No myalgias.  Lab Results  Component Value Date   CHOL 117 03/11/2015   HDL 31.20 (L) 03/11/2015   LDLCALC 61 03/11/2015   LDLDIRECT 88.0 06/09/2016   TRIG 126.0 03/11/2015   CHOLHDL 4 03/11/2015   A/P: ideally <70 but check LDL today and unlikely to change unless over 100

## 2016-11-03 NOTE — Assessment & Plan Note (Signed)
S: controlled on losartan 100mg , labetalol 200mg  BID, hctz 25mg , amlodipine 5mg .  ASCVD 10 year risk calculation if age 57-79: on statin BP Readings from Last 3 Encounters:  11/03/16 104/62  06/09/16 102/72  02/06/16 132/82  A/P: We discussed blood pressure goal of <140/90. Continue current meds:  This should also help with prevention of CKD progression

## 2016-11-03 NOTE — Assessment & Plan Note (Signed)
Gave GI # again to call about capsule study- we may need to follow up on his behalf next visit if doesn't reach out.

## 2016-11-03 NOTE — Progress Notes (Signed)
Subjective:  William [name] is a 56 y.o. year old very pleasant male patient who presents for/with See problem oriented charting ROS- No chest pain or shortness of breath. No headache or blurry vision. Did feel somewhat shaky with CBG 70 two days ago   Past Medical History-  Patient Active Problem List   Diagnosis Date Noted  . Iron deficiency anemia 06/25/2015    Priority: High  . Tobacco use disorder 11/21/2013    Priority: High  . Diabetes mellitus, type 2 ([place]) 09/28/2006    Priority: High  . History of cardiovascular disorder 09/28/2006    Priority: High  . CKD (chronic kidney disease), stage III 04/30/2014    Priority: Medium  . Hyperlipidemia 09/28/2006    Priority: Medium  . Essential hypertension 09/28/2006    Priority: Medium  . Claudication ([place]) 10/09/2015  . GERD (gastroesophageal reflux disease) 09/25/2013  . ONYCHOMYCOSIS, TOENAILS 03/22/2007    Medications- reviewed and updated Current Outpatient Prescriptions  Medication Sig Dispense Refill  . amLODipine (NORVASC) 5 MG tablet Take 1 tablet (5 mg total) by mouth daily. 100 tablet 3  . aspirin 81 MG tablet Take 81 mg by mouth daily.      [name] atorvastatin (LIPITOR) 20 MG tablet Take 1 tablet (20 mg total) by mouth daily. 90 tablet 3  . Blood Glucose Monitoring Suppl (ACCU-CHEK AVIVA PLUS) w/Device KIT Use device to check blood sugar three times a day 1 kit 0  . ferrous sulfate 325 (65 FE) MG tablet Take 1 tablet (325 mg total) by mouth 2 (two) times daily with a meal. 200 tablet 3  . glimepiride (AMARYL) 2 MG tablet Take 1 tablet (2 mg total) by mouth daily before breakfast. 90 tablet 3  . glucose blood (ACCU-CHEK AVIVA) test strip Use to check blood sugar three times a day E11.9 100 each 12  . glucose blood (ONE TOUCH ULTRA TEST) test strip CHECK BLOOD SUGAR 4 TIMES DAILY 400 each 3  . insulin glargine (LANTUS) 100 UNIT/ML injection INJECT 35-40 UNITS INTO THE SKIN ONCE DAILY AS LONG AS SUGAR ABOVE 90. 40 mL 3  .  Insulin Syringe-Needle U-100 (B-D INS SYRINGE 0.5CC/31GX5/16) 31G X 5/16" 0.5 ML MISC Use to test blood sugars daily. Dx: E11.9 100 each 3  . labetalol (NORMODYNE) 200 MG tablet Take 1 tablet (200 mg total) by mouth 2 (two) times daily. 180 tablet 3  . Lancets (ACCU-CHEK SOFT TOUCH) lancets Use as instructed 100 each 12  . losartan-hydrochlorothiazide (HYZAAR) 100-25 MG tablet Take 1 tablet by mouth daily. 90 tablet 3  . metFORMIN (GLUCOPHAGE) 1000 MG tablet Take 1 tablet (1,000 mg total) by mouth 2 (two) times daily with a meal. 180 tablet 3  . omeprazole (PRILOSEC) 20 MG capsule Take 1 capsule (20 mg total) by mouth daily. 90 capsule 3  . ONE TOUCH ULTRA TEST test strip TEST BLOOD SUGAR 3 TIMES A DAY 300 each 5   No current facility-administered medications for this visit.     Objective: BP 104/62 (BP Location: Left Arm, Patient Position: Sitting, Cuff Size: Large)   Pulse 71   Temp 97.8 F (36.6 C) (Oral)   Ht _0  (1.702 m)   Wt 171 lb 9.6 oz (77.8 kg)   SpO2 99%   BMI 26.88 kg/m  Gen: NAD, resting comfortably CV: RRR no murmurs rubs or gallops Lungs: CTAB no crackles, wheeze, rhonchi Ext: no edema Skin: warm, dry Neuro: grossly normal, moves all extremities  Assessment/Plan:  I  received the following mychart message from family friend who wrote on his behalf. Patient and I discussed the below. He denies depression or any SI. He states he just doesn't want to go out quite as much but still visits neighbor daily. Incontinence has been sparing issue if he waits too long to go to bathroom for years- encouraged regular emptying. He agrees to call if depressed mood.    William [name] has an appt Tues.Marland Kitchenand there are some things I think The Dr should know about before he comes in..ever since his eye surgery he does nothing .Marland KitchenSome days he doesn't even leave his house at all..and his personality has changed.Marland Kitchenalso he is wetting himself sometimes..I don't know if he is depressed or if there is  something medical going on...please let Dr Yong Channel know before we come in....thanks    Diabetes mellitus, type 2 ([place]) S: poorly controlled. On last check on metformin 1g BID, amaryl 80m, lantus 38 units at night.. Had some lows previously but was skipping meals.  CBGs- this AM was 300- very rare to be this high- not sure what happened. 2 days ago was as low as 70- responded by drinking a coke- very rare and that's the lowest its been Lab Results  Component Value Date   HGBA1C 7.9 (H) 06/09/2016   HGBA1C 8.1 (H) 02/06/2016   HGBA1C 8.1 (H) 10/09/2015   A/P: update labs today including a1c. Will target a1c below 8 at least  Tobacco use disorder 1/2 PPD- encouraged complete cessation- not ready to quit  Essential hypertension S: controlled on losartan 1064m labetalol 20027mID, hctz 22m45mmlodipine 5mg.62mSCVD 10 year risk calculation if age [id number] BP Readings from Last 3 Encounters:  11/03/16 104/62  06/09/16 102/72  02/06/16 132/82  A/P: We discussed blood pressure goal of <140/90. Continue current meds:  This should also help with prevention of CKD progression  Hyperlipidemia S: suspect controlled on atorvastatin 20mg.47mmyalgias.  Lab Results  Component Value Date   CHOL 117 03/11/2015   HDL 31.20 (L) 03/11/2015   LDLCALC 61 03/11/2015   LDLDIRECT 88.0 06/09/2016   TRIG 126.0 03/11/2015   CHOLHDL 4 03/11/2015   A/P: ideally <70 but check LDL today and unlikely to change unless over 100  Iron deficiency anemia Gave GI # again to call about capsule study- we may need to follow up on his behalf next visit if doesn't reach out.   Bilateral ear irrigation attempted by Jamie  ABIs ordered 06/25/16. Will have Jamie Roselyn Reeftigate why not done.   14 weeks.  Orders Placed This Encounter  Procedures  . CBC    [place]  . Comprehensive metabolic panel    [place]    Order Specific Question:   Has the patient fasted?    Answer:   No  . Lipid panel        Order  Specific Question:   Has the patient fasted?    Answer:   No  . Hemoglobin A1c       Return precautions advised.  StepheGarret Reddish

## 2016-11-03 NOTE — Assessment & Plan Note (Signed)
1/2 PPD- encouraged complete cessation- not ready to quit

## 2016-11-03 NOTE — Patient Instructions (Addendum)
Please call GI to follow up on pill study Phone:  [phone]   Please stop by lab before you go  We will call you about referral to ABI (pulse study for legs)- had ordered back in March- Jamie will look into this  ______________________________________________________________________  Starting October 1st 2018, I will be transferring to our new location: [facility] (corner of [address] and [address] from [employer]) [place], [address] Phone: [phone]  I would love to have you remain my patient at this new location as long as it remains convenient for you. I am excited about the opportunity to have x-ray and sports medicine in the new building but will really miss the awesome staff and physicians at [facility]. Continue to schedule appointments at [facility] and we will automatically transfer them to the [place] location starting October 1st.

## 2016-11-23 ENCOUNTER — Encounter: Payer: Self-pay | Admitting: Family Medicine

## 2016-11-23 ENCOUNTER — Ambulatory Visit (INDEPENDENT_AMBULATORY_CARE_PROVIDER_SITE_OTHER): Payer: Medicare Other | Admitting: Family Medicine

## 2016-11-23 DIAGNOSIS — Z794 Long term (current) use of insulin: Secondary | ICD-10-CM

## 2016-11-23 DIAGNOSIS — E1142 Type 2 diabetes mellitus with diabetic polyneuropathy: Secondary | ICD-10-CM

## 2016-11-23 MED ORDER — GLIMEPIRIDE 4 MG PO TABS: 4.0000 mg | ORAL_TABLET | Freq: Every day | ORAL | 3 refills | Status: DC

## 2016-11-23 NOTE — Progress Notes (Signed)
Subjective:  William [name] is a 56 y.o. year old very pleasant male patient who presents for/with See problem oriented charting ROS- denies hypoglycemia since last visit. Still wants to stay around house more than normal- denies depression.    Past Medical History-  Patient Active Problem List   Diagnosis Date Noted  . Iron deficiency anemia 06/25/2015    Priority: High  . Tobacco use disorder 11/21/2013    Priority: High  . Diabetes mellitus, type 2 ([place]) 09/28/2006    Priority: High  . History of cardiovascular disorder 09/28/2006    Priority: High  . CKD (chronic kidney disease), stage III 04/30/2014    Priority: Medium  . Hyperlipidemia 09/28/2006    Priority: Medium  . Essential hypertension 09/28/2006    Priority: Medium  . Claudication ([place]) 10/09/2015  . GERD (gastroesophageal reflux disease) 09/25/2013  . ONYCHOMYCOSIS, TOENAILS 03/22/2007    Medications- reviewed and updated Current Outpatient Prescriptions  Medication Sig Dispense Refill  . amLODipine (NORVASC) 5 MG tablet Take 1 tablet (5 mg total) by mouth daily. 100 tablet 3  . aspirin 81 MG tablet Take 81 mg by mouth daily.      [name] atorvastatin (LIPITOR) 20 MG tablet Take 1 tablet (20 mg total) by mouth daily. 90 tablet 3  . Blood Glucose Monitoring Suppl (ACCU-CHEK AVIVA PLUS) w/Device KIT Use device to check blood sugar three times a day 1 kit 0  . ferrous sulfate 325 (65 FE) MG tablet Take 1 tablet (325 mg total) by mouth 2 (two) times daily with a meal. 200 tablet 3  . glimepiride (AMARYL) 2 MG tablet Take 1 tablet (2 mg total) by mouth daily before breakfast. 90 tablet 3  . glucose blood (ACCU-CHEK AVIVA) test strip Use to check blood sugar three times a day E11.9 100 each 12  . glucose blood (ONE TOUCH ULTRA TEST) test strip CHECK BLOOD SUGAR 4 TIMES DAILY 400 each 3  . insulin glargine (LANTUS) 100 UNIT/ML injection INJECT 35-40 UNITS INTO THE SKIN ONCE DAILY AS LONG AS SUGAR ABOVE 90. 40 mL 3  . Insulin  Syringe-Needle U-100 (B-D INS SYRINGE 0.5CC/31GX5/16) 31G X 5/16" 0.5 ML MISC Use to test blood sugars daily. Dx: E11.9 100 each 3  . labetalol (NORMODYNE) 200 MG tablet Take 1 tablet (200 mg total) by mouth 2 (two) times daily. 180 tablet 3  . Lancets (ACCU-CHEK SOFT TOUCH) lancets Use as instructed 100 each 12  . losartan-hydrochlorothiazide (HYZAAR) 100-25 MG tablet Take 1 tablet by mouth daily. 90 tablet 3  . metFORMIN (GLUCOPHAGE) 1000 MG tablet Take 1 tablet (1,000 mg total) by mouth 2 (two) times daily with a meal. 180 tablet 3  . omeprazole (PRILOSEC) 20 MG capsule Take 1 capsule (20 mg total) by mouth daily. 90 capsule 3  . ONE TOUCH ULTRA TEST test strip TEST BLOOD SUGAR 3 TIMES A DAY 300 each 5   No current facility-administered medications for this visit.     Objective: BP 128/68 (BP Location: Left Arm, Patient Position: Sitting, Cuff Size: Large)   [name] 73   Temp 98.1 F (36.7 C) (Oral)   Ht '5\' 7"'  (1.702 m)   Wt 175 lb (79.4 kg)   SpO2 95%   BMI 27.41 kg/m  Gen: NAD, resting comfortably on table  Assessment/Plan:  Friend William [name] still worried about depression- we discussed possible lexapro 5 or 76m trial but after discussing possible SI risk - they decline for now.   Diabetes mellitus, type 2 (  William [name]) S: poorly controlled. On metformin 1g BID, amaryl 71m, lantus 38 units at night. Sister GJuliann Pulseand friend RApolonio Schneiderswith him today. They admit after his surgery he was incredibly inactive and much of this was encouraged by them- they can encourage increased activity at this point.  CBGs- sugars in mornings range from 70-150.  Exercise and diet- limited exercise- agrees to increase. Family agrees to help him eat healthier.  Lab Results  Component Value Date   HGBA1C 9.2 (H) 11/03/2016   HGBA1C 7.9 (H) 06/09/2016   HGBA1C 8.1 (H) 02/06/2016    A/P: in donut hole and options limited as result. Discussed increase amaryl to 442mbut would have to watch closely for hypoglycemia.  Given fastings as low as 70 cannot increase lantus (takes all meds in Am). We also discussed options of mealtime insulin like novolog(or switching to NPH/William BID if cost an issue), trulicity , or invokana 10087mlimited to this due to GFR in 50s). Extended counseling today. Asked for 6 week repeat but they do agree to 3 month repeat  Meds ordered this encounter  Medications  . glimepiride (AMARYL) 4 MG tablet    Sig: Take 1 tablet (4 mg total) by mouth daily before breakfast.    Dispense:  90 tablet    Refill:  3   The duration of face-to-face time during this visit was greater than 15 minutes. Greater than 50% of this time was spent in counseling, explanation of diagnosis, planning of further management, and/or coordination of care including discussions of treatment options for DM and lifestyle changes needed.    Return precautions advised.  SteGarret ReddishD

## 2016-11-23 NOTE — Assessment & Plan Note (Signed)
S: poorly controlled. On metformin 1g BID, amaryl 2mg , lantus 38 units at night. Sister William [name] and friend William [name] with him today. They admit after his surgery he was incredibly inactive and much of this was encouraged by them- they can encourage increased activity at this point.  CBGs- sugars in mornings range from 70-150.  Exercise and diet- limited exercise- agrees to increase. Family agrees to help him eat healthier.  Lab Results  Component Value Date   HGBA1C 9.2 (H) 11/03/2016   HGBA1C 7.9 (H) 06/09/2016   HGBA1C 8.1 (H) 02/06/2016    A/P: in donut hole and options limited as result. Discussed increase amaryl to 4mg  but would have to watch closely for hypoglycemia. Given fastings as low as 70 cannot increase lantus (takes all meds in Am). We also discussed options of mealtime insulin like novolog(or switching to NPH/R BID if cost an issue), trulicity , or invokana 100mg  (limited to this due to GFR in 50s). Extended counseling today. Asked for 6 week repeat but they do agree to 3 month repeat

## 2016-11-23 NOTE — Patient Instructions (Addendum)
Considering 1. Invokana 100mg  2. Trulicity 3. Mealtime insulin like novolog or humalog 4. Diabetes education 5. Medication like lexapro 5mg  for potential depression  For now: 1. Increase walking- at least 30 minutes a day 2. Increase amaryl to 4mg - please make sure to eat 3 meals a day as this can cause low blood sugar. Continue lantus 38 units and metformin

## 2016-12-02 ENCOUNTER — Ambulatory Visit: Payer: Medicare Other

## 2016-12-22 ENCOUNTER — Encounter: Payer: Self-pay | Admitting: Family Medicine

## 2016-12-30 ENCOUNTER — Telehealth: Payer: Self-pay | Admitting: Family Medicine

## 2016-12-30 NOTE — Telephone Encounter (Signed)
[facility] is needing a call in reference to Rx Zocor with PZZ#[id number] the response can be sent to [phone](F)

## 2017-01-04 NOTE — Telephone Encounter (Signed)
Called and spoke to the pharmacy. Informed pharmacy that he is not on Zocor as he is on Atorvastatin.

## 2017-01-08 ENCOUNTER — Telehealth: Payer: Self-pay | Admitting: Family Medicine

## 2017-01-08 ENCOUNTER — Other Ambulatory Visit: Payer: Self-pay | Admitting: Family Medicine

## 2017-01-08 NOTE — Telephone Encounter (Signed)
New Message  Pt dropped off Disability Paperwork to be filled out by 01/31/2017.  Pt voiced to call for pickup, [phone] (cell) or [phone] (work).  Placed in MD's folder

## 2017-02-08 ENCOUNTER — Encounter: Payer: Self-pay | Admitting: Family Medicine

## 2017-02-09 ENCOUNTER — Encounter: Payer: Self-pay | Admitting: Family Medicine

## 2017-02-09 ENCOUNTER — Ambulatory Visit (INDEPENDENT_AMBULATORY_CARE_PROVIDER_SITE_OTHER): Payer: Medicare Other | Admitting: Family Medicine

## 2017-02-09 VITALS — BP 108/70 | HR 69 | Temp 97.7°F | Ht 67.0 in | Wt 172.8 lb

## 2017-02-09 DIAGNOSIS — E119 Type 2 diabetes mellitus without complications: Secondary | ICD-10-CM

## 2017-02-09 DIAGNOSIS — Z723 Lack of physical exercise: Secondary | ICD-10-CM | POA: Diagnosis not present

## 2017-02-09 DIAGNOSIS — Z23 Encounter for immunization: Secondary | ICD-10-CM

## 2017-02-09 LAB — POCT GLYCOSYLATED HEMOGLOBIN (HGB A1C): HEMOGLOBIN A1C: 8

## 2017-02-09 MED ORDER — INSULIN REGULAR HUMAN 100 UNIT/ML IJ SOLN: INTRAMUSCULAR | 5 refills | Status: DC

## 2017-02-09 NOTE — Assessment & Plan Note (Signed)
S: mychart From caregiver "Good morning.Marland KitchenMarland KitchenMarland KitchenI am not going to be able to come with William [name] on his visit Tues. and I know he will tell Dr. Retail banker everything is ok.....but there are some things the Dr. Hazel Sams to know......1--he is doing nothing..some days he doesn't even get out of the house...2--he is still smoking.[name]3--he is wetting himself...4--he just don't care!! We had talked about an antidepressant before and I wondered what Dr. Yong Channel thinks about William [name] taking one?  If you have ant questions or comments pleas contact me...Marland KitchenMarland KitchenThanks for all you do.Marland KitchenMarland KitchenMarland KitchenOtho Perl"  He states used to mow but all people he knew died. This is the reason he has been less active per family friends. Had discussed lexapro 5mg  trial. He firmly declines. For incontinence states simply waits too long as gets focused on something else- strongly discouraged this- he states he can make it to the bathroom if he needs to/focuses A/P: we discussed importance of being more active considering his diabetes. Discussed must go to bathroom as this is a normal habit for adult. He fiercely denies depression- does not want to take medication. monitor

## 2017-02-09 NOTE — Patient Instructions (Signed)
Stop glimepiride  Reduce lantus (all day insulin) to 33 units each evening.   Start regular insulin (mealtime insulin) at 5 units before lunch or dinner (whichever will be largest meal of day)  Follow up in 1 month

## 2017-02-09 NOTE — Progress Notes (Signed)
Subjective:  William [name] is a 56 y.o. year old very pleasant male patient who presents for/with See problem oriented charting ROS- has had hypoglycemia. No shortness of breath. No chest pain. No edema.    Past Medical History-  Patient Active Problem List   Diagnosis Date Noted  . Iron deficiency anemia 06/25/2015    Priority: High  . Tobacco use disorder 11/21/2013    Priority: High  . Diabetes mellitus, type 2 ([place]) 09/28/2006    Priority: High  . History of cardiovascular disorder 09/28/2006    Priority: High  . CKD (chronic kidney disease), stage III ([place]) 04/30/2014    Priority: Medium  . Hyperlipidemia 09/28/2006    Priority: Medium  . Essential hypertension 09/28/2006    Priority: Medium  . Inactivity 02/09/2017  . Claudication ([place]) 10/09/2015  . GERD (gastroesophageal reflux disease) 09/25/2013  . ONYCHOMYCOSIS, TOENAILS 03/22/2007    Medications- reviewed and updated Current Outpatient Prescriptions  Medication Sig Dispense Refill  . amLODipine (NORVASC) 5 MG tablet Take 1 tablet (5 mg total) by mouth daily. 100 tablet 3  . aspirin 81 MG tablet Take 81 mg by mouth daily.      [name] atorvastatin (LIPITOR) 20 MG tablet Take 1 tablet (20 mg total) by mouth daily. 90 tablet 3  . Blood Glucose Monitoring Suppl (ACCU-CHEK AVIVA PLUS) w/Device KIT Use device to check blood sugar three times a day 1 kit 0  . ferrous sulfate 325 (65 FE) MG tablet Take 1 tablet (325 mg total) by mouth 2 (two) times daily with a meal. 200 tablet 3  . glimepiride (AMARYL) 4 MG tablet Take 1 tablet (4 mg total) by mouth daily before breakfast. 90 tablet 3  . glucose blood (ACCU-CHEK AVIVA) test strip Use to check blood sugar three times a day E11.9 100 each 12  . glucose blood (ONE TOUCH ULTRA TEST) test strip CHECK BLOOD SUGAR 4 TIMES DAILY 400 each 3  . insulin glargine (LANTUS) 100 UNIT/ML injection INJECT 35-40 UNITS INTO THE SKIN ONCE DAILY AS LONG AS SUGAR ABOVE 90. 40 mL 3  . Insulin  Syringe-Needle U-100 (B-D INS SYRINGE 0.5CC/31GX5/16) 31G X 5/16" 0.5 ML MISC Use to test blood sugars daily. Dx: E11.9 100 each 3  . labetalol (NORMODYNE) 200 MG tablet TAKE 1 TABLET BY MOUTH TWO  TIMES DAILY 180 tablet 1  . Lancets (ACCU-CHEK SOFT TOUCH) lancets Use as instructed 100 each 12  . losartan-hydrochlorothiazide (HYZAAR) 100-25 MG tablet TAKE 1 TABLET BY MOUTH  DAILY 90 tablet 1  . metFORMIN (GLUCOPHAGE) 1000 MG tablet TAKE 1 TABLET BY MOUTH TWO  TIMES DAILY WITH A MEAL 180 tablet 1  . omeprazole (PRILOSEC) 20 MG capsule TAKE 1 CAPSULE BY MOUTH  DAILY 90 capsule 1  . ONE TOUCH ULTRA TEST test strip TEST BLOOD SUGAR 3 TIMES A DAY 300 each 5  . insulin regular (NOVOLIN R,HUMULIN R) 100 units/mL injection Meal time insulin. Take 5-10 units daily before biggest meal of day. Start with 5 units. Only increase if Dr. Yong Channel instructs you to. If too [facility] at [facility]. 3 mL 5   No current facility-administered medications for this visit.     Objective: BP 108/70 (BP Location: Left Arm, Patient Position: Sitting, Cuff Size: Large)   Pulse 69   Temp 97.7 F (36.5 C) (Oral)   Ht '5\' 7"'  (1.702 m)   Wt 172 lb 12.8 oz (78.4 kg)   SpO2 99%   BMI 27.06 kg/m  Gen: NAD, resting comfortably CV: RRR  Lungs: no wheeze, nonlabored Ext: no edema Skin: warm, dry Neuro: normal gait  Assessment/Plan:  Inactivity S: mychart From caregiver "Good morning.Marland KitchenMarland KitchenMarland KitchenI am not going to be able to come with Sharyl Nimrod on his visit Tues. and I know he will tell Dr. Retail banker everything is ok.....but there are some things the Dr. Hazel Sams to know......1--he is doing nothing..some days he doesn't even get out of the house...2--he is still smoking.[name]3--he is wetting himself...4--he just don't care!! We had talked about an antidepressant before and I wondered what Dr. Yong Channel thinks about [name] taking one?  If you have ant questions or comments pleas contact me...Marland KitchenMarland KitchenThanks for all you do.Marland KitchenMarland KitchenMarland KitchenOtho Perl"  He  states used to mow but all people he knew died. This is the reason he has been less active per family friends. Had discussed lexapro 2m trial. He firmly declines. For incontinence states simply waits too long as gets focused on something else- strongly discouraged this- he states he can make it to the bathroom if he needs to/focuses A/P: we discussed importance of being more active considering his diabetes. Discussed must go to bathroom as this is a normal habit for adult. He fiercely denies depression- does not want to take medication. monitor  Diabetes mellitus, type 2 ([place] S: Limited options with patient in donut hole. We increased amaryl to 436mlast visit. He was already on metformin 1g BID and lantus 38 units at night. He had been highly inactive after surgery- they tell me by mychart this has persisted- question if depression is cause.   CBGs- 60-150 in AM with one outlier in 200s. Most evenings he is in the 200s to 300s. Has had several lows  Lab Results  Component Value Date   HGBA1C 8.0 02/09/2017   HGBA1C 9.2 (H) 11/03/2016   HGBA1C 7.9 (H) 06/09/2016   A/P: a1c has improved but at expense of hypoglycemia on higher amaryl at 45m37mwe will cut amaryl. Sugars around dinner highest in 200s and even 300s. Will reduce lantus to 33 units from 38. Start regular insulin with largest meal of day either lunch or dinner- may increase further at next visit. With this change encouraged 1 month follow up- sooner if hypoglycemia   Future Appointments Date Time Provider DepCary1/26/2018 11:15 AM HunMarin OlpD [facility] None     Orders Placed This Encounter  Procedures  . Flu Vaccine QUAD 36+ mos IM  . POCT glycosylated hemoglobin (Hb A1C)    Meds ordered this encounter  Medications  . insulin regular (NOVOLIN R,HUMULIN R) 100 units/mL injection    Sig: Meal time insulin. Take 5-10 units daily before biggest meal of day. Start with 5 units. Only increase if Dr. HunYong Channelnstructs you to. [facility].    Dispense:  3 mL    Refill:  5    Return precautions advised.  SteGarret ReddishD

## 2017-02-09 NOTE — Assessment & Plan Note (Signed)
S: Limited options with patient in donut hole. We increased amaryl to 4mg  last visit. He was already on metformin 1g BID and lantus 38 units at night. He had been highly inactive after surgery- they tell me by mychart this has persisted- question if depression is cause.   CBGs- 60-150 in AM with one outlier in 200s. Most evenings he is in the 200s to 300s. Has had several lows  Lab Results  Component Value Date   HGBA1C 8.0 02/09/2017   HGBA1C 9.2 (H) 11/03/2016   HGBA1C 7.9 (H) 06/09/2016   A/P: a1c has improved but at expense of hypoglycemia on higher amaryl at 4mg - we will cut amaryl. Sugars around dinner highest in 200s and even 300s. Will reduce lantus to 33 units from 38. Start regular insulin with largest meal of day either lunch or dinner- may increase further at next visit. With this change encouraged 1 month follow up- sooner if hypoglycemia

## 2017-03-15 ENCOUNTER — Ambulatory Visit (INDEPENDENT_AMBULATORY_CARE_PROVIDER_SITE_OTHER): Payer: Medicare Other | Admitting: Family Medicine

## 2017-03-15 ENCOUNTER — Encounter: Payer: Self-pay | Admitting: Family Medicine

## 2017-03-15 DIAGNOSIS — F17200 Nicotine dependence, unspecified, uncomplicated: Secondary | ICD-10-CM | POA: Diagnosis not present

## 2017-03-15 DIAGNOSIS — E1142 Type 2 diabetes mellitus with diabetic polyneuropathy: Secondary | ICD-10-CM

## 2017-03-15 DIAGNOSIS — Z794 Long term (current) use of insulin: Secondary | ICD-10-CM

## 2017-03-15 NOTE — Assessment & Plan Note (Signed)
S: improved control last visit per a1c 8.0 down from 9.2- On metformin 1 g BID, amaryl 4mg , lantus 38 units but sadly had hypoglycemia as low as 60s in AM but evenings up to 200-300. We cut his amaryl out. We reduced lantus to 33 units from 38. We added regular insulin with largest meal of day (lunch or dinner) CBGs- 131 this morning. Has not had anymore lows. Yesterday morning 225. Lowest in the morning was 71 (will watch closely). Usually 250-300 in the evening.  Exercise and diet- still rather sedentary- has been raking some leaves Lab Results  Component Value Date   HGBA1C 8.0 02/09/2017   HGBA1C 9.2 (H) 11/03/2016   HGBA1C 7.9 (H) 06/09/2016   A/P: from avs  With 1 month follow up planned "Continue lantus 33 units each evening.   Continue regular insulin (mealtime insulin) but increase to 8 units before lunch or dinner (whichever will be largest meal of day). Next visit we may have you take insulin before lunch and before dinner if sugars are not doing better. "

## 2017-03-15 NOTE — Progress Notes (Signed)
Subjective:  William [name] is a 56 y.o. year old very pleasant male patient who presents for/with See problem oriented charting ROS- has had hypoglycemia episodes- sweaty, anxious. Still with some fatigue. No chest pain or shortness of breath. No edema.    Past Medical History-  Patient Active Problem List   Diagnosis Date Noted  . Iron deficiency anemia 06/25/2015    Priority: High  . Tobacco use disorder 11/21/2013    Priority: High  . Diabetes mellitus, type 2 ([place]) 09/28/2006    Priority: High  . History of cardiovascular disorder 09/28/2006    Priority: High  . CKD (chronic kidney disease), stage III ([place]) 04/30/2014    Priority: Medium  . Hyperlipidemia 09/28/2006    Priority: Medium  . Essential hypertension 09/28/2006    Priority: Medium  . Inactivity 02/09/2017  . Claudication ([place]) 10/09/2015  . GERD (gastroesophageal reflux disease) 09/25/2013  . ONYCHOMYCOSIS, TOENAILS 03/22/2007    Medications- reviewed and updated Current Outpatient Medications  Medication Sig Dispense Refill  . amLODipine (NORVASC) 5 MG tablet Take 1 tablet (5 mg total) by mouth daily. 100 tablet 3  . aspirin 81 MG tablet Take 81 mg by mouth daily.      [name] atorvastatin (LIPITOR) 20 MG tablet Take 1 tablet (20 mg total) by mouth daily. 90 tablet 3  . Blood Glucose Monitoring Suppl (ACCU-CHEK AVIVA PLUS) w/Device KIT Use device to check blood sugar three times a day 1 kit 0  . ferrous sulfate 325 (65 FE) MG tablet Take 1 tablet (325 mg total) by mouth 2 (two) times daily with a meal. 200 tablet 3  . glimepiride (AMARYL) 4 MG tablet Take 1 tablet (4 mg total) by mouth daily before breakfast. 90 tablet 3  . glucose blood (ACCU-CHEK AVIVA) test strip Use to check blood sugar three times a day E11.9 100 each 12  . glucose blood (ONE TOUCH ULTRA TEST) test strip CHECK BLOOD SUGAR 4 TIMES DAILY 400 each 3  . insulin glargine (LANTUS) 100 UNIT/ML injection INJECT 35-40 UNITS INTO THE SKIN ONCE DAILY AS  LONG AS SUGAR ABOVE 90. 40 mL 3  . insulin regular (NOVOLIN R,HUMULIN R) 100 units/mL injection Meal time insulin. Take 5-10 units daily before biggest meal of day. Start with 5 units. Only increase if Dr. Yong Channel instructs you to. If too expensive at [facility] check [facility]. 3 mL 5  . Insulin Syringe-Needle U-100 (B-D INS SYRINGE 0.5CC/31GX5/16) 31G X 5/16" 0.5 ML MISC Use to test blood sugars daily. Dx: E11.9 100 each 3  . labetalol (NORMODYNE) 200 MG tablet TAKE 1 TABLET BY MOUTH TWO  TIMES DAILY 180 tablet 1  . Lancets (ACCU-CHEK SOFT TOUCH) lancets Use as instructed 100 each 12  . losartan-hydrochlorothiazide (HYZAAR) 100-25 MG tablet TAKE 1 TABLET BY MOUTH  DAILY 90 tablet 1  . metFORMIN (GLUCOPHAGE) 1000 MG tablet TAKE 1 TABLET BY MOUTH TWO  TIMES DAILY WITH A MEAL 180 tablet 1  . omeprazole (PRILOSEC) 20 MG capsule TAKE 1 CAPSULE BY MOUTH  DAILY 90 capsule 1  . ONE TOUCH ULTRA TEST test strip TEST BLOOD SUGAR 3 TIMES A DAY 300 each 5   No current facility-administered medications for this visit.     Objective: BP 128/76 (BP Location: Left Arm, Patient Position: Sitting, Cuff Size: Large)   Pulse 71   Temp (!) 97.5 F (36.4 C) (Oral)   Ht 5' 7" (1.702 m)   Wt 172 lb 6.4 oz (78.2 kg)  SpO2 96%   BMI 27.00 kg/m  Gen: NAD, resting comfortably CV: RRR no murmurs rubs or gallops Lungs: CTAB no crackles, wheeze, rhonchi Abdomen: soft/nontender/nondistended/normal bowel sounds.  Ext: no edema Skin: warm, dry  Assessment/Plan:  Diabetes mellitus, type 2 (HCC) S: improved control last visit per a1c 8.0 down from 9.2- On metformin 1 g BID, amaryl 57m, lantus 38 units but sadly had hypoglycemia as low as 60s in AM but evenings up to 200-300. We cut his amaryl out. We reduced lantus to 33 units from 38. We added regular insulin with largest meal of day (lunch or dinner) CBGs- 131 this morning. Has not had anymore lows. Yesterday morning 225. Lowest in the morning was 71 (will watch closely).  Usually 250-300 in the evening.  Exercise and diet- still rather sedentary- has been raking some leaves Lab Results  Component Value Date   HGBA1C 8.0 02/09/2017   HGBA1C 9.2 (H) 11/03/2016   HGBA1C 7.9 (H) 06/09/2016   A/P: from avs  With 1 month follow up planned "Continue lantus 33 units each evening.   Continue regular insulin (mealtime insulin) but increase to 8 units before lunch or dinner (whichever will be largest meal of day). Next visit we may have you take insulin before lunch and before dinner if sugars are not doing better. "  Tobacco use disorder S:  smoking 1/2 pack per day, at least down from prior PPD A/P: encouraged cessation- wants to just try to cut back   Future Appointments  Date Time Provider DMona 04/21/2017 10:00 AM HMarin Olp MD [facility] None   Return precautions advised.  SGarret Reddish MD

## 2017-03-15 NOTE — Patient Instructions (Signed)
Continue lantus 33 units each evening.   Continue regular insulin (mealtime insulin) but increase to 8 units before lunch or dinner (whichever will be largest meal of day). Next visit we may have you take insulin before lunch and before dinner if sugars are not doing better.   Follow up in 1 month

## 2017-03-15 NOTE — Assessment & Plan Note (Signed)
S:  smoking 1/2 pack per day, at least down from prior PPD A/P: encouraged cessation- wants to just try to cut back

## 2017-03-24 ENCOUNTER — Other Ambulatory Visit: Payer: Self-pay

## 2017-03-24 MED ORDER — FERROUS SULFATE 325 (65 FE) MG PO TABS: 325.0000 mg | ORAL_TABLET | Freq: Two times a day (BID) | ORAL | 3 refills | Status: AC

## 2017-04-21 ENCOUNTER — Ambulatory Visit: Payer: Medicare Other | Admitting: Family Medicine

## 2017-04-22 ENCOUNTER — Other Ambulatory Visit: Payer: Self-pay | Admitting: Family Medicine

## 2017-04-23 ENCOUNTER — Encounter: Payer: Self-pay | Admitting: Family Medicine

## 2017-04-23 ENCOUNTER — Ambulatory Visit: Payer: Medicare Other | Admitting: Family Medicine

## 2017-04-23 VITALS — BP 100/62 | HR 78 | Temp 97.9°F | Ht 67.0 in | Wt 168.4 lb

## 2017-04-23 DIAGNOSIS — E1142 Type 2 diabetes mellitus with diabetic polyneuropathy: Secondary | ICD-10-CM

## 2017-04-23 DIAGNOSIS — Z794 Long term (current) use of insulin: Secondary | ICD-10-CM | POA: Diagnosis not present

## 2017-04-23 DIAGNOSIS — I10 Essential (primary) hypertension: Secondary | ICD-10-CM

## 2017-04-23 DIAGNOSIS — F320 Major depressive disorder, single episode, mild: Secondary | ICD-10-CM | POA: Diagnosis not present

## 2017-04-23 DIAGNOSIS — E785 Hyperlipidemia, unspecified: Secondary | ICD-10-CM | POA: Diagnosis not present

## 2017-04-23 DIAGNOSIS — F17200 Nicotine dependence, unspecified, uncomplicated: Secondary | ICD-10-CM

## 2017-04-23 MED ORDER — BUPROPION HCL ER (XL) 150 MG PO TB24
150.0000 mg | ORAL_TABLET | Freq: Every day | ORAL | 5 refills | Status: DC
Start: 2017-04-23 — End: 2018-05-26

## 2017-04-23 NOTE — Patient Instructions (Addendum)
Start wellbutrin. This helps suppress desire to smoke and works on depression. In some people it can worsen anxiety if that is present- if you end up more anxious than normal let us know  Decrease lantus to 31 units. Continue regular insulin 8 units before lunch or dinner (whichever is biggest meal). Let me know if any blood sugars under 70  Hold metformin for 1 week to see if that can help with the diarrhea then may restart half tab twice a day for a month to see if symptoms can be avoided  See [place] back in 1 month to check in  Taking the medicine as directed and not missing any doses is one of the best things you can do to treat your depression.  Here are some things to keep in mind:  1) Side effects (stomach upset, some increased anxiety) may happen before you notice a benefit.  These side effects typically go away over time. 2) Changes to your dose of medicine or a change in medication all together is sometimes necessary 3) Most people need to be on medication at least 6-12 months 4) Many people will notice an improvement within two weeks but the full effect of the medication can take up to 4-6 weeks 5) Stopping the medication when you start feeling better often results in a return of symptoms 6) If you start having thoughts of hurting yourself or others after starting this medicine, call our office immediately at [phone] or seek care through 911.

## 2017-04-23 NOTE — Assessment & Plan Note (Signed)
S: reasonably controlled on lipitor 40mg , No myalgias.  Lab Results  Component Value Date   CHOL 129 11/03/2016   HDL 31.70 (L) 11/03/2016   LDLCALC 60 11/03/2016   LDLDIRECT 88.0 06/09/2016   TRIG 185.0 (H) 11/03/2016   CHOLHDL 4 11/03/2016   A/P: LDLbelow 70 on last check which is ideal. With some of the fatigue patient complains of and with him not being best historian- if doesn't improve on antidepressant could consider reducing dose with LDL target under 100

## 2017-04-23 NOTE — Assessment & Plan Note (Signed)
S: controlled on amlodipine 5mg .  labetalol 200mg  twice a day, losartan-hctz 100-25mg . BP Readings from Last 3 Encounters:  04/23/17 100/62  03/15/17 128/76  02/09/17 108/70  A/P: We discussed blood pressure goal of <140/90. Continue current meds:  Will monitor as running lower 2 of last 3 checks

## 2017-04-23 NOTE — Assessment & Plan Note (Signed)
smoking 1/2 ppd still- advised complete cessation. Placing on wellbutrin which hopefully can help

## 2017-04-23 NOTE — Assessment & Plan Note (Signed)
S: suspect mild poorly controlled. On metformin 1g BID,  lantus 33 units each evening (down from 38), regular insulin 8 units before lunch or dinner (up slightly).   Was having lows on 38 units lantus along with amaryl. So we changed to this regimen CBGs-  in mornings has been about 70-80 but as low as 67 this morning.  Exercise and diet- not active- reports low energy and decreased interest in activities Lab Results  Component Value Date   HGBA1C 8.0 02/09/2017   HGBA1C 9.2 (H) 11/03/2016   HGBA1C 7.9 (H) 06/09/2016   A/P: continue metformin (after holding for a week and restarting slowly to see if helps with diarrhea). Reduce lantus to 31 units from 33. May continue 8 units regular insulin with largest meal of day. Recheck a1c next visit and can check labs to evaluate fatigue as well if persists including cbc, cmp, tsh. He should alert [place] to any lows. Will target a1c below 8 with cognitive issues

## 2017-04-23 NOTE — Progress Notes (Signed)
Subjective:  William [name] is a 57 y.o. year old very pleasant male patient who presents for/with See problem oriented charting ROS- hypoglyemia this am. No chest pain. Complains of fatigue and decreased interest. No SI.    Past Medical History-  Patient Active Problem List   Diagnosis Date Noted  . Iron deficiency anemia 06/25/2015    Priority: High  . Tobacco use disorder 11/21/2013    Priority: High  . Diabetes mellitus, type 2 ([place]) 09/28/2006    Priority: High  . History of cardiovascular disorder 09/28/2006    Priority: High  . CKD (chronic kidney disease), stage III ([place]) 04/30/2014    Priority: Medium  . Hyperlipidemia 09/28/2006    Priority: Medium  . Essential hypertension 09/28/2006    Priority: Medium  . Depression, major, single episode, mild ([place]) 04/23/2017  . Inactivity 02/09/2017  . Claudication ([place]) 10/09/2015  . GERD (gastroesophageal reflux disease) 09/25/2013  . ONYCHOMYCOSIS, TOENAILS 03/22/2007    Medications- reviewed and updated Current Outpatient Medications  Medication Sig Dispense Refill  . amLODipine (NORVASC) 5 MG tablet Take 1 tablet (5 mg total) by mouth daily. 100 tablet 3  . aspirin 81 MG tablet Take 81 mg by mouth daily.      [name] atorvastatin (LIPITOR) 20 MG tablet TAKE 1 TABLET BY MOUTH  DAILY 90 tablet 3  . Blood Glucose Monitoring Suppl (ACCU-CHEK AVIVA PLUS) w/Device KIT Use device to check blood sugar three times a day 1 kit 0  . ferrous sulfate 325 (65 FE) MG tablet Take 1 tablet (325 mg total) by mouth 2 (two) times daily with a meal. 200 tablet 3  . glimepiride (AMARYL) 4 MG tablet Take 1 tablet (4 mg total) by mouth daily before breakfast. 90 tablet 3  . glucose blood (ACCU-CHEK AVIVA) test strip Use to check blood sugar three times a day E11.9 100 each 12  . glucose blood (ONE TOUCH ULTRA TEST) test strip CHECK BLOOD SUGAR 4 TIMES DAILY 400 each 3  . insulin glargine (LANTUS) 100 UNIT/ML injection INJECT 35-40 UNITS INTO THE SKIN  ONCE DAILY AS LONG AS SUGAR ABOVE 90. 40 mL 3  . insulin regular (NOVOLIN R,HUMULIN R) 100 units/mL injection Meal time insulin. Take 5-10 units daily before biggest meal of day. Start with 5 units. Only increase if Dr. Yong Channel instructs you to. If too [facility]. 3 mL 5  . Insulin Syringe-Needle U-100 (B-D INS SYRINGE 0.5CC/31GX5/16) 31G X 5/16" 0.5 ML MISC Use to test blood sugars daily. Dx: E11.9 100 each 3  . labetalol (NORMODYNE) 200 MG tablet TAKE 1 TABLET BY MOUTH TWO  TIMES DAILY 180 tablet 1  . Lancets (ACCU-CHEK SOFT TOUCH) lancets Use as instructed 100 each 12  . losartan-hydrochlorothiazide (HYZAAR) 100-25 MG tablet TAKE 1 TABLET BY MOUTH  DAILY 90 tablet 1  . metFORMIN (GLUCOPHAGE) 1000 MG tablet TAKE 1 TABLET BY MOUTH TWO  TIMES DAILY WITH A MEAL 180 tablet 1  . omeprazole (PRILOSEC) 20 MG capsule TAKE 1 CAPSULE BY MOUTH  DAILY 90 capsule 1  . ONE TOUCH ULTRA TEST test strip TEST BLOOD SUGAR 3 TIMES A DAY 300 each 5   Objective: BP 100/62   Pulse 78   Temp 97.9 F (36.6 C) (Oral)   Ht '5\' 7"'  (1.702 m)   Wt 168 lb 6.4 oz (76.4 kg)   SpO2 97%   BMI 26.38 kg/m  Gen: NAD, resting comfortably CV: RRR no murmurs rubs or gallops Lungs:  CTAB no crackles, wheeze, rhonchi Abdomen: soft/nontender/nondistended/normal bowel sounds. Ext: no edema Skin: warm, dry Psych: seems more easily agitated, slightly down mood  Assessment/Plan:  Hyperlipidemia S: reasonably controlled on lipitor 62m, No myalgias.  Lab Results  Component Value Date   CHOL 129 11/03/2016   HDL 31.70 (L) 11/03/2016   LDLCALC 60 11/03/2016   LDLDIRECT 88.0 06/09/2016   TRIG 185.0 (H) 11/03/2016   CHOLHDL 4 11/03/2016   A/P: LDLbelow 70 on last check which is ideal. With some of the fatigue patient complains of and with him not being best historian- if doesn't improve on antidepressant could consider reducing dose with LDL target under 100  Essential hypertension S: controlled on  amlodipine 516m  labetalol 20076mwice a day, losartan-hctz 100-62m70mP Readings from Last 3 Encounters:  04/23/17 100/62  03/15/17 128/76  02/09/17 108/70  A/P: We discussed blood pressure goal of <140/90. Continue current meds:  Will monitor as running lower 2 of last 3 checks  Diabetes mellitus, type 2 (HCC)[place] suspect mild poorly controlled. On metformin 1g BID,  lantus 33 units each evening (down from 38), regular insulin 8 units before lunch or dinner (up slightly).   Was having lows on 38 units lantus along with amaryl. So we changed to this regimen CBGs-  in mornings has been about 70-80 but as low as 67 this morning.  Exercise and diet- not active- reports low energy and decreased interest in activities Lab Results  Component Value Date   HGBA1C 8.0 02/09/2017   HGBA1C 9.2 (H) 11/03/2016   HGBA1C 7.9 (H) 06/09/2016   A/P: continue metformin (after holding for a week and restarting slowly to see if helps with diarrhea). Reduce lantus to 31 units from 33. May continue 8 units regular insulin with largest meal of day. Recheck a1c next visit and can check labs to evaluate fatigue as well if persists including cbc, cmp, tsh. He should alert us t[place]any lows. Will target a1c below 8 with cognitive issues  Tobacco use disorder smoking 1/2 ppd still- advised complete cessation. Placing on wellbutrin which hopefully can help  Depression, major, single episode, mild (HCC)[place] today is at least hte 3rd time that family has complained of concern of depression. Patient states he is low energy and just doesn't want to do anything. Family states he is not as engaged as usual and seems to have decreased interest A/P: patient finally agreeing to at least consider depression as cause of his symptoms. did not get baseline phq9 but will check at follow up. We will trial wellbutrin 150mg60mwith return precautions and 1 month follow up. He does not complain of SOB or chest pain with exertion but will  monitor for those as potential causes/signs of cardiac illness leading to low energy   Future Appointments  Date Time Provider Dep[place]/2019  3:30 PM HunteMarin [facility]   Return in about 4 weeks (around 05/21/2017).  Meds ordered this encounter  Medications  . buPROPion (WELLBUTRIN XL) 150 MG 24 hr tablet    Sig: Take 1 tablet (150 mg total) by mouth daily.    Dispense:  30 tablet    Refill:  5    Return precautions advised.  StephGarret Reddish

## 2017-04-23 NOTE — Assessment & Plan Note (Signed)
S: today is at least hte 3rd time that family has complained of concern of depression. Patient states he is low energy and just doesn't want to do anything. Family states he is not as engaged as usual and seems to have decreased interest A/P: patient finally agreeing to at least consider depression as cause of his symptoms. did not get baseline phq9 but will check at follow up. We will trial wellbutrin 150mg  xr with return precautions and 1 month follow up. He does not complain of SOB or chest pain with exertion but will monitor for those as potential causes/signs of cardiac illness leading to low energy

## 2017-04-29 ENCOUNTER — Other Ambulatory Visit: Payer: Self-pay

## 2017-04-29 MED ORDER — "INSULIN SYRINGE-NEEDLE U-100 31G X 5/16"" 0.5 ML MISC": 3 refills | Status: DC

## 2017-05-25 ENCOUNTER — Ambulatory Visit (INDEPENDENT_AMBULATORY_CARE_PROVIDER_SITE_OTHER): Payer: Medicare Other

## 2017-05-25 ENCOUNTER — Encounter: Payer: Self-pay | Admitting: Family Medicine

## 2017-05-25 ENCOUNTER — Ambulatory Visit: Payer: Medicare Other | Admitting: Family Medicine

## 2017-05-25 VITALS — BP 108/70 | HR 77 | Temp 97.6°F | Ht 67.0 in | Wt 164.8 lb

## 2017-05-25 DIAGNOSIS — E1142 Type 2 diabetes mellitus with diabetic polyneuropathy: Secondary | ICD-10-CM

## 2017-05-25 DIAGNOSIS — F320 Major depressive disorder, single episode, mild: Secondary | ICD-10-CM

## 2017-05-25 DIAGNOSIS — R05 Cough: Secondary | ICD-10-CM | POA: Diagnosis not present

## 2017-05-25 DIAGNOSIS — R5383 Other fatigue: Secondary | ICD-10-CM | POA: Diagnosis not present

## 2017-05-25 DIAGNOSIS — R059 Cough, unspecified: Secondary | ICD-10-CM

## 2017-05-25 DIAGNOSIS — Z125 Encounter for screening for malignant neoplasm of prostate: Secondary | ICD-10-CM

## 2017-05-25 DIAGNOSIS — R0789 Other chest pain: Secondary | ICD-10-CM | POA: Diagnosis not present

## 2017-05-25 DIAGNOSIS — D509 Iron deficiency anemia, unspecified: Secondary | ICD-10-CM | POA: Diagnosis not present

## 2017-05-25 DIAGNOSIS — Z794 Long term (current) use of insulin: Secondary | ICD-10-CM | POA: Diagnosis not present

## 2017-05-25 NOTE — Assessment & Plan Note (Signed)
S: trial wellbutrin to see if helps with smoking cessation. Has only intermittently taken this- actually hard to know how many doses he has taken. Since he has started has reported low appetite and everything tasting like salt. Broke out crying this AM for no reason other than reporting not feeling well A/P: at first considered getting him to be more consistent. With medicaiton- but wonder if we can add to him feeling better by subtracting this medicine- in case the wellbutrin is causing the change in taste and nausea. Follow up 1 week advised

## 2017-05-25 NOTE — Assessment & Plan Note (Signed)
S: poorly controlled on last check(we will set a1c goal at 8 given cognitive issues). I suspect a1c is even higher this visit. His meter shows 30 day average of 236. Does still have some #s into the 70s- note one to 68 in evening- states he ended up not feeling like eating.   On last check- We reduced lantus to 31 units from 33 last visit to help avoid hypoglycemia. Using 8 units regular insulin with largest meal of day.   He is also on metformin - now at 1000mg  twice a day. Diarrhea improved on once a day metformin but his blood sugars got up over 400 at times so they restarted this.    Lab Results  Component Value Date   HGBA1C 8.0 02/09/2017   HGBA1C 9.2 (H) 11/03/2016   HGBA1C 7.9 (H) 06/09/2016   A/P: suspect very poor control. CBGs over 400 without BID metformin- family has questions about compliance with meds in general- and want some additional support at home . We will see if care [facility] can help [place] here through [facility]- I think he is very high risk for hospitalization from hyperglycemia.   Metformin only causes one loose BM per day and trade off of being off is simply too high (CBGs over 400)especially as we struggle to regulate his sugars from lows to highs (as low as 68 and over 400 in last month)- I think his low appetite has been big trigger for swings to lows though- so we will come off wellbutrin in case it is causing this.

## 2017-05-25 NOTE — Assessment & Plan Note (Signed)
Patient has never followed through with capsule study that I can tell dating back to 2017. He has had anemia but on last check not present- we will get ferritin level with labs. Doubt the "everything tastes like salt" is a pica variant

## 2017-05-25 NOTE — Progress Notes (Signed)
Subjective:  William [name] is a 57 y.o. year old very pleasant male patient who presents for/with See problem oriented charting ROS- patient feels fatigued, has had very low appetite and everything tastes like salt over the last month. No fever or chills. No vomiting.    Past Medical History-  Patient Active Problem List   Diagnosis Date Noted  . Iron deficiency anemia 06/25/2015    Priority: High  . Tobacco use disorder 11/21/2013    Priority: High  . Diabetes mellitus, type 2 ([place]) 09/28/2006    Priority: High  . History of cardiovascular disorder 09/28/2006    Priority: High  . CKD (chronic kidney disease), stage III ([place]) 04/30/2014    Priority: Medium  . Hyperlipidemia 09/28/2006    Priority: Medium  . Essential hypertension 09/28/2006    Priority: Medium  . Depression, major, single episode, mild ([place]) 04/23/2017  . Inactivity 02/09/2017  . Claudication ([place]) 10/09/2015  . GERD (gastroesophageal reflux disease) 09/25/2013  . ONYCHOMYCOSIS, TOENAILS 03/22/2007    Medications- reviewed and updated Current Outpatient Medications  Medication Sig Dispense Refill  . amLODipine (NORVASC) 5 MG tablet Take 1 tablet (5 mg total) by mouth daily. 100 tablet 3  . aspirin 81 MG tablet Take 81 mg by mouth daily.      [name] atorvastatin (LIPITOR) 20 MG tablet TAKE 1 TABLET BY MOUTH  DAILY 90 tablet 3  . Blood Glucose Monitoring Suppl (ACCU-CHEK AVIVA PLUS) w/Device KIT Use device to check blood sugar three times a day 1 kit 0  . buPROPion (WELLBUTRIN XL) 150 MG 24 hr tablet Take 1 tablet (150 mg total) by mouth daily. 30 tablet 5  . ferrous sulfate 325 (65 FE) MG tablet Take 1 tablet (325 mg total) by mouth 2 (two) times daily with a meal. 200 tablet 3  . glucose blood (ACCU-CHEK AVIVA) test strip Use to check blood sugar three times a day E11.9 100 each 12  . glucose blood (ONE TOUCH ULTRA TEST) test strip CHECK BLOOD SUGAR 4 TIMES DAILY 400 each 3  . insulin glargine (LANTUS) 100  UNIT/ML injection INJECT 35-40 UNITS INTO THE SKIN ONCE DAILY AS LONG AS SUGAR ABOVE 90. 40 mL 3  . insulin regular (NOVOLIN R,HUMULIN R) 100 units/mL injection Meal time insulin. Take 5-10 units daily before biggest meal of day. Start with 5 units. Only increase if Dr. Yong Channel instructs you to. If too [facility]. 3 mL 5  . Insulin Syringe-Needle U-100 (B-D INS SYRINGE 0.5CC/31GX5/16) 31G X 5/16" 0.5 ML MISC Use to test blood sugars daily. Dx: E11.9 100 each 3  . labetalol (NORMODYNE) 200 MG tablet TAKE 1 TABLET BY MOUTH TWO  TIMES DAILY 180 tablet 1  . Lancets (ACCU-CHEK SOFT TOUCH) lancets Use as instructed 100 each 12  . losartan-hydrochlorothiazide (HYZAAR) 100-25 MG tablet TAKE 1 TABLET BY MOUTH  DAILY 90 tablet 1  . metFORMIN (GLUCOPHAGE) 1000 MG tablet TAKE 1 TABLET BY MOUTH TWO  TIMES DAILY WITH A MEAL 180 tablet 1  . omeprazole (PRILOSEC) 20 MG capsule TAKE 1 CAPSULE BY MOUTH  DAILY 90 capsule 1  . ONE TOUCH ULTRA TEST test strip TEST BLOOD SUGAR 3 TIMES A DAY 300 each 5   No current facility-administered medications for this visit.     Objective: BP 108/70 (BP Location: Left Arm, Patient Position: Sitting, Cuff Size: Large)   Pulse 77   Temp 97.6 F (36.4 C) (Oral)   Ht '5\' 7"'  (1.702 m)  Wt 164 lb 12.8 oz (74.8 kg)   SpO2 97%   BMI 25.81 kg/m  Gen: NAD, resting comfortably CV: RRR no murmurs rubs or gallops Lungs: CTAB no crackles, wheeze, rhonchi Abdomen: soft/nontender/nondistended/normal bowel sounds. Slightly overweight Ext: no edema Skin: warm, dry  Assessment/Plan:  Fatigue, weight loss S: patient reports fatigue and not feeling well. Weight is down 4 lbs in a month and now 8 lbs over last 2 months. Eating makes him sick- everything tastes like salt per his report and very low appetite. Has mild cough. Has mild ringing in ears- not pulsatile A/P: I am very concerned about patient with weight loss, fatigue, low appetite. Will start investigation  with cbc, cmp, tsh, a1c, psa. Will also get chest x-ray with smoking history. I have a VERY low threshold if we dont see improvement to scan CT abd/pelvis as well as chest. I am very concerned - patient seems drastically different than just a year or two ago. Very possible this is depression but need to rule out other organic causes   Diabetes mellitus, type 2 ([place]) S: poorly controlled on last check(we will set a1c goal at 8 given cognitive issues). I suspect a1c is even higher this visit. His meter shows 30 day average of 236. Does still have some #s into the 70s- note one to 68 in evening- states he ended up not feeling like eating.   On last check- We reduced lantus to 31 units from 33 last visit to help avoid hypoglycemia. Using 8 units regular insulin with largest meal of day.   He is also on metformin - now at 1026m twice a day. Diarrhea improved on once a day metformin but his blood sugars got up over 400 at times so they restarted this.    Lab Results  Component Value Date   HGBA1C 8.0 02/09/2017   HGBA1C 9.2 (H) 11/03/2016   HGBA1C 7.9 (H) 06/09/2016   A/P: suspect very poor control. CBGs over 400 without BID metformin- family has questions about compliance with meds in general- and want some additional support at home . [facility] [facility] I think he is very high risk for hospitalization from hyperglycemia.   Metformin only causes one loose BM per day and trade off of being off is simply too high (CBGs over 400)especially as we struggle to regulate his sugars from lows to highs (as low as 68 and over 400 in last month)- I think his low appetite has been big trigger for swings to lows though- so we will come off wellbutrin in case it is causing this.   Iron deficiency anemia Patient has never followed through with capsule study that I can tell dating back to 2017. He has had anemia but on last check not present- we will get ferritin level with  labs. Doubt the "everything tastes like salt" is a pica variant   Depression, major, single episode, mild (H[facility]) S: trial wellbutrin to see if helps with smoking cessation. Has only intermittently taken this- actually hard to know how many doses he has taken. Since he has started has reported low appetite and everything tasting like salt. Broke out crying this AM for no reason other than reporting not feeling well A/P: at first considered getting him to be more consistent. With medicaiton- but wonder if we can add to him feeling better by subtracting this medicine- in case the wellbutrin is causing the change in taste and nausea.  Follow up 1 week advised    Future Appointments  Date Time Provider [place]  06/01/2017  4:00 PM Marin Olp, MD [facility]   Lab/Order associations: Other fatigue - Plan: CBC, Comprehensive metabolic panel, TSH, DG Chest 2 View  Iron deficiency anemia, unspecified iron deficiency anemia type - Plan: Iron, TIBC and Ferritin Panel  Screening PSA (prostate specific antigen) - Plan: PSA  Type 2 diabetes mellitus with diabetic polyneuropathy, with long-term current use of insulin (HCC) - Plan: Hemoglobin A1c  Cough - Plan: DG Chest 2 View  Time Stamp The duration of face-to-face time during this visit was greater than 40 minutes. Greater than 50% of this time was spent in counseling, explanation of diagnosis, planning of further management, and/or coordination of care including discussion of potential causes of symptoms, navigating patient concerns as well as 2 family member concerns, reviewing blood sugars together and concerns, reviewing risks/benefits of differnet medications. .   Return precautions advised.  Garret Reddish, MD

## 2017-05-25 NOTE — Patient Instructions (Addendum)
Cut lisinopril-hctz pill in half. Follow up in 1 week (ok to use same day slot). May improve dizzines.  Roselyn Reef [facility]- to lower risk of hospitalization  Try to eat 3 meals a day. Continue 31 units of Lantus and then 8 units of regular insulin with largest meal. Continue metformin 1000mg  twice a day. Let us know about any sugars over 400 or under 70 immediately  At first I was thinking take wellbutrin consistently but considering symptoms are worse since he started this- lets hold off for now  I am considering scanning the chest and abdomen and pelvis if continues to feel this poorly and lose weight  Please stop by lab before you go

## 2017-05-26 LAB — IRON,TIBC AND FERRITIN PANEL
%SAT: 23 % (calc) (ref 15–60)
Ferritin: 60 ng/mL (ref 20–380)
IRON: 46 ug/dL — AB (ref 50–180)
TIBC: 199 mcg/dL (calc) — ABNORMAL LOW (ref 250–425)

## 2017-05-26 LAB — HEMOGLOBIN A1C: Hgb A1c MFr Bld: 7.9 % — ABNORMAL HIGH (ref 4.6–6.5)

## 2017-05-26 LAB — COMPREHENSIVE METABOLIC PANEL
ALBUMIN: 3.8 g/dL (ref 3.5–5.2)
ALT: 6 U/L (ref 0–53)
AST: 10 U/L (ref 0–37)
Alkaline Phosphatase: 106 U/L (ref 39–117)
BILIRUBIN TOTAL: 0.4 mg/dL (ref 0.2–1.2)
BUN: 21 mg/dL (ref 6–23)
CO2: 31 mEq/L (ref 19–32)
CREATININE: 1.62 mg/dL — AB (ref 0.40–1.50)
Calcium: 9.2 mg/dL (ref 8.4–10.5)
Chloride: 99 mEq/L (ref 96–112)
GFR: 46.64 mL/min — AB (ref >60.00)
Glucose, Bld: 181 mg/dL — ABNORMAL HIGH (ref 70–99)
Potassium: 3.9 mEq/L (ref 3.5–5.1)
Sodium: 139 mEq/L (ref 135–145)
Total Protein: 6.9 g/dL (ref 6.0–8.3)

## 2017-05-26 LAB — CBC
HCT: 41.7 % (ref 39.0–52.0)
HEMOGLOBIN: 14.1 g/dL (ref 13.0–17.0)
MCHC: 33.7 g/dL (ref 30.0–36.0)
MCV: 93.5 fl (ref 78.0–100.0)
PLATELETS: 288 10*3/uL (ref 150.0–400.0)
RBC: 4.47 Mil/uL (ref 4.22–5.81)
RDW: 13.4 % (ref 11.5–15.5)
WBC: 11 10*3/uL — ABNORMAL HIGH (ref 4.0–10.5)

## 2017-05-26 LAB — TSH: TSH: 3.17 u[IU]/mL (ref 0.35–4.50)

## 2017-05-26 LAB — PSA: PSA: 1.41 ng/mL (ref 0.10–4.00)

## 2017-05-28 ENCOUNTER — Telehealth: Payer: Self-pay | Admitting: Family Medicine

## 2017-05-28 NOTE — Telephone Encounter (Signed)
FYI - see message.  

## 2017-05-28 NOTE — Telephone Encounter (Signed)
Copied from [place]. Topic: General - Other >> May 28, 2017  3:33 PM Yvette Rack wrote: Reason for CRM: Roxanne from [facility] [phone] is calling to let Dr Yong Channel know that the patient was officially enrolled in [place] today and they will be seeing pt next week

## 2017-05-28 NOTE — Telephone Encounter (Signed)
Labs faxed after I called to confirm that William [name] is with Hospice

## 2017-05-28 NOTE — Telephone Encounter (Signed)
Please advise 

## 2017-05-28 NOTE — Telephone Encounter (Signed)
Great - thanks

## 2017-05-28 NOTE — Telephone Encounter (Signed)
Copied from [place]. Topic: Quick Communication - See Telephone Encounter >> May 28, 2017 12:11 PM Aurelio Brash B wrote: CRM for notification. See Telephone encounter for:  Lupita Dawn is requesting the pts lab results from ov on 2/5, she states she received the office notes for that visit but not lab results.  Her contact number is [phone] ext [phone],  fax number is [phone] 05/28/17.

## 2017-06-01 ENCOUNTER — Encounter: Payer: Self-pay | Admitting: Family Medicine

## 2017-06-01 ENCOUNTER — Ambulatory Visit: Payer: Medicare Other | Admitting: Family Medicine

## 2017-06-01 VITALS — BP 134/76 | HR 80 | Temp 98.0°F | Ht 67.0 in | Wt 170.4 lb

## 2017-06-01 DIAGNOSIS — F320 Major depressive disorder, single episode, mild: Secondary | ICD-10-CM | POA: Diagnosis not present

## 2017-06-01 DIAGNOSIS — Z794 Long term (current) use of insulin: Secondary | ICD-10-CM

## 2017-06-01 DIAGNOSIS — I10 Essential (primary) hypertension: Secondary | ICD-10-CM | POA: Diagnosis not present

## 2017-06-01 DIAGNOSIS — E1142 Type 2 diabetes mellitus with diabetic polyneuropathy: Secondary | ICD-10-CM | POA: Diagnosis not present

## 2017-06-01 MED ORDER — ESCITALOPRAM OXALATE 5 MG PO TABS: 5.0000 mg | ORAL_TABLET | Freq: Every day | ORAL | 3 refills | Status: DC

## 2017-06-01 NOTE — Patient Instructions (Addendum)
Start lexapro 5mg  or escitalopram 5mg   Need to eat 3 meals a day  See me in 2 months    Taking the medicine as directed and not missing any doses is one of the best things you can do to treat your depression.  Here are some things to keep in mind:  1) Side effects (stomach upset, some increased anxiety) may happen before you notice a benefit.  These side effects typically go away over time. 2) Changes to your dose of medicine or a change in medication all together is sometimes necessary 3) Most people need to be on medication at least 6-12 months 4) Many people will notice an improvement within two weeks but the full effect of the medication can take up to 4-6 weeks 5) Stopping the medication when you start feeling better often results in a return of symptoms 6) If you start having thoughts of hurting yourself or others after starting this medicine, call our office immediately at [phone] or seek care through 911.

## 2017-06-01 NOTE — Progress Notes (Signed)
Subjective:  William [name] is a 57 y.o. year old very pleasant male patient who presents for/with See problem oriented charting ROS- appetite much improved, no longer has salt taste in mouth, still appears to have low motivation. No SI. Frustration with family members noted.    Past Medical History-  Patient Active Problem List   Diagnosis Date Noted  . Iron deficiency anemia 06/25/2015    Priority: High  . Tobacco use disorder 11/21/2013    Priority: High  . Diabetes mellitus, type 2 ([place]) 09/28/2006    Priority: High  . History of cardiovascular disorder 09/28/2006    Priority: High  . CKD (chronic kidney disease), stage III ([place]) 04/30/2014    Priority: Medium  . Hyperlipidemia 09/28/2006    Priority: Medium  . Essential hypertension 09/28/2006    Priority: Medium  . Depression, major, single episode, mild ([place]) 04/23/2017  . Inactivity 02/09/2017  . Claudication ([place]) 10/09/2015  . GERD (gastroesophageal reflux disease) 09/25/2013  . ONYCHOMYCOSIS, TOENAILS 03/22/2007    Medications- reviewed and updated Current Outpatient Medications  Medication Sig Dispense Refill  . amLODipine (NORVASC) 5 MG tablet Take 1 tablet (5 mg total) by mouth daily. 100 tablet 3  . aspirin 81 MG tablet Take 81 mg by mouth daily.      [name] atorvastatin (LIPITOR) 20 MG tablet TAKE 1 TABLET BY MOUTH  DAILY 90 tablet 3  . Blood Glucose Monitoring Suppl (ACCU-CHEK AVIVA PLUS) w/Device KIT Use device to check blood sugar three times a day 1 kit 0  . buPROPion (WELLBUTRIN XL) 150 MG 24 hr tablet Take 1 tablet (150 mg total) by mouth daily. 30 tablet 5  . ferrous sulfate 325 (65 FE) MG tablet Take 1 tablet (325 mg total) by mouth 2 (two) times daily with a meal. 200 tablet 3  . glucose blood (ACCU-CHEK AVIVA) test strip Use to check blood sugar three times a day E11.9 100 each 12  . glucose blood (ONE TOUCH ULTRA TEST) test strip CHECK BLOOD SUGAR 4 TIMES DAILY 400 each 3  . insulin glargine (LANTUS)  100 UNIT/ML injection INJECT 35-40 UNITS INTO THE SKIN ONCE DAILY AS LONG AS SUGAR ABOVE 90. 40 mL 3  . insulin regular (NOVOLIN R,HUMULIN R) 100 units/mL injection Meal time insulin. Take 5-10 units daily before biggest meal of day. Start with 5 units. Only increase if Dr. Yong Channel instructs you to. If too [facility]. 3 mL 5  . Insulin Syringe-Needle U-100 (B-D INS SYRINGE 0.5CC/31GX5/16) 31G X 5/16" 0.5 ML MISC Use to test blood sugars daily. Dx: E11.9 100 each 3  . labetalol (NORMODYNE) 200 MG tablet TAKE 1 TABLET BY MOUTH TWO  TIMES DAILY 180 tablet 1  . Lancets (ACCU-CHEK SOFT TOUCH) lancets Use as instructed 100 each 12  . losartan-hydrochlorothiazide (HYZAAR) 100-25 MG tablet TAKE 1 TABLET BY MOUTH  DAILY 90 tablet 1  . metFORMIN (GLUCOPHAGE) 1000 MG tablet TAKE 1 TABLET BY MOUTH TWO  TIMES DAILY WITH A MEAL 180 tablet 1  . omeprazole (PRILOSEC) 20 MG capsule TAKE 1 CAPSULE BY MOUTH  DAILY 90 capsule 1  . ONE TOUCH ULTRA TEST test strip TEST BLOOD SUGAR 3 TIMES A DAY 300 each 5   No current facility-administered medications for this visit.     Objective: BP 134/76 (BP Location: Left Arm, Patient Position: Sitting, Cuff Size: Large)   Pulse 80   Temp 98 F (36.7 C) (Oral)   Ht _0  (1.702 m)  Wt 170 lb 6.4 oz (77.3 kg)   SpO2 98%   BMI 26.69 kg/m  Gen: NAD, resting comfortably  Assessment/Plan:   Essential hypertension S: controlled on losartan hctz  100-53m(half pill since last viist) BP Readings from Last 3 Encounters:  06/01/17 134/76  05/25/17 108/70  04/23/17 100/62  A/P: We discussed blood pressure goal of <140/90. Continue current meds:  Half doe of the losartan hctz for now.   Diabetes mellitus, type 2 ([place] S: reasonably controlled. On lantus 31 units and then 8 units of insulin with largest meal of the day. Prior had been on lantus 38 units but had multiple episodes hypoglycemia CBGs- Morning blood sugar 94. Last night was 220s about an hour  after meal, discussed checking 2 hours after meal Exercise and diet- not active- concern from family about depression Lab Results  Component Value Date   HGBA1C 7.9 (H) 05/25/2017   HGBA1C 8.0 02/09/2017   HGBA1C 9.2 (H) 11/03/2016    A/P: we will continue current medicines and recheck in 3 months- having far fewer low CBGs- will tolerate perhaps a1c of 8 or even 8.5 especially with him forgetting meals at time and placing him at risk for hypoglycemia. Diarrhea with metformin but seems to bring sugars down into 200smax instead of 400s   Depression, major, single episode, mild ([place] S: [facility] team came out to the house. Not helpful to him.   Patient/family update me since his golf cart broke down and he cant get around, cant afford to fix his activity levels have dropped and irritability increased. Lawnmoker broke so he cannot help others as much. With visual issues (seeing optho) hard to garden  Family very concerned because of Dirty living conditions, Doesn't change out foods, not exercising, skipping meals  In regards to weight loss. Weight 167.4 without boots - he gained 3 lbs in last week since being off metformin and salt taste in mouth and significant nausea resolved A/P: will try very low dose lexapro 578m Precautions per avs. Will see him back in 2 months or he can see one of my colleagues sooner if needed given my upcoming time out of office   Future Appointments  Date Time Provider De[place]4/15/2019  4:00 PM HuMarin OlpMD [facility]   Meds ordered this encounter  Medications  . escitalopram (LEXAPRO) 5 MG tablet    Sig: Take 1 tablet (5 mg total) by mouth daily.    Dispense:  30 tablet    Refill:  3   Time Stamp The duration of face-to-face time during this visit was greater than 25 minutes. Greater than 50% of this time was spent in counseling, explanation of diagnosis, planning of further management, and/or coordination of care including primarily  discussion on family dynamics between patient and people who help care for him, family concerns about him not eating and his memory issues  Return precautions advised.  StGarret ReddishMD

## 2017-06-02 NOTE — Assessment & Plan Note (Signed)
S: controlled on losartan hctz  100-25mg (half pill since last viist) BP Readings from Last 3 Encounters:  06/01/17 134/76  05/25/17 108/70  04/23/17 100/62  A/P: We discussed blood pressure goal of <140/90. Continue current meds:  Half doe of the losartan hctz for now.

## 2017-06-02 NOTE — Assessment & Plan Note (Signed)
S: [facility] team came out to the house. Not helpful to him.   Patient/family update me since his golf cart broke down and he cant get around, cant afford to fix his activity levels have dropped and irritability increased. Lawnmoker broke so he cannot help others as much. With visual issues (seeing optho) hard to garden  Family very concerned because of Dirty living conditions, Doesn't change out foods, not exercising, skipping meals  In regards to weight loss. Weight 167.4 without boots - he gained 3 lbs in last week since being off metformin and salt taste in mouth and significant nausea resolved A/P: will try very low dose lexapro 5mg . Precautions per avs. Will see him back in 2 months or he can see one of my colleagues sooner if needed given my upcoming time out of office

## 2017-06-02 NOTE — Assessment & Plan Note (Signed)
S: reasonably controlled. On lantus 31 units and then 8 units of insulin with largest meal of the day. Prior had been on lantus 38 units but had multiple episodes hypoglycemia CBGs- Morning blood sugar 94. Last night was 220s about an hour after meal, discussed checking 2 hours after meal Exercise and diet- not active- concern from family about depression Lab Results  Component Value Date   HGBA1C 7.9 (H) 05/25/2017   HGBA1C 8.0 02/09/2017   HGBA1C 9.2 (H) 11/03/2016    A/P: we will continue current medicines and recheck in 3 months- having far fewer low CBGs- will tolerate perhaps a1c of 8 or even 8.5 especially with him forgetting meals at time and placing him at risk for hypoglycemia. Diarrhea with metformin but seems to bring sugars down into 200smax instead of 400s

## 2017-06-03 ENCOUNTER — Encounter: Payer: Self-pay | Admitting: Family Medicine

## 2017-06-10 ENCOUNTER — Encounter: Payer: Self-pay | Admitting: Family Medicine

## 2017-06-10 ENCOUNTER — Other Ambulatory Visit: Payer: Self-pay

## 2017-06-10 MED ORDER — ESCITALOPRAM OXALATE 5 MG PO TABS: 5.0000 mg | ORAL_TABLET | Freq: Every day | ORAL | 3 refills | Status: DC

## 2017-07-02 ENCOUNTER — Encounter: Payer: Self-pay | Admitting: Family Medicine

## 2017-07-05 ENCOUNTER — Other Ambulatory Visit: Payer: Self-pay | Admitting: Surgical

## 2017-07-05 ENCOUNTER — Other Ambulatory Visit: Payer: Self-pay | Admitting: Family Medicine

## 2017-07-05 MED ORDER — ESCITALOPRAM OXALATE 5 MG PO TABS: 5.0000 mg | ORAL_TABLET | Freq: Every day | ORAL | 1 refills | Status: DC

## 2017-07-12 ENCOUNTER — Ambulatory Visit: Payer: Self-pay

## 2017-07-12 NOTE — Telephone Encounter (Signed)
Patient called in with c/o "high blood sugar." He says "it is 508." I asked when did he check it, he says "just before I called." I asked him what does it usually run in the evening, he says "300's." I asked does he take insulin and has he taken it today, he says "yes, I take it at night and 8 units regular with the biggest meal. I usually take it at 2 pm." I asked did he take it today at 2 pm, he says "yes." I asked about other symptoms, he denies. According to protocol, go to ED, care advice given, patient verbalized understanding. He says "I will call someone to come take me and it will be about 45 minutes before I can get there."   Reason for Disposition . Blood glucose > 500 mg/dl (27.5 mmol/l)  Answer Assessment - Initial Assessment Questions 1. BLOOD GLUCOSE: "What is your blood glucose level?"      508 2. ONSET: "When did you check the blood glucose?"     Just now checked it 3. USUAL RANGE: "What is your glucose level usually?" (e.g., usual fasting morning value, usual evening value)     Evening usually in the 300's 4. KETONES: "Do you check for ketones (urine or blood test strips)?" If yes, ask: "What does the test show now?"      No 5. TYPE 1 or 2:  "Do you know what type of diabetes you have?"  (e.g., Type 1, Type 2, Gestational; doesn't know)      Type 2 6. INSULIN: "Do you take insulin?" If yes, ask: "Have you missed any shots recently?"     Yes-haven't taken it yet today 7. DIABETES PILLS: "Do you take any pills for your diabetes?" If yes, ask: "Have you missed taking any pills recently?"     Yes, haven't missed any pills 8. OTHER SYMPTOMS: "Do you have any symptoms?" (e.g., fever, frequent urination, difficulty breathing, dizziness, weakness, vomiting)     No 9. PREGNANCY: "Is there any chance you are pregnant?" "When was your last menstrual period?"     N/A  Protocols used: DIABETES - HIGH BLOOD SUGAR-A-AH

## 2017-07-13 NOTE — Telephone Encounter (Signed)
See note

## 2017-07-13 NOTE — Telephone Encounter (Signed)
Jamie's note states that he needs to be seen (I agree)- Marcene Brawn can you please schedule him or have [facility] schedule him

## 2017-07-13 NOTE — Telephone Encounter (Signed)
Copied from [place] [id number]. Topic: Inquiry >> Jul 13, 2017 10:06 AM Arletha Grippe wrote: Reason for CRM: rachel smith called returning a call for [facility]. The call was disconnected. I tried to call back, it went to a business line.  Roselyn Reef said to put in a crm and she will call back

## 2017-07-13 NOTE — Telephone Encounter (Signed)
Called and spoke to William [name]. William [name] did not go to the ED last night. He can not drive. William [name] said she was aware of William [name] reporting a blood sugar over 500 and she kept tabs on William [name] and said it came down to to 195. I encouraged her that if he was not going to go to the ED that he needs to be seen in the office so we can assess his blood sugars and his meds. She verbalized understanding. She stated she has not been able to get in touch with William [name] this morning but she will go to his house and check on William [name]. She will have to arrange transportation.

## 2017-07-13 NOTE — Telephone Encounter (Signed)
Thank you Marcene Brawn!

## 2017-07-13 NOTE — Telephone Encounter (Signed)
Patient is scheduled for tomorrow.

## 2017-07-14 ENCOUNTER — Encounter: Payer: Self-pay | Admitting: Family Medicine

## 2017-07-14 ENCOUNTER — Ambulatory Visit: Payer: Medicare Other | Admitting: Family Medicine

## 2017-07-14 VITALS — BP 98/68 | HR 71 | Temp 97.6°F | Ht 67.0 in | Wt 169.8 lb

## 2017-07-14 DIAGNOSIS — Z794 Long term (current) use of insulin: Secondary | ICD-10-CM | POA: Diagnosis not present

## 2017-07-14 DIAGNOSIS — G3281 Cerebellar ataxia in diseases classified elsewhere: Secondary | ICD-10-CM

## 2017-07-14 DIAGNOSIS — R269 Unspecified abnormalities of gait and mobility: Secondary | ICD-10-CM

## 2017-07-14 DIAGNOSIS — E119 Type 2 diabetes mellitus without complications: Secondary | ICD-10-CM

## 2017-07-14 DIAGNOSIS — I10 Essential (primary) hypertension: Secondary | ICD-10-CM | POA: Diagnosis not present

## 2017-07-14 LAB — GLUCOSE, POCT (MANUAL RESULT ENTRY): POC GLUCOSE: 232 mg/dL — AB (ref 70–99)

## 2017-07-14 NOTE — Assessment & Plan Note (Signed)
Memory loss S: Family states he has had trouble walking over last year which they have not previously reported. They feel like his memory is somewhat worse and speech is slower. History of stroke and continues to smoke.   Has trouble walking and has had several falls at home. Seems to not remember quite as well.  A/P:  Given changes- worsening memory (low baseline IQ and illiterate even before prior stroke), gait where he is leaning to left- will get MR brain with and without contrast. GFR around 46 on last check but typically at least in 50s- think he can tolerate this- if GFR worsens may have to decrease metformin
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
## 2017-07-14 NOTE — Assessment & Plan Note (Signed)
S: reasonably controlled over prior 3 months based on a1c with goal <8 with cognitive issues. Last visit- instructed to continue lantus 31 units then use 8 units of insulin with largest meal of the day.   Also on metformin 1g BID in past- causes diarrhea though  Has at least 16 oz of coke a day unfortunately  CBGs- last visit varying from 94 to up to 220s and was avoiding hypoglycemia. Since then over last few days had # up to over 500- patient refused ED trip but did return today- sugars are back down in 200s at least now.   Patient seems to be confused about his doses. He states he is taking 35 units of lantus initially and I reminded him we discussed 31 at last visit at which point he switches and states he is taking 31. He claims he is taking insulin daily but family denies. He admits to 1 coke a day but family sees multiple multiple bottles and possible he is drinking more than that.  Lab Results  Component Value Date   HGBA1C 7.9 (H) 05/25/2017   HGBA1C 8.0 02/09/2017   HGBA1C 9.2 (H) 11/03/2016   A/P: from avs "Try to get all the cokes out of the home- you should be drinking water to help control your diabetes. Continue lantus 31 units with 8 units insulin before largest meal. I am tempted to stop the 8 unit insulin to simplify the regimen especially if sugars come down off coke"

## 2017-07-14 NOTE — Patient Instructions (Addendum)
Stop amlodipine 5mg .   Let's look for community resources as we are not sure that living alone is the best option right now with unsupervised medication taking 1. I would also like for you to sign up for an annual wellness visit on Friday April 5th at 10 or 11 AM with one of our nurses, Cassie or Manuela Schwartz, who both specialize in the annual wellness visit. This is a free benefit under medicare that may help [place] find additional ways to help you. Some highlights are reviewing medications, lifestyle, and doing a dementia screen.  2. Ask care connection about potential living arrangements 3. Last option is adult protective services Address: [address] [place], [place] [address] [address] [place], [place] [address]   [id number], [place] [address] State Courier #: [id number] Phone: [phone] Fax Number: [phone] Emergency Phone: [phone]   We will call you within a week or two about your referral for MRI brain. If you do not hear within 3 weeks, give [place] a call.   Try to get all the cokes out of the home- you should be drinking water to help control your diabetes. Continue lantus 31 units with 8 units insulin before largest meal. I am tempted to stop the 8 unit insulin to simplify the regimen especially if sugars come down off coke
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
## 2017-07-16 ENCOUNTER — Encounter: Payer: Self-pay | Admitting: Family Medicine

## 2017-07-24 ENCOUNTER — Inpatient Hospital Stay: Admission: RE | Admit: 2017-07-24 | Payer: Medicare Other | Source: Ambulatory Visit

## 2017-07-25 ENCOUNTER — Ambulatory Visit
Admission: RE | Admit: 2017-07-25 | Discharge: 2017-07-25 | Disposition: A | Discharge status: Home / Self Care | Payer: Medicare Other | Source: Ambulatory Visit | Attending: Family Medicine | Admitting: Family Medicine

## 2017-07-25 DIAGNOSIS — R269 Unspecified abnormalities of gait and mobility: Secondary | ICD-10-CM

## 2017-07-25 DIAGNOSIS — R413 Other amnesia: Secondary | ICD-10-CM | POA: Diagnosis not present

## 2017-07-25 DIAGNOSIS — G3281 Cerebellar ataxia in diseases classified elsewhere: Secondary | ICD-10-CM

## 2017-07-25 MED ORDER — GADOBENATE DIMEGLUMINE 529 MG/ML IV SOLN
16.0000 mL | Freq: Once | INTRAVENOUS | Status: AC | PRN
  Administered 2017-07-25: 16 mL via INTRAVENOUS

## 2017-07-26 ENCOUNTER — Encounter: Payer: Self-pay | Admitting: Family Medicine

## 2017-07-26 ENCOUNTER — Other Ambulatory Visit: Payer: Self-pay

## 2017-07-26 DIAGNOSIS — R413 Other amnesia: Secondary | ICD-10-CM

## 2017-07-26 MED ORDER — INSULIN GLARGINE 100 UNIT/ML ~~LOC~~ SOLN: SUBCUTANEOUS | 3 refills | Status: DC

## 2017-07-27 ENCOUNTER — Other Ambulatory Visit: Payer: Self-pay

## 2017-07-28 ENCOUNTER — Telehealth: Payer: Self-pay | Admitting: Family Medicine

## 2017-07-28 NOTE — Telephone Encounter (Signed)
See note.   Copied from [place] [id number]. Topic: General - Other >> Jul 28, 2017  9:25 AM Ivar Drape wrote: Reason for CRM:   Patient needs the MRI he had on 07/25/17 faxed to [facility]. Fax [phone].  It should include his name, date of birth and date of service.

## 2017-07-29 ENCOUNTER — Encounter: Payer: Self-pay | Admitting: Family Medicine

## 2017-07-29 ENCOUNTER — Other Ambulatory Visit: Payer: Self-pay | Admitting: Family Medicine

## 2017-07-30 ENCOUNTER — Ambulatory Visit: Payer: Medicare Other | Admitting: Family Medicine

## 2017-08-02 ENCOUNTER — Encounter: Payer: Self-pay | Admitting: Family Medicine

## 2017-08-02 ENCOUNTER — Ambulatory Visit: Payer: Medicare Other | Admitting: Family Medicine

## 2017-08-02 VITALS — BP 108/70 | HR 78 | Temp 97.4°F | Ht 67.0 in | Wt 168.4 lb

## 2017-08-02 DIAGNOSIS — E1165 Type 2 diabetes mellitus with hyperglycemia: Secondary | ICD-10-CM

## 2017-08-02 DIAGNOSIS — E785 Hyperlipidemia, unspecified: Secondary | ICD-10-CM | POA: Diagnosis not present

## 2017-08-02 DIAGNOSIS — I10 Essential (primary) hypertension: Secondary | ICD-10-CM | POA: Diagnosis not present

## 2017-08-02 DIAGNOSIS — Z794 Long term (current) use of insulin: Secondary | ICD-10-CM

## 2017-08-02 LAB — GLUCOSE, POCT (MANUAL RESULT ENTRY): POC Glucose: 331 mg/dl — AB (ref 70–99)

## 2017-08-02 NOTE — Assessment & Plan Note (Signed)
S: last visit we stopped amlodipine 5mg . He is still on losartan hctz 100-25mg  (half dose of full pill) and labetalol 200mg  BID. Feels more stable with standing and walking A/P: continue current regimen

## 2017-08-02 NOTE — Patient Instructions (Addendum)
Please let folks come into your house to see what ways we can support you better  Stop the insulin before meals.   Only take the lantus/long acting insulin. Start taking it in the mornings and only take 35 units. Skip dose tonight so you can take it in the morning. Do not take this if sugar is under 100- call us instead. You need to seek care if you are getting several 500 sugars in a row so the hospital can help stabilize your sugars  Lets try to get your blood sugar in 100-400 range.   Please write down all your blood sugars or at a very minimum your sugar each morning before your insulin.

## 2017-08-02 NOTE — Progress Notes (Signed)
Subjective:  William [name] is a 57 y.o. year old very pleasant male patient who presents for/with See problem oriented charting ROS- reports low blood sugar. No chest pain or shortness of breath. No edema.    Past Medical History-  Patient Active Problem List   Diagnosis Date Noted  . Iron deficiency anemia 06/25/2015    Priority: High  . Tobacco use disorder 11/21/2013    Priority: High  . Diabetes mellitus, type 2 ([place]) 09/28/2006    Priority: High  . History of cardiovascular disorder 09/28/2006    Priority: High  . CKD (chronic kidney disease), stage III ([place]) 04/30/2014    Priority: Medium  . Hyperlipidemia 09/28/2006    Priority: Medium  . Essential hypertension 09/28/2006    Priority: Medium  . Abnormal gait 07/14/2017  . Depression, major, single episode, mild ([place]) 04/23/2017  . Inactivity 02/09/2017  . Claudication ([place]) 10/09/2015  . GERD (gastroesophageal reflux disease) 09/25/2013  . ONYCHOMYCOSIS, TOENAILS 03/22/2007    Medications- reviewed and updated Current Outpatient Medications  Medication Sig Dispense Refill  . aspirin 81 MG tablet Take 81 mg by mouth daily.      [name] atorvastatin (LIPITOR) 20 MG tablet TAKE 1 TABLET BY MOUTH  DAILY 90 tablet 3  . Blood Glucose Monitoring Suppl (ACCU-CHEK AVIVA PLUS) w/Device KIT Use device to check blood sugar three times a day 1 kit 0  . buPROPion (WELLBUTRIN XL) 150 MG 24 hr tablet Take 1 tablet (150 mg total) by mouth daily. 30 tablet 5  . escitalopram (LEXAPRO) 5 MG tablet Take 1 tablet (5 mg total) by mouth daily. 90 tablet 1  . ferrous sulfate 325 (65 FE) MG tablet Take 1 tablet (325 mg total) by mouth 2 (two) times daily with a meal. 200 tablet 3  . glucose blood (ACCU-CHEK AVIVA) test strip Use to check blood sugar three times a day E11.9 100 each 12  . glucose blood (ONE TOUCH ULTRA TEST) test strip CHECK BLOOD SUGAR 4 TIMES DAILY 400 each 3  . insulin glargine (LANTUS) 100 UNIT/ML injection INJECT 35-40 UNITS  INTO THE SKIN ONCE DAILY AS LONG AS SUGAR ABOVE 90. 40 mL 3  . Insulin Syringe-Needle U-100 (B-D INS SYRINGE 0.5CC/31GX5/16) 31G X 5/16" 0.5 ML MISC Use to test blood sugars daily. Dx: E11.9 100 each 3  . labetalol (NORMODYNE) 200 MG tablet TAKE 1 TABLET BY MOUTH TWO  TIMES DAILY 180 tablet 1  . Lancets (ACCU-CHEK SOFT TOUCH) lancets Use as instructed 100 each 12  . losartan-hydrochlorothiazide (HYZAAR) 100-25 MG tablet TAKE 1 TABLET BY MOUTH  DAILY 90 tablet 1  . metFORMIN (GLUCOPHAGE) 1000 MG tablet TAKE 1 TABLET BY MOUTH TWO  TIMES DAILY WITH A MEAL 180 tablet 1  . omeprazole (PRILOSEC) 20 MG capsule TAKE 1 CAPSULE BY MOUTH  DAILY 90 capsule 1  . ONE TOUCH ULTRA TEST test strip TEST BLOOD SUGAR 3 TIMES A DAY 300 each 5   No current facility-administered medications for this visit.     Objective: BP 108/70 (BP Location: Left Arm, Patient Position: Sitting, Cuff Size: Large)   Pulse 78   Temp (!) 97.4 F (36.3 C) (Oral)   Ht '5\' 7"'  (1.702 m)   Wt 168 lb 6.4 oz (76.4 kg)   SpO2 96%   BMI 26.38 kg/m  Gen: NAD, resting comfortably. Seems able to focus on conversation more today than last visit CV: RRR no murmurs rubs or gallops Lungs: CTAB no crackles, wheeze, rhonchi Abdomen:  soft/nontender/nondistended/normal bowel sounds. Ext: no edema Skin: warm, dry  Assessment/Plan:  Other issues 1. Family called Adult protective services.  Patient declined nurse and social worker 2. Family remains very concerned he is taking medicine correctly and if he is a threat to himself due to this- even with family member filling pill boxes. Wonder if assisted living type living facility could be helpful  Diabetes mellitus, type 2 ([place]) S: last visit advised remove cokes from the home. Encouraged water intake. He was on lantus 31 units and 8 units insulin before largest meal. He was on metformin 1058m BID at that time as ewll.  No lows on this regimen at that time.   Today, family reports still  drinking several cokes. Had one low 54 in the morning- seems to wake up late some mornings and doesn't eat breakfast when he should  Suspect poor compliance with 3 meals a day as well as if he is taking insulin appropriately or at all at times- we will try to simplify regimen A/P:Go back to 35 units lantus only- change this dose to the morning. Do not take it if your sugar is under 100 in the morning- [place]. No mealtime insulin anymore. I would rather let CBGs slide up some than cause lows- I really do suspect compliance is main reason for swings from 50s to 500s- not the prescribed regimen intself  Essential hypertension S: last visit we stopped amlodipine 583m He is still on losartan hctz 100-2541mhalf dose of full pill) and labetalol 200m58mD. Feels more stable with standing and walking A/P: continue current regimen  Hyperlipidemia S: well controlled on atorvastatin 20mg74mh LDL 60 last july A/P: continue current rx    3 week follow up Future Appointments  Date Time Provider [place]  4:00 PM [facility]Marin [facility]   Lab/Order associations: Type 2 diabetes mellitus with hyperglycemia, with long-term current use of insulin (HCC) [place]: POCT Glucose (CBG). CBG 331 today showing his poor control.   Return precautions advised.  StephGarret Reddish

## 2017-08-02 NOTE — Assessment & Plan Note (Signed)
S: well controlled on atorvastatin 20mg  with LDL 60 last july A/P: continue current rx

## 2017-08-02 NOTE — Assessment & Plan Note (Signed)
S: last visit advised remove cokes from the home. Encouraged water intake. He was on lantus 31 units and 8 units insulin before largest meal. He was on metformin 1000mg  BID at that time as ewll.  No lows on this regimen at that time.   Today, family reports still drinking several cokes. Had one low 54 in the morning- seems to wake up late some mornings and doesn't eat breakfast when he should  Suspect poor compliance with 3 meals a day as well as if he is taking insulin appropriately or at all at times- we will try to simplify regimen A/P:Go back to 35 units lantus only- change this dose to the morning. Do not take it if your sugar is under 100 in the morning- call us instead. No mealtime insulin anymore. I would rather let CBGs slide up some than cause lows- I really do suspect compliance is main reason for swings from 50s to 500s- not the prescribed regimen intself

## 2017-08-13 ENCOUNTER — Encounter: Payer: Self-pay | Admitting: Family Medicine

## 2017-08-16 ENCOUNTER — Other Ambulatory Visit: Payer: Self-pay

## 2017-08-16 DIAGNOSIS — I639 Cerebral infarction, unspecified: Secondary | ICD-10-CM

## 2017-08-16 DIAGNOSIS — F320 Major depressive disorder, single episode, mild: Secondary | ICD-10-CM

## 2017-08-18 ENCOUNTER — Inpatient Hospital Stay (HOSPITAL_COMMUNITY)
Admission: EM | Admit: 2017-08-18 | Discharge: 2017-08-23 | DRG: 064 | Disposition: A | Discharge status: Skilled Nursing Facility | Payer: Medicare Other | Attending: Internal Medicine | Admitting: Internal Medicine

## 2017-08-18 DIAGNOSIS — R402362 Coma scale, best motor response, obeys commands, at arrival to emergency department: Secondary | ICD-10-CM | POA: Diagnosis not present

## 2017-08-18 DIAGNOSIS — I639 Cerebral infarction, unspecified: Secondary | ICD-10-CM | POA: Diagnosis not present

## 2017-08-18 DIAGNOSIS — G9511 Acute infarction of spinal cord (embolic) (nonembolic): Secondary | ICD-10-CM | POA: Diagnosis present

## 2017-08-18 DIAGNOSIS — W19XXXA Unspecified fall, initial encounter: Secondary | ICD-10-CM | POA: Diagnosis present

## 2017-08-18 DIAGNOSIS — Z79899 Other long term (current) drug therapy: Secondary | ICD-10-CM | POA: Diagnosis not present

## 2017-08-18 DIAGNOSIS — R159 Full incontinence of feces: Secondary | ICD-10-CM | POA: Diagnosis present

## 2017-08-18 DIAGNOSIS — R531 Weakness: Secondary | ICD-10-CM | POA: Diagnosis not present

## 2017-08-18 DIAGNOSIS — R402252 Coma scale, best verbal response, oriented, at arrival to emergency department: Secondary | ICD-10-CM | POA: Diagnosis present

## 2017-08-18 DIAGNOSIS — N183 Chronic kidney disease, stage 3 unspecified: Secondary | ICD-10-CM | POA: Diagnosis present

## 2017-08-18 DIAGNOSIS — Z823 Family history of stroke: Secondary | ICD-10-CM

## 2017-08-18 DIAGNOSIS — Z794 Long term (current) use of insulin: Secondary | ICD-10-CM

## 2017-08-18 DIAGNOSIS — R29700 NIHSS score 0: Secondary | ICD-10-CM | POA: Diagnosis not present

## 2017-08-18 DIAGNOSIS — E1142 Type 2 diabetes mellitus with diabetic polyneuropathy: Secondary | ICD-10-CM | POA: Diagnosis present

## 2017-08-18 DIAGNOSIS — F819 Developmental disorder of scholastic skills, unspecified: Secondary | ICD-10-CM | POA: Diagnosis present

## 2017-08-18 DIAGNOSIS — R402441 Other coma, without documented Glasgow coma scale score, or with partial score reported, in the field [EMT or ambulance]: Secondary | ICD-10-CM | POA: Diagnosis not present

## 2017-08-18 DIAGNOSIS — E1122 Type 2 diabetes mellitus with diabetic chronic kidney disease: Secondary | ICD-10-CM | POA: Diagnosis present

## 2017-08-18 DIAGNOSIS — F320 Major depressive disorder, single episode, mild: Secondary | ICD-10-CM | POA: Diagnosis present

## 2017-08-18 DIAGNOSIS — I10 Essential (primary) hypertension: Secondary | ICD-10-CM | POA: Diagnosis not present

## 2017-08-18 DIAGNOSIS — F17200 Nicotine dependence, unspecified, uncomplicated: Secondary | ICD-10-CM | POA: Diagnosis present

## 2017-08-18 DIAGNOSIS — R32 Unspecified urinary incontinence: Secondary | ICD-10-CM | POA: Diagnosis present

## 2017-08-18 DIAGNOSIS — E785 Hyperlipidemia, unspecified: Secondary | ICD-10-CM | POA: Diagnosis not present

## 2017-08-18 DIAGNOSIS — I69354 Hemiplegia and hemiparesis following cerebral infarction affecting left non-dominant side: Secondary | ICD-10-CM

## 2017-08-18 DIAGNOSIS — R402142 Coma scale, eyes open, spontaneous, at arrival to emergency department: Secondary | ICD-10-CM | POA: Diagnosis present

## 2017-08-18 DIAGNOSIS — Z7982 Long term (current) use of aspirin: Secondary | ICD-10-CM

## 2017-08-18 DIAGNOSIS — Z833 Family history of diabetes mellitus: Secondary | ICD-10-CM

## 2017-08-18 DIAGNOSIS — R296 Repeated falls: Secondary | ICD-10-CM | POA: Diagnosis present

## 2017-08-18 DIAGNOSIS — Z66 Do not resuscitate: Secondary | ICD-10-CM | POA: Diagnosis present

## 2017-08-18 DIAGNOSIS — K219 Gastro-esophageal reflux disease without esophagitis: Secondary | ICD-10-CM | POA: Diagnosis not present

## 2017-08-18 DIAGNOSIS — G950 Syringomyelia and syringobulbia: Secondary | ICD-10-CM | POA: Diagnosis not present

## 2017-08-18 DIAGNOSIS — W19XXXD Unspecified fall, subsequent encounter: Secondary | ICD-10-CM | POA: Diagnosis not present

## 2017-08-18 DIAGNOSIS — E1129 Type 2 diabetes mellitus with other diabetic kidney complication: Secondary | ICD-10-CM | POA: Diagnosis present

## 2017-08-18 DIAGNOSIS — Z882 Allergy status to sulfonamides status: Secondary | ICD-10-CM

## 2017-08-18 DIAGNOSIS — F17210 Nicotine dependence, cigarettes, uncomplicated: Secondary | ICD-10-CM | POA: Diagnosis present

## 2017-08-18 DIAGNOSIS — E1151 Type 2 diabetes mellitus with diabetic peripheral angiopathy without gangrene: Secondary | ICD-10-CM | POA: Diagnosis present

## 2017-08-18 DIAGNOSIS — E119 Type 2 diabetes mellitus without complications: Secondary | ICD-10-CM | POA: Diagnosis not present

## 2017-08-18 DIAGNOSIS — I6389 Other cerebral infarction: Secondary | ICD-10-CM | POA: Diagnosis not present

## 2017-08-18 DIAGNOSIS — R269 Unspecified abnormalities of gait and mobility: Secondary | ICD-10-CM

## 2017-08-18 HISTORY — DX: Developmental disorder of scholastic skills, unspecified: F81.9

## 2017-08-18 LAB — BASIC METABOLIC PANEL
Anion gap: 10 (ref 5–15)
BUN: 30 mg/dL — AB (ref 6–20)
CHLORIDE: 102 mmol/L (ref 101–111)
CO2: 28 mmol/L (ref 22–32)
CREATININE: 1.71 mg/dL — AB (ref 0.61–1.24)
Calcium: 9.4 mg/dL (ref 8.9–10.3)
GFR calc Af Amer: 49 mL/min — ABNORMAL LOW (ref >60)
GFR calc non Af Amer: 42 mL/min — ABNORMAL LOW (ref >60)
Glucose, Bld: 86 mg/dL (ref 65–99)
Potassium: 3.9 mmol/L (ref 3.5–5.1)
Sodium: 140 mmol/L (ref 135–145)

## 2017-08-18 LAB — CBC
HEMATOCRIT: 43.8 % (ref 39.0–52.0)
Hemoglobin: 15.3 g/dL (ref 13.0–17.0)
MCH: 32.4 pg (ref 26.0–34.0)
MCHC: 34.9 g/dL (ref 30.0–36.0)
MCV: 92.8 fL (ref 78.0–100.0)
PLATELETS: 238 10*3/uL (ref 150–400)
RBC: 4.72 MIL/uL (ref 4.22–5.81)
RDW: 13.3 % (ref 11.5–15.5)
WBC: 13.1 10*3/uL — ABNORMAL HIGH (ref 4.0–10.5)

## 2017-08-18 LAB — URINALYSIS, ROUTINE W REFLEX MICROSCOPIC
Bilirubin Urine: NEGATIVE
Glucose, UA: 50 mg/dL — AB
Hgb urine dipstick: NEGATIVE
KETONES UR: NEGATIVE mg/dL
Leukocytes, UA: NEGATIVE
Nitrite: NEGATIVE
PROTEIN: 100 mg/dL — AB
SPECIFIC GRAVITY, URINE: 1.024 (ref 1.005–1.030)
pH: 5 (ref 5.0–8.0)

## 2017-08-18 LAB — CBG MONITORING, ED: GLUCOSE-CAPILLARY: 77 mg/dL (ref 65–99)

## 2017-08-18 NOTE — ED Triage Notes (Addendum)
Per EMS- pt has hx of stroke with residual weakness. Today the pt had a fall when his left knee went out. No loss of consciousness. No neck or back pain. He is alert and oriented to X4. CBG 95. EKG normal. 20G to L FA. Not on blood thinners, did not hit his head. States he landed on "my ass". Only has pain to buttocks. Denies knee pain. States the left knee has never given out on him before. Pt reports weakness off and on for 3 weeks after the doctor changed his insulin.

## 2017-08-19 ENCOUNTER — Encounter: Payer: Self-pay | Admitting: Family Medicine

## 2017-08-19 ENCOUNTER — Telehealth: Payer: Self-pay | Admitting: Family Medicine

## 2017-08-19 ENCOUNTER — Inpatient Hospital Stay (HOSPITAL_COMMUNITY): Payer: Medicare Other

## 2017-08-19 ENCOUNTER — Encounter (HOSPITAL_COMMUNITY): Payer: Self-pay | Admitting: Internal Medicine

## 2017-08-19 DIAGNOSIS — F819 Developmental disorder of scholastic skills, unspecified: Secondary | ICD-10-CM | POA: Diagnosis present

## 2017-08-19 DIAGNOSIS — F17200 Nicotine dependence, unspecified, uncomplicated: Secondary | ICD-10-CM | POA: Diagnosis not present

## 2017-08-19 DIAGNOSIS — R32 Unspecified urinary incontinence: Secondary | ICD-10-CM | POA: Diagnosis present

## 2017-08-19 DIAGNOSIS — I10 Essential (primary) hypertension: Secondary | ICD-10-CM

## 2017-08-19 DIAGNOSIS — W19XXXD Unspecified fall, subsequent encounter: Secondary | ICD-10-CM | POA: Diagnosis not present

## 2017-08-19 DIAGNOSIS — Z7982 Long term (current) use of aspirin: Secondary | ICD-10-CM | POA: Diagnosis not present

## 2017-08-19 DIAGNOSIS — E119 Type 2 diabetes mellitus without complications: Secondary | ICD-10-CM | POA: Diagnosis not present

## 2017-08-19 DIAGNOSIS — M6281 Muscle weakness (generalized): Secondary | ICD-10-CM | POA: Diagnosis not present

## 2017-08-19 DIAGNOSIS — I69354 Hemiplegia and hemiparesis following cerebral infarction affecting left non-dominant side: Secondary | ICD-10-CM | POA: Diagnosis not present

## 2017-08-19 DIAGNOSIS — I6389 Other cerebral infarction: Secondary | ICD-10-CM | POA: Diagnosis not present

## 2017-08-19 DIAGNOSIS — Z9181 History of falling: Secondary | ICD-10-CM | POA: Diagnosis not present

## 2017-08-19 DIAGNOSIS — F320 Major depressive disorder, single episode, mild: Secondary | ICD-10-CM

## 2017-08-19 DIAGNOSIS — R159 Full incontinence of feces: Secondary | ICD-10-CM | POA: Diagnosis present

## 2017-08-19 DIAGNOSIS — N183 Chronic kidney disease, stage 3 (moderate): Secondary | ICD-10-CM | POA: Diagnosis not present

## 2017-08-19 DIAGNOSIS — G950 Syringomyelia and syringobulbia: Secondary | ICD-10-CM | POA: Diagnosis not present

## 2017-08-19 DIAGNOSIS — R402252 Coma scale, best verbal response, oriented, at arrival to emergency department: Secondary | ICD-10-CM | POA: Diagnosis present

## 2017-08-19 DIAGNOSIS — E1142 Type 2 diabetes mellitus with diabetic polyneuropathy: Secondary | ICD-10-CM | POA: Diagnosis present

## 2017-08-19 DIAGNOSIS — R2689 Other abnormalities of gait and mobility: Secondary | ICD-10-CM | POA: Diagnosis not present

## 2017-08-19 DIAGNOSIS — R402142 Coma scale, eyes open, spontaneous, at arrival to emergency department: Secondary | ICD-10-CM | POA: Diagnosis present

## 2017-08-19 DIAGNOSIS — R488 Other symbolic dysfunctions: Secondary | ICD-10-CM | POA: Diagnosis not present

## 2017-08-19 DIAGNOSIS — W19XXXA Unspecified fall, initial encounter: Secondary | ICD-10-CM | POA: Diagnosis present

## 2017-08-19 DIAGNOSIS — I361 Nonrheumatic tricuspid (valve) insufficiency: Secondary | ICD-10-CM | POA: Diagnosis not present

## 2017-08-19 DIAGNOSIS — Z743 Need for continuous supervision: Secondary | ICD-10-CM | POA: Diagnosis not present

## 2017-08-19 DIAGNOSIS — R29700 NIHSS score 0: Secondary | ICD-10-CM | POA: Diagnosis present

## 2017-08-19 DIAGNOSIS — I639 Cerebral infarction, unspecified: Principal | ICD-10-CM

## 2017-08-19 DIAGNOSIS — E1151 Type 2 diabetes mellitus with diabetic peripheral angiopathy without gangrene: Secondary | ICD-10-CM | POA: Diagnosis present

## 2017-08-19 DIAGNOSIS — Z79899 Other long term (current) drug therapy: Secondary | ICD-10-CM | POA: Diagnosis not present

## 2017-08-19 DIAGNOSIS — E785 Hyperlipidemia, unspecified: Secondary | ICD-10-CM | POA: Diagnosis not present

## 2017-08-19 DIAGNOSIS — E1122 Type 2 diabetes mellitus with diabetic chronic kidney disease: Secondary | ICD-10-CM | POA: Diagnosis present

## 2017-08-19 DIAGNOSIS — R296 Repeated falls: Secondary | ICD-10-CM | POA: Insufficient documentation

## 2017-08-19 DIAGNOSIS — K219 Gastro-esophageal reflux disease without esophagitis: Secondary | ICD-10-CM

## 2017-08-19 DIAGNOSIS — Z794 Long term (current) use of insulin: Secondary | ICD-10-CM | POA: Diagnosis not present

## 2017-08-19 DIAGNOSIS — F17210 Nicotine dependence, cigarettes, uncomplicated: Secondary | ICD-10-CM | POA: Diagnosis present

## 2017-08-19 DIAGNOSIS — Z882 Allergy status to sulfonamides status: Secondary | ICD-10-CM | POA: Diagnosis not present

## 2017-08-19 DIAGNOSIS — G9511 Acute infarction of spinal cord (embolic) (nonembolic): Secondary | ICD-10-CM | POA: Diagnosis present

## 2017-08-19 DIAGNOSIS — R279 Unspecified lack of coordination: Secondary | ICD-10-CM | POA: Diagnosis not present

## 2017-08-19 DIAGNOSIS — R270 Ataxia, unspecified: Secondary | ICD-10-CM | POA: Diagnosis not present

## 2017-08-19 DIAGNOSIS — R402362 Coma scale, best motor response, obeys commands, at arrival to emergency department: Secondary | ICD-10-CM | POA: Diagnosis present

## 2017-08-19 DIAGNOSIS — Z66 Do not resuscitate: Secondary | ICD-10-CM | POA: Diagnosis present

## 2017-08-19 LAB — GLUCOSE, CAPILLARY
Glucose-Capillary: 325 mg/dL — ABNORMAL HIGH (ref 65–99)
Glucose-Capillary: 267 mg/dL — ABNORMAL HIGH (ref 65–99)

## 2017-08-19 LAB — CBG MONITORING, ED
GLUCOSE-CAPILLARY: 164 mg/dL — AB (ref 65–99)
Glucose-Capillary: 332 mg/dL — ABNORMAL HIGH (ref 65–99)

## 2017-08-19 LAB — BASIC METABOLIC PANEL
Anion gap: 10 (ref 5–15)
BUN: 32 mg/dL — AB (ref 6–20)
CO2: 27 mmol/L (ref 22–32)
CREATININE: 1.86 mg/dL — AB (ref 0.61–1.24)
Calcium: 8.7 mg/dL — ABNORMAL LOW (ref 8.9–10.3)
Chloride: 104 mmol/L (ref 101–111)
GFR calc Af Amer: 44 mL/min — ABNORMAL LOW (ref >60)
GFR, EST NON AFRICAN AMERICAN: 38 mL/min — AB (ref >60)
Glucose, Bld: 72 mg/dL (ref 65–99)
Potassium: 3.5 mmol/L (ref 3.5–5.1)
SODIUM: 141 mmol/L (ref 135–145)

## 2017-08-19 LAB — CBC
HCT: 39.3 % (ref 39.0–52.0)
Hemoglobin: 13.4 g/dL (ref 13.0–17.0)
MCH: 31.7 pg (ref 26.0–34.0)
MCHC: 34.1 g/dL (ref 30.0–36.0)
MCV: 92.9 fL (ref 78.0–100.0)
Platelets: 196 10*3/uL (ref 150–400)
RBC: 4.23 MIL/uL (ref 4.22–5.81)
RDW: 13.2 % (ref 11.5–15.5)
WBC: 11.8 10*3/uL — AB (ref 4.0–10.5)

## 2017-08-19 LAB — HEMOGLOBIN A1C
HEMOGLOBIN A1C: 9.2 % — AB (ref 4.8–5.6)
MEAN PLASMA GLUCOSE: 217.34 mg/dL

## 2017-08-19 LAB — HIV ANTIBODY (ROUTINE TESTING W REFLEX): HIV SCREEN 4TH GENERATION: NONREACTIVE

## 2017-08-19 LAB — FOLATE: FOLATE: 15 ng/mL (ref >5.9)

## 2017-08-19 LAB — TSH: TSH: 4.327 u[IU]/mL (ref 0.350–4.500)

## 2017-08-19 LAB — CK: CK TOTAL: 63 U/L (ref 49–397)

## 2017-08-19 LAB — VITAMIN B12: Vitamin B-12: 517 pg/mL (ref 180–914)

## 2017-08-19 LAB — CREATININE, URINE, RANDOM: Creatinine, Urine: 220.12 mg/dL

## 2017-08-19 LAB — RPR: RPR: NONREACTIVE

## 2017-08-19 MED ORDER — ONDANSETRON HCL 4 MG/2ML IJ SOLN: 4.0000 mg | Freq: Four times a day (QID) | INTRAMUSCULAR | Status: DC | PRN

## 2017-08-19 MED ORDER — ASPIRIN 81 MG PO CHEW
81.0000 mg | CHEWABLE_TABLET | Freq: Every day | ORAL | Status: DC
  Administered 2017-08-19: 81 mg via ORAL
  Filled 2017-08-19: qty 1

## 2017-08-19 MED ORDER — BUPROPION HCL ER (XL) 150 MG PO TB24
150.0000 mg | ORAL_TABLET | Freq: Every day | ORAL | Status: DC
  Administered 2017-08-19 – 2017-08-23 (×5): 150 mg via ORAL
  Filled 2017-08-19 (×5): qty 1

## 2017-08-19 MED ORDER — GADOBENATE DIMEGLUMINE 529 MG/ML IV SOLN
15.0000 mL | Freq: Once | INTRAVENOUS | Status: AC
  Administered 2017-08-19: 15 mL via INTRAVENOUS

## 2017-08-19 MED ORDER — ZOLPIDEM TARTRATE 5 MG PO TABS: 5.0000 mg | ORAL_TABLET | Freq: Every evening | ORAL | Status: DC | PRN

## 2017-08-19 MED ORDER — ESCITALOPRAM OXALATE 10 MG PO TABS
5.0000 mg | ORAL_TABLET | Freq: Every day | ORAL | Status: DC
  Administered 2017-08-19 – 2017-08-23 (×5): 5 mg via ORAL
  Filled 2017-08-19 (×5): qty 1

## 2017-08-19 MED ORDER — LABETALOL HCL 100 MG PO TABS
200.0000 mg | ORAL_TABLET | Freq: Two times a day (BID) | ORAL | Status: DC
  Administered 2017-08-19 – 2017-08-23 (×9): 200 mg via ORAL
  Filled 2017-08-19 (×8): qty 2
  Filled 2017-08-19: qty 1

## 2017-08-19 MED ORDER — ACETAMINOPHEN 650 MG RE SUPP: 650.0000 mg | Freq: Four times a day (QID) | RECTAL | Status: DC | PRN

## 2017-08-19 MED ORDER — NICOTINE 21 MG/24HR TD PT24
21.0000 mg | MEDICATED_PATCH | Freq: Every day | TRANSDERMAL | Status: DC
  Administered 2017-08-19 – 2017-08-23 (×5): 21 mg via TRANSDERMAL
  Filled 2017-08-19 (×5): qty 1

## 2017-08-19 MED ORDER — ENOXAPARIN SODIUM 40 MG/0.4ML ~~LOC~~ SOLN
40.0000 mg | SUBCUTANEOUS | Status: DC
  Administered 2017-08-19: 40 mg via SUBCUTANEOUS
  Filled 2017-08-19: qty 0.4

## 2017-08-19 MED ORDER — PANTOPRAZOLE SODIUM 40 MG PO TBEC
40.0000 mg | DELAYED_RELEASE_TABLET | Freq: Every day | ORAL | Status: DC
  Administered 2017-08-19 – 2017-08-23 (×5): 40 mg via ORAL
  Filled 2017-08-19 (×5): qty 1

## 2017-08-19 MED ORDER — FERROUS SULFATE 325 (65 FE) MG PO TABS
325.0000 mg | ORAL_TABLET | Freq: Two times a day (BID) | ORAL | Status: DC
  Administered 2017-08-19 – 2017-08-23 (×8): 325 mg via ORAL
  Filled 2017-08-19 (×8): qty 1

## 2017-08-19 MED ORDER — ONDANSETRON HCL 4 MG PO TABS
4.0000 mg | ORAL_TABLET | Freq: Four times a day (QID) | ORAL | Status: DC | PRN
  Administered 2017-08-20: 4 mg via ORAL
  Filled 2017-08-19: qty 1

## 2017-08-19 MED ORDER — INSULIN GLARGINE 100 UNIT/ML ~~LOC~~ SOLN
18.0000 [IU] | Freq: Every day | SUBCUTANEOUS | Status: DC
  Administered 2017-08-19 – 2017-08-20 (×2): 18 [IU] via SUBCUTANEOUS
  Filled 2017-08-19 (×2): qty 0.18

## 2017-08-19 MED ORDER — AMLODIPINE BESYLATE 5 MG PO TABS
5.0000 mg | ORAL_TABLET | Freq: Every day | ORAL | Status: DC
  Administered 2017-08-19 – 2017-08-23 (×5): 5 mg via ORAL
  Filled 2017-08-19 (×5): qty 1

## 2017-08-19 MED ORDER — ASPIRIN 325 MG PO TABS
325.0000 mg | ORAL_TABLET | Freq: Every day | ORAL | Status: DC
  Administered 2017-08-20 – 2017-08-23 (×4): 325 mg via ORAL
  Filled 2017-08-19 (×4): qty 1

## 2017-08-19 MED ORDER — ACETAMINOPHEN 325 MG PO TABS
650.0000 mg | ORAL_TABLET | Freq: Four times a day (QID) | ORAL | Status: DC | PRN
  Administered 2017-08-19 – 2017-08-23 (×4): 650 mg via ORAL
  Filled 2017-08-19 (×5): qty 2

## 2017-08-19 MED ORDER — INSULIN ASPART 100 UNIT/ML ~~LOC~~ SOLN
0.0000 [IU] | Freq: Three times a day (TID) | SUBCUTANEOUS | Status: DC
  Administered 2017-08-19: 7 [IU] via SUBCUTANEOUS
  Administered 2017-08-19 – 2017-08-20 (×3): 3 [IU] via SUBCUTANEOUS
  Administered 2017-08-20: 5 [IU] via SUBCUTANEOUS
  Administered 2017-08-21: 3 [IU] via SUBCUTANEOUS
  Administered 2017-08-21: 2 [IU] via SUBCUTANEOUS
  Administered 2017-08-21: 5 [IU] via SUBCUTANEOUS
  Administered 2017-08-22: 3 [IU] via SUBCUTANEOUS
  Administered 2017-08-22: 5 [IU] via SUBCUTANEOUS
  Administered 2017-08-22: 2 [IU] via SUBCUTANEOUS
  Administered 2017-08-23: 5 [IU] via SUBCUTANEOUS
  Administered 2017-08-23: 2 [IU] via SUBCUTANEOUS
  Filled 2017-08-19: qty 1

## 2017-08-19 MED ORDER — ATORVASTATIN CALCIUM 20 MG PO TABS
20.0000 mg | ORAL_TABLET | Freq: Every day | ORAL | Status: DC
  Administered 2017-08-19 – 2017-08-22 (×4): 20 mg via ORAL
  Filled 2017-08-19 (×4): qty 1

## 2017-08-19 MED ORDER — HYDRALAZINE HCL 20 MG/ML IJ SOLN: 5.0000 mg | INTRAMUSCULAR | Status: DC | PRN

## 2017-08-19 MED ORDER — SODIUM CHLORIDE 0.9 % IV BOLUS
1000.0000 mL | Freq: Once | INTRAVENOUS | Status: AC
  Administered 2017-08-19: 1000 mL via INTRAVENOUS

## 2017-08-19 NOTE — ED Provider Notes (Signed)
TIME SEEN: 12:59 AM  CHIEF COMPLAINT: Fall  HPI: Patient is a 57 year old male with history of hypertension, hyperlipidemia, diabetes, previous stroke with residual left-sided weakness who presents to the emergency department after a fall.  Patient states that his left leg gave out on him and he slid down onto his bottom.  He did not hit his head or lose consciousness.  Was unable to get off the ground and states he stayed there for 4 hours.  He denies any pain at this time.  No headache, neck or back pain.  Denies numbness, tingling or focal weakness that is new for him.  Dates he has had tingling in both legs for "a long time".  No fevers, cough, vomiting or diarrhea.  ROS: See HPI Constitutional: no fever  Eyes: no drainage  ENT: no runny nose   Cardiovascular:  no chest pain  Resp: no SOB  GI: no vomiting GU: no dysuria Integumentary: no rash  Allergy: no hives  Musculoskeletal: no leg swelling  Neurological: no slurred speech ROS otherwise negative  PAST MEDICAL HISTORY/PAST SURGICAL HISTORY:  Past Medical History:  Diagnosis Date  . CEREBROVASCULAR ACCIDENT, HX OF 09/28/2006   2007 affected left side and peripheral vision  . DIABETES MELLITUS, TYPE II 09/28/2006  . GERD (gastroesophageal reflux disease)   . HYPERLIPIDEMIA 09/28/2006  . HYPERTENSION 09/28/2006  . Stroke [facility]) years ago    MEDICATIONS:  Prior to Admission medications   Medication Sig Start Date End Date Taking? Authorizing Provider  amLODipine (NORVASC) 5 MG tablet Take 5 mg by mouth daily.   Yes [provider]  aspirin 81 MG tablet Take 81 mg by mouth daily.     Yes [provider]  atorvastatin (LIPITOR) 20 MG tablet TAKE 1 TABLET BY MOUTH  DAILY 04/22/17  Yes Marin Olp, MD  buPROPion (WELLBUTRIN XL) 150 MG 24 hr tablet Take 1 tablet (150 mg total) by mouth daily. 04/23/17  Yes Marin Olp, MD  escitalopram (LEXAPRO) 5 MG tablet Take 1 tablet (5 mg total) by mouth daily. 07/05/17   Yes Briscoe Deutscher, DO  ferrous sulfate 325 (65 FE) MG tablet Take 1 tablet (325 mg total) by mouth 2 (two) times daily with a meal. 03/24/17  Yes Marin Olp, MD  insulin glargine (LANTUS) 100 UNIT/ML injection INJECT 35-40 UNITS INTO THE SKIN ONCE DAILY AS LONG AS SUGAR ABOVE 90. 07/26/17  Yes Marin Olp, MD  labetalol (NORMODYNE) 200 MG tablet TAKE 1 TABLET BY MOUTH TWO  TIMES DAILY 07/29/17  Yes Marin Olp, MD  losartan-hydrochlorothiazide (HYZAAR) 100-25 MG tablet TAKE 1 TABLET BY MOUTH  DAILY Patient taking differently: Take 0.5 tablets by mouth daily.  07/29/17  Yes Marin Olp, MD  metFORMIN (GLUCOPHAGE) 1000 MG tablet TAKE 1 TABLET BY MOUTH TWO  TIMES DAILY WITH A MEAL 07/29/17  Yes Marin Olp, MD  omeprazole (PRILOSEC) 20 MG capsule TAKE 1 CAPSULE BY MOUTH  DAILY 07/29/17  Yes Marin Olp, MD  Blood Glucose Monitoring Suppl (ACCU-CHEK AVIVA PLUS) w/Device KIT Use device to check blood sugar three times a day Patient not taking: Reported on 08/18/2017 02/07/16   Marin Olp, MD  ONE TOUCH ULTRA TEST test strip TEST BLOOD SUGAR 3 TIMES A DAY Patient not taking: Reported on 08/18/2017 10/20/16   Marin Olp, MD    ALLERGIES:  Allergies  Allergen Reactions  . Sulfamethoxazole     REACTION: rash    SOCIAL HISTORY:  Social History   Tobacco Use  . Smoking status: Current Every Day Smoker    Packs/day: 1.00    Types: Cigarettes  . Smokeless tobacco: Former User  Substance Use Topics  . Alcohol use: No    Alcohol/week: 0.0 oz    FAMILY HISTORY: Family History  Problem Relation Age of Onset  . Lung cancer Mother   . Diabetes Father   . Stroke Father   . Alcohol abuse Brother   . Diabetes Brother   . Heart attack Sister   . Colon cancer Neg Hx     EXAM: BP 111/67   Pulse 72   Temp 98.1 F (36.7 C) (Oral)   Resp 20   Ht 5' 8" (1.727 m)   Wt 84.8 kg (187 lb)   SpO2 99%   BMI 28.43 kg/m  CONSTITUTIONAL: Alert and oriented and  responds appropriately to questions. Well-appearing; well-nourished; GCS 15, patient has a childlike demeanor likely from his previous stroke HEAD: Normocephalic; atraumatic EYES: Conjunctivae clear, PERRL, EOMI ENT: normal nose; no rhinorrhea; moist mucous membranes; pharynx without lesions noted; no dental injury; no septal hematoma NECK: Supple, no meningismus, no LAD; no midline spinal tenderness, step-off or deformity; trachea midline CARD: RRR; S1 and S2 appreciated; no murmurs, no clicks, no rubs, no gallops RESP: Normal chest excursion without splinting or tachypnea; breath sounds clear and equal bilaterally; no wheezes, no rhonchi, no rales; no hypoxia or respiratory distress CHEST:  chest wall stable, no crepitus or ecchymosis or deformity, nontender to palpation; no flail chest ABD/GI: Normal bowel sounds; non-distended; soft, non-tender, no rebound, no guarding; no ecchymosis or other lesions noted PELVIS:  stable, nontender to palpation BACK:  The back appears normal and is non-tender to palpation, there is no CVA tenderness; no midline spinal tenderness, step-off or deformity EXT: Normal ROM in all joints; non-tender to palpation; no edema; normal capillary refill; no cyanosis, no bony tenderness or bony deformity of patient's extremities, no joint effusion, compartments are soft, extremities are warm and well-perfused, no ecchymosis SKIN: Normal color for age and race; warm NEURO: Moves all extremities equally, childlike demeanor, sensation to light touch intact diffusely other than reports of some tingling in bilateral lower extremities which she reports is chronic, strength 5/5 in all 4 extremities, cranial nerves II through XII intact, normal speech PSYCH: The patient's mood and manner are appropriate. Grooming and personal hygiene are appropriate.  MEDICAL DECISION MAKING: Patient here after fall.  Reports that his left leg gave out on him.  He is neurologically intact currently.   Hemodynamically stable.  No signs of trauma on examination and he denies any pain.  Labs show mild leukocytosis which may be reactive.  He has no recent infectious symptoms.  His creatinine is mildly elevated from baseline but he has a normal CK.  Will hydrate patient.  ED PROGRESS: Patient sister now at bedside.  She provides much more detailed history.  She reports over the past few weeks patient has been falling frequently which is abnormal for him.  She states he has had an ataxic gait.  She reports that his primary care physician Dr. Hunter has been trying to help set them up with a skilled nursing facility, rehab facility as they do not feel he is safe to be by himself.  She states that he is also been incontinent of urine recently.  Patient denies any back pain at this time.  I do feel he probably needs an MRI of his brain and   his thoracic and lumbar spine although I feel like cauda equina, spinal stenosis is less likely.  Concern for possible NPH, stroke.  His sister is very concerned about taking him home he would like for him to be admitted to the hospital.  Will discuss with hospitalist as I feel this is not unreasonable plan to admit for observation.  2:32 AM Discussed patient's case with hospitalist, Dr. Niu.  I have recommended admission and patient (and family if present) agree with this plan. Admitting physician will place admission orders.   I reviewed all nursing notes, vitals, pertinent previous records, EKGs, lab and urine results, imaging (as available).     EKG Interpretation  Date/Time:  Thursday Aug 19 2017 03:22:45 EDT Ventricular Rate:  69 PR Interval:    QRS Duration: 96 QT Interval:  371 QTC Calculation: 398 R Axis:   9 Text Interpretation:  Sinus rhythm Anterior infarct, old Abnormal T, consider ischemia, lateral leads Artifact in lead(s) I III aVR aVL V2 V3 V5 V6 No significant change since last tracing Confirmed by ,  ([id number]) on 08/19/2017 5:01:51 AM          ,  N, DO 08/19/17 0502  

## 2017-08-19 NOTE — ED Notes (Signed)
Neurologist at bedside. 

## 2017-08-19 NOTE — Progress Notes (Signed)
Preliminary note--Bilateral carotid duplex study completed. Bilateral vertebral arteries antegrade flow.  Calcification noted in bilateral CCA distal and ICA prox.   I-39% hemodynamic stenosis bilaterally according to the velocity classifications.  William [name] (RDMS RVT) 08/19/17 4:48 PM

## 2017-08-19 NOTE — ED Notes (Signed)
MD Niu at bedside  

## 2017-08-19 NOTE — Progress Notes (Signed)
CSW consulted for SNF placement. CSW went to speak with pt a bedside however pt in MRI at this time. CSW will follow up later to assess for further needs of pt.     Virgie Dad Casi Westerfeld, MSW, [place] [facility] Clinical Social Worker [phone]

## 2017-08-19 NOTE — Social Work (Addendum)
CSW spoke with pt sister William [name] at bedside, pt sister appeared stressed and tearful during [place] visit. Pt sister states that she cannot take care of pt, and again verbalized that pt cant take care of himself.   Pt sister states that she was informed by an MD that the social work dept would be able to expedite process for Medicaid. CSW explained that at current time this writer is not aware of any ability to speed up the process of Medicaid as it is handled through the DSS.  CSW explained that this Probation officer could support with information about applying for Medicaid and that while pt and pt sister began that process this writer could support with SNF placement as pt likely needs therapeutic services at this time.  CSW explained that [employer] would need to authorize services for pt to discharge to SNF and explained this funding would largely be for a short term placement. Pt sister stated "are you just going to kick him out on the street after 20 days? Are you telling me I am going to have to quit my job?"  CSW expressed understanding that this must be frustrating for pt and pt sister to have pt dealing with frequent falls.   CSW will continue to follow, await PT/OT recommendations. Will provide pt sister with information for [place] to contact them regarding Medicaid. This Probation officer has also contacted  William [name] [phone] APS worker now that pt has moved to inpatient floor.  William [name]- [phone] [facility]) William [name]- [phone]  3:30pm- When CSW returned to room, pt and pt sister had visitor, they also had a friend named William [name] on the phone (who per pt sister is Arts administrator at [facility]). CSW again explained limitations of pt placement without Medicaid and limitations of this CSW from getting Medicaid expedited. CSW encouraged pt sister to call DSS and see if APS worker had been supporting Medicaid application or if she needed to begin the process while pt is in  hospital. Pt friends encouraged pt sister to also begin this process and to take some time off of work to get things in order for pt. CSW will continue to follow.   William [name], [place] Work [phone]

## 2017-08-19 NOTE — H&P (Signed)
History and Physical    [name] HCW:[id number] DOB: August 06, 1960 DOA: 08/18/2017  Referring MD/NP/PA:   PCP: Marin Olp, MD   Patient coming from:  The patient is coming from home.  At baseline, pt is independent for most of ADL.  Chief Complaint: Frequent fall  HPI: William [name] is a 58 y.o. male with medical history significant of learning disability from childhood, hypertension, hyperlipidemia, diabetes mellitus, stroke with left-sided weakness and gait instability, GERD, depression, tobacco abuse, CKD-3, who presents with frequent fall.  Per patient's sister, he has been having multiple falls recently, no significant injury.  He has generalized weakness and chronic left-sided weakness from previous stroke.  Patient also has bilateral leg tingling. No unilateral weakness in extremities. No facial droop or slurred speech.  Patient has urinary incontinence.  Recently had diarrhea, which has resolved.  No nausea, vomiting or abdominal pain.  No symptoms of UTI.  Patient denies any chest pain, cough, shortness breath, fever or chills.  [name] asked to give her a call at [phone] and also call pt's PCP, Dr. Garret Reddish tomorrow.   ED Course: pt was found to have WBC 13.1, negative urinalysis, CK 63, slightly worsening renal function, temperature normal, no tachycardia, no tachypnea, oxygen saturation 91% on room air.  Patient is admitted to telemetry bed as inpatient.  Likely need SNF placement.  Review of Systems:   General: no fevers, chills, no body weight gain,  has fatigue HEENT: no blurry vision, hearing changes or sore throat Respiratory: no dyspnea, coughing, wheezing CV: no chest pain, no palpitations GI: no nausea, vomiting, abdominal pain, diarrhea, constipation GU: no dysuria, burning on urination, increased urinary frequency, hematuria  Ext: no leg edema Neuro: no unilateral weakness,  no vision change or hearing loss. Has fall and leg  tingling. Skin: no rash, no skin tear. MSK: No muscle spasm, no deformity, no limitation of range of movement in spin Heme: No easy bruising.  Travel history: No recent long distant travel.  Allergy:  Allergies  Allergen Reactions  . Sulfamethoxazole     REACTION: rash    Past Medical History:  Diagnosis Date  . CEREBROVASCULAR ACCIDENT, HX OF 09/28/2006   2007 affected left side and peripheral vision  . DIABETES MELLITUS, TYPE II 09/28/2006  . GERD (gastroesophageal reflux disease)   . HYPERLIPIDEMIA 09/28/2006  . HYPERTENSION 09/28/2006  . Learning disability   . Stroke [facility]) years ago    Past Surgical History:  Procedure Laterality Date  . COLONOSCOPY    . LYMPHADENECTOMY    . RHINOPLASTY    . SIGMOIDOSCOPY      Social History:  reports that he has been smoking cigarettes.  He has been smoking about 1.00 pack per day. He has quit using smokeless tobacco. He reports that he does not drink alcohol or use drugs.  Family History:  Family History  Problem Relation Age of Onset  . Lung cancer Mother   . Diabetes Father   . Stroke Father   . Alcohol abuse Brother   . Diabetes Brother   . Heart attack Sister   . Colon cancer Neg Hx      Prior to Admission medications   Medication Sig Start Date End Date Taking? Authorizing Provider  amLODipine (NORVASC) 5 MG tablet Take 5 mg by mouth daily.   Yes [provider]  aspirin 81 MG tablet Take 81 mg by mouth daily.     Yes [provider]  atorvastatin (LIPITOR)  20 MG tablet TAKE 1 TABLET BY MOUTH  DAILY 04/22/17  Yes Marin Olp, MD  buPROPion (WELLBUTRIN XL) 150 MG 24 hr tablet Take 1 tablet (150 mg total) by mouth daily. 04/23/17  Yes Marin Olp, MD  escitalopram (LEXAPRO) 5 MG tablet Take 1 tablet (5 mg total) by mouth daily. 07/05/17  Yes Briscoe Deutscher, DO  ferrous sulfate 325 (65 FE) MG tablet Take 1 tablet (325 mg total) by mouth 2 (two) times daily with a meal. 03/24/17  Yes Marin Olp,  MD  insulin glargine (LANTUS) 100 UNIT/ML injection INJECT 35-40 UNITS INTO THE SKIN ONCE DAILY AS LONG AS SUGAR ABOVE 90. 07/26/17  Yes Marin Olp, MD  labetalol (NORMODYNE) 200 MG tablet TAKE 1 TABLET BY MOUTH TWO  TIMES DAILY 07/29/17  Yes Marin Olp, MD  losartan-hydrochlorothiazide (HYZAAR) 100-25 MG tablet TAKE 1 TABLET BY MOUTH  DAILY Patient taking differently: Take 0.5 tablets by mouth daily.  07/29/17  Yes Marin Olp, MD  metFORMIN (GLUCOPHAGE) 1000 MG tablet TAKE 1 TABLET BY MOUTH TWO  TIMES DAILY WITH A MEAL 07/29/17  Yes Marin Olp, MD  omeprazole (PRILOSEC) 20 MG capsule TAKE 1 CAPSULE BY MOUTH  DAILY 07/29/17  Yes Marin Olp, MD  Blood Glucose Monitoring Suppl (ACCU-CHEK AVIVA PLUS) w/Device KIT Use device to check blood sugar three times a day Patient not taking: Reported on 08/18/2017 02/07/16   Marin Olp, MD  ONE TOUCH ULTRA TEST test strip TEST BLOOD SUGAR 3 TIMES A DAY Patient not taking: Reported on 08/18/2017 10/20/16   Marin Olp, MD    Physical Exam: Vitals:   08/19/17 0515 08/19/17 0530 08/19/17 0545 08/19/17 0600  BP: (!) 113/93 (!) 165/80 (!) 168/84 (!) 173/148  Pulse: 70 73 71 71  Resp:      Temp:      TempSrc:      SpO2: 98% 100% 99% 99%  Weight:      Height:       General: Not in acute distress HEENT:       Eyes: PERRL, EOMI, no scleral icterus.       ENT: No discharge from the ears and nose, no pharynx injection, no tonsillar enlargement.        Neck: No JVD, no bruit, no mass felt. Heme: No neck lymph node enlargement. Cardiac: S1/S2, RRR, No murmurs, No gallops or rubs. Respiratory: No rales, wheezing, rhonchi or rubs. GI: Soft, nondistended, nontender, no rebound pain, no organomegaly, BS present. GU: No hematuria Ext: No pitting leg edema bilaterally. 2+DP/PT pulse bilaterally. Musculoskeletal: No joint deformities, No joint redness or warmth, no limitation of ROM in spin. Skin: No rashes.  Neuro: Alert,  oriented to place and person, but not to time, cranial nerves II-XII grossly intact, has left side weakness. Psych: Patient is not psychotic.  Labs on Admission: I have personally reviewed following labs and imaging studies  CBC: Recent Labs  Lab 08/18/17 1917 08/19/17 0319  WBC 13.1* 11.8*  HGB 15.3 13.4  HCT 43.8 39.3  MCV 92.8 92.9  PLT 238 295   Basic Metabolic Panel: Recent Labs  Lab 08/18/17 1917 08/19/17 0319  NA 140 141  K 3.9 3.5  CL 102 104  CO2 28 27  GLUCOSE 86 72  BUN 30* 32*  CREATININE 1.71* 1.86*  CALCIUM 9.4 8.7*   GFR: Estimated Creatinine Clearance: 45.9 mL/min (A) (by C-G formula based on SCr of 1.86 mg/dL (H)). Liver  Function Tests: No results for input(s): AST, ALT, ALKPHOS, BILITOT, PROT, ALBUMIN in the last 168 hours. No results for input(s): LIPASE, AMYLASE in the last 168 hours. No results for input(s): AMMONIA in the last 168 hours. Coagulation Profile: No results for input(s): INR, PROTIME in the last 168 hours. Cardiac Enzymes: Recent Labs  Lab 08/18/17 0126  CKTOTAL 63   BNP (last 3 results) No results for input(s): PROBNP in the last 8760 hours. HbA1C: No results for input(s): HGBA1C in the last 72 hours. CBG: Recent Labs  Lab 08/18/17 1910  GLUCAP 77   Lipid Profile: No results for input(s): CHOL, HDL, LDLCALC, TRIG, CHOLHDL, LDLDIRECT in the last 72 hours. Thyroid Function Tests: Recent Labs    08/19/17 0319  TSH 4.327   Anemia Panel: Recent Labs    08/19/17 0319  VITAMINB12 517  FOLATE 15.0   Urine analysis:    Component Value Date/Time   COLORURINE YELLOW 08/18/2017 1903   APPEARANCEUR HAZY (A) 08/18/2017 1903   LABSPEC 1.024 08/18/2017 1903   PHURINE 5.0 08/18/2017 1903   GLUCOSEU 50 (A) 08/18/2017 1903   HGBUR NEGATIVE 08/18/2017 1903   BILIRUBINUR NEGATIVE 08/18/2017 1903   KETONESUR NEGATIVE 08/18/2017 1903   PROTEINUR 100 (A) 08/18/2017 1903   UROBILINOGEN 0.2 09/19/2010 0945   NITRITE NEGATIVE  08/18/2017 1903   LEUKOCYTESUR NEGATIVE 08/18/2017 1903   Sepsis Labs: '@LABRCNTIP' (procalcitonin:4,lacticidven:4) )No results found for this or any previous visit (from the past 240 hour(s)).   Radiological Exams on Admission: No results found.   EKG: Independently reviewed.  Sinus rhythm, anteroseptal infarction pattern, mild T wave inversion in lead I/aVL.  Assessment/Plan Principal Problem:   Fall Active Problems:   Type II diabetes mellitus with renal manifestations (HCC)   Hyperlipidemia   Essential hypertension   GERD (gastroesophageal reflux disease)   Tobacco use disorder   CKD (chronic kidney disease), stage III (HCC)   Depression, major, single episode, mild (HCC)   Stroke (cerebrum) (HCC)   Urinary incontinence   Fall and leg tingling, urinary incontinence: Etiology is not clear.  CT head is negative.  Differential diagnosis include NPH, stroke, peripheral neuropathy, spinal cord compression.  -will admit to tele bed (pt likely need SNF placement) -MRI-brain, T- and L-spin -Vb12, folate, RPR and TSH -PT/OT -CM and SW consult -please call his sister and PCP in AM.  Diabetes mellitus with renal complication ([place]):  Last A1c 7.9 on 05/25/17, not well controled. Patient is taking Lantus, metformin at home -will decrease Lantus dose from 35 to 40 units at 18 units daily -SSI  HLD: -lipitor  HTN:  -Continue home medications: Amlodipine, labetalol  -hold Hyzaar due to worsening renal function -IV hydralazine prn  GERD: -Protonix  Tobacco abuse: -Did counseling about importance of quitting smoking -Nicotine patch  CKD (chronic kidney disease), stage III ([place]): Slightly worsened in the baseline. -Hold Baseline creatinine 1.3, his creatinine is 1.71, BUN 30, Hyzaar - IV fluid: 1 L normal saline -check FeUrea  Depression: Stable, no suicidal or homicidal ideations. -Continue home medications: Lexapro, Wellbutrin,  Stroke (cerebrum) ([place]):  -asa and  lipitor  Urinary incontinence: -f/u MRI  DVT ppx:  SQ Lovenox Code Status:  DNR (I discussed with patient's in the presence of his sister, and explained the meaning of CODE STATUS. Patient would want to be DNR) Family Communication:  Yes, patient's sister  at bed side Disposition Plan:  To be determined Consults called:  none Admission status: Inpatient/tele     Date of  Service 08/19/2017    Ivor Costa [facility] Pager [phone]  If 7PM-7AM, please contact night-coverage [email or web address] Password [email or web address] 08/19/2017, 7:23 AM

## 2017-08-19 NOTE — Plan of Care (Signed)
  Problem: Activity: Goal: Risk for activity intolerance will decrease Outcome: Progressing   Problem: Pain Managment: Goal: General experience of comfort will improve Outcome: Progressing   

## 2017-08-19 NOTE — Clinical Social Work Note (Signed)
Clinical Social Work Assessment  Patient Details  Name: William [name] MRN: [id number] Date of Birth: 1960-08-08  Date of referral:  08/19/17               Reason for consult:  Facility Placement                Permission sought to share information with:  Family Supports Permission granted to share information::  Yes, Verbal Permission Granted  Name::     [name]   Agency::  family  Relationship::   sister   Contact Information:  [name] [phone]  Housing/Transportation Living arrangements for the past 2 months:  Apartment(alone.) Source of Information:  Patient Patient Interpreter Needed:  None Criminal Activity/Legal Involvement Pertinent to Current Situation/Hospitalization:  No - Comment as needed Significant Relationships:  Friend, Other Family Members Lives with:  Self Do you feel safe going back to the place where you live?  Yes Need for family participation in patient care:  Yes (Comment)  Care giving concerns:  CSW spoke with pt at bedside. At this time pt denies having any concerns to CSW.    Social Worker assessment / plan:  CSW spoke with pt at bedside. During this time CSW was informed that pt is from home alone. Pt expressed that pt has lived alone for all of pt's life. Pt reports that pt does have support from a friend names Archie as well as from sister [name]. Pt expressed being agreeable to SNF placement at the time of discharge if needed.   During this assessment pt was sitting upright in bed eating breakfast. Pt was in good spirits and verbalized understanding of information that was being presented to pt during this time.   Employment status:  Disabled (Comment on whether or not currently receiving Disability) Insurance information:  Managed Medicare PT Recommendations:  Not assessed at this time Information / Referral to community resources:  Skilled Nursing Facility(spoke with pt about SNF placemetnt options at the time of discharge.  )  Patient/Family's Response to care:  Pt's response to care appropriate for diagnosis give at this time. Pt verbalized understanding and is hoping to get back to baseline level of functioning with the help of rehab.   Patient/Family's Understanding of and Emotional Response to Diagnosis, Current Treatment, and Prognosis:  No further questions or concerns have been presented to CSW at this time. Pt's emotional response was laughing and smiling while talking to CSW and eating breakfast. Understanding of care to pt appeared clear to CSW as pt verbalized that pt understood care plan at this time.   Emotional Assessment Appearance:  Appears stated age Attitude/Demeanor/Rapport:  Engaged Affect (typically observed):  Appropriate, Pleasant Orientation:  Oriented to Self, Oriented to Place, Oriented to  Time, Oriented to Situation Alcohol / Substance use:  Not Applicable Psych involvement (Current and /or in the community):  No (Comment)  Discharge Needs  Concerns to be addressed:  Denies Needs/Concerns at this time Readmission within the last 30 days:  No Current discharge risk:  Dependent with Mobility, Lives alone Barriers to Discharge:  Continued Medical Work up   [employer], [place] 08/19/2017, 9:46 AM

## 2017-08-19 NOTE — ED Notes (Signed)
ED Provider at bedside. 

## 2017-08-19 NOTE — Telephone Encounter (Unsigned)
Copied from [place] [phone]. Topic: Quick Communication - See Telephone Encounter >> Aug 19, 2017  3:35 PM Percell Belt A wrote: CRM for notification. See Telephone encounter for: 08/19/17. Pt wife called and she was upset.  Stated that the pt Is in the hosp.  She said that she is needing some kind of help to take care of him but she has to work and can not.  She said that the hosp was not helping her with those resources and she would like to know what is her next step to getting some help.     Please contact her at [phone]

## 2017-08-19 NOTE — ED Notes (Signed)
Social work attempted to meet with patient while pt in MRI, [place] will return around 0930 after meetings

## 2017-08-19 NOTE — Consult Note (Addendum)
Requesting Physician: Dr. Broadus John    Chief Complaint: Stroke/difficulty with gait/abnormal signal in the thoracic cord  History obtained from: Sister  HPI:                                                                                                                                         William [name] is an 57 y.o. male with history of hypertension, hyperlipidemia, diabetes, CVA.  Partially patient does not live with a sister that she is limited history to give.  Patient is brought to the hospital today because he has had multiple falls over the last few days.  His sister is the only person and give a history as the patient has a learning disability and cannot give a good history.  Per sister he has been having multiple falls for good 6 months with becoming worse.  He is also been urinary incontinent for 6 months possibly longer.  Patient also has had stool incontinence for 6 months if not longer.  This was not a sudden onset but a gradual onset.  This is per patient and sister.  When asking the patient if he can feel when he needs to go to the bathroom he states yes but there has been also times where he will have urinated on himself and did not know this.  Sister also explains that there have been times a been shopping when he urinated on himself and did not know.  Unfortunately when questioning the patient he is unable to give a reliable history.  Sister does not live with patient patient lives with other family members and his medications and he is cared for by his other siblings along with his sister.  Today patient was brought to the hospital secondary to his multiple falls.  MRI of the brain was obtained showing a punctate infarct  Abnormal upper thoracic spinal cord signal affecting a 4.5 cm segment from T2-T3 to T4-T5. Central cord involvement with borderline to mild cord expansion. No enhancement.  Date last known well: Unable to determine Time last known well: Unable to determine tPA  Given: No: No last known normal Modified Rankin: Rankin Score=3    Past Medical History:  Diagnosis Date  . CEREBROVASCULAR ACCIDENT, HX OF 09/28/2006   2007 affected left side and peripheral vision  . DIABETES MELLITUS, TYPE II 09/28/2006  . GERD (gastroesophageal reflux disease)   . HYPERLIPIDEMIA 09/28/2006  . HYPERTENSION 09/28/2006  . Learning disability   . Stroke [facility]) years ago    Past Surgical History:  Procedure Laterality Date  . COLONOSCOPY    . LYMPHADENECTOMY    . RHINOPLASTY    . SIGMOIDOSCOPY      Family History  Problem Relation Age of Onset  . Lung cancer Mother   . Diabetes Father   . Stroke Father   . Alcohol abuse Brother   . Diabetes Brother   .  Heart attack Sister   . Colon cancer Neg Hx    Social History:  reports that he has been smoking cigarettes.  He has been smoking about 1.00 pack per day. He has quit using smokeless tobacco. He reports that he does not drink alcohol or use drugs.  Allergies:  Allergies  Allergen Reactions  . Sulfamethoxazole     REACTION: rash    Medications:                                                                                                                           Current Facility-Administered Medications  Medication Dose Route Frequency Provider Last Rate Last Dose  . acetaminophen (TYLENOL) tablet 650 mg  650 mg Oral Q6H PRN Ivor Costa, MD       Or  . acetaminophen (TYLENOL) suppository 650 mg  650 mg Rectal Q6H PRN Ivor Costa, MD      . amLODipine (NORVASC) tablet 5 mg  5 mg Oral Daily Ivor Costa, MD   5 mg at 08/19/17 0913  . aspirin tablet 325 mg  325 mg Oral Daily Domenic Polite, MD      . atorvastatin (LIPITOR) tablet 20 mg  20 mg Oral Daily Ivor Costa, MD      . buPROPion (WELLBUTRIN XL) 24 hr tablet 150 mg  150 mg Oral Daily Ivor Costa, MD   150 mg at 08/19/17 0913  . enoxaparin (LOVENOX) injection 40 mg  40 mg Subcutaneous Q24H Ivor Costa, MD   40 mg at 08/19/17 0537  . escitalopram (LEXAPRO)  tablet 5 mg  5 mg Oral Daily Ivor Costa, MD   5 mg at 08/19/17 0913  . ferrous sulfate tablet 325 mg  325 mg Oral BID WC Ivor Costa, MD      . hydrALAZINE (APRESOLINE) injection 5 mg  5 mg Intravenous Q2H PRN Ivor Costa, MD      . insulin aspart (novoLOG) injection 0-9 Units  0-9 Units Subcutaneous TID WC Ivor Costa, MD   3 Units at 08/19/17 [id number]  . insulin glargine (LANTUS) injection 18 Units  18 Units Subcutaneous Daily Ivor Costa, MD   18 Units at 08/19/17 1022  . labetalol (NORMODYNE) tablet 200 mg  200 mg Oral BID Ivor Costa, MD   200 mg at 08/19/17 0913  . nicotine (NICODERM CQ - dosed in mg/24 hours) patch 21 mg  21 mg Transdermal Daily Ivor Costa, MD   21 mg at 08/19/17 0913  . ondansetron (ZOFRAN) tablet 4 mg  4 mg Oral Q6H PRN Ivor Costa, MD       Or  . ondansetron (ZOFRAN) injection 4 mg  4 mg Intravenous Q6H PRN Ivor Costa, MD      . pantoprazole (PROTONIX) EC tablet 40 mg  40 mg Oral Daily Ivor Costa, MD   40 mg at 08/19/17 0913  . zolpidem (AMBIEN) tablet 5 mg  5 mg Oral QHS PRN Ivor Costa, MD  Current Outpatient Medications  Medication Sig Dispense Refill  . amLODipine (NORVASC) 5 MG tablet Take 5 mg by mouth daily.    [name] aspirin 81 MG tablet Take 81 mg by mouth daily.      [name] atorvastatin (LIPITOR) 20 MG tablet TAKE 1 TABLET BY MOUTH  DAILY 90 tablet 3  . buPROPion (WELLBUTRIN XL) 150 MG 24 hr tablet Take 1 tablet (150 mg total) by mouth daily. 30 tablet 5  . escitalopram (LEXAPRO) 5 MG tablet Take 1 tablet (5 mg total) by mouth daily. 90 tablet 1  . ferrous sulfate 325 (65 FE) MG tablet Take 1 tablet (325 mg total) by mouth 2 (two) times daily with a meal. 200 tablet 3  . insulin glargine (LANTUS) 100 UNIT/ML injection INJECT 35-40 UNITS INTO THE SKIN ONCE DAILY AS LONG AS SUGAR ABOVE 90. 40 mL 3  . labetalol (NORMODYNE) 200 MG tablet TAKE 1 TABLET BY MOUTH TWO  TIMES DAILY 180 tablet 1  . losartan-hydrochlorothiazide (HYZAAR) 100-25 MG tablet TAKE 1 TABLET BY MOUTH  DAILY  (Patient taking differently: Take 0.5 tablets by mouth daily. ) 90 tablet 1  . metFORMIN (GLUCOPHAGE) 1000 MG tablet TAKE 1 TABLET BY MOUTH TWO  TIMES DAILY WITH A MEAL 180 tablet 1  . omeprazole (PRILOSEC) 20 MG capsule TAKE 1 CAPSULE BY MOUTH  DAILY 90 capsule 1  . Blood Glucose Monitoring Suppl (ACCU-CHEK AVIVA PLUS) w/Device KIT Use device to check blood sugar three times a day (Patient not taking: Reported on 08/18/2017) 1 kit 0  . ONE TOUCH ULTRA TEST test strip TEST BLOOD SUGAR 3 TIMES A DAY (Patient not taking: Reported on 08/18/2017) 300 each 5     ROS:                                                                                                                                       History obtained from Sister who was at bedside  General ROS: negative for - chills, fatigue, fever, night sweats, weight gain or weight loss Psychological ROS: negative for - , hallucinations, memory difficulties, mood swings or  Ophthalmic ROS: negative for - blurry vision, double vision, eye pain or loss of vision ENT ROS: negative for - epistaxis, nasal discharge, oral lesions, sore throat, tinnitus or vertigo Respiratory ROS: negative for - cough,  shortness of breath or wheezing Cardiovascular ROS: negative for - chest pain, dyspnea on exertion,  Gastrointestinal ROS: Positive for -no incontinence Genito-Urinary ROS: Positive for -urinary incontinence Musculoskeletal ROS: negative for - joint swelling or muscular weakness Neurological ROS: as noted in HPI   General Examination:  Blood pressure (!) 154/83, pulse 73, temperature 98.1 F (36.7 C), temperature source Oral, resp. rate 20, height '5\' 8"'  (1.727 m), weight 84.8 kg (187 lb), SpO2 100 %.  HEENT-  Normocephalic, no lesions, without obvious abnormality.  Normal external eye and conjunctiva.   Cardiovascular-  pulses palpable throughout    Extremities- Warm, dry and intact Musculoskeletal-no joint tenderness, deformity or swelling Skin-warm and dry, he does have an area on his shins which are sunburn and multiple bruises from falling  Neurological Examination Mental Status: Alert, oriented to hospital only--when sister was on the phone he was unable to give the names of his nieces and nephews.  He does have a learning disability at baseline and sister gives history of inability to remember specific aspects.  Exam is very hard to get him to pay attention not watch TV.[name]  Speech fluent without evidence of aphasia.  Able to follow simple commands when paying attention commands without difficulty. Cranial Nerves: II:  Visual fields grossly normal,  III,IV, VI: ptosis not present, extra-ocular motions intact bilaterally, pupils equal, round, reactive to light and accommodation V,VII: smile appears to have a left facial droop however when he smiled at his sister and I asked if this is normal she stated that there was no difference., facial light touch sensation normal bilaterally VIII: hearing normal bilaterally IX,X: uvula rises symmetrically XI: bilateral shoulder shrug XII: midline tongue extension Motor: Right : Upper extremity   5/5    Left:     Upper extremity   5/5  Lower extremity   5/5     Lower extremity   5/5 Spasticity noted in bilateral lower extremities Sensory: Pinprick and light touch intact throughout, bilaterally--patient does have difficulty with proprioception Deep Tendon Reflexes: 2+ and symmetric throughout with 1+ at the ankles Plantars: Equivocal bilaterally Cerebellar: normal finger-to-nose, no normal heel-to-shin test Gait: normal gait and station   Lab Results: Basic Metabolic Panel: Recent Labs  Lab 08/18/17 1917 08/19/17 0319  NA 140 141  K 3.9 3.5  CL 102 104  CO2 28 27  GLUCOSE 86 72  BUN 30* 32*  CREATININE 1.71* 1.86*  CALCIUM 9.4 8.7*    CBC: Recent Labs  Lab 08/18/17 1917  08/19/17 0319  WBC 13.1* 11.8*  HGB 15.3 13.4  HCT 43.8 39.3  MCV 92.8 92.9  PLT 238 196    Lipid Panel: No results for input(s): CHOL, TRIG, HDL, CHOLHDL, VLDL, LDLCALC in the last 168 hours.  CBG: Recent Labs  Lab 08/18/17 1910 08/19/17 0908 08/19/17 1215  GLUCAP 77 164* 332*    Imaging: MRI brain/thoracic/lumbar spine  IMPRESSION: 1. Abnormal upper thoracic spinal cord signal affecting a 4.5 cm segment from T2-T3 to T4-T5. Central cord involvement with borderline to mild cord expansion. No enhancement. No other spinal cord signal abnormality, despite multilevel degenerative spinal stenosis elsewhere (see #2). In light of the patient history and Brain MRI findings today a subacute or chronic spinal cord infarct seems most likely. 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 The lack of enhancement argues against an acute inflammatory process of the cord. 2. Superimposed multilevel degenerative spinal stenosis, exacerbated in the thoracic spine by epidural lipomatosis. Predominantly mild degenerative cord mass effect occurs, but at T5-T6 there is moderate to severe stenosis with moderate cord mass effect, but no cord signal abnormality at that level.  3. Moderate degenerative lumbar spinal and lateral recess stenosis at L4-L5. 4.  No acute osseous abnormality identified in the spine. Electronically Signed: By: Genevie Ann M.D. On: 08/19/2017 08:48    Assessment and plan discussed with with attending physician and they are in agreement.    Etta Quill PA-C [facility] [phone]  08/19/2017, 12:26 PM   Assessment: 57 y.o. male with a history of mental retardation, diabetes, hypertension, hyperlipidemia.  He presents with subacute to chronic gait instability with falls.  Due to a recent fall where he was unable to  get up, he has been admitted for further evaluation.    His spinal MRI demonstrates a area of signal abnormality.  He has areas of chronic ischemia suspicious for multifocal ischemic disease of the brain.    He has an area of cord impingement just below the signal abnormality. I suspect that this represents syrinx from the disc below it, it may have been impinging more previously. The history is not consistent with ischemic cord infarct.   1) I have discussed with neurosurgery who will see him in consultation.  2) PT, OT 3) Given that I feel that the imaging findings explain his chronic problem, not sure that any further workup would be of benefit.   Roland Rack, MD [facility] [phone]  If 7pm- 7am, please page neurology on call as listed in [place].

## 2017-08-19 NOTE — Progress Notes (Signed)
CSW spoke with William [name] outside of the room as requested. During this time William sister was very tearful and appeared to be stressed as a result of dealing with pt and medical needs at this time. Sister expressed that she is not able to care for pt and if she could she would gladly let pt move into he home with her. Sister expressed that she spoke with MD last night and was informed that pt would be needing placement. CSW attempted to clarify if sister was wanting long term placement for pt or If she was looking for short term. CSW did explain to sister that if she is interested in long term placement for pt then she would need long term Medicaid. She expressed that she has been working with [employer] [phone] APS worker on getting things done for pt. Sister verbalized that pt is unable to care for self as pt forgets to take medications as well as pt buys food that is not diabetic friendly.   CSW has reached out to [place] with APS and left VM asking that she call CSW back for further information on William needs. CSW also reached out to [place] [phone] and left VM asking for returned call at this time. CSW aware that pt is being admitted to inpt and ED CSW will hand off to inpt CSW as needed. CSW will continue to follow for any further needs at this time.   Virgie Dad Marquia Costello, MSW, [facility] Clinical Social Worker [phone]

## 2017-08-19 NOTE — ED Notes (Signed)
Holding pt in ED until seen by neuro MD, per Neuro team PA.

## 2017-08-19 NOTE — Progress Notes (Signed)
PROGRESS NOTE    William [name]  JJO:[id number] DOB: October 31, 1960 DOA: 08/18/2017 PCP: Marin Olp, MD  Brief Narrative:William [name] is a 57 year old male with history of learning disability, type 2 diabetes, tobacco abuse, history of CVA with residual left-sided weakness, CK D stage III presented with frequent falls, balance problems, tingling numbness in both legs   Assessment & Plan:     Subacute CVA involving brain and likely thoracic cord -MRI brain remarkable for very advanced chronic small vessel disease and a subacute lacunar infarcts, in addition MRI thoracic spine notes abnormal central cord signal at the T2-T3 level likely suggestive of subacute infarct -Has some overlap of symptoms likely related to his prior history of CVA and likely diabetic peripheral neuropathy -Continue aspirin changed to 325 mg daily -Neurology consult requested -Check 2-D echocardiogram and carotid duplex -Check LDL and hemoglobin A1c -PT OT evaluation  Diabetes mellitus with renal complication ([place]):   -Last A1c 7.9 on 05/25/17, not well controled. -Lantus dose lowered to 18 units daily, metformin held -SSI  HLD: -lipitor  HTN:  -Continue home medications: Amlodipine, labetalol  -hold Hyzaar due to worsening renal function  GERD: -Protonix  Tobacco abuse: -counseled, continue Nicotine patch  CKD (chronic kidney disease), stage III ([place]): Slightly worsened in the baseline. -Baseline creatinine 1.3, his creatinine is 1.71 -given 1 L normal saline -Hold Hyzaar -Monitor creatinine  Depression: Stable, -Continue Lexapro, Wellbutrin,  History of Stroke (cerebrum) ([place]):  -with mild residual (aplasia, continueasa and lipitor  DVT ppx:  SQ Lovenox Code Status: DNR Family Communication:  no family at bedside Disposition Plan:  To be determined, awaiting physical therapy evaluation  Consultants:   Neuro   Procedures:   Antimicrobials:    Subjective: -c/o  frequent falls and balance problems since 8month  Objective: Vitals:   08/19/17 0515 08/19/17 0530 08/19/17 0545 08/19/17 0600  BP: (!) 113/93 (!) 165/80 (!) 168/84 (!) 173/148  Pulse: 70 73 71 71  Resp:      Temp:      TempSrc:      SpO2: 98% 100% 99% 99%  Weight:      Height:        Intake/Output Summary (Last 24 hours) at 08/19/2017 1057 Last data filed at 08/19/2017 0320 Gross per 24 hour  Intake 1000 ml  Output -  Net 1000 ml   Filed Weights   08/18/17 1854  Weight: 84.8 kg (187 lb)    Examination:  General exam: Appears calm and comfortable, AAOx2 Respiratory system: Clear to auscultation. Respiratory effort normal. Cardiovascular system: S1 & S2 heard, RRR.   Gastrointestinal system: Abdomen is nondistended, soft and nontender.Normal bowel sounds heard. Central nervous system: Alert and oriented x2, mild LUE and LLE weakness 4/5, chronic, DTR 1plus, plantars withdrawal, sensory: light touch intact Extremities: no edema Skin: No rashes, lesions or ulcers Psychiatry:  Mood & affect appropriate.     Data Reviewed:   CBC: Recent Labs  Lab 08/18/17 1917 08/19/17 0319  WBC 13.1* 11.8*  HGB 15.3 13.4  HCT 43.8 39.3  MCV 92.8 92.9  PLT 238 160   Basic Metabolic Panel: Recent Labs  Lab 08/18/17 1917 08/19/17 0319  NA 140 141  K 3.9 3.5  CL 102 104  CO2 28 27  GLUCOSE 86 72  BUN 30* 32*  CREATININE 1.71* 1.86*  CALCIUM 9.4 8.7*   GFR: Estimated Creatinine Clearance: 45.9 mL/min (A) (by C-G formula based on SCr of 1.86 mg/dL (H)). Liver Function Tests: No  results for input(s): AST, ALT, ALKPHOS, BILITOT, PROT, ALBUMIN in the last 168 hours. No results for input(s): LIPASE, AMYLASE in the last 168 hours. No results for input(s): AMMONIA in the last 168 hours. Coagulation Profile: No results for input(s): INR, PROTIME in the last 168 hours. Cardiac Enzymes: Recent Labs  Lab 08/18/17 0126  CKTOTAL 63   BNP (last 3 results) No results for  input(s): PROBNP in the last 8760 hours. HbA1C: No results for input(s): HGBA1C in the last 72 hours. CBG: Recent Labs  Lab 08/18/17 1910 08/19/17 0908  GLUCAP 77 164*   Lipid Profile: No results for input(s): CHOL, HDL, LDLCALC, TRIG, CHOLHDL, LDLDIRECT in the last 72 hours. Thyroid Function Tests: Recent Labs    08/19/17 0319  TSH 4.327   Anemia Panel: Recent Labs    08/19/17 0319  VITAMINB12 517  FOLATE 15.0   Urine analysis:    Component Value Date/Time   COLORURINE YELLOW 08/18/2017 1903   APPEARANCEUR HAZY (A) 08/18/2017 1903   LABSPEC 1.024 08/18/2017 1903   PHURINE 5.0 08/18/2017 1903   GLUCOSEU 50 (A) 08/18/2017 1903   HGBUR NEGATIVE 08/18/2017 1903   BILIRUBINUR NEGATIVE 08/18/2017 1903   KETONESUR NEGATIVE 08/18/2017 1903   PROTEINUR 100 (A) 08/18/2017 1903   UROBILINOGEN 0.2 09/19/2010 0945   NITRITE NEGATIVE 08/18/2017 1903   LEUKOCYTESUR NEGATIVE 08/18/2017 1903   Sepsis Labs: @LABRCNTIP (procalcitonin:4,lacticidven:4)  )No results found for this or any previous visit (from the past 240 hour(s)).       Radiology Studies: Mr Brain 88 Contrast  Result Date: 08/19/2017 CLINICAL DATA:  57 year old male with learning disability, whose family noted difficulty walking over the past year. Ataxia, falls. Generalized weakness. Urinary incontinence. Memory problems. Diabetes, hypertension, stage 3 chronic kidney disease. EXAM: MRI HEAD WITHOUT CONTRAST TECHNIQUE: Multiplanar, multiecho pulse sequences of the brain and surrounding structures were obtained without intravenous contrast. COMPARISON:  Brain MRI 07/25/2017, 11/10/2004. Thoracic and lumbar MRI today reported separately. FINDINGS: Brain: Widespread chronic abnormal T2 and FLAIR hyperintensity indicative of extensive chronic lacunar infarcts in the brainstem (primarily the pons), bilateral deep gray matter nuclei, posterior limb left internal capsule, bilateral corona radiata, and occasionally also the  cerebellum. There is Wallerian degeneration suspected at the right midbrain and brainstem. Some chronic hemosiderin is noted in the right corona radiata. Diffusion-weighted imaging today suggests a small linear area of restricted diffusion in the right corona radiata bordering the prior small vessel infarcts (series 7001, image 52). Furthermore, there is a punctate area of trace diffusion signal in the ventral left thalamus (series 5001, image 65) which appears isointense on ADC. Diffusion abnormality was present at both of these sites last month. No other restricted diffusion. No midline shift, mass effect, evidence of mass lesion, ventriculomegaly, extra-axial collection or acute intracranial hemorrhage. Cervicomedullary junction and pituitary are within normal limits. Vascular: Major intracranial vascular flow voids are stable since last month. The distal left vertebral artery appears chronically occluded. Skull and upper cervical spine: The visible cervical spine is remarkable for mild degenerative changes perhaps superimposed on some congenital canal narrowing. There is borderline to mild C3 and C4 level spinal stenosis which appears stable. See also the thoracic spine report today. Bone marrow signal is normal. Sinuses/Orbits: Continued subtotal opacification of the left maxillary sinus with some fluid. Mild ethmoid sinus mucosal thickening. Stable and negative orbits soft tissues. Other: Mastoid air cells remain clear. Visible internal auditory structures appear normal. IMPRESSION: 1. See abnormal Thoracic Spine MRI findings today reported separately. 2.  Very advanced chronic small vessel disease affecting the deep white matter, deep gray matter, and brainstem with two superimposed subacute appearing lacunar infarcts. No hemorrhage or mass effect. Electronically Signed   By: Genevie Ann M.D.   On: 08/19/2017 08:28   Mr Lumbar Spine Wo Contrast  Addendum Date: 08/19/2017   ADDENDUM REPORT: 08/19/2017 09:23  ADDENDUM: Study discussed by telephone with Hospitalist Dr. Broadus John on 08/19/2017 at 0857 hours. Electronically Signed   By: Genevie Ann M.D.   On: 08/19/2017 09:23   Result Date: 08/19/2017 CLINICAL DATA:  57 year old male with learning disability, whose family has noted difficulty walking over the past year. Ataxia, falls. Generalized weakness. Urinary incontinence. Memory problems. Diabetes, hypertension, stage 3 chronic kidney disease. EXAM: MRI THORACIC SPINE WITHOUT AND WITH CONTRAST MRI LUMBAR SPINE WITHOUT CONTRAST. TECHNIQUE: Multiplanar and multiecho pulse sequences of the thoracic and lumbar spine were obtained. The thoracic spine was imaged without and with intravenous contrast. CONTRAST:  16mL MULTIHANCE GADOBENATE DIMEGLUMINE 529 MG/ML IV SOLN COMPARISON:  Brain MRI today and also 07/25/2017. Chest radiographs 05/25/2017, 09/19/2010. FINDINGS: MRI THORACIC SPINE FINDINGS Limited cervical spine imaging: Sagittal T1 and T2 weighted imaging was performed of the cervical spine in an attempt to quantify a degree of degenerative cervical spinal stenosis evident on the scout view. The sagittal T2 weighted imaging is degraded by motion. However, broad-based posterior disc protrusions are evident at C5-C6 and C6-C7 and result in at least mild cervical spinal stenosis with probably mild associated spinal cord mass effect. There is no definite abnormal signal in the cervical spinal cord. Thoracic spine segmentation:  Appears normal. Alignment: Thoracic vertebral height and alignment appears stable since 2012. Mildly exaggerated thoracic kyphosis. Vertebrae: Visualized bone marrow signal is within normal limits. No marrow edema or evidence of acute osseous abnormality. Cord: Confluent abnormal upper thoracic spinal cord signal consisting of nearly holo cord T2 and STIR hyperintensity from the T2-T3 to the T4-T5 levels. See series 19001 and series 21001, images 8 and 9. on axial images through this region there does  appear to be a rim of preserved spinal cord white matter. The cord does appear mildly expanded in the affected area. There are fairly discrete margins to normal cord signal above the T2-T3 disc and below the mid T5 vertebra. There is no other abnormal thoracic spinal cord signal, despite degenerative spinal stenosis with cord mass effect at T5-T6 (see below). The conus medullaris is visible on the lumbar images. Following contrast there is no abnormal spinal cord or intradural enhancement. No dural thickening. Paraspinal and other soft tissues: Negative. Disc levels: Notable thoracic spine degeneration as follows: T2-T3: Moderate sized right paracentral to subarticular disc protrusion with endplate spurring. Mild spinal stenosis with borderline to mild cord mass effect. Facet hypertrophy. Mild left T3 foraminal stenosis. T3 and T4 levels: Moderate facet and ligament flavum hypertrophy, but no spinal stenosis. T5-T6: Right paracentral cephalad disc extrusion (series 19001, image 8) superimposed on rightward disc bulging, endplate spurring, moderate to severe ligament flavum hypertrophy, mild facet hypertrophy, and epidural lipomatosis. There is moderate to severe spinal stenosis and moderate cord mass effect (series 23001, image 16). From T5 inferiorly there is thoracic epidural lipomatosis which contributes to effaced CSF from the thecal sac. T8-T9: Mild left paracentral disc bulge or protrusion. Mild spinal stenosis with minimal left hemi cord mass effect. T9-T10: More broad-based posterior disc bulging and endplate spurring with epidural lipomatosis and moderate posterior element hypertrophy. Mild spinal stenosis with mild ventral cord mass effect. MRI  LUMBAR SPINE FINDINGS Segmentation: Normal, concordant with the thoracic spine numbering. There is a vestigial S1-S2 disc space, but the S1 level is otherwise fully sacralized. Alignment:  Normal lumbar lordosis. Vertebrae: Visualized bone marrow signal is within  normal limits. No marrow edema or evidence of acute osseous abnormality. Intact visible sacrum and SI joints. Conus medullaris: Extends to the L2 level and appears normal. No lower spinal cord or conus signal abnormality. Paraspinal and other soft tissues: There is a degree of bilateral renal atrophy. Small T2 hyperintense probable benign right renal cysts are noted. Negative visible other abdominal viscera. Negative visualized posterior paraspinal soft tissues. Disc levels: Mild lumbar spine degeneration overall. However, degenerative stenosis as follows: L4-L5: Circumferential disc bulge with broad-based posterior and biforaminal involvement. Moderate ligament flavum and moderate to severe facet hypertrophy. Tiny subligamentous synovial cysts not contributing to stenosis at this time (series 27001, image 24). Mild to moderate spinal and bilateral lateral recess stenosis (descending L5 nerve levels). Borderline to mild bilateral L4 neural foraminal stenosis, greater on the right. IMPRESSION: 1. Abnormal upper thoracic spinal cord signal affecting a 4.5 cm segment from T2-T3 to T4-T5. Central cord involvement with borderline to mild cord expansion. No enhancement. No other spinal cord signal abnormality, despite multilevel degenerative spinal stenosis elsewhere (see #2). In light of the patient history and Brain MRI findings today a subacute or chronic spinal cord infarct seems most likely. 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 The lack of enhancement argues against an acute inflammatory process of the cord. 2. Superimposed multilevel degenerative spinal stenosis, exacerbated in the thoracic spine by epidural lipomatosis. Predominantly mild degenerative cord mass effect occurs, but at T5-T6 there is moderate to severe stenosis with moderate cord  mass effect, but no cord signal abnormality at that level. 3. Moderate degenerative lumbar spinal and lateral recess stenosis at L4-L5. 4.  No acute osseous abnormality identified in the spine. Electronically Signed: By: Genevie Ann M.D. On: 08/19/2017 08:48   Mr Thoracic Spine W Wo Contrast  Addendum Date: 08/19/2017   ADDENDUM REPORT: 08/19/2017 09:23 ADDENDUM: Study discussed by telephone with Hospitalist Dr. Broadus John on 08/19/2017 at 0857 hours. Electronically Signed   By: Genevie Ann M.D.   On: 08/19/2017 09:23   Result Date: 08/19/2017 CLINICAL DATA:  57 year old male with learning disability, whose family has noted difficulty walking over the past year. Ataxia, falls. Generalized weakness. Urinary incontinence. Memory problems. Diabetes, hypertension, stage 3 chronic kidney disease. EXAM: MRI THORACIC SPINE WITHOUT AND WITH CONTRAST MRI LUMBAR SPINE WITHOUT CONTRAST. TECHNIQUE: Multiplanar and multiecho pulse sequences of the thoracic and lumbar spine were obtained. The thoracic spine was imaged without and with intravenous contrast. CONTRAST:  82mL MULTIHANCE GADOBENATE DIMEGLUMINE 529 MG/ML IV SOLN COMPARISON:  Brain MRI today and also 07/25/2017. Chest radiographs 05/25/2017, 09/19/2010. FINDINGS: MRI THORACIC SPINE FINDINGS Limited cervical spine imaging: Sagittal T1 and T2 weighted imaging was performed of the cervical spine in an attempt to quantify a degree of degenerative cervical spinal stenosis evident on the scout view. The sagittal T2 weighted imaging is degraded by motion. However, broad-based posterior disc protrusions are evident at C5-C6 and C6-C7 and result in at least mild cervical spinal stenosis with probably mild associated spinal cord mass effect. There is no definite abnormal signal in the cervical spinal cord. Thoracic spine segmentation:  Appears normal. Alignment: Thoracic vertebral  height and alignment appears stable since 2012. Mildly exaggerated thoracic kyphosis. Vertebrae: Visualized  bone marrow signal is within normal limits. No marrow edema or evidence of acute osseous abnormality. Cord: Confluent abnormal upper thoracic spinal cord signal consisting of nearly holo cord T2 and STIR hyperintensity from the T2-T3 to the T4-T5 levels. See series 19001 and series 21001, images 8 and 9. on axial images through this region there does appear to be a rim of preserved spinal cord white matter. The cord does appear mildly expanded in the affected area. There are fairly discrete margins to normal cord signal above the T2-T3 disc and below the mid T5 vertebra. There is no other abnormal thoracic spinal cord signal, despite degenerative spinal stenosis with cord mass effect at T5-T6 (see below). The conus medullaris is visible on the lumbar images. Following contrast there is no abnormal spinal cord or intradural enhancement. No dural thickening. Paraspinal and other soft tissues: Negative. Disc levels: Notable thoracic spine degeneration as follows: T2-T3: Moderate sized right paracentral to subarticular disc protrusion with endplate spurring. Mild spinal stenosis with borderline to mild cord mass effect. Facet hypertrophy. Mild left T3 foraminal stenosis. T3 and T4 levels: Moderate facet and ligament flavum hypertrophy, but no spinal stenosis. T5-T6: Right paracentral cephalad disc extrusion (series 19001, image 8) superimposed on rightward disc bulging, endplate spurring, moderate to severe ligament flavum hypertrophy, mild facet hypertrophy, and epidural lipomatosis. There is moderate to severe spinal stenosis and moderate cord mass effect (series 23001, image 16). From T5 inferiorly there is thoracic epidural lipomatosis which contributes to effaced CSF from the thecal sac. T8-T9: Mild left paracentral disc bulge or protrusion. Mild spinal stenosis with minimal left hemi cord mass effect. T9-T10: More broad-based posterior disc bulging and endplate spurring with epidural lipomatosis and moderate  posterior element hypertrophy. Mild spinal stenosis with mild ventral cord mass effect. MRI LUMBAR SPINE FINDINGS Segmentation: Normal, concordant with the thoracic spine numbering. There is a vestigial S1-S2 disc space, but the S1 level is otherwise fully sacralized. Alignment:  Normal lumbar lordosis. Vertebrae: Visualized bone marrow signal is within normal limits. No marrow edema or evidence of acute osseous abnormality. Intact visible sacrum and SI joints. Conus medullaris: Extends to the L2 level and appears normal. No lower spinal cord or conus signal abnormality. Paraspinal and other soft tissues: There is a degree of bilateral renal atrophy. Small T2 hyperintense probable benign right renal cysts are noted. Negative visible other abdominal viscera. Negative visualized posterior paraspinal soft tissues. Disc levels: Mild lumbar spine degeneration overall. However, degenerative stenosis as follows: L4-L5: Circumferential disc bulge with broad-based posterior and biforaminal involvement. Moderate ligament flavum and moderate to severe facet hypertrophy. Tiny subligamentous synovial cysts not contributing to stenosis at this time (series 27001, image 24). Mild to moderate spinal and bilateral lateral recess stenosis (descending L5 nerve levels). Borderline to mild bilateral L4 neural foraminal stenosis, greater on the right. IMPRESSION: 1. Abnormal upper thoracic spinal cord signal affecting a 4.5 cm segment from T2-T3 to T4-T5. Central cord involvement with borderline to mild cord expansion. No enhancement. No other spinal cord signal abnormality, despite multilevel degenerative spinal stenosis elsewhere (see #2). In light of the patient history and Brain MRI findings today a subacute or chronic spinal cord infarct seems most likely. Myelomalacia due to compressive myelopathy was considered in light of the multilevel cervical and thoracic spinal stenosis, but the cord signal abnormality is remote from the  areas of stenosis with mass effect. The lack of enhancement argues  against an acute inflammatory process of the cord. 2. Superimposed multilevel degenerative spinal stenosis, exacerbated in the thoracic spine by epidural lipomatosis. Predominantly mild degenerative cord mass effect occurs, but at T5-T6 there is moderate to severe stenosis with moderate cord mass effect, but no cord signal abnormality at that level. 3. Moderate degenerative lumbar spinal and lateral recess stenosis at L4-L5. 4.  No acute osseous abnormality identified in the spine. Electronically Signed: By: Genevie Ann M.D. On: 08/19/2017 08:48        Scheduled Meds: . amLODipine  5 mg Oral Daily  . aspirin  325 mg Oral Daily  . atorvastatin  20 mg Oral Daily  . buPROPion  150 mg Oral Daily  . enoxaparin (LOVENOX) injection  40 mg Subcutaneous Q24H  . escitalopram  5 mg Oral Daily  . ferrous sulfate  325 mg Oral BID WC  . insulin aspart  0-9 Units Subcutaneous TID WC  . insulin glargine  18 Units Subcutaneous Daily  . labetalol  200 mg Oral BID  . nicotine  21 mg Transdermal Daily  . pantoprazole  40 mg Oral Daily   Continuous Infusions:   LOS: 0 days    Time spent: 92min    Domenic Polite, MD [facility] Page via [email or web address], password [email or web address] After 7PM please contact night-coverage  08/19/2017, 10:57 AM

## 2017-08-20 ENCOUNTER — Telehealth: Payer: Self-pay | Admitting: Family Medicine

## 2017-08-20 ENCOUNTER — Inpatient Hospital Stay (HOSPITAL_BASED_OUTPATIENT_CLINIC_OR_DEPARTMENT_OTHER): Payer: Medicare Other

## 2017-08-20 DIAGNOSIS — I361 Nonrheumatic tricuspid (valve) insufficiency: Secondary | ICD-10-CM | POA: Diagnosis not present

## 2017-08-20 DIAGNOSIS — I6389 Other cerebral infarction: Secondary | ICD-10-CM

## 2017-08-20 LAB — GLUCOSE, CAPILLARY
GLUCOSE-CAPILLARY: 247 mg/dL — AB (ref 65–99)
Glucose-Capillary: 236 mg/dL — ABNORMAL HIGH (ref 65–99)
Glucose-Capillary: 300 mg/dL — ABNORMAL HIGH (ref 65–99)
Glucose-Capillary: 315 mg/dL — ABNORMAL HIGH (ref 65–99)

## 2017-08-20 LAB — LIPID PANEL
CHOL/HDL RATIO: 5 ratio
Cholesterol: 120 mg/dL (ref 0–200)
HDL: 24 mg/dL — ABNORMAL LOW (ref >40)
LDL CALC: 60 mg/dL (ref 0–99)
TRIGLYCERIDES: 181 mg/dL — AB (ref <150)
VLDL: 36 mg/dL (ref 0–40)

## 2017-08-20 LAB — ECHOCARDIOGRAM COMPLETE
Height: 69 in
WEIGHTICAEL: 3033.53 [oz_av]

## 2017-08-20 LAB — UREA NITROGEN, URINE: UREA NITROGEN UR: 827 mg/dL

## 2017-08-20 MED ORDER — INSULIN GLARGINE 100 UNIT/ML ~~LOC~~ SOLN
10.0000 [IU] | Freq: Once | SUBCUTANEOUS | Status: AC
  Administered 2017-08-20: 10 [IU] via SUBCUTANEOUS
  Filled 2017-08-20: qty 0.1

## 2017-08-20 MED ORDER — INSULIN GLARGINE 100 UNIT/ML ~~LOC~~ SOLN
25.0000 [IU] | Freq: Every day | SUBCUTANEOUS | Status: DC
  Administered 2017-08-21: 25 [IU] via SUBCUTANEOUS
  Filled 2017-08-20: qty 0.25

## 2017-08-20 NOTE — Progress Notes (Signed)
   08/20/17 1600  Clinical Encounter Type  Visited With Patient;Patient and family together  Visit Type Initial;Follow-up  Referral From Nurse  Consult/Referral To Chaplain  Spiritual Encounters  Spiritual Needs Brochure;Emotional  Stress Factors  Patient Stress Factors Exhausted  Family Stress Factors Exhausted    Pt had sister with him. Pt, Chaplain and [name] had hilarious conversations. Chaplain provided POA paperwork and explained. Chaplain also provided compassionate presence and  POA was completed and copies made. Pt and sister were very appreciative.  Jeramy Dimmick a Medical sales representative, [employer]

## 2017-08-20 NOTE — Consult Note (Signed)
Reason for Consult:increased T2 6 signal thoracic spinal cord with T5-6 disc bulge Referring Physician: neurology William [name] is an 57 y.o. male.  HPI: 57 year old gentleman who has had really very indolent 6 months of difficulty walking falls incontinence of bladder. He has a couple falls recently but he feels like his legs are giving way at the knee joint. Denies any particular event does not any sudden onset to the event 6 months ago its been more slow and progressive but present for over 6 months. He denies any numbness in his legs except with referable to his feet and he reports low back pain. I did meet with him and his sister  Past Medical History:  Diagnosis Date  . CEREBROVASCULAR ACCIDENT, HX OF 09/28/2006   2007 affected left side and peripheral vision  . DIABETES MELLITUS, TYPE II 09/28/2006  . GERD (gastroesophageal reflux disease)   . HYPERLIPIDEMIA 09/28/2006  . HYPERTENSION 09/28/2006  . Learning disability   . Stroke William [name]) years ago    Past Surgical History:  Procedure Laterality Date  . COLONOSCOPY    . LYMPHADENECTOMY    . RHINOPLASTY    . SIGMOIDOSCOPY      Family History  Problem Relation Age of Onset  . Lung cancer Mother   . Diabetes Father   . Stroke Father   . Alcohol abuse Brother   . Diabetes Brother   . Heart attack Sister   . Colon cancer Neg Hx     Social History:  reports that he has been smoking cigarettes.  He has been smoking about 1.00 pack per day. He has quit using smokeless tobacco. He reports that he does not drink alcohol or use drugs.  Allergies:  Allergies  Allergen Reactions  . Sulfamethoxazole     REACTION: rash    Medications: I have reviewed the patient's current medications.  Results for orders placed or performed during the hospital encounter of 08/18/17 (from the past 48 hour(s))  Urinalysis, Routine w reflex microscopic     Status: Abnormal   Collection Time: 08/18/17  7:03 PM  Result Value Ref  Range   Color, Urine YELLOW YELLOW   APPearance HAZY (A) CLEAR   Specific Gravity, Urine 1.024 1.005 - 1.030   pH 5.0 5.0 - 8.0   Glucose, UA 50 (A) NEGATIVE mg/dL   Hgb urine dipstick NEGATIVE NEGATIVE   Bilirubin Urine NEGATIVE NEGATIVE   Ketones, ur NEGATIVE NEGATIVE mg/dL   Protein, ur 100 (A) NEGATIVE mg/dL   Nitrite NEGATIVE NEGATIVE   Leukocytes, UA NEGATIVE NEGATIVE   RBC / HPF 0-5 0 - 5 RBC/hpf   WBC, UA 0-5 0 - 5 WBC/hpf   Bacteria, UA RARE (A) NONE SEEN   Squamous Epithelial / LPF 0-5 0 - 5    Comment: Please note change in reference range.   Mucus PRESENT    Sperm, UA PRESENT     Comment: Performed at [facility] [address], [place], [place] [address]  Creatinine, urine, random     Status: None   Collection Time: 08/18/17  7:03 PM  Result Value Ref Range   Creatinine, Urine 220.12 mg/dL    Comment: Performed at [facility] [address] [place], [place] [address]  Urea nitrogen, urine     Status: None   Collection Time: 08/18/17  7:03 PM  Result Value Ref Range   Urea Nitrogen, Ur 827 Not Estab. mg/dL    Comment: (NOTE)  Performed At: [name] [place], [place] [id number] Rush Farmer MD JY:[id number] Performed at [facility], [place] [address], [place], [place] [address]   CBG monitoring, ED     Status: None   Collection Time: 08/18/17  7:10 PM  Result Value Ref Range   Glucose-Capillary 77 65 - 99 mg/dL  Basic metabolic panel     Status: Abnormal   Collection Time: 08/18/17  7:17 PM  Result Value Ref Range   Sodium 140 135 - 145 mmol/L   Potassium 3.9 3.5 - 5.1 mmol/L   Chloride 102 101 - 111 mmol/L   CO2 28 22 - 32 mmol/L   Glucose, Bld 86 65 - 99 mg/dL   BUN 30 (H) 6 - 20 mg/dL   Creatinine, Ser 1.71 (H) 0.61 - 1.24 mg/dL   Calcium 9.4 8.9 - 10.3 mg/dL   GFR calc non Af Amer 42 (L) >60 mL/min   GFR calc Af Amer 49 (L) >60 mL/min    Comment: (NOTE) The eGFR has been calculated using the CKD EPI  equation. This calculation has not been validated in all clinical situations. eGFR's persistently <60 mL/min signify possible Chronic Kidney Disease.    Anion gap 10 5 - 15    Comment: Performed at [facility] [address], [place], [place] [address]  CBC     Status: Abnormal   Collection Time: 08/18/17  7:17 PM  Result Value Ref Range   WBC 13.1 (H) 4.0 - 10.5 K/uL   RBC 4.72 4.22 - 5.81 MIL/uL   Hemoglobin 15.3 13.0 - 17.0 g/dL   HCT 43.8 39.0 - 52.0 %   MCV 92.8 78.0 - 100.0 fL   MCH 32.4 26.0 - 34.0 pg   MCHC 34.9 30.0 - 36.0 g/dL   RDW 13.3 11.5 - 15.5 %   Platelets 238 150 - 400 K/uL    Comment: Performed at [name] [address] [place], [place] [address]  RPR     Status: None   Collection Time: 08/19/17  3:18 AM  Result Value Ref Range   RPR Ser Ql Non Reactive Non Reactive    Comment: (NOTE) Performed At: [name] [place], [place] [id number] Rush Farmer MD VO:[id number] Performed at [facility], [place] [address] [place], [place] [address]   HIV antibody (Routine Testing)     Status: None   Collection Time: 08/19/17  3:18 AM  Result Value Ref Range   HIV Screen 4th Generation wRfx Non Reactive Non Reactive    Comment: (NOTE) Performed At: [facility] [address] [place], [place] [id number] Rush Farmer MD [id number] Performed at [facility], [place] [address] [place], [place] [address]   TSH     Status: None   Collection Time: 08/19/17  3:19 AM  Result Value Ref Range   TSH 4.327 0.350 - 4.500 uIU/mL    Comment: Performed by a 3rd Generation assay with a functional sensitivity of <=0.01 uIU/mL. Performed at [facility], [place] [address], [place], [place] [address]   Folate     Status: None   Collection Time: 08/19/17  3:19 AM  Result Value Ref Range   Folate 15.0 >5.9 ng/mL    Comment: Performed at [facility] [address] [place],  [address]  Basic  metabolic panel     Status: Abnormal   Collection Time: 08/19/17  3:19 AM  Result Value Ref Range   Sodium 141 135 - 145 mmol/L   Potassium 3.5 3.5 - 5.1 mmol/L   Chloride 104 101 - 111 mmol/L   CO2 27 22 - 32 mmol/L   Glucose, Bld 72 65 - 99 mg/dL   BUN 32 (H) 6 - 20 mg/dL   Creatinine, Ser 1.86 (H) 0.61 - 1.24 mg/dL   Calcium 8.7 (L) 8.9 - 10.3 mg/dL   GFR calc non Af Amer 38 (L) >60 mL/min   GFR calc Af Amer 44 (L) >60 mL/min    Comment: (NOTE) The eGFR has been calculated using the CKD EPI equation. This calculation has not been validated in all clinical situations. eGFR's persistently <60 mL/min signify possible Chronic Kidney Disease.    Anion gap 10 5 - 15    Comment: Performed at [facility] [address], [place], [place] [address]  CBC     Status: Abnormal   Collection Time: 08/19/17  3:19 AM  Result Value Ref Range   WBC 11.8 (H) 4.0 - 10.5 K/uL   RBC 4.23 4.22 - 5.81 MIL/uL   Hemoglobin 13.4 13.0 - 17.0 g/dL   HCT 39.3 39.0 - 52.0 %   MCV 92.9 78.0 - 100.0 fL   MCH 31.7 26.0 - 34.0 pg   MCHC 34.1 30.0 - 36.0 g/dL   RDW 13.2 11.5 - 15.5 %   Platelets 196 150 - 400 K/uL    Comment: Performed at [facility], [place] [address], [place], [place] [address]  Vitamin B12     Status: None   Collection Time: 08/19/17  3:19 AM  Result Value Ref Range   Vitamin B-12 517 180 - 914 pg/mL    Comment: (NOTE) This assay is not validated for testing neonatal or myeloproliferative syndrome specimens for Vitamin B12 levels. Performed at [facility], [place] [address], [place], [place] [address]   Hemoglobin A1c     Status: Abnormal   Collection Time: 08/19/17  3:19 AM  Result Value Ref Range   Hgb A1c MFr Bld 9.2 (H) 4.8 - 5.6 %    Comment: (NOTE) Pre diabetes:          5.7%-6.4% Diabetes:              >6.4% Glycemic control for   <7.0% adults with diabetes    Mean Plasma Glucose 217.34 mg/dL    Comment: Performed at [facility] [address], [place], [place] [address]  CBG monitoring, ED     Status: Abnormal   Collection Time: 08/19/17  9:08 AM  Result Value Ref Range   Glucose-Capillary 164 (H) 65 - 99 mg/dL  CBG monitoring, ED     Status: Abnormal   Collection Time: 08/19/17 12:15 PM  Result Value Ref Range   Glucose-Capillary 332 (H) 65 - 99 mg/dL  Glucose, capillary     Status: Abnormal   Collection Time: 08/19/17  5:38 PM  Result Value Ref Range   Glucose-Capillary 325 (H) 65 - 99 mg/dL  Glucose, capillary     Status: Abnormal   Collection Time: 08/19/17 11:04 PM  Result Value Ref Range   Glucose-Capillary 267 (H) 65 - 99 mg/dL   Comment 1 Notify RN    Comment 2 Document in Chart   Lipid panel     Status: Abnormal   Collection Time: 08/20/17  4:30 AM  Result Value Ref Range   Cholesterol 120 0 - 200  mg/dL   Triglycerides 181 (H) <150 mg/dL   HDL 24 (L) >40 mg/dL   Total CHOL/HDL Ratio 5.0 RATIO   VLDL 36 0 - 40 mg/dL   LDL Cholesterol 60 0 - 99 mg/dL    Comment:        Total Cholesterol/HDL:CHD Risk Coronary Heart Disease Risk Table                     Men   Women  1/2 Average Risk   3.4   3.3  Average Risk       5.0   4.4  2 X Average Risk   9.6   7.1  3 X Average Risk  23.4   11.0        Use the calculated Patient Ratio above and the CHD Risk Table to determine the patient's CHD Risk.        ATP III CLASSIFICATION (LDL):  <100     mg/dL   Optimal  100-129  mg/dL   Near or Above                    Optimal  130-159  mg/dL   Borderline  160-189  mg/dL   High  >190     mg/dL   Very High Performed at [facility] [address], [place], [place] [address]   Glucose, capillary     Status: Abnormal   Collection Time: 08/20/17  8:17 AM  Result Value Ref Range   Glucose-Capillary 236 (H) 65 - 99 mg/dL   Comment 1 Notify RN    Comment 2 Document in Chart   Glucose, capillary     Status: Abnormal   Collection Time: 08/20/17 12:55 PM  Result Value Ref Range   Glucose-Capillary 300 (H) 65 - 99  mg/dL   Comment 1 Notify RN    Comment 2 Document in Chart     Mr Brain Wo Contrast  Result Date: 08/19/2017 CLINICAL DATA:  57 year old male with learning disability, whose family noted difficulty walking over the past year. Ataxia, falls. Generalized weakness. Urinary incontinence. Memory problems. Diabetes, hypertension, stage 3 chronic kidney disease. EXAM: MRI HEAD WITHOUT CONTRAST TECHNIQUE: Multiplanar, multiecho pulse sequences of the brain and surrounding structures were obtained without intravenous contrast. COMPARISON:  Brain MRI 07/25/2017, 11/10/2004. Thoracic and lumbar MRI today reported separately. FINDINGS: Brain: Widespread chronic abnormal T2 and FLAIR hyperintensity indicative of extensive chronic lacunar infarcts in the brainstem (primarily the pons), bilateral deep gray matter nuclei, posterior limb left internal capsule, bilateral corona radiata, and occasionally also the cerebellum. There is Wallerian degeneration suspected at the right midbrain and brainstem. Some chronic hemosiderin is noted in the right corona radiata. Diffusion-weighted imaging today suggests a small linear area of restricted diffusion in the right corona radiata bordering the prior small vessel infarcts (series 7001, image 52). Furthermore, there is a punctate area of trace diffusion signal in the ventral left thalamus (series 5001, image 65) which appears isointense on ADC. Diffusion abnormality was present at both of these sites last month. No other restricted diffusion. No midline shift, mass effect, evidence of mass lesion, ventriculomegaly, extra-axial collection or acute intracranial hemorrhage. Cervicomedullary junction and pituitary are within normal limits. Vascular: Major intracranial vascular flow voids are stable since last month. The distal left vertebral artery appears chronically occluded. Skull and upper cervical spine: The visible cervical spine is remarkable for mild degenerative changes perhaps  superimposed on some congenital canal narrowing. There  is borderline to mild C3 and C4 level spinal stenosis which appears stable. See also the thoracic spine report today. Bone marrow signal is normal. Sinuses/Orbits: Continued subtotal opacification of the left maxillary sinus with some fluid. Mild ethmoid sinus mucosal thickening. Stable and negative orbits soft tissues. Other: Mastoid air cells remain clear. Visible internal auditory structures appear normal. IMPRESSION: 1. See abnormal Thoracic Spine MRI findings today reported separately. 2. Very advanced chronic small vessel disease affecting the deep white matter, deep gray matter, and brainstem with two superimposed subacute appearing lacunar infarcts. No hemorrhage or mass effect. Electronically Signed   By: Genevie Ann M.D.   On: 08/19/2017 08:28   Mr Lumbar Spine Wo Contrast  Addendum Date: 08/19/2017   ADDENDUM REPORT: 08/19/2017 12:58 ADDENDUM: Study discussed in person with Dr. Roland Rack on 08/19/2017 at 1245 hours. Electronically Signed   By: Genevie Ann M.D.   On: 08/19/2017 12:58   Addendum Date: 08/19/2017   ADDENDUM REPORT: 08/19/2017 09:23 ADDENDUM: Study discussed by telephone with Hospitalist Dr. Broadus John on 08/19/2017 at 0857 hours. Electronically Signed   By: Genevie Ann M.D.   On: 08/19/2017 09:23   Result Date: 08/19/2017 CLINICAL DATA:  57 year old male with learning disability, whose family has noted difficulty walking over the past year. Ataxia, falls. Generalized weakness. Urinary incontinence. Memory problems. Diabetes, hypertension, stage 3 chronic kidney disease. EXAM: MRI THORACIC SPINE WITHOUT AND WITH CONTRAST MRI LUMBAR SPINE WITHOUT CONTRAST. TECHNIQUE: Multiplanar and multiecho pulse sequences of the thoracic and lumbar spine were obtained. The thoracic spine was imaged without and with intravenous contrast. CONTRAST:  65m MULTIHANCE GADOBENATE DIMEGLUMINE 529 MG/ML IV SOLN COMPARISON:  Brain MRI today and also 07/25/2017. Chest  radiographs 05/25/2017, 09/19/2010. FINDINGS: MRI THORACIC SPINE FINDINGS Limited cervical spine imaging: Sagittal T1 and T2 weighted imaging was performed of the cervical spine in an attempt to quantify a degree of degenerative cervical spinal stenosis evident on the scout view. The sagittal T2 weighted imaging is degraded by motion. However, broad-based posterior disc protrusions are evident at C5-C6 and C6-C7 and result in at least mild cervical spinal stenosis with probably mild associated spinal cord mass effect. There is no definite abnormal signal in the cervical spinal cord. Thoracic spine segmentation:  Appears normal. Alignment: Thoracic vertebral height and alignment appears stable since 2012. Mildly exaggerated thoracic kyphosis. Vertebrae: Visualized bone marrow signal is within normal limits. No marrow edema or evidence of acute osseous abnormality. Cord: Confluent abnormal upper thoracic spinal cord signal consisting of nearly holo cord T2 and STIR hyperintensity from the T2-T3 to the T4-T5 levels. See series 19001 and series 21001, images 8 and 9. on axial images through this region there does appear to be a rim of preserved spinal cord white matter. The cord does appear mildly expanded in the affected area. There are fairly discrete margins to normal cord signal above the T2-T3 disc and below the mid T5 vertebra. There is no other abnormal thoracic spinal cord signal, despite degenerative spinal stenosis with cord mass effect at T5-T6 (see below). The conus medullaris is visible on the lumbar images. Following contrast there is no abnormal spinal cord or intradural enhancement. No dural thickening. Paraspinal and other soft tissues: Negative. Disc levels: Notable thoracic spine degeneration as follows: T2-T3: Moderate sized right paracentral to subarticular disc protrusion with endplate spurring. Mild spinal stenosis with borderline to mild cord mass effect. Facet hypertrophy. Mild left T3 foraminal  stenosis. T3 and T4 levels: Moderate facet and ligament flavum hypertrophy,  but no spinal stenosis. T5-T6: Right paracentral cephalad disc extrusion (series 19001, image 8) superimposed on rightward disc bulging, endplate spurring, moderate to severe ligament flavum hypertrophy, mild facet hypertrophy, and epidural lipomatosis. There is moderate to severe spinal stenosis and moderate cord mass effect (series 23001, image 16). From T5 inferiorly there is thoracic epidural lipomatosis which contributes to effaced CSF from the thecal sac. T8-T9: Mild left paracentral disc bulge or protrusion. Mild spinal stenosis with minimal left hemi cord mass effect. T9-T10: More broad-based posterior disc bulging and endplate spurring with epidural lipomatosis and moderate posterior element hypertrophy. Mild spinal stenosis with mild ventral cord mass effect. MRI LUMBAR SPINE FINDINGS Segmentation: Normal, concordant with the thoracic spine numbering. There is a vestigial S1-S2 disc space, but the S1 level is otherwise fully sacralized. Alignment:  Normal lumbar lordosis. Vertebrae: Visualized bone marrow signal is within normal limits. No marrow edema or evidence of acute osseous abnormality. Intact visible sacrum and SI joints. Conus medullaris: Extends to the L2 level and appears normal. No lower spinal cord or conus signal abnormality. Paraspinal and other soft tissues: There is a degree of bilateral renal atrophy. Small T2 hyperintense probable benign right renal cysts are noted. Negative visible other abdominal viscera. Negative visualized posterior paraspinal soft tissues. Disc levels: Mild lumbar spine degeneration overall. However, degenerative stenosis as follows: L4-L5: Circumferential disc bulge with broad-based posterior and biforaminal involvement. Moderate ligament flavum and moderate to severe facet hypertrophy. Tiny subligamentous synovial cysts not contributing to stenosis at this time (series 27001, image 24).  Mild to moderate spinal and bilateral lateral recess stenosis (descending L5 nerve levels). Borderline to mild bilateral L4 neural foraminal stenosis, greater on the right. IMPRESSION: 1. Abnormal upper thoracic spinal cord signal affecting a 4.5 cm segment from T2-T3 to T4-T5. Central cord involvement with borderline to mild cord expansion. No enhancement. No other spinal cord signal abnormality, despite multilevel degenerative spinal stenosis elsewhere (see #2). In light of the patient history and Brain MRI findings today a subacute or chronic spinal cord infarct seems most likely. 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 The lack of enhancement argues against an acute inflammatory process of the cord. 2. Superimposed multilevel degenerative spinal stenosis, exacerbated in the thoracic spine by epidural lipomatosis. Predominantly mild degenerative cord mass effect occurs, but at T5-T6 there is moderate to severe stenosis with moderate cord mass effect, but no cord signal abnormality at that level. 3. Moderate degenerative lumbar spinal and lateral recess stenosis at L4-L5. 4.  No acute osseous abnormality identified in the spine. Electronically Signed: By: Genevie Ann M.D. On: 08/19/2017 08:48   Mr Thoracic Spine W Wo Contrast  Addendum Date: 08/19/2017   ADDENDUM REPORT: 08/19/2017 12:58 ADDENDUM: Study discussed in person with Dr. Roland Rack on 08/19/2017 at 1245 hours. Electronically Signed   By: Genevie Ann M.D.   On: 08/19/2017 12:58   Addendum Date: 08/19/2017   ADDENDUM REPORT: 08/19/2017 09:23 ADDENDUM: Study discussed by telephone with Hospitalist Dr. Broadus John on 08/19/2017 at 0857 hours. Electronically Signed   By: Genevie Ann M.D.   On: 08/19/2017 09:23   Result Date: 08/19/2017 CLINICAL DATA:  57 year old male with learning disability,  whose family has noted difficulty walking over the past year. Ataxia, falls. Generalized weakness. Urinary incontinence. Memory problems. Diabetes, hypertension, stage 3 chronic kidney disease. EXAM: MRI THORACIC SPINE  WITHOUT AND WITH CONTRAST MRI LUMBAR SPINE WITHOUT CONTRAST. TECHNIQUE: Multiplanar and multiecho pulse sequences of the thoracic and lumbar spine were obtained. The thoracic spine was imaged without and with intravenous contrast. CONTRAST:  23m MULTIHANCE GADOBENATE DIMEGLUMINE 529 MG/ML IV SOLN COMPARISON:  Brain MRI today and also 07/25/2017. Chest radiographs 05/25/2017, 09/19/2010. FINDINGS: MRI THORACIC SPINE FINDINGS Limited cervical spine imaging: Sagittal T1 and T2 weighted imaging was performed of the cervical spine in an attempt to quantify a degree of degenerative cervical spinal stenosis evident on the scout view. The sagittal T2 weighted imaging is degraded by motion. However, broad-based posterior disc protrusions are evident at C5-C6 and C6-C7 and result in at least mild cervical spinal stenosis with probably mild associated spinal cord mass effect. There is no definite abnormal signal in the cervical spinal cord. Thoracic spine segmentation:  Appears normal. Alignment: Thoracic vertebral height and alignment appears stable since 2012. Mildly exaggerated thoracic kyphosis. Vertebrae: Visualized bone marrow signal is within normal limits. No marrow edema or evidence of acute osseous abnormality. Cord: Confluent abnormal upper thoracic spinal cord signal consisting of nearly holo cord T2 and STIR hyperintensity from the T2-T3 to the T4-T5 levels. See series 19001 and series 21001, images 8 and 9. on axial images through this region there does appear to be a rim of preserved spinal cord white matter. The cord does appear mildly expanded in the affected area. There are fairly discrete margins to normal cord signal above the T2-T3 disc and below the mid T5 vertebra. There is no other  abnormal thoracic spinal cord signal, despite degenerative spinal stenosis with cord mass effect at T5-T6 (see below). The conus medullaris is visible on the lumbar images. Following contrast there is no abnormal spinal cord or intradural enhancement. No dural thickening. Paraspinal and other soft tissues: Negative. Disc levels: Notable thoracic spine degeneration as follows: T2-T3: Moderate sized right paracentral to subarticular disc protrusion with endplate spurring. Mild spinal stenosis with borderline to mild cord mass effect. Facet hypertrophy. Mild left T3 foraminal stenosis. T3 and T4 levels: Moderate facet and ligament flavum hypertrophy, but no spinal stenosis. T5-T6: Right paracentral cephalad disc extrusion (series 19001, image 8) superimposed on rightward disc bulging, endplate spurring, moderate to severe ligament flavum hypertrophy, mild facet hypertrophy, and epidural lipomatosis. There is moderate to severe spinal stenosis and moderate cord mass effect (series 23001, image 16). From T5 inferiorly there is thoracic epidural lipomatosis which contributes to effaced CSF from the thecal sac. T8-T9: Mild left paracentral disc bulge or protrusion. Mild spinal stenosis with minimal left hemi cord mass effect. T9-T10: More broad-based posterior disc bulging and endplate spurring with epidural lipomatosis and moderate posterior element hypertrophy. Mild spinal stenosis with mild ventral cord mass effect. MRI LUMBAR SPINE FINDINGS Segmentation: Normal, concordant with the thoracic spine numbering. There is a vestigial S1-S2 disc space, but the S1 level is otherwise fully sacralized. Alignment:  Normal lumbar lordosis. Vertebrae: Visualized bone marrow signal is within normal limits. No marrow edema or evidence of acute osseous abnormality. Intact visible sacrum and SI joints. Conus medullaris: Extends to the L2 level and appears normal. No lower spinal cord or conus signal abnormality. Paraspinal and other  soft tissues: There is a degree of bilateral renal atrophy. Small T2 hyperintense probable benign right renal cysts are noted. Negative visible other abdominal viscera. Negative visualized posterior paraspinal soft tissues. Disc levels: Mild lumbar spine degeneration overall. However, degenerative stenosis as follows: L4-L5: Circumferential disc bulge with broad-based posterior and biforaminal involvement.  Moderate ligament flavum and moderate to severe facet hypertrophy. Tiny subligamentous synovial cysts not contributing to stenosis at this time (series 27001, image 24). Mild to moderate spinal and bilateral lateral recess stenosis (descending L5 nerve levels). Borderline to mild bilateral L4 neural foraminal stenosis, greater on the right. IMPRESSION: 1. Abnormal upper thoracic spinal cord signal affecting a 4.5 cm segment from T2-T3 to T4-T5. Central cord involvement with borderline to mild cord expansion. No enhancement. No other spinal cord signal abnormality, despite multilevel degenerative spinal stenosis elsewhere (see #2). In light of the patient history and Brain MRI findings today a subacute or chronic spinal cord infarct seems most likely. 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 The lack of enhancement argues against an acute inflammatory process of the cord. 2. Superimposed multilevel degenerative spinal stenosis, exacerbated in the thoracic spine by epidural lipomatosis. Predominantly mild degenerative cord mass effect occurs, but at T5-T6 there is moderate to severe stenosis with moderate cord mass effect, but no cord signal abnormality at that level. 3. Moderate degenerative lumbar spinal and lateral recess stenosis at L4-L5. 4.  No acute osseous abnormality identified in the spine. Electronically Signed: By: Genevie Ann M.D. On:  08/19/2017 08:48    Review of Systems  Musculoskeletal: Positive for back pain and falls.  Neurological: Positive for tingling.   Blood pressure (!) 159/82, pulse (!) 59, temperature 98 F (36.7 C), temperature source Oral, resp. rate 18, height '5\' 9"'  (1.753 m), weight 86 kg (189 lb 9.5 oz), SpO2 99 %. Physical Exam  Neurological: He has normal strength. GCS eye subscore is 4. GCS verbal subscore is 5. GCS motor subscore is 6.  Strength is 5 out of 5 iliopsoas, quads, hip she's, gastrocs, and tibialis, EHLdoes have some looks to me spasticity reflexes are brisk but patient's also noncompliant with exam. Sensory level does not appear to be apparent has some decreased sensation in his feet consistent with his history of diabetic neuropathy.    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 Etiology unclear radiology is very confident this represents spinal cord infarct. I agree with neurology that an infarct usually has a sudden onset of progression. However this also does not image like a syrinx on his noncontrasted T1 sequences. Neurology does not appear to feel this is at the myelin disease I think the etiology still in question certainly there is not much we can do about it. After extensive conversations with the family and options I was recommending follow-up imaging to try to determine whether we can be more confident was either a stroke or a syrinx possibly even with a delayed myelogram but the sister reported that he would not tolerate any surgery and he would not interested in pursuing any Surgical intervention. I'm happy to follow up the patient has an outpatient upon discharge. There is no acute intervention needed.  Brazen Domangue P 08/20/2017, 2:44 PM

## 2017-08-20 NOTE — Social Work (Signed)
CSW has spoken with leadership of [facility]. CSW dept unable to expedite process for Medicaid, CSW Surveyor, quantity validated that this writer is able to connect pt and pt family with county DSS and they will support with completing Medicaid application with family.   CSW also met with APS social worker Eliezer Bottom through [facility], [phone]. Pt currently has an open case for self-neglect, request for medical records relating to pt needs and limitations to care for self have been formally requested by [facility]. A copy of records release will be placed with pt medical chart to be given to medical records.   This Probation officer will provide FL2, PT/OT evaluations and any other pertinent information to APS worker. APS worker states that this is still currently an open case and a determination has not been made yet. APS CSW states understanding that inpatient CSW is unable to expedite or complete Medicaid application for family and that it is handled through [facility].   This Probation officer has sent therapy notes and completed FL2 to local SNFs to prepare for discharge to SNF. At this time, unless pt has ability to private pay (which family has indicated he does not) this Probation officer is unable to facilitate placement in LTC facility as there is not a payor source for this care.    Alexander Mt, [employer] Work [phone]

## 2017-08-20 NOTE — Progress Notes (Signed)
  Echocardiogram 2D Echocardiogram has been performed.  William [name] 08/20/2017, 3:20 PM

## 2017-08-20 NOTE — Telephone Encounter (Signed)
Send back to [facility]- I do not see a DPR for "wife". im not sure who they are referencing here. I didn't think he was married even. This should not come to [place] if DPR is not verified from someone outside the patient- in fact the patient shouldn't be discussed at all until DPR verified  If this is someone on DPR_ I would advise them to talk to hospitalist and then we can do a discharge follow up here when he gets out if doesn't go to skilled nursing facility

## 2017-08-20 NOTE — Evaluation (Signed)
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 Pt currently with functional limitiations due to the deficits listed below (see OT problem list). Pt currently high fall risk with mutliple falls prior to admission.  Pt will benefit from skilled OT to increase their independence and safety with adls and balance to allow discharge SNF.     Follow Up Recommendations  SNF    Equipment Recommendations  Other (comment)(defer to venue)    Recommendations for Other Services Other (comment)(pastoral care for medical directives)     Precautions / Restrictions Precautions Precautions: Fall Precaution Comments: pt with multiple falls and often avoiding self reporting per sister.  Restrictions Weight Bearing Restrictions: No      Mobility Bed Mobility               General bed mobility comments: pt presented sitting in recliner chair upon arrival  Transfers Overall transfer level: Needs assistance Equipment used: 2 person hand held assist Transfers: Sit to/from Stand Sit to Stand: +2 physical assistance;Min assist         General transfer comment: pt requiring assistance for stability with transitional movement    Balance Overall balance assessment: Needs assistance Sitting-balance support: Feet supported Sitting balance-Leahy Scale: Fair     Standing balance support: During functional activity;Bilateral upper extremity supported Standing balance-Leahy Scale: Poor               High level balance activites: (P) Direction changes;Turns;Head turns High Level Balance  Comments: (P) LOB with head turns           ADL either performed or assessed with clinical judgement   ADL Overall ADL's : Needs assistance/impaired Eating/Feeding: Set up   Grooming: Wash/dry hands;Minimal assistance;Standing Grooming Details (indicate cue type and reason): static standing at sink and cues to complete hand hygiene in detail Upper Body Bathing: Minimal assistance Upper Body Bathing Details (indicate cue type and reason): requires seated position Lower Body Bathing: Minimal assistance           Toilet Transfer: Minimal assistance         Tub/Shower Transfer Details (indicate cue type and reason): sister reports several falls in the bathroom at home Functional mobility during ADLs: +2 for safety/equipment;Minimal assistance;Moderate assistance General ADL Comments: pt requires total +2 min (A) during session and was able to progress to Mod (A) with L drift during session until fatigued and returning to two person (A)     Vision Baseline Vision/History: Wears glasses Wears Glasses: (does not wear them but prescribed them per sister) Patient Visual Report: Peripheral vision impairment Additional Comments: pt per chart with peripheral deficits. pt with x2 eye surgeries per sister. pt noted to tilt chin left and chin tuck to use Right visual fields during session.      Perception     Praxis      Pertinent Vitals/Pain Pain Assessment: No/denies pain     Hand Dominance Right   Extremity/Trunk Assessment Upper Extremity Assessment Upper Extremity Assessment: Defer to OT evaluation   Lower Extremity Assessment Lower Extremity Assessment: Generalized weakness LLE Deficits / Details: hx  of L LE weakness from previous stroke   Cervical / Trunk Assessment Cervical / Trunk Assessment: Normal   Communication Communication Communication: No difficulties   Cognition Arousal/Alertness: Awake/alert Behavior During Therapy: WFL for tasks  assessed/performed Overall Cognitive Status: History of cognitive impairments - at baseline                                 General Comments: pt with learning disability per sister. Pt is unable to read or write per sister - uncertain if this is completely due to learning disability or  the visual deficits. pt unable to recognize colors correctly with 4 out 7 during session.    General Comments  sunburn on LE due to prolonged time in the sun - down 4 hours prior to family locating patient    Exercises     Shoulder Instructions      Home Living Family/patient expects to be discharged to:: Private residence Living Arrangements: Alone Available Help at Discharge: Family;Available PRN/intermittently Type of Home: Mobile home Home Access: Stairs to enter Entrance Stairs-Number of Steps: 5 Entrance Stairs-Rails: None Home Layout: One level     Bathroom Shower/Tub: Tub only   Biochemist, clinical: Standard     Home Equipment: None   Additional Comments: pt lives in a mobile home on cousin property per sister. pt reports that patient has experienced incontinence for 8 months and due to balance deficits has not cleaned the home. Sister reports patient falling partly over objects in the home. APS and DSS have been activated per chart for this patient. Sister very much wants to help brother but currently works. Sister [name] present throughout session.      Prior Functioning/Environment Level of Independence: Independent                 OT Problem List: Decreased activity tolerance;Decreased knowledge of precautions;Decreased knowledge of use of DME or AE;Decreased safety awareness;Decreased strength;Impaired balance (sitting and/or standing);Impaired vision/perception      OT Treatment/Interventions: Self-care/ADL training;Therapeutic activities;Therapeutic exercise;DME and/or AE instruction;Cognitive remediation/compensation;Visual/perceptual  remediation/compensation;Patient/family education;Balance training    OT Goals(Current goals can be found in the care plan section) Acute Rehab OT Goals Patient Stated Goal: patient does not state OT Goal Formulation: With patient/family Time For Goal Achievement: 09/03/17 Potential to Achieve Goals: Good  OT Frequency: Min 2X/week   Barriers to D/C: Decreased caregiver support  lives alone        Co-evaluation PT/OT/SLP Co-Evaluation/Treatment: Yes Reason for Co-Treatment: To address functional/ADL transfers;For patient/therapist safety PT goals addressed during session: Mobility/safety with mobility;Balance;Strengthening/ROM OT goals addressed during session: ADL's and self-care;Proper use of Adaptive equipment and DME;Strengthening/ROM      AM-PAC PT "6 Clicks" Daily Activity     Outcome Measure Help from another person eating meals?: None Help from another person taking care of personal grooming?: A Little Help from another person toileting, which includes using toliet, bedpan, or urinal?: A Little Help from another person bathing (including washing, rinsing, drying)?: A Little Help from another person to put on and taking off regular upper body clothing?: A Little Help from another person to put on and taking off regular lower body clothing?: A Lot 6 Click Score: 18   End of Session Equipment Utilized During Treatment: Gait belt Nurse Communication: Mobility status;Precautions  Activity Tolerance: Patient tolerated treatment well Patient left: in chair;with call bell/phone within reach;with chair alarm set  OT Visit Diagnosis: Unsteadiness on feet (  R26.81)                Time: 7737-3668 OT Time Calculation (min): 29 min Charges:  OT General Charges $OT Visit: 1 Visit OT Evaluation $OT Eval Moderate Complexity: 1 Mod G-Codes:      Jeri Modena   OTR/L Pager: [phone] Office: [phone] .   Parke Poisson B 08/20/2017, 3:07 PM

## 2017-08-20 NOTE — Progress Notes (Signed)
Subjective: No change today and no complaints.   Exam: Vitals:   08/20/17 0533 08/20/17 0555  BP: (!) 154/80 (!) 159/82  Pulse: 62 (!) 59  Resp: 18 18  Temp: 97.6 F (36.4 C) 98 F (36.7 C)  SpO2: 100% 99%    Physical Exam   HEENT-  Normocephalic, no lesions, without obvious abnormality.  Normal external eye and conjunctiva.   Cardiovascular- S1-S2 audible, pulses palpable throughout   Lungs-no rhonchi or wheezing noted, no excessive working breathing.  Saturations within normal limits Abdomen- All 4 quadrants palpated and nontender Extremities- Warm, dry and intact Musculoskeletal-no joint tenderness, deformity or swelling Skin-warm and dry, he does have an area on his shins which are sunburn and multiple bruises from falling      Neuro:  no change from yesterday  Mental Status: Alert, oriented to hospital only--when sister was on the phone he was unable to give the names of his nieces and nephews.  He does have a learning disability at baseline and sister gives history of inability to remember specific aspects.  Exam is very hard to get him to pay attention not watch TV.[name]  Speech fluent without evidence of aphasia.  Able to follow simple commands when paying attention commands without difficulty. Cranial Nerves: II:  Visual fields grossly normal,  III,IV, VI: ptosis not present, extra-ocular motions intact bilaterally, pupils equal, round, reactive to light and accommodation V,VII: smile appears to have a left facial droop however when he smiled at his sister and I asked if this is normal she stated that there was no difference., facial light touch sensation normal bilaterally VIII: hearing normal bilaterally IX,X: uvula rises symmetrically XI: bilateral shoulder shrug XII: midline tongue extension Motor: Right :  Upper extremity   5/5                                      Left:     Upper extremity   5/5             Lower extremity   5/5                                                   Lower extremity   5/5 Spasticity noted in bilateral lower extremities Sensory: Pinprick and light touch intact throughout, bilaterally--patient does have difficulty with proprioception Deep Tendon Reflexes: 2+ and symmetric throughout with 1+ at the ankles Plantars: Equivocal bilaterally Cerebellar: normal finger-to-nose, no normal heel-to-shin test Gait: wide based, small  Steps, fenestrated turning    Medications:  Scheduled: . amLODipine  5 mg Oral Daily  . aspirin  325 mg Oral Daily  . atorvastatin  20 mg Oral q1800  . buPROPion  150 mg Oral Daily  . escitalopram  5 mg Oral Daily  . ferrous sulfate  325 mg Oral BID WC  . insulin aspart  0-9 Units Subcutaneous TID WC  . insulin glargine  18 Units Subcutaneous Daily  . labetalol  200 mg Oral BID  . nicotine  21 mg Transdermal Daily  . pantoprazole  40 mg Oral Daily   Continuous:   Pertinent Labs/Diagnostics:   Mr Brain Wo Contrast  Result Date: 08/19/2017 IMPRESSION: 1. See abnormal Thoracic Spine MRI findings today reported separately. 2. Very advanced  chronic small vessel disease affecting the deep white matter, deep gray matter, and brainstem with two superimposed subacute appearing lacunar infarcts. No hemorrhage or mass effect. Electronically Signed   By: Genevie Ann M.D.   On: 08/19/2017 08:28    Mr Thoracic Spine W Wo Contrast  Addendum Date: 08/19/2017   ADDENDUM REPORT: 08/19/2017 12:58 ADDENDUM: Study discussed in person with Dr. Roland Rack on 08/19/2017 at 1245 hours. Electronically Signed   By: Genevie Ann M.D.   On: 08/19/2017 12:58   Addendum Date: 08/19/2017   ADDENDUM REPORT: 08/19/2017 09:23 ADDENDUM: Study discussed by telephone with Hospitalist Dr. Broadus John on 08/19/2017 at 0857 hours. Electronically Signed   By: Genevie Ann M.D.   On: 08/19/2017 09:23   Result Date: 08/19/2017  IMPRESSION: 1. Abnormal upper thoracic spinal cord signal affecting a 4.5 cm segment from T2-T3 to T4-T5. Central cord  involvement with borderline to mild cord expansion. No enhancement. No other spinal cord signal abnormality, despite multilevel degenerative spinal stenosis elsewhere (see #2). In light of the patient history and Brain MRI findings today a subacute or chronic spinal cord infarct seems most likely. 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 The lack of enhancement argues against an acute inflammatory process of the cord. 2. Superimposed multilevel degenerative spinal stenosis, exacerbated in the thoracic spine by epidural lipomatosis. Predominantly mild degenerative cord mass effect occurs, but at T5-T6 there is moderate to severe stenosis with moderate cord mass effect, but no cord signal abnormality at that level. 3. Moderate degenerative lumbar spinal and lateral recess stenosis at L4-L5. 4.  No acute osseous abnormality identified in the spine. Electronically Signed: By: Genevie Ann M.D. On: 08/19/2017 08:48     Etta Quill PA-C [facility] [phone]  Impression: 57 y.o. male with a history of mental retardation, diabetes, hypertension, hyperlipidemia.  He presents with subacute to chronic gait instability with falls.  Due to a recent fall where he was unable to get up, he has been admitted for further evaluation.    His spinal MRI demonstrates a area of signal abnormality.  He has areas of chronic ischemia suspicious for multifocal ischemic disease of the brain.    He has an area of cord impingement just below the signal abnormality. Continue to suspect that this represents syrinx from the disc below it, it may have been impinging more previously. The history is not consistent with ischemic cord infarct.   1) I have discussed with neurosurgery who will see him in consultation.  2) PT, OT 3) Will need out patient  neurology follow up.   Neurology will S/O      08/20/2017, 12:06 PM

## 2017-08-20 NOTE — Progress Notes (Signed)
PROGRESS NOTE    William [name]  VQQ:[id number] DOB: 1961/03/11 DOA: 08/18/2017 PCP: Marin Olp, MD  Brief Narrative:William [name] is a 57 year old male with history of learning disability, type 2 diabetes, tobacco abuse, history of CVA with residual left-sided weakness, CK D stage III presented with frequent falls, balance problems, tingling numbness in both legs, incontinence   Assessment & Plan:     Subacute CVA involving brain and likely thoracic cord vs syrinx at T2 -MRI brain remarkable for very advanced chronic small vessel disease and a subacute lacunar infarcts, in addition MRI thoracic spine notes abnormal central cord signal at the T2-T3 level likely suggestive of subacute infarct, per Neuro this could be syrinx -NSG Dr.Cram consulted, await input -Has some overlap of symptoms likely related to his prior history of CVA and likely diabetic peripheral neuropathy -Continue aspirin changed to 325 mg daily -Neurology consult requested -2-D echocardiogram and carotid duplex unremarkable -LDL ok, and hemoglobin A1c is 9 -PT OT evaluation pending  Diabetes mellitus with renal complication ([place]):   -EPP2R is 9 -increase lantus dose, metformin held -SSI  HLD: -lipitor  HTN:  -Continue home medications: Amlodipine, labetalol  -hold Hyzaar due to worsening renal function  GERD: -Protonix  Tobacco abuse: -counseled, continue Nicotine patch  CKD (chronic kidney disease), stage III ([place]): Slightly worsened in the baseline. -Baseline creatinine 1.3, his creatinine is 1.71 -given 1 L normal saline -Hold Hyzaar -Monitor creatinine  Depression: Stable, -Continue Lexapro, Wellbutrin,  History of Stroke (cerebrum) ([place]):  -with mild residual (aplasia, continueasa and lipitor  DVT ppx:  SQ Lovenox Code Status: DNR Family Communication:  sister at bedside Disposition Plan:  To be determined, awaiting physical therapy evaluation  Consultants:    Neuro   Procedures:   Antimicrobials:    Subjective: -no new issues -balance is a problem along with incontinence  Objective: Vitals:   08/19/17 2114 08/19/17 2115 08/20/17 0533 08/20/17 0555  BP: (!) 168/83 (!) 168/83 (!) 154/80 (!) 159/82  Pulse: 68 68 62 (!) 59  Resp: 18  18 18   Temp: 98.6 F (37 C)  97.6 F (36.4 C) 98 F (36.7 C)  TempSrc: Oral  Oral Oral  SpO2: 100%  100% 99%  Weight:      Height:        Intake/Output Summary (Last 24 hours) at 08/20/2017 1253 Last data filed at 08/20/2017 0533 Gross per 24 hour  Intake -  Output 1000 ml  Net -1000 ml   Filed Weights   08/18/17 1854 08/19/17 1359  Weight: 84.8 kg (187 lb) 86 kg (189 lb 9.5 oz)    Examination:  General exam: Appears calm and comfortable, AAOx2 Respiratory system: Clear to auscultation. Respiratory effort normal. Cardiovascular system: S1 & S2 heard, RRR.   Gastrointestinal system: Abdomen is nondistended, soft and nontender.Normal bowel sounds heard. Central nervous system: Alert and oriented x2, mild LUE and LLE weakness 4/5, chronic, DTR 1plus, plantars withdrawal, sensory: light touch intact Extremities: no edema Skin: No rashes, lesions or ulcers Psychiatry:  Mood & affect appropriate.     Data Reviewed:   CBC: Recent Labs  Lab 08/18/17 1917 08/19/17 0319  WBC 13.1* 11.8*  HGB 15.3 13.4  HCT 43.8 39.3  MCV 92.8 92.9  PLT 238 518   Basic Metabolic Panel: Recent Labs  Lab 08/18/17 1917 08/19/17 0319  NA 140 141  K 3.9 3.5  CL 102 104  CO2 28 27  GLUCOSE 86 72  BUN 30* 32*  CREATININE  1.71* 1.86*  CALCIUM 9.4 8.7*   GFR: Estimated Creatinine Clearance: 47 mL/min (A) (by C-G formula based on SCr of 1.86 mg/dL (H)). Liver Function Tests: No results for input(s): AST, ALT, ALKPHOS, BILITOT, PROT, ALBUMIN in the last 168 hours. No results for input(s): LIPASE, AMYLASE in the last 168 hours. No results for input(s): AMMONIA in the last 168 hours. Coagulation  Profile: No results for input(s): INR, PROTIME in the last 168 hours. Cardiac Enzymes: Recent Labs  Lab 08/18/17 0126  CKTOTAL 63   BNP (last 3 results) No results for input(s): PROBNP in the last 8760 hours. HbA1C: Recent Labs    08/19/17 0319  HGBA1C 9.2*   CBG: Recent Labs  Lab 08/19/17 0908 08/19/17 1215 08/19/17 1738 08/19/17 2304 08/20/17 0817  GLUCAP 164* 332* 325* 267* 236*   Lipid Profile: Recent Labs    08/20/17 0430  CHOL 120  HDL 24*  LDLCALC 60  TRIG 181*  CHOLHDL 5.0   Thyroid Function Tests: Recent Labs    08/19/17 0319  TSH 4.327   Anemia Panel: Recent Labs    08/19/17 0319  VITAMINB12 517  FOLATE 15.0   Urine analysis:    Component Value Date/Time   COLORURINE YELLOW 08/18/2017 1903   APPEARANCEUR HAZY (A) 08/18/2017 1903   LABSPEC 1.024 08/18/2017 1903   PHURINE 5.0 08/18/2017 1903   GLUCOSEU 50 (A) 08/18/2017 1903   HGBUR NEGATIVE 08/18/2017 1903   BILIRUBINUR NEGATIVE 08/18/2017 1903   KETONESUR NEGATIVE 08/18/2017 1903   PROTEINUR 100 (A) 08/18/2017 1903   UROBILINOGEN 0.2 09/19/2010 0945   NITRITE NEGATIVE 08/18/2017 1903   LEUKOCYTESUR NEGATIVE 08/18/2017 1903   Sepsis Labs: @LABRCNTIP (procalcitonin:4,lacticidven:4)  )No results found for this or any previous visit (from the past 240 hour(s)).       Radiology Studies: Mr Brain 52 Contrast  Result Date: 08/19/2017 CLINICAL DATA:  57 year old male with learning disability, whose family noted difficulty walking over the past year. Ataxia, falls. Generalized weakness. Urinary incontinence. Memory problems. Diabetes, hypertension, stage 3 chronic kidney disease. EXAM: MRI HEAD WITHOUT CONTRAST TECHNIQUE: Multiplanar, multiecho pulse sequences of the brain and surrounding structures were obtained without intravenous contrast. COMPARISON:  Brain MRI 07/25/2017, 11/10/2004. Thoracic and lumbar MRI today reported separately. FINDINGS: Brain: Widespread chronic abnormal T2 and  FLAIR hyperintensity indicative of extensive chronic lacunar infarcts in the brainstem (primarily the pons), bilateral deep gray matter nuclei, posterior limb left internal capsule, bilateral corona radiata, and occasionally also the cerebellum. There is Wallerian degeneration suspected at the right midbrain and brainstem. Some chronic hemosiderin is noted in the right corona radiata. Diffusion-weighted imaging today suggests a small linear area of restricted diffusion in the right corona radiata bordering the prior small vessel infarcts (series 7001, image 52). Furthermore, there is a punctate area of trace diffusion signal in the ventral left thalamus (series 5001, image 65) which appears isointense on ADC. Diffusion abnormality was present at both of these sites last month. No other restricted diffusion. No midline shift, mass effect, evidence of mass lesion, ventriculomegaly, extra-axial collection or acute intracranial hemorrhage. Cervicomedullary junction and pituitary are within normal limits. Vascular: Major intracranial vascular flow voids are stable since last month. The distal left vertebral artery appears chronically occluded. Skull and upper cervical spine: The visible cervical spine is remarkable for mild degenerative changes perhaps superimposed on some congenital canal narrowing. There is borderline to mild C3 and C4 level spinal stenosis which appears stable. See also the thoracic spine report today. Bone marrow signal  is normal. Sinuses/Orbits: Continued subtotal opacification of the left maxillary sinus with some fluid. Mild ethmoid sinus mucosal thickening. Stable and negative orbits soft tissues. Other: Mastoid air cells remain clear. Visible internal auditory structures appear normal. IMPRESSION: 1. See abnormal Thoracic Spine MRI findings today reported separately. 2. Very advanced chronic small vessel disease affecting the deep white matter, deep gray matter, and brainstem with two  superimposed subacute appearing lacunar infarcts. No hemorrhage or mass effect. Electronically Signed   By: Genevie Ann M.D.   On: 08/19/2017 08:28   Mr Lumbar Spine Wo Contrast  Addendum Date: 08/19/2017   ADDENDUM REPORT: 08/19/2017 12:58 ADDENDUM: Study discussed in person with Dr. Roland Rack on 08/19/2017 at 1245 hours. Electronically Signed   By: Genevie Ann M.D.   On: 08/19/2017 12:58   Addendum Date: 08/19/2017   ADDENDUM REPORT: 08/19/2017 09:23 ADDENDUM: Study discussed by telephone with Hospitalist Dr. Broadus John on 08/19/2017 at 0857 hours. Electronically Signed   By: Genevie Ann M.D.   On: 08/19/2017 09:23   Result Date: 08/19/2017 CLINICAL DATA:  57 year old male with learning disability, whose family has noted difficulty walking over the past year. Ataxia, falls. Generalized weakness. Urinary incontinence. Memory problems. Diabetes, hypertension, stage 3 chronic kidney disease. EXAM: MRI THORACIC SPINE WITHOUT AND WITH CONTRAST MRI LUMBAR SPINE WITHOUT CONTRAST. TECHNIQUE: Multiplanar and multiecho pulse sequences of the thoracic and lumbar spine were obtained. The thoracic spine was imaged without and with intravenous contrast. CONTRAST:  72mL MULTIHANCE GADOBENATE DIMEGLUMINE 529 MG/ML IV SOLN COMPARISON:  Brain MRI today and also 07/25/2017. Chest radiographs 05/25/2017, 09/19/2010. FINDINGS: MRI THORACIC SPINE FINDINGS Limited cervical spine imaging: Sagittal T1 and T2 weighted imaging was performed of the cervical spine in an attempt to quantify a degree of degenerative cervical spinal stenosis evident on the scout view. The sagittal T2 weighted imaging is degraded by motion. However, broad-based posterior disc protrusions are evident at C5-C6 and C6-C7 and result in at least mild cervical spinal stenosis with probably mild associated spinal cord mass effect. There is no definite abnormal signal in the cervical spinal cord. Thoracic spine segmentation:  Appears normal. Alignment: Thoracic vertebral  height and alignment appears stable since 2012. Mildly exaggerated thoracic kyphosis. Vertebrae: Visualized bone marrow signal is within normal limits. No marrow edema or evidence of acute osseous abnormality. Cord: Confluent abnormal upper thoracic spinal cord signal consisting of nearly holo cord T2 and STIR hyperintensity from the T2-T3 to the T4-T5 levels. See series 19001 and series 21001, images 8 and 9. on axial images through this region there does appear to be a rim of preserved spinal cord white matter. The cord does appear mildly expanded in the affected area. There are fairly discrete margins to normal cord signal above the T2-T3 disc and below the mid T5 vertebra. There is no other abnormal thoracic spinal cord signal, despite degenerative spinal stenosis with cord mass effect at T5-T6 (see below). The conus medullaris is visible on the lumbar images. Following contrast there is no abnormal spinal cord or intradural enhancement. No dural thickening. Paraspinal and other soft tissues: Negative. Disc levels: Notable thoracic spine degeneration as follows: T2-T3: Moderate sized right paracentral to subarticular disc protrusion with endplate spurring. Mild spinal stenosis with borderline to mild cord mass effect. Facet hypertrophy. Mild left T3 foraminal stenosis. T3 and T4 levels: Moderate facet and ligament flavum hypertrophy, but no spinal stenosis. T5-T6: Right paracentral cephalad disc extrusion (series 19001, image 8) superimposed on rightward disc bulging, endplate spurring, moderate to  severe ligament flavum hypertrophy, mild facet hypertrophy, and epidural lipomatosis. There is moderate to severe spinal stenosis and moderate cord mass effect (series 23001, image 16). From T5 inferiorly there is thoracic epidural lipomatosis which contributes to effaced CSF from the thecal sac. T8-T9: Mild left paracentral disc bulge or protrusion. Mild spinal stenosis with minimal left hemi cord mass effect.  T9-T10: More broad-based posterior disc bulging and endplate spurring with epidural lipomatosis and moderate posterior element hypertrophy. Mild spinal stenosis with mild ventral cord mass effect. MRI LUMBAR SPINE FINDINGS Segmentation: Normal, concordant with the thoracic spine numbering. There is a vestigial S1-S2 disc space, but the S1 level is otherwise fully sacralized. Alignment:  Normal lumbar lordosis. Vertebrae: Visualized bone marrow signal is within normal limits. No marrow edema or evidence of acute osseous abnormality. Intact visible sacrum and SI joints. Conus medullaris: Extends to the L2 level and appears normal. No lower spinal cord or conus signal abnormality. Paraspinal and other soft tissues: There is a degree of bilateral renal atrophy. Small T2 hyperintense probable benign right renal cysts are noted. Negative visible other abdominal viscera. Negative visualized posterior paraspinal soft tissues. Disc levels: Mild lumbar spine degeneration overall. However, degenerative stenosis as follows: L4-L5: Circumferential disc bulge with broad-based posterior and biforaminal involvement. Moderate ligament flavum and moderate to severe facet hypertrophy. Tiny subligamentous synovial cysts not contributing to stenosis at this time (series 27001, image 24). Mild to moderate spinal and bilateral lateral recess stenosis (descending L5 nerve levels). Borderline to mild bilateral L4 neural foraminal stenosis, greater on the right. IMPRESSION: 1. Abnormal upper thoracic spinal cord signal affecting a 4.5 cm segment from T2-T3 to T4-T5. Central cord involvement with borderline to mild cord expansion. No enhancement. No other spinal cord signal abnormality, despite multilevel degenerative spinal stenosis elsewhere (see #2). In light of the patient history and Brain MRI findings today a subacute or chronic spinal cord infarct seems most likely. Myelomalacia due to compressive myelopathy was considered in light of  the multilevel cervical and thoracic spinal stenosis, but the cord signal abnormality is remote from the areas of stenosis with mass effect. The lack of enhancement argues against an acute inflammatory process of the cord. 2. Superimposed multilevel degenerative spinal stenosis, exacerbated in the thoracic spine by epidural lipomatosis. Predominantly mild degenerative cord mass effect occurs, but at T5-T6 there is moderate to severe stenosis with moderate cord mass effect, but no cord signal abnormality at that level. 3. Moderate degenerative lumbar spinal and lateral recess stenosis at L4-L5. 4.  No acute osseous abnormality identified in the spine. Electronically Signed: By: Genevie Ann M.D. On: 08/19/2017 08:48   Mr Thoracic Spine W Wo Contrast  Addendum Date: 08/19/2017   ADDENDUM REPORT: 08/19/2017 12:58 ADDENDUM: Study discussed in person with Dr. Roland Rack on 08/19/2017 at 1245 hours. Electronically Signed   By: Genevie Ann M.D.   On: 08/19/2017 12:58   Addendum Date: 08/19/2017   ADDENDUM REPORT: 08/19/2017 09:23 ADDENDUM: Study discussed by telephone with Hospitalist Dr. Broadus John on 08/19/2017 at 0857 hours. Electronically Signed   By: Genevie Ann M.D.   On: 08/19/2017 09:23   Result Date: 08/19/2017 CLINICAL DATA:  57 year old male with learning disability, whose family has noted difficulty walking over the past year. Ataxia, falls. Generalized weakness. Urinary incontinence. Memory problems. Diabetes, hypertension, stage 3 chronic kidney disease. EXAM: MRI THORACIC SPINE WITHOUT AND WITH CONTRAST MRI LUMBAR SPINE WITHOUT CONTRAST. TECHNIQUE: Multiplanar and multiecho pulse sequences of the thoracic and lumbar spine were obtained.  The thoracic spine was imaged without and with intravenous contrast. CONTRAST:  76mL MULTIHANCE GADOBENATE DIMEGLUMINE 529 MG/ML IV SOLN COMPARISON:  Brain MRI today and also 07/25/2017. Chest radiographs 05/25/2017, 09/19/2010. FINDINGS: MRI THORACIC SPINE FINDINGS Limited cervical  spine imaging: Sagittal T1 and T2 weighted imaging was performed of the cervical spine in an attempt to quantify a degree of degenerative cervical spinal stenosis evident on the scout view. The sagittal T2 weighted imaging is degraded by motion. However, broad-based posterior disc protrusions are evident at C5-C6 and C6-C7 and result in at least mild cervical spinal stenosis with probably mild associated spinal cord mass effect. There is no definite abnormal signal in the cervical spinal cord. Thoracic spine segmentation:  Appears normal. Alignment: Thoracic vertebral height and alignment appears stable since 2012. Mildly exaggerated thoracic kyphosis. Vertebrae: Visualized bone marrow signal is within normal limits. No marrow edema or evidence of acute osseous abnormality. Cord: Confluent abnormal upper thoracic spinal cord signal consisting of nearly holo cord T2 and STIR hyperintensity from the T2-T3 to the T4-T5 levels. See series 19001 and series 21001, images 8 and 9. on axial images through this region there does appear to be a rim of preserved spinal cord white matter. The cord does appear mildly expanded in the affected area. There are fairly discrete margins to normal cord signal above the T2-T3 disc and below the mid T5 vertebra. There is no other abnormal thoracic spinal cord signal, despite degenerative spinal stenosis with cord mass effect at T5-T6 (see below). The conus medullaris is visible on the lumbar images. Following contrast there is no abnormal spinal cord or intradural enhancement. No dural thickening. Paraspinal and other soft tissues: Negative. Disc levels: Notable thoracic spine degeneration as follows: T2-T3: Moderate sized right paracentral to subarticular disc protrusion with endplate spurring. Mild spinal stenosis with borderline to mild cord mass effect. Facet hypertrophy. Mild left T3 foraminal stenosis. T3 and T4 levels: Moderate facet and ligament flavum hypertrophy, but no spinal  stenosis. T5-T6: Right paracentral cephalad disc extrusion (series 19001, image 8) superimposed on rightward disc bulging, endplate spurring, moderate to severe ligament flavum hypertrophy, mild facet hypertrophy, and epidural lipomatosis. There is moderate to severe spinal stenosis and moderate cord mass effect (series 23001, image 16). From T5 inferiorly there is thoracic epidural lipomatosis which contributes to effaced CSF from the thecal sac. T8-T9: Mild left paracentral disc bulge or protrusion. Mild spinal stenosis with minimal left hemi cord mass effect. T9-T10: More broad-based posterior disc bulging and endplate spurring with epidural lipomatosis and moderate posterior element hypertrophy. Mild spinal stenosis with mild ventral cord mass effect. MRI LUMBAR SPINE FINDINGS Segmentation: Normal, concordant with the thoracic spine numbering. There is a vestigial S1-S2 disc space, but the S1 level is otherwise fully sacralized. Alignment:  Normal lumbar lordosis. Vertebrae: Visualized bone marrow signal is within normal limits. No marrow edema or evidence of acute osseous abnormality. Intact visible sacrum and SI joints. Conus medullaris: Extends to the L2 level and appears normal. No lower spinal cord or conus signal abnormality. Paraspinal and other soft tissues: There is a degree of bilateral renal atrophy. Small T2 hyperintense probable benign right renal cysts are noted. Negative visible other abdominal viscera. Negative visualized posterior paraspinal soft tissues. Disc levels: Mild lumbar spine degeneration overall. However, degenerative stenosis as follows: L4-L5: Circumferential disc bulge with broad-based posterior and biforaminal involvement. Moderate ligament flavum and moderate to severe facet hypertrophy. Tiny subligamentous synovial cysts not contributing to stenosis at this time (series 27001, image  24). Mild to moderate spinal and bilateral lateral recess stenosis (descending L5 nerve levels).  Borderline to mild bilateral L4 neural foraminal stenosis, greater on the right. IMPRESSION: 1. Abnormal upper thoracic spinal cord signal affecting a 4.5 cm segment from T2-T3 to T4-T5. Central cord involvement with borderline to mild cord expansion. No enhancement. No other spinal cord signal abnormality, despite multilevel degenerative spinal stenosis elsewhere (see #2). In light of the patient history and Brain MRI findings today a subacute or chronic spinal cord infarct seems most likely. 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 The lack of enhancement argues against an acute inflammatory process of the cord. 2. Superimposed multilevel degenerative spinal stenosis, exacerbated in the thoracic spine by epidural lipomatosis. Predominantly mild degenerative cord mass effect occurs, but at T5-T6 there is moderate to severe stenosis with moderate cord mass effect, but no cord signal abnormality at that level. 3. Moderate degenerative lumbar spinal and lateral recess stenosis at L4-L5. 4.  No acute osseous abnormality identified in the spine. Electronically Signed: By: Genevie Ann M.D. On: 08/19/2017 08:48        Scheduled Meds: . amLODipine  5 mg Oral Daily  . aspirin  325 mg Oral Daily  . atorvastatin  20 mg Oral q1800  . buPROPion  150 mg Oral Daily  . escitalopram  5 mg Oral Daily  . ferrous sulfate  325 mg Oral BID WC  . insulin aspart  0-9 Units Subcutaneous TID WC  . insulin glargine  18 Units Subcutaneous Daily  . labetalol  200 mg Oral BID  . nicotine  21 mg Transdermal Daily  . pantoprazole  40 mg Oral Daily   Continuous Infusions:   LOS: 1 day    Time spent: 70min    Domenic Polite, MD [facility] Page via [email or web address], password [email or web address] After 7PM please contact night-coverage  08/20/2017, 12:53 PM

## 2017-08-20 NOTE — Progress Notes (Signed)
npatient Diabetes Program Recommendations  AACE/ADA: New Consensus Statement on Inpatient Glycemic Control (2015)  Target Ranges:  Prepandial:   less than 140 mg/dL      Peak postprandial:   less than 180 mg/dL (1-2 hours)      Critically ill patients:  140 - 180 mg/dL   Results for William [name], William [name] (MRN [id number]) as of 08/20/2017 10:43  Ref. Range 08/19/2017 09:08 08/19/2017 12:15 08/19/2017 17:38 08/19/2017 23:04 08/20/2017 08:17  Glucose-Capillary Latest Ref Range: 65 - 99 mg/dL 164 (H) 332 (H) 325 (H) 267 (H) 236 (H)   Review of Glycemic Control  Diabetes history: DM 2 Outpatient Diabetes medications: Lantus 35-40 units, Metformin 1000 mg BID Current orders for Inpatient glycemic control: Lantus 18 units, Novolog Sensitive Correction 0-9 units tid  Inpatient Diabetes Program Recommendations:    Fasting glucose 236 this am after Lantus 18 units. Please consider increasing Lantus to 25 units, closer to home dose.  Thanks,  Tama Headings RN, MSN, BC-ADM, [facility] Inpatient Diabetes Coordinator Team Pager [phone] (8a-5p)

## 2017-08-20 NOTE — NC FL2 (Signed)
[place] LEVEL OF CARE SCREENING TOOL     IDENTIFICATION  Patient Name: William [name] Birthdate: 1961-03-30 Sex: male Admission Date (Current Location): 08/18/2017  [facility] [place] Number:  Herbalist and Address:  [facility], [place] [address], [place], [place] [address]      Provider Number: [id number]  Attending Physician Name and Address:  Domenic Polite, MD  Relative Name and Phone Number:       Current Level of Care: Hospital Recommended Level of Care: [place] Prior Approval Number:    Date Approved/Denied:   PASRR Number: [id number]  Discharge Plan: SNF    Current Diagnoses: Patient Active Problem List   Diagnosis Date Noted  . Stroke (cerebrum) ([place]) 08/19/2017  . Fall 08/19/2017  . Frequent falls   . Urinary incontinence   . Abnormal gait 07/14/2017  . Depression, major, single episode, mild ([place]) 04/23/2017  . Inactivity 02/09/2017  . Claudication ([place]) 10/09/2015  . Iron deficiency anemia 06/25/2015  . CKD (chronic kidney disease), stage III ([place]) 04/30/2014  . Tobacco use disorder 11/21/2013  . GERD (gastroesophageal reflux disease) 09/25/2013  . ONYCHOMYCOSIS, TOENAILS 03/22/2007  . Type II diabetes mellitus with renal manifestations ([place]) 09/28/2006  . Hyperlipidemia 09/28/2006  . Essential hypertension 09/28/2006  . History of cardiovascular disorder 09/28/2006    Orientation RESPIRATION BLADDER Height & Weight     Self, Time, Place  Normal Incontinent, External catheter Weight: 189 lb 9.5 oz (86 kg) Height:  5\' 9"  (175.3 cm)  BEHAVIORAL SYMPTOMS/MOOD NEUROLOGICAL BOWEL NUTRITION STATUS    (hx of learning disability) Incontinent Diet  AMBULATORY STATUS COMMUNICATION OF NEEDS Skin   Limited Assist Verbally Normal                       Personal Care Assistance Level of Assistance  Bathing, Feeding, Dressing Bathing Assistance: Limited assistance Feeding  assistance: Independent Dressing Assistance: Limited assistance     Functional Limitations Info  Sight, Hearing, Speech Sight Info: Adequate Hearing Info: Adequate Speech Info: Adequate    SPECIAL CARE FACTORS FREQUENCY  PT (By licensed PT), OT (By licensed OT)     PT Frequency: 5x week OT Frequency: 5x week            Contractures Contractures Info: Not present    Additional Factors Info  Code Status, Psychotropic, Allergies, Insulin Sliding Scale Code Status Info: DNR Allergies Info: SULFAMETHOXAZOLE  Psychotropic Info: buPROPion (WELLBUTRIN XL) 24 hr tablet 150 mg daily PO Insulin Sliding Scale Info: Lantus 35-40 units, Metformin 1000 mg BID       Current Medications (08/20/2017):  This is the current hospital active medication list Current Facility-Administered Medications  Medication Dose Route Frequency Provider Last Rate Last Dose  . acetaminophen (TYLENOL) tablet 650 mg  650 mg Oral Q6H PRN Ivor Costa, MD   650 mg at 08/19/17 2237   Or  . acetaminophen (TYLENOL) suppository 650 mg  650 mg Rectal Q6H PRN Ivor Costa, MD      . amLODipine (NORVASC) tablet 5 mg  5 mg Oral Daily Ivor Costa, MD   5 mg at 08/20/17 0917  . aspirin tablet 325 mg  325 mg Oral Daily Domenic Polite, MD   325 mg at 08/20/17 7001  . atorvastatin (LIPITOR) tablet 20 mg  20 mg Oral q1800 Ivor Costa, MD   20 mg at 08/19/17 1729  . buPROPion (WELLBUTRIN XL) 24 hr tablet 150 mg  150 mg Oral Daily Ivor Costa, MD   150 mg at 08/20/17 4818  . escitalopram (LEXAPRO) tablet 5 mg  5 mg Oral Daily Ivor Costa, MD   5 mg at 08/20/17 0917  . ferrous sulfate tablet 325 mg  325 mg Oral BID WC Ivor Costa, MD   325 mg at 08/20/17 0749  . hydrALAZINE (APRESOLINE) injection 5 mg  5 mg Intravenous Q2H PRN Ivor Costa, MD      . insulin aspart (novoLOG) injection 0-9 Units  0-9 Units Subcutaneous TID WC Ivor Costa, MD   5 Units at 08/20/17 1255  . insulin glargine (LANTUS) injection 10 Units  10 Units Subcutaneous  Once Domenic Polite, MD      . Derrill Memo ON 08/21/2017] insulin glargine (LANTUS) injection 25 Units  25 Units Subcutaneous Daily Domenic Polite, MD      . labetalol (NORMODYNE) tablet 200 mg  200 mg Oral BID Ivor Costa, MD   200 mg at 08/20/17 0917  . nicotine (NICODERM CQ - dosed in mg/24 hours) patch 21 mg  21 mg Transdermal Daily Ivor Costa, MD   21 mg at 08/20/17 5631  . ondansetron (ZOFRAN) tablet 4 mg  4 mg Oral Q6H PRN Ivor Costa, MD   4 mg at 08/20/17 4970   Or  . ondansetron (ZOFRAN) injection 4 mg  4 mg Intravenous Q6H PRN Ivor Costa, MD      . pantoprazole (PROTONIX) EC tablet 40 mg  40 mg Oral Daily Ivor Costa, MD   40 mg at 08/20/17 2637  . zolpidem (AMBIEN) tablet 5 mg  5 mg Oral QHS PRN Ivor Costa, MD         Discharge Medications: Please see discharge summary for a list of discharge medications.  Relevant Imaging Results:  Relevant Lab Results:   Additional Information SS# [place], [place]

## 2017-08-20 NOTE — Telephone Encounter (Signed)
Copied from [place] [phone]. Topic: Quick Communication - See Telephone Encounter >> Aug 19, 2017  3:35 PM Percell Belt A wrote: CRM for notification. See Telephone encounter for: 08/19/17. Pt wife called and she was upset.  Stated that the pt Is in the hosp.  She said that she is needing some kind of help to take care of him but she has to work and can not.  She said that the hosp was not helping her with those resources and she would like to know what is her next step to getting some help.     Please contact her at [phone]   >> Aug 20, 2017  1:58 PM Percell Belt A wrote: This was the pts Sister [name], it was not the wife.  Wife was just typed in error.    Pt back in hosp, he had a stroke in his back.  They are going to discharge him to a rehab for 20 days.

## 2017-08-20 NOTE — Progress Notes (Signed)
Physical Therapy Evaluation Patient Details Name: William [name] MRN: [id number] DOB: 1960/08/11 Today's Date: 08/20/2017   History of Present Illness  Pt is a 57 y/o male admitted with Subacute CVA involving brain and likely thoracic cord T2-T3 PMH: CKD III, LD, DM2, tobacco abuse, CVA with residual L side weakness, multiple frequent fall   Clinical Impression  Pt presented sitting OOB bed in recliner chair with HOB elevated, awake and willing to participate in therapy session. [name] present throughout session and providing history information as well as concerns. Pt lives alone in a mobile home with five steps to enter. Currently pt able to perform transfers with min A x2, ambulate in hallway with min A x2 with 2HHA and requires mod A x2 to ascend/descend three stairs. Pt reported multiple falls and remains at an increased risk for falls at this time. Pt would continue to benefit from skilled physical therapy services at this time while admitted and after d/c to address the below listed limitations in order to improve overall safety and independence with functional mobility.      Follow Up Recommendations SNF;Supervision/Assistance - 24 hour    Equipment Recommendations  None recommended by PT    Recommendations for Other Services       Precautions / Restrictions Precautions Precautions: Fall Precaution Comments: pt with multiple falls and often avoiding self reporting per sister.  Restrictions Weight Bearing Restrictions: No      Mobility  Bed Mobility               General bed mobility comments: pt presented sitting in recliner chair upon arrival  Transfers Overall transfer level: Needs assistance Equipment used: 2 person hand held assist Transfers: Sit to/from Stand Sit to Stand: +2 physical assistance;Min assist         General transfer comment: pt requiring assistance for stability with transitional movement  Ambulation/Gait Ambulation/Gait  assistance: Min assist;+2 safety/equipment Ambulation Distance (Feet): 150 Feet Assistive device: 2 person hand held assist Gait Pattern/deviations: Step-through pattern;Decreased stride length;Ataxic;Drifts right/left Gait velocity: decreased Gait velocity interpretation: <1.31 ft/sec, indicative of household ambulator General Gait Details: pt with modest instability especially with head turns (vertical and horizontal)  Stairs Stairs: Yes Stairs assistance: Mod assist;+2 physical assistance Stair Management: No rails;Step to pattern;Forwards(2HHA) Number of Stairs: 3 General stair comments: pt with modest instability and posterior LOB requiring mod A x2  Wheelchair Mobility    Modified Rankin (Stroke Patients Only)       Balance Overall balance assessment: Needs assistance Sitting-balance support: Feet supported Sitting balance-Leahy Scale: Fair     Standing balance support: During functional activity;Bilateral upper extremity supported Standing balance-Leahy Scale: Poor               High level balance activites: Direction changes;Turns;Head turns High Level Balance Comments: LOB with head turns             Pertinent Vitals/Pain Pain Assessment: No/denies pain    Home Living Family/patient expects to be discharged to:: Private residence Living Arrangements: Alone Available Help at Discharge: Family;Available PRN/intermittently Type of Home: Mobile home Home Access: Stairs to enter Entrance Stairs-Rails: None Entrance Stairs-Number of Steps: 5 Home Layout: One level Home Equipment: None Additional Comments: pt lives in a mobile home on cousin property per sister. pt reports that patient has experienced incontinence for 8 months and due to balance deficits has not cleaned the home. Sister reports patient falling partly over objects in the home. APS and DSS have been activated  per chart for this patient. Sister very much wants to help brother but currently  works. Sister William [name] present throughout session.    Prior Function Level of Independence: Independent               Hand Dominance   Dominant Hand: Right    Extremity/Trunk Assessment   Upper Extremity Assessment Upper Extremity Assessment: Defer to OT evaluation    Lower Extremity Assessment Lower Extremity Assessment: Generalized weakness LLE Deficits / Details: hx of L LE weakness from previous stroke    Cervical / Trunk Assessment Cervical / Trunk Assessment: Normal  Communication   Communication: No difficulties  Cognition Arousal/Alertness: Awake/alert Behavior During Therapy: WFL for tasks assessed/performed Overall Cognitive Status: History of cognitive impairments - at baseline                                 General Comments: pt with learning disability per sister. Pt is unable to read or write per sister - uncertain if this is completely due to learning disability or  the visual deficits. pt unable to recognize colors correctly with 4 out 7 during session.       General Comments General comments (skin integrity, edema, etc.): sunburn on LE due to prolonged time in the sun - down 4 hours prior to family locating patient    Exercises     Assessment/Plan    PT Assessment Patient needs continued PT services  PT Problem List Decreased activity tolerance;Decreased mobility;Decreased balance;Decreased cognition;Decreased coordination;Decreased safety awareness;Decreased knowledge of precautions       PT Treatment Interventions DME instruction;Gait training;Stair training;Functional mobility training;Therapeutic exercise;Therapeutic activities;Balance training;Neuromuscular re-education;Patient/family education;Cognitive remediation    PT Goals (Current goals can be found in the Care Plan section)  Acute Rehab PT Goals Patient Stated Goal: per sister - go to rehab PT Goal Formulation: With patient/family Time For Goal Achievement:  09/03/17 Potential to Achieve Goals: Good    Frequency Min 3X/week   Barriers to discharge Decreased caregiver support      Co-evaluation PT/OT/SLP Co-Evaluation/Treatment: Yes Reason for Co-Treatment: To address functional/ADL transfers;For patient/therapist safety PT goals addressed during session: Mobility/safety with mobility;Balance;Strengthening/ROM OT goals addressed during session: ADL's and self-care;Proper use of Adaptive equipment and DME;Strengthening/ROM       AM-PAC PT "6 Clicks" Daily Activity  Outcome Measure Difficulty turning over in bed (including adjusting bedclothes, sheets and blankets)?: A Little Difficulty moving from lying on back to sitting on the side of the bed? : A Little Difficulty sitting down on and standing up from a chair with arms (e.g., wheelchair, bedside commode, etc,.)?: Unable Help needed moving to and from a bed to chair (including a wheelchair)?: A Little Help needed walking in hospital room?: A Little Help needed climbing 3-5 steps with a railing? : A Lot 6 Click Score: 15    End of Session Equipment Utilized During Treatment: Gait belt Activity Tolerance: Patient tolerated treatment well Patient left: in chair;with call bell/phone within reach;with chair alarm set;with family/visitor present Nurse Communication: Mobility status PT Visit Diagnosis: Other abnormalities of gait and mobility (R26.89)    Time: 6270-3500 PT Time Calculation (min) (ACUTE ONLY): 29 min   Charges:   PT Evaluation $PT Eval Moderate Complexity: 1 Mod     PT G Codes:        Murtaugh, PT, DPT [place] 08/20/2017, 3:10 PM

## 2017-08-20 NOTE — Telephone Encounter (Signed)
Call placed to sister [name]. She did not answer and I did not leave a message. I was going to check on William [name]'s status and let her know that hospital should be able to help her with follow up care as they will determine treatment plan

## 2017-08-21 LAB — GLUCOSE, CAPILLARY
GLUCOSE-CAPILLARY: 282 mg/dL — AB (ref 65–99)
Glucose-Capillary: 214 mg/dL — ABNORMAL HIGH (ref 65–99)
Glucose-Capillary: 197 mg/dL — ABNORMAL HIGH (ref 65–99)
Glucose-Capillary: 199 mg/dL — ABNORMAL HIGH (ref 65–99)

## 2017-08-21 LAB — COPPER, SERUM: COPPER: 112 ug/dL (ref 72–166)

## 2017-08-21 MED ORDER — POLYETHYLENE GLYCOL 3350 17 G PO PACK
17.0000 g | PACK | Freq: Every day | ORAL | Status: DC
  Filled 2017-08-21 (×2): qty 1

## 2017-08-21 MED ORDER — INSULIN GLARGINE 100 UNIT/ML ~~LOC~~ SOLN
30.0000 [IU] | Freq: Every day | SUBCUTANEOUS | Status: DC
  Administered 2017-08-22 – 2017-08-23 (×2): 30 [IU] via SUBCUTANEOUS
  Filled 2017-08-21 (×2): qty 0.3

## 2017-08-21 NOTE — Social Work (Signed)
CSW to present offers to pt and pt family, pt cannot discharge without insurance authorization, this will be open and available on Monday.   CSW has faxed requested clinicals, formal request recieved from [facility], to [phone]. Release of information copy has been placed on chart.   Alexander Mt, [place] Work [phone]

## 2017-08-21 NOTE — Progress Notes (Signed)
PROGRESS NOTE    William [name]  PYP:[id number] DOB: 09/29/1960 DOA: 08/18/2017 PCP: Marin Olp, MD  Brief Narrative:William [name] is a 57 year old male with history of learning disability, type 2 diabetes, tobacco abuse, history of CVA with residual left-sided weakness, CK D stage III presented with frequent falls, balance problems, tingling numbness in both legs, incontinence Sister upset abt CBG in 200, continues to have balance issues  Assessment & Plan:     Subacute CVA involving brain and likely thoracic cord vs syrinx at T2 -MRI brain remarkable for very advanced chronic small vessel disease and a subacute lacunar infarcts, in addition MRI thoracic spine notes abnormal central cord signal at the T2-T3 level likely suggestive of subacute infarct, per Neuro this could be syrinx -NSG Dr.Cram consulted, recommended repeat imaging down the road and FU in office, poo surgical candidate -Has some overlap of symptoms likely related to his prior history of CVA and likely diabetic peripheral neuropathy -Continue aspirin changed to 325 mg daily -Neurology consult appreiated -2-D echocardiogram and carotid duplex unremarkable -LDL ok, and hemoglobin A1c is 9 -PT OT evaluation completed, SNF recommended -CSW consulted  Diabetes mellitus with renal complication ([place]):   -IWP8K is 9 -CBGs in 200, increase lantus dose further, metformin held -SSI  HLD: -lipitor  HTN:  -Continue home medications: Amlodipine, labetalol  -held Hyzaar due to worsening renal function  GERD: -Protonix  Tobacco abuse: -counseled, continue Nicotine patch  CKD (chronic kidney disease), stage III ([place]): Slightly worsened in the baseline. -Baseline creatinine 1.3, his creatinine is 1.71 -given 1 L normal saline -Hold Hyzaar -Monitor creatinine  Depression: Stable, -Continue Lexapro, Wellbutrin,  History of Stroke (cerebrum) ([place]):  -with mild residual (aplasia, continueasa and  lipitor  DVT ppx:  SQ Lovenox Code Status: DNR Family Communication:  sister at bedside Disposition Plan: SNF Monday or tuesday  Consultants:   Neuro   Procedures:   Antimicrobials:    Subjective: -no new issues -balance is a problem along with incontinence  Objective: Vitals:   08/20/17 0555 08/20/17 1300 08/20/17 2222 08/21/17 0508  BP: (!) 159/82 137/73 (!) 146/64 (!) 137/98  Pulse: (!) 59 65 69 66  Resp: 18  18 17   Temp: 98 F (36.7 C) 98.3 F (36.8 C) 97.6 F (36.4 C) 97.8 F (36.6 C)  TempSrc: Oral Oral Oral Oral  SpO2: 99% 100% 100% 99%  Weight:      Height:        Intake/Output Summary (Last 24 hours) at 08/21/2017 1137 Last data filed at 08/21/2017 1017 Gross per 24 hour  Intake 600 ml  Output 2000 ml  Net -1400 ml   Filed Weights   08/18/17 1854 08/19/17 1359  Weight: 84.8 kg (187 lb) 86 kg (189 lb 9.5 oz)    Examination:  Gen: Awake, Alert, Oriented X 2, positive cognitive dysfunction HEENT: PERRLA, Neck supple, no JVD Lungs: Good air movement bilaterally, CTAB CVS: RRR,No Gallops,Rubs or new Murmurs Abd: soft, Non tender, non distended, BS present Extremities: strength 5+ in both RLE extremities proximal and distal muscle groups, left is 4+, diminished light touch, plantars are withdrawal Skin: no new rashes Psychiatry:  Mood & affect appropriate, flat affect    Data Reviewed:   CBC: Recent Labs  Lab 08/18/17 1917 08/19/17 0319  WBC 13.1* 11.8*  HGB 15.3 13.4  HCT 43.8 39.3  MCV 92.8 92.9  PLT 238 998   Basic Metabolic Panel: Recent Labs  Lab 08/18/17 1917 08/19/17 0319  NA 140  141  K 3.9 3.5  CL 102 104  CO2 28 27  GLUCOSE 86 72  BUN 30* 32*  CREATININE 1.71* 1.86*  CALCIUM 9.4 8.7*   GFR: Estimated Creatinine Clearance: 47 mL/min (A) (by C-G formula based on SCr of 1.86 mg/dL (H)). Liver Function Tests: No results for input(s): AST, ALT, ALKPHOS, BILITOT, PROT, ALBUMIN in the last 168 hours. No results for  input(s): LIPASE, AMYLASE in the last 168 hours. No results for input(s): AMMONIA in the last 168 hours. Coagulation Profile: No results for input(s): INR, PROTIME in the last 168 hours. Cardiac Enzymes: Recent Labs  Lab 08/18/17 0126  CKTOTAL 63   BNP (last 3 results) No results for input(s): PROBNP in the last 8760 hours. HbA1C: Recent Labs    08/19/17 0319  HGBA1C 9.2*   CBG: Recent Labs  Lab 08/20/17 0817 08/20/17 1255 08/20/17 1726 08/20/17 2116 08/21/17 0730  GLUCAP 236* 300* 247* 315* 214*   Lipid Profile: Recent Labs    08/20/17 0430  CHOL 120  HDL 24*  LDLCALC 60  TRIG 181*  CHOLHDL 5.0   Thyroid Function Tests: Recent Labs    08/19/17 0319  TSH 4.327   Anemia Panel: Recent Labs    08/19/17 0319  VITAMINB12 517  FOLATE 15.0   Urine analysis:    Component Value Date/Time   COLORURINE YELLOW 08/18/2017 1903   APPEARANCEUR HAZY (A) 08/18/2017 1903   LABSPEC 1.024 08/18/2017 1903   PHURINE 5.0 08/18/2017 1903   GLUCOSEU 50 (A) 08/18/2017 1903   HGBUR NEGATIVE 08/18/2017 1903   BILIRUBINUR NEGATIVE 08/18/2017 1903   KETONESUR NEGATIVE 08/18/2017 1903   PROTEINUR 100 (A) 08/18/2017 1903   UROBILINOGEN 0.2 09/19/2010 0945   NITRITE NEGATIVE 08/18/2017 1903   LEUKOCYTESUR NEGATIVE 08/18/2017 1903   Sepsis Labs: @LABRCNTIP (procalcitonin:4,lacticidven:4)  )No results found for this or any previous visit (from the past 240 hour(s)).       Radiology Studies: No results found.      Scheduled Meds: . amLODipine  5 mg Oral Daily  . aspirin  325 mg Oral Daily  . atorvastatin  20 mg Oral q1800  . buPROPion  150 mg Oral Daily  . escitalopram  5 mg Oral Daily  . ferrous sulfate  325 mg Oral BID WC  . insulin aspart  0-9 Units Subcutaneous TID WC  . [START ON 08/22/2017] insulin glargine  30 Units Subcutaneous Daily  . labetalol  200 mg Oral BID  . nicotine  21 mg Transdermal Daily  . pantoprazole  40 mg Oral Daily  . polyethylene  glycol  17 g Oral Daily   Continuous Infusions:   LOS: 2 days    Time spent: 82min    Domenic Polite, MD [facility] Page via [email or web address], password [email or web address] After 7PM please contact night-coverage  08/21/2017, 11:37 AM

## 2017-08-22 LAB — GLUCOSE, CAPILLARY
GLUCOSE-CAPILLARY: 178 mg/dL — AB (ref 65–99)
GLUCOSE-CAPILLARY: 183 mg/dL — AB (ref 65–99)
Glucose-Capillary: 160 mg/dL — ABNORMAL HIGH (ref 65–99)
Glucose-Capillary: 213 mg/dL — ABNORMAL HIGH (ref 65–99)
Glucose-Capillary: 259 mg/dL — ABNORMAL HIGH (ref 65–99)

## 2017-08-22 MED ORDER — INSULIN ASPART 100 UNIT/ML ~~LOC~~ SOLN
4.0000 [IU] | Freq: Three times a day (TID) | SUBCUTANEOUS | Status: DC
  Administered 2017-08-22 – 2017-08-23 (×5): 4 [IU] via SUBCUTANEOUS

## 2017-08-22 NOTE — Plan of Care (Signed)
  Problem: Education: Goal: Knowledge of General Education information will improve Outcome: Progressing Note:  POC reviewed with pt.   

## 2017-08-22 NOTE — Progress Notes (Signed)
PROGRESS NOTE    William [name]  VZD:[id number] DOB: 21-Jan-1961 DOA: 08/18/2017 PCP: Marin Olp, MD  Brief Narrative:William [name] is a 57 year old male with history of learning disability, type 2 diabetes, tobacco abuse, history of CVA with residual left-sided weakness, CK D stage III presented with frequent falls, balance problems, tingling numbness in both legs, incontinence Sister upset abt CBG in 200, continues to have balance issues  Assessment & Plan:     Subacute CVA involving brain and likely thoracic cord vs syrinx at T2 -MRI brain remarkable for very advanced chronic small vessel disease and a subacute lacunar infarcts, in addition MRI thoracic spine notes abnormal central cord signal at the T2-T3 level likely suggestive of subacute infarct, per Neuro this could be syrinx -neurology consulted appreciate Dr. Cecil Cobbs input -[place] consulted, recommended repeat imaging down the road and FU in office, poo surgical candidate -Has some overlap of symptoms likely related to his prior history of CVA and likely diabetic peripheral neuropathy -Continue aspirin changed to 325 mg daily -2-D echocardiogram and carotid duplex unremarkable -LDL ok, and hemoglobin A1c is 9 -PT OT evaluation completed, SNF recommended -CSW consulted, SNF tomorrow if bed available  Diabetes mellitus with renal complication ([place]):   -IRJ1O is 9 -continue Lantus at increased dose, added meal coverage NovoLog -SSI  HLD: -lipitor  HTN:  -Continue home medications: Amlodipine, labetalol  -held Hyzaar due to worsening renal function  GERD: -Protonix  Tobacco abuse: -counseled, continue Nicotine patch  CKD (chronic kidney disease), stage III ([place]): Slightly worsened in the baseline. -Baseline creatinine 1.3, his creatinine is 1.71 -given 1 L normal saline -Hold Hyzaar -Monitor creatinine  Depression: Stable, -Continue Lexapro, Wellbutrin,  History of Stroke (cerebrum)  ([place]):  -with mild residual (aplasia, continueasa and lipitor  DVT ppx:  SQ Lovenox Code Status: DNR Family Communication:  sister at bedside Yesterday Disposition Plan: SNF Monday or tuesday  Consultants:   Neuro   Procedures:   Antimicrobials:    Subjective: -no new issues, was able to ambulate with physical therapy some more yesterday  Objective: Vitals:   08/21/17 0508 08/21/17 1423 08/21/17 2052 08/22/17 0552  BP: (!) 137/98 136/84 (!) 154/89 (!) 148/79  Pulse: 66 68 71 66  Resp: 17 17 17 17   Temp: 97.8 F (36.6 C) 98.7 F (37.1 C) 98.1 F (36.7 C) 98.9 F (37.2 C)  TempSrc: Oral Oral Oral Oral  SpO2: 99% 100% 100% 99%  Weight:      Height:        Intake/Output Summary (Last 24 hours) at 08/22/2017 1104 Last data filed at 08/22/2017 0300 Gross per 24 hour  Intake 240 ml  Output 3400 ml  Net -3160 ml   Filed Weights   08/18/17 1854 08/19/17 1359  Weight: 84.8 kg (187 lb) 86 kg (189 lb 9.5 oz)    Examination:  Gen: Awake, Alert, Oriented X 2, cognitive dysfunction  HEENT: PERRLA, Neck supple, no JVD Lungs: Good air movement bilaterally, CTAB CVS: RRR,No Gallops,Rubs or new Murmurs Abd: soft, Non tender, non distended, BS present Extremities: strength 5+ in both RLE extremities proximal and distal muscle groups, left is 4+, diminished light touch, plantars are withdrawal Skin: no rashes Psychiatry:  Mood & affect appropriate, flat affect    Data Reviewed:   CBC: Recent Labs  Lab 08/18/17 1917 08/19/17 0319  WBC 13.1* 11.8*  HGB 15.3 13.4  HCT 43.8 39.3  MCV 92.8 92.9  PLT 238 841   Basic Metabolic Panel: Recent  Labs  Lab 08/18/17 1917 08/19/17 0319  NA 140 141  K 3.9 3.5  CL 102 104  CO2 28 27  GLUCOSE 86 72  BUN 30* 32*  CREATININE 1.71* 1.86*  CALCIUM 9.4 8.7*   GFR: Estimated Creatinine Clearance: 47 mL/min (A) (by C-G formula based on SCr of 1.86 mg/dL (H)). Liver Function Tests: No results for input(s): AST, ALT,  ALKPHOS, BILITOT, PROT, ALBUMIN in the last 168 hours. No results for input(s): LIPASE, AMYLASE in the last 168 hours. No results for input(s): AMMONIA in the last 168 hours. Coagulation Profile: No results for input(s): INR, PROTIME in the last 168 hours. Cardiac Enzymes: Recent Labs  Lab 08/18/17 0126  CKTOTAL 63   BNP (last 3 results) No results for input(s): PROBNP in the last 8760 hours. HbA1C: No results for input(s): HGBA1C in the last 72 hours. CBG: Recent Labs  Lab 08/21/17 0730 08/21/17 1207 08/21/17 1621 08/21/17 2156 08/22/17 0747  GLUCAP 214* 282* 197* 199* 160*   Lipid Profile: Recent Labs    08/20/17 0430  CHOL 120  HDL 24*  LDLCALC 60  TRIG 181*  CHOLHDL 5.0   Thyroid Function Tests: No results for input(s): TSH, T4TOTAL, FREET4, T3FREE, THYROIDAB in the last 72 hours. Anemia Panel: No results for input(s): VITAMINB12, FOLATE, FERRITIN, TIBC, IRON, RETICCTPCT in the last 72 hours. Urine analysis:    Component Value Date/Time   COLORURINE YELLOW 08/18/2017 1903   APPEARANCEUR HAZY (A) 08/18/2017 1903   LABSPEC 1.024 08/18/2017 1903   PHURINE 5.0 08/18/2017 1903   GLUCOSEU 50 (A) 08/18/2017 1903   HGBUR NEGATIVE 08/18/2017 1903   BILIRUBINUR NEGATIVE 08/18/2017 1903   KETONESUR NEGATIVE 08/18/2017 1903   PROTEINUR 100 (A) 08/18/2017 1903   UROBILINOGEN 0.2 09/19/2010 0945   NITRITE NEGATIVE 08/18/2017 1903   LEUKOCYTESUR NEGATIVE 08/18/2017 1903   Sepsis Labs: @LABRCNTIP (procalcitonin:4,lacticidven:4)  )No results found for this or any previous visit (from the past 240 hour(s)).       Radiology Studies: No results found.      Scheduled Meds: . amLODipine  5 mg Oral Daily  . aspirin  325 mg Oral Daily  . atorvastatin  20 mg Oral q1800  . buPROPion  150 mg Oral Daily  . escitalopram  5 mg Oral Daily  . ferrous sulfate  325 mg Oral BID WC  . insulin aspart  0-9 Units Subcutaneous TID WC  . insulin aspart  4 Units Subcutaneous  TID WC  . insulin glargine  30 Units Subcutaneous Daily  . labetalol  200 mg Oral BID  . nicotine  21 mg Transdermal Daily  . pantoprazole  40 mg Oral Daily  . polyethylene glycol  17 g Oral Daily   Continuous Infusions:   LOS: 3 days    Time spent: 38min    Domenic Polite, MD [facility] Page via [email or web address], password [email or web address] After 7PM please contact night-coverage  08/22/2017, 11:04 AM

## 2017-08-22 NOTE — Progress Notes (Addendum)
Spoke with patient's sister William [name]) and the only rehab facility she would like for William [name] to be transferred to is [facility] ([place]). The sister states she has a friend named William [name] (cell: [phone]) that works at the facility and they have beds available.   Informed weekend CSW of this information. Sister would like to be informed ASAP.

## 2017-08-22 NOTE — Clinical Social Work Note (Signed)
CSW spoke with patient's sister [name] [phone] and presented bed offers, she decided to have patient go to [facility], due to patient knowing a couple of people who work at [facility].  CSW contacted Tammy [phone] at [facility], and they can accept patient on Monday, pending insurance authorization.  SNF is also requesting updated PT notes from Monday for insurance authorization.  Weekday social worker to continue to follow patient's progress throughout discharge planning.  Jones Broom. [place], MSW, [place]  08/22/2017 12:51 PM

## 2017-08-23 ENCOUNTER — Telehealth: Payer: Self-pay | Admitting: Family Medicine

## 2017-08-23 ENCOUNTER — Ambulatory Visit: Payer: Medicare Other | Admitting: Family Medicine

## 2017-08-23 DIAGNOSIS — Z743 Need for continuous supervision: Secondary | ICD-10-CM | POA: Diagnosis not present

## 2017-08-23 DIAGNOSIS — R488 Other symbolic dysfunctions: Secondary | ICD-10-CM | POA: Diagnosis not present

## 2017-08-23 DIAGNOSIS — E1122 Type 2 diabetes mellitus with diabetic chronic kidney disease: Secondary | ICD-10-CM | POA: Diagnosis not present

## 2017-08-23 DIAGNOSIS — R2689 Other abnormalities of gait and mobility: Secondary | ICD-10-CM | POA: Diagnosis not present

## 2017-08-23 DIAGNOSIS — I69354 Hemiplegia and hemiparesis following cerebral infarction affecting left non-dominant side: Secondary | ICD-10-CM | POA: Diagnosis not present

## 2017-08-23 DIAGNOSIS — M6281 Muscle weakness (generalized): Secondary | ICD-10-CM | POA: Diagnosis not present

## 2017-08-23 DIAGNOSIS — I10 Essential (primary) hypertension: Secondary | ICD-10-CM | POA: Diagnosis not present

## 2017-08-23 DIAGNOSIS — I639 Cerebral infarction, unspecified: Secondary | ICD-10-CM | POA: Diagnosis not present

## 2017-08-23 DIAGNOSIS — N183 Chronic kidney disease, stage 3 (moderate): Secondary | ICD-10-CM | POA: Diagnosis not present

## 2017-08-23 DIAGNOSIS — E1165 Type 2 diabetes mellitus with hyperglycemia: Secondary | ICD-10-CM | POA: Diagnosis not present

## 2017-08-23 DIAGNOSIS — K219 Gastro-esophageal reflux disease without esophagitis: Secondary | ICD-10-CM | POA: Diagnosis not present

## 2017-08-23 DIAGNOSIS — R279 Unspecified lack of coordination: Secondary | ICD-10-CM | POA: Diagnosis not present

## 2017-08-23 DIAGNOSIS — Z9181 History of falling: Secondary | ICD-10-CM | POA: Diagnosis not present

## 2017-08-23 DIAGNOSIS — F320 Major depressive disorder, single episode, mild: Secondary | ICD-10-CM | POA: Diagnosis not present

## 2017-08-23 DIAGNOSIS — E785 Hyperlipidemia, unspecified: Secondary | ICD-10-CM | POA: Diagnosis not present

## 2017-08-23 DIAGNOSIS — E1169 Type 2 diabetes mellitus with other specified complication: Secondary | ICD-10-CM | POA: Diagnosis not present

## 2017-08-23 DIAGNOSIS — D638 Anemia in other chronic diseases classified elsewhere: Secondary | ICD-10-CM | POA: Diagnosis not present

## 2017-08-23 DIAGNOSIS — I6389 Other cerebral infarction: Secondary | ICD-10-CM | POA: Diagnosis not present

## 2017-08-23 DIAGNOSIS — W19XXXD Unspecified fall, subsequent encounter: Secondary | ICD-10-CM | POA: Diagnosis not present

## 2017-08-23 DIAGNOSIS — R296 Repeated falls: Secondary | ICD-10-CM | POA: Diagnosis not present

## 2017-08-23 DIAGNOSIS — E1129 Type 2 diabetes mellitus with other diabetic kidney complication: Secondary | ICD-10-CM | POA: Diagnosis not present

## 2017-08-23 DIAGNOSIS — E1159 Type 2 diabetes mellitus with other circulatory complications: Secondary | ICD-10-CM | POA: Diagnosis not present

## 2017-08-23 LAB — BASIC METABOLIC PANEL
Anion gap: 10 (ref 5–15)
BUN: 18 mg/dL (ref 6–20)
CHLORIDE: 99 mmol/L — AB (ref 101–111)
CO2: 29 mmol/L (ref 22–32)
Calcium: 8.8 mg/dL — ABNORMAL LOW (ref 8.9–10.3)
Creatinine, Ser: 1.56 mg/dL — ABNORMAL HIGH (ref 0.61–1.24)
GFR calc non Af Amer: 47 mL/min — ABNORMAL LOW (ref >60)
GFR, EST AFRICAN AMERICAN: 55 mL/min — AB (ref >60)
Glucose, Bld: 167 mg/dL — ABNORMAL HIGH (ref 65–99)
Potassium: 3.9 mmol/L (ref 3.5–5.1)
SODIUM: 138 mmol/L (ref 135–145)

## 2017-08-23 LAB — GLUCOSE, CAPILLARY
GLUCOSE-CAPILLARY: 195 mg/dL — AB (ref 65–99)
GLUCOSE-CAPILLARY: 256 mg/dL — AB (ref 65–99)

## 2017-08-23 MED ORDER — INSULIN ASPART 100 UNIT/ML ~~LOC~~ SOLN: 0.0000 [IU] | Freq: Three times a day (TID) | SUBCUTANEOUS | 0 refills | Status: DC

## 2017-08-23 MED ORDER — ASPIRIN 325 MG PO TABS: 325.0000 mg | ORAL_TABLET | Freq: Every day | ORAL | 0 refills | Status: DC

## 2017-08-23 MED ORDER — ACETAMINOPHEN 325 MG PO TABS: 650.0000 mg | ORAL_TABLET | Freq: Four times a day (QID) | ORAL | Status: DC | PRN

## 2017-08-23 MED ORDER — INSULIN GLARGINE 100 UNIT/ML ~~LOC~~ SOLN: SUBCUTANEOUS | Status: DC

## 2017-08-23 NOTE — Progress Notes (Signed)
PTAR arrived to transport pt to [place].  Called pt's sister, [name] to let her know.  Attempted to call report, but number given is a fax number.  Pt discharged in stable condition via PTAR to [facility].  El[place]

## 2017-08-23 NOTE — Telephone Encounter (Signed)
Copied from  [id number]. Topic: Inquiry >> Aug 23, 2017 12:31 PM Pricilla Handler wrote: Reason for CRM: Edwena Felty with [facility] [phone])  called requesting to speak with Birdie Hopes. [place], but no answer. Someone from the office please call Edwena Felty at [phone].       Thank You!!!  >> Aug 23, 2017  1:42 PM Bea Graff, NT wrote: Edwena Felty with [facility] calling back and requesting Cassandra to return her call. She is calling to let Dr. Yong Channel know this pt had been admitted to the hospital for recurrent falls and for a stroke. After he leaves a rehab facility he will need hospital follow-up and transition of care. She states that Dr. Yong Channel may refer him back to [facility] once pt leaves rehab. Pt had an elevated A1C while in the hospital of 9.2. CB#: [phone]

## 2017-08-23 NOTE — Clinical Social Work Placement (Signed)
Nurse to call report to [phone], Room 103  Nurse to please call sister [name] when patient picked up by PTAR, at [place]  NOTE  Date:  08/23/2017  Patient Details  Name: William [name] MRN: [id number] Date of Birth: 05/22/1960  Clinical Social Work is seeking post-discharge placement for this patient at the [place] level of care (*CSW will initial, date and re-position this form in  chart as items are completed):  Yes   Patient/family provided with [place] Work Department's list of facilities offering this level of care within the geographic area requested by the patient (or if unable, by the patient's family).  Yes   Patient/family informed of their freedom to choose among providers that offer the needed level of care, that participate in Medicare, Medicaid or managed care program needed by the patient, have an available bed and are willing to accept the patient.  Yes   Patient/family informed of [place]'s ownership interest in [facility] and [facility], as well as of the fact that they are under no obligation to receive care at these facilities.  PASRR submitted to EDS on       PASRR number received on 08/19/17     Existing PASRR number confirmed on       FL2 transmitted to all facilities in geographic area requested by pt/family on 08/19/17     FL2 transmitted to all facilities within larger geographic area on       Patient informed that his/her managed care company has contracts with or will negotiate with certain facilities, including the following:        Yes   Patient/family informed of bed offers received.  Patient chooses bed at [facility])     Physician recommends and patient chooses bed at      Patient to be transferred to [facility]) on 08/23/17.  Patient to be transferred to facility by PTAR     Patient family notified on 08/23/17 of transfer.  Name  of family member notified:  sister, [name]     PHYSICIAN Please prepare priority discharge summary, including medications     Additional Comment:    _______________________________________________ Geralynn Ochs, LCSW 08/23/2017, 1:34 PM

## 2017-08-23 NOTE — Telephone Encounter (Signed)
I called and spoke to [place]. She states that if patient needs help managing diabetes post rehab then please place a new [facility] referral.

## 2017-08-23 NOTE — Plan of Care (Signed)
  Problem: Clinical Measurements: Goal: Respiratory complications will improve Outcome: Progressing Goal: Cardiovascular complication will be avoided Outcome: Progressing   Problem: Coping: Goal: Level of anxiety will decrease Outcome: Progressing   

## 2017-08-23 NOTE — Care Management Important Message (Signed)
Important Message  Patient Details  Name: William [name] MRN: [id number] Date of Birth: 1960/05/08   Medicare Important Message Given:  Yes    Berania Peedin 08/23/2017, 11:58 AM

## 2017-08-23 NOTE — Progress Notes (Signed)
Inpatient Diabetes Program Recommendations  AACE/ADA: New Consensus Statement on Inpatient Glycemic Control (2019)  Target Ranges:  Prepandial:   less than 140 mg/dL      Peak postprandial:   less than 180 mg/dL (1-2 hours)      Critically ill patients:  140 - 180 mg/dL   Results for William [name], William [name] (MRN [id number]) as of 08/23/2017 09:14  Ref. Range 08/22/2017 07:47 08/22/2017 11:58 08/22/2017 16:29 08/22/2017 21:29 08/22/2017 22:07 08/23/2017 07:55  Glucose-Capillary Latest Ref Range: 65 - 99 mg/dL 160 (H) 213 (H) 259 (H) 178 (H) 183 (H) 195 (H)  Results for William [name], William [name] (MRN [id number]) as of 08/23/2017 09:14  Ref. Range 05/25/2017 16:12 08/19/2017 03:19  Hemoglobin A1C Latest Ref Range: 4.8 - 5.6 % 7.9 (H) 9.2 (H)   Review of Glycemic Control  Diabetes history: DM2 Outpatient Diabetes medications: Latnus 35-40 units daily (if CBG >90 mg/dl), Metformin 1000 mg BID Current orders for Inpatient glycemic control: Lantus 30 units daily, Novolog 0-9 units TID with meals, Novolog 4 units TID with meals for meal coverage  Inpatient Diabetes Program Recommendations: Correction (SSI): Please consider ordering Novolog 0-5 units QHS for bedtime correction scale. Insulin - Meal Coverage: Please consider increasing meal coverage to Novolog 7 units TID with meals if patient eats at least 50% of meals. HgbA1C: A1C 9.2% on 08/19/17 indicating an average glucose of 217 mg/dl over the past 2-3 months. Patient needs to follow up with PCP regarding DM control and likely needs adjustments to outpatient DM medication regimen.   NOTE: In reviewing chart, noted patient has learning disability and will be discharging to SNF when appropriate.  A1C 9.2% on 08/19/17 indicating an average glucose of 217 mg/dl. If patient's glucose is well controlled with inpatient insulin regimen, MD may want to consider discharging patient on same regimen.  Thanks, Barnie Alderman, RN, MSN, CDE Diabetes Coordinator Inpatient Diabetes  Program [phone] (Team Pager from 8am to 5pm)

## 2017-08-23 NOTE — Consult Note (Signed)
[facility] CM Primary Care Navigator  08/23/2017  William [name] [birth year] [id number]   Met withpatient at the bedside toidentify possible discharge needs. Patient reports that "left knee gave way and fell, but could not get up" that resulted to this admission. (recurrent falls, subacute CVA-cerebrovascular accident).  PatientendorsesDr.Stephen [name] with [employer] [facility] [employer] as theprimary care provider.   [facility] [place] [facility] [employer] [facility] medications without difficulty.  Patientreports that cousin William [name]- lives next door) has been managinghis medications at [employer] use of "pill box" system filled every 2 weeks.  He states that sister William [name]) has been providingtransportationto hisdoctors'appointments.  Patientmentioned living alone but sister serves as Surveyor, mining home and cousin provides assistance with care as needed.  Anticipated plan for discharge isskilled nursing facility (SNF)for rehabilitation per therapy recommendation.  Patient voiced understandingto callprimarycareprovider'soffice when he returns backhome,for a post discharge follow-upvisitwithin1- 2 weeksor sooner if needs arise.Patient letter (with PCP's contact number) was provided asareminder.   Explained to [name] services available for health management/ resourcesat homeand he hasindicatedinterestforit.  Patient is aware to discuss with  primary care provider onnext visit, for further health managementservicesthathe willbe needing- once dischargedhome. Patientverbalizedunderstandingof need to seekreferral from primary care provider to [facility] care management ifdeemed necessary and appropriatefor anyservicesin the nearfuture,once he returns home.   [facility] care management information was provided for futureneedsthathemay have.  Primary care  provider's office called William [name] for [facility]) to notify of patient's discharge disposition, need for post hospital follow-up and transition of care (TOC). Notified also of patient's health issues needing follow-up (mainly DM and stroke). Made aware to refer patient to [facility]- CM as deemed appropriate and necessary to have the services.   For additional questions please contact:  William [name], BSN, RN-BC [facility] PRIMARY CARE Navigator Cell: [phone]

## 2017-08-23 NOTE — Discharge Summary (Signed)
Physician Discharge Summary  William [name] JKK:[id number] DOB: 28-Jun-1960 DOA: 08/18/2017  PCP: Marin Olp, MD  Admit date: 08/18/2017 Discharge date: 08/23/2017  Time spent: 35 minutes  Recommendations for Outpatient Follow-up:  1. Neurosurgery Dr.Cram in 1 month with repeat Imaging MRI Tspine 2. PCP in 1 week   Discharge Diagnoses:  Principal Problem:   Fall   Gait disorder   Incontinence   Learning disability   Subacute CVA   T2 spinal cord abnormality-subacute CVA vs Syrinx   Type II diabetes mellitus with renal manifestations (HCC)   Hyperlipidemia   Essential hypertension   GERD (gastroesophageal reflux disease)   Tobacco use disorder   CKD (chronic kidney disease), stage III (HCC)   Depression, major, single episode, mild (HCC)   Stroke (cerebrum) (HCC)   Urinary incontinence   Discharge Condition: stabke  Diet recommendation: DIabetic  Filed Weights   08/18/17 1854 08/19/17 1359  Weight: 84.8 kg (187 lb) 86 kg (189 lb 9.5 oz)    History of present illness:  William [name] is a 56 year old male with history of learning disability, type 2 diabetes, tobacco abuse, history of CVA with residual left-sided weakness, CK D stage III presented with frequent falls, balance problems, tingling numbness in both legs, incontinence for 1 month  Hospital Course:   Subacute CVA involving brain and likely thoracic cord vs syrinx at T2 -presented with frequent falls, balance problems, tingling numbness in both legs, incontinence for 1 month -MRI brain remarkable for very advanced chronic small vessel disease and a subacute lacunar infarcts, in addition MRI thoracic spine notes abnormal central cord signal at the T2-T3 level likely suggestive of subacute infarct, per Neurology this could be syrinx too -neurology consulted appreciate Dr. Cecil Cobbs input -[place] consulted, recommended repeat imaging down the road and FU in office, poor surgical candidate -Has some  overlap of symptoms likely related to his prior history of CVA and likely diabetic peripheral neuropathy -Continue aspirin changed to 325 mg daily -2-D echocardiogram and carotid duplex unremarkable -LDL ok, and hemoglobin A1c is 9 -PT OT evaluation completed, SNF recommended -CSW consulted, SNF today for Rehab  Diabetes mellitus withrenalcomplication ([place]): -ZJI9C is 9 -continue Lantus at increased dose, SSI  HLD: -lipitor  HTN:  -Continue home medications:Amlodipine, labetalol  -resumed Hyzaar  GERD: -Protonix  Tobacco abuse: -counseled, continue Nicotine patch  CKD (chronic kidney disease), stage III (HCC):Slightly worsened in the baseline. -Baseline creatinine 1.3, his creatinine is 1.71 -given IVF, held Hyzaar -improved to 1.5, resumed hyzaar at discharge  Depression:Stable, -Continue Lexapro, Wellbutrin,  History of Stroke (cerebrum) ([place]):  -with mild residual L hemiplegia and aplasia, continue ASA and lipitor  Code Status: DNR    Consultations:  Neurology  Neurosurgery Dr.Cram  Discharge Exam: Vitals:   08/22/17 2127 08/23/17 0521  BP: (!) 159/76 (!) 144/67  Pulse: 73 67  Resp: 18 16  Temp: 98.3 F (36.8 C) 98.3 F (36.8 C)  SpO2: 100% 97%    General: AAOx2 Cardiovascular: S1S2/RRR Respiratory: CTAB  Discharge Instructions   Discharge Instructions    Diet - low sodium heart healthy   Complete by:  As directed    Diet Carb Modified   Complete by:  As directed    Increase activity slowly   Complete by:  As directed      Allergies as of 08/23/2017      Reactions   Sulfamethoxazole    REACTION: rash      Medication List    STOP taking  these medications   ACCU-CHEK AVIVA PLUS w/Device Kit   metFORMIN 1000 MG tablet Commonly known as:  GLUCOPHAGE     TAKE these medications   acetaminophen 325 MG tablet Commonly known as:  TYLENOL Take 2 tablets (650 mg total) by mouth every 6 (six) hours as needed for mild pain  (or Fever >/= 101).   amLODipine 5 MG tablet Commonly known as:  NORVASC Take 5 mg by mouth daily.   aspirin 325 MG tablet Take 1 tablet (325 mg total) by mouth daily. Start taking on:  08/24/2017 What changed:    medication strength  how much to take   atorvastatin 20 MG tablet Commonly known as:  LIPITOR TAKE 1 TABLET BY MOUTH  DAILY   buPROPion 150 MG 24 hr tablet Commonly known as:  WELLBUTRIN XL Take 1 tablet (150 mg total) by mouth daily.   escitalopram 5 MG tablet Commonly known as:  LEXAPRO Take 1 tablet (5 mg total) by mouth daily.   ferrous sulfate 325 (65 FE) MG tablet Take 1 tablet (325 mg total) by mouth 2 (two) times daily with a meal.   insulin aspart 100 UNIT/ML injection Commonly known as:  novoLOG Inject 0-9 Units into the skin 3 (three) times daily with meals.   insulin glargine 100 UNIT/ML injection Commonly known as:  LANTUS 40units daily What changed:  additional instructions   labetalol 200 MG tablet Commonly known as:  NORMODYNE TAKE 1 TABLET BY MOUTH TWO  TIMES DAILY   losartan-hydrochlorothiazide 100-25 MG tablet Commonly known as:  HYZAAR TAKE 1 TABLET BY MOUTH  DAILY What changed:  how much to take   omeprazole 20 MG capsule Commonly known as:  PRILOSEC TAKE 1 CAPSULE BY MOUTH  DAILY   ONE TOUCH ULTRA TEST test strip Generic drug:  glucose blood TEST BLOOD SUGAR 3 TIMES A DAY      Allergies  Allergen Reactions  . Sulfamethoxazole     REACTION: rash    Contact information for follow-up providers    Marin Olp, MD. Schedule an appointment as soon as possible for a visit in 1 week(s).   Specialty:  Family Medicine Contact information: [place] [address] [phone]        Kary Kos, MD. Schedule an appointment as soon as possible for a visit in 1 month(s).   Specialty:  Neurosurgery Contact information: [address] [place] [address] [phone]             Contact information for after-discharge care    [facility] .   Service:  Skilled Nursing Contact information: [address] [place] [phone]                   The results of significant diagnostics from this hospitalization (including imaging, microbiology, ancillary and laboratory) are listed below for reference.    Significant Diagnostic Studies: Mr Brain 30 Contrast  Result Date: 08/19/2017 CLINICAL DATA:  57 year old male with learning disability, whose family noted difficulty walking over the past year. Ataxia, falls. Generalized weakness. Urinary incontinence. Memory problems. Diabetes, hypertension, stage 3 chronic kidney disease. EXAM: MRI HEAD WITHOUT CONTRAST TECHNIQUE: Multiplanar, multiecho pulse sequences of the brain and surrounding structures were obtained without intravenous contrast. COMPARISON:  Brain MRI 07/25/2017, 11/10/2004. Thoracic and lumbar MRI today reported separately. FINDINGS: Brain: Widespread chronic abnormal T2 and FLAIR hyperintensity indicative of extensive chronic  lacunar infarcts in the brainstem (primarily the pons), bilateral deep gray matter nuclei, posterior limb left internal capsule, bilateral corona radiata, and occasionally also the cerebellum. There is Wallerian degeneration suspected at the right midbrain and brainstem. Some chronic hemosiderin is noted in the right corona radiata. Diffusion-weighted imaging today suggests a small linear area of restricted diffusion in the right corona radiata bordering the prior small vessel infarcts (series 7001, image 52). Furthermore, there is a punctate area of trace diffusion signal in the ventral left thalamus (series 5001, image 65) which appears isointense on ADC. Diffusion abnormality was present at both of these sites last month. No other restricted diffusion. No midline shift, mass effect, evidence of mass lesion,  ventriculomegaly, extra-axial collection or acute intracranial hemorrhage. Cervicomedullary junction and pituitary are within normal limits. Vascular: Major intracranial vascular flow voids are stable since last month. The distal left vertebral artery appears chronically occluded. Skull and upper cervical spine: The visible cervical spine is remarkable for mild degenerative changes perhaps superimposed on some congenital canal narrowing. There is borderline to mild C3 and C4 level spinal stenosis which appears stable. See also the thoracic spine report today. Bone marrow signal is normal. Sinuses/Orbits: Continued subtotal opacification of the left maxillary sinus with some fluid. Mild ethmoid sinus mucosal thickening. Stable and negative orbits soft tissues. Other: Mastoid air cells remain clear. Visible internal auditory structures appear normal. IMPRESSION: 1. See abnormal Thoracic Spine MRI findings today reported separately. 2. Very advanced chronic small vessel disease affecting the deep white matter, deep gray matter, and brainstem with two superimposed subacute appearing lacunar infarcts. No hemorrhage or mass effect. Electronically Signed   By: Genevie Ann M.D.   On: 08/19/2017 08:28   Mr Jeri Cos DJ Contrast  Result Date: 07/25/2017 CLINICAL DATA:  Memory loss.  Difficulty walking. Creatinine was obtained on site at [facility] at [address] Results: Creatinine 1.3 mg/dL. EXAM: MRI HEAD WITHOUT AND WITH CONTRAST TECHNIQUE: Multiplanar, multiecho pulse sequences of the brain and surrounding structures were obtained without and with intravenous contrast. CONTRAST:  100m MULTIHANCE GADOBENATE DIMEGLUMINE 529 MG/ML IV SOLN COMPARISON:  11/10/2004 FINDINGS: Brain: Diffusion imaging does not show any acute or subacute infarction. Small foci of T2 shine through are seen adjacent to the body of the right lateral ventricle and in the inferior basal ganglia region. There are extensive chronic  small-vessel ischemic changes of the pons. No focal cerebellar insult. There are old infarctions affecting the thalami I, basal ganglia and throughout the hemispheric white matter. No large vessel territory infarction. No mass lesion, hemorrhage, hydrocephalus or extra-axial collection. Some hemosiderin deposition associated with some of the old deep brain infarctions. After contrast administration, no abnormal enhancement occurs. Vascular: Abnormal flow in the left vertebral artery. This was present in 2006. Other major vessels at the base of the brain show flow. Skull and upper cervical spine: Negative Sinuses/Orbits: Left maxillary sinusitis. Other sinuses are clear. The orbits are normal. Other: None IMPRESSION: No acute, subacute or reversible finding by MRI. Extensive chronic small-vessel ischemic changes throughout the brain as outlined above affecting the brainstem, thalami, basal ganglia and hemispheric white matter. The findings are considerably progressive since 2006. Left maxillary sinusitis. Electronically Signed   By: MNelson ChimesM.D.   On: 07/25/2017 17:44   Mr Lumbar Spine Wo Contrast  Addendum Date: 08/19/2017   ADDENDUM REPORT: 08/19/2017 12:58 ADDENDUM: Study discussed in person with Dr. MRoland Rackon 08/19/2017 at 1245 hours. Electronically Signed   By:  Genevie Ann M.D.   On: 08/19/2017 12:58   Addendum Date: 08/19/2017   ADDENDUM REPORT: 08/19/2017 09:23 ADDENDUM: Study discussed by telephone with Hospitalist Dr. Broadus John on 08/19/2017 at 0857 hours. Electronically Signed   By: Genevie Ann M.D.   On: 08/19/2017 09:23   Result Date: 08/19/2017 CLINICAL DATA:  57 year old male with learning disability, whose family has noted difficulty walking over the past year. Ataxia, falls. Generalized weakness. Urinary incontinence. Memory problems. Diabetes, hypertension, stage 3 chronic kidney disease. EXAM: MRI THORACIC SPINE WITHOUT AND WITH CONTRAST MRI LUMBAR SPINE WITHOUT CONTRAST. TECHNIQUE:  Multiplanar and multiecho pulse sequences of the thoracic and lumbar spine were obtained. The thoracic spine was imaged without and with intravenous contrast. CONTRAST:  53m MULTIHANCE GADOBENATE DIMEGLUMINE 529 MG/ML IV SOLN COMPARISON:  Brain MRI today and also 07/25/2017. Chest radiographs 05/25/2017, 09/19/2010. FINDINGS: MRI THORACIC SPINE FINDINGS Limited cervical spine imaging: Sagittal T1 and T2 weighted imaging was performed of the cervical spine in an attempt to quantify a degree of degenerative cervical spinal stenosis evident on the scout view. The sagittal T2 weighted imaging is degraded by motion. However, broad-based posterior disc protrusions are evident at C5-C6 and C6-C7 and result in at least mild cervical spinal stenosis with probably mild associated spinal cord mass effect. There is no definite abnormal signal in the cervical spinal cord. Thoracic spine segmentation:  Appears normal. Alignment: Thoracic vertebral height and alignment appears stable since 2012. Mildly exaggerated thoracic kyphosis. Vertebrae: Visualized bone marrow signal is within normal limits. No marrow edema or evidence of acute osseous abnormality. Cord: Confluent abnormal upper thoracic spinal cord signal consisting of nearly holo cord T2 and STIR hyperintensity from the T2-T3 to the T4-T5 levels. See series 19001 and series 21001, images 8 and 9. on axial images through this region there does appear to be a rim of preserved spinal cord white matter. The cord does appear mildly expanded in the affected area. There are fairly discrete margins to normal cord signal above the T2-T3 disc and below the mid T5 vertebra. There is no other abnormal thoracic spinal cord signal, despite degenerative spinal stenosis with cord mass effect at T5-T6 (see below). The conus medullaris is visible on the lumbar images. Following contrast there is no abnormal spinal cord or intradural enhancement. No dural thickening. Paraspinal and other  soft tissues: Negative. Disc levels: Notable thoracic spine degeneration as follows: T2-T3: Moderate sized right paracentral to subarticular disc protrusion with endplate spurring. Mild spinal stenosis with borderline to mild cord mass effect. Facet hypertrophy. Mild left T3 foraminal stenosis. T3 and T4 levels: Moderate facet and ligament flavum hypertrophy, but no spinal stenosis. T5-T6: Right paracentral cephalad disc extrusion (series 19001, image 8) superimposed on rightward disc bulging, endplate spurring, moderate to severe ligament flavum hypertrophy, mild facet hypertrophy, and epidural lipomatosis. There is moderate to severe spinal stenosis and moderate cord mass effect (series 23001, image 16). From T5 inferiorly there is thoracic epidural lipomatosis which contributes to effaced CSF from the thecal sac. T8-T9: Mild left paracentral disc bulge or protrusion. Mild spinal stenosis with minimal left hemi cord mass effect. T9-T10: More broad-based posterior disc bulging and endplate spurring with epidural lipomatosis and moderate posterior element hypertrophy. Mild spinal stenosis with mild ventral cord mass effect. MRI LUMBAR SPINE FINDINGS Segmentation: Normal, concordant with the thoracic spine numbering. There is a vestigial S1-S2 disc space, but the S1 level is otherwise fully sacralized. Alignment:  Normal lumbar lordosis. Vertebrae: Visualized bone marrow signal is within normal  limits. No marrow edema or evidence of acute osseous abnormality. Intact visible sacrum and SI joints. Conus medullaris: Extends to the L2 level and appears normal. No lower spinal cord or conus signal abnormality. Paraspinal and other soft tissues: There is a degree of bilateral renal atrophy. Small T2 hyperintense probable benign right renal cysts are noted. Negative visible other abdominal viscera. Negative visualized posterior paraspinal soft tissues. Disc levels: Mild lumbar spine degeneration overall. However,  degenerative stenosis as follows: L4-L5: Circumferential disc bulge with broad-based posterior and biforaminal involvement. Moderate ligament flavum and moderate to severe facet hypertrophy. Tiny subligamentous synovial cysts not contributing to stenosis at this time (series 27001, image 24). Mild to moderate spinal and bilateral lateral recess stenosis (descending L5 nerve levels). Borderline to mild bilateral L4 neural foraminal stenosis, greater on the right. IMPRESSION: 1. Abnormal upper thoracic spinal cord signal affecting a 4.5 cm segment from T2-T3 to T4-T5. Central cord involvement with borderline to mild cord expansion. No enhancement. No other spinal cord signal abnormality, despite multilevel degenerative spinal stenosis elsewhere (see #2). In light of the patient history and Brain MRI findings today a subacute or chronic spinal cord infarct seems most likely. 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 The lack of enhancement argues against an acute inflammatory process of the cord. 2. Superimposed multilevel degenerative spinal stenosis, exacerbated in the thoracic spine by epidural lipomatosis. Predominantly mild degenerative cord mass effect occurs, but at T5-T6 there is moderate to severe stenosis with moderate cord mass effect, but no cord signal abnormality at that level. 3. Moderate degenerative lumbar spinal and lateral recess stenosis at L4-L5. 4.  No acute osseous abnormality identified in the spine. Electronically Signed: By: Genevie Ann M.D. On: 08/19/2017 08:48   Mr Thoracic Spine W Wo Contrast  Addendum Date: 08/19/2017   ADDENDUM REPORT: 08/19/2017 12:58 ADDENDUM: Study discussed in person with Dr. Roland Rack on 08/19/2017 at 1245 hours. Electronically Signed   By: Genevie Ann M.D.   On: 08/19/2017 12:58   Addendum Date:  08/19/2017   ADDENDUM REPORT: 08/19/2017 09:23 ADDENDUM: Study discussed by telephone with Hospitalist Dr. Broadus John on 08/19/2017 at 0857 hours. Electronically Signed   By: Genevie Ann M.D.   On: 08/19/2017 09:23   Result Date: 08/19/2017 CLINICAL DATA:  57 year old male with learning disability, whose family has noted difficulty walking over the past year. Ataxia, falls. Generalized weakness. Urinary incontinence. Memory problems. Diabetes, hypertension, stage 3 chronic kidney disease. EXAM: MRI THORACIC SPINE WITHOUT AND WITH CONTRAST MRI LUMBAR SPINE WITHOUT CONTRAST. TECHNIQUE: Multiplanar and multiecho pulse sequences of the thoracic and lumbar spine were obtained. The thoracic spine was imaged without and with intravenous contrast. CONTRAST:  41m MULTIHANCE GADOBENATE DIMEGLUMINE 529 MG/ML IV SOLN COMPARISON:  Brain MRI today and also 07/25/2017. Chest radiographs 05/25/2017, 09/19/2010. FINDINGS: MRI THORACIC SPINE FINDINGS Limited cervical spine imaging: Sagittal T1 and T2 weighted imaging was performed of the cervical spine in an attempt to quantify a degree of degenerative cervical spinal stenosis evident on the scout view. The sagittal T2 weighted imaging is degraded by motion. However, broad-based posterior disc protrusions are evident at C5-C6 and C6-C7 and result in at least mild cervical spinal stenosis with probably mild associated spinal cord mass effect. There is no definite abnormal signal in the cervical spinal cord. Thoracic spine segmentation:  Appears normal. Alignment: Thoracic vertebral  height and alignment appears stable since 2012. Mildly exaggerated thoracic kyphosis. Vertebrae: Visualized bone marrow signal is within normal limits. No marrow edema or evidence of acute osseous abnormality. Cord: Confluent abnormal upper thoracic spinal cord signal consisting of nearly holo cord T2 and STIR hyperintensity from the T2-T3 to the T4-T5 levels. See series 19001 and series 21001, images 8 and 9. on  axial images through this region there does appear to be a rim of preserved spinal cord white matter. The cord does appear mildly expanded in the affected area. There are fairly discrete margins to normal cord signal above the T2-T3 disc and below the mid T5 vertebra. There is no other abnormal thoracic spinal cord signal, despite degenerative spinal stenosis with cord mass effect at T5-T6 (see below). The conus medullaris is visible on the lumbar images. Following contrast there is no abnormal spinal cord or intradural enhancement. No dural thickening. Paraspinal and other soft tissues: Negative. Disc levels: Notable thoracic spine degeneration as follows: T2-T3: Moderate sized right paracentral to subarticular disc protrusion with endplate spurring. Mild spinal stenosis with borderline to mild cord mass effect. Facet hypertrophy. Mild left T3 foraminal stenosis. T3 and T4 levels: Moderate facet and ligament flavum hypertrophy, but no spinal stenosis. T5-T6: Right paracentral cephalad disc extrusion (series 19001, image 8) superimposed on rightward disc bulging, endplate spurring, moderate to severe ligament flavum hypertrophy, mild facet hypertrophy, and epidural lipomatosis. There is moderate to severe spinal stenosis and moderate cord mass effect (series 23001, image 16). From T5 inferiorly there is thoracic epidural lipomatosis which contributes to effaced CSF from the thecal sac. T8-T9: Mild left paracentral disc bulge or protrusion. Mild spinal stenosis with minimal left hemi cord mass effect. T9-T10: More broad-based posterior disc bulging and endplate spurring with epidural lipomatosis and moderate posterior element hypertrophy. Mild spinal stenosis with mild ventral cord mass effect. MRI LUMBAR SPINE FINDINGS Segmentation: Normal, concordant with the thoracic spine numbering. There is a vestigial S1-S2 disc space, but the S1 level is otherwise fully sacralized. Alignment:  Normal lumbar lordosis.  Vertebrae: Visualized bone marrow signal is within normal limits. No marrow edema or evidence of acute osseous abnormality. Intact visible sacrum and SI joints. Conus medullaris: Extends to the L2 level and appears normal. No lower spinal cord or conus signal abnormality. Paraspinal and other soft tissues: There is a degree of bilateral renal atrophy. Small T2 hyperintense probable benign right renal cysts are noted. Negative visible other abdominal viscera. Negative visualized posterior paraspinal soft tissues. Disc levels: Mild lumbar spine degeneration overall. However, degenerative stenosis as follows: L4-L5: Circumferential disc bulge with broad-based posterior and biforaminal involvement. Moderate ligament flavum and moderate to severe facet hypertrophy. Tiny subligamentous synovial cysts not contributing to stenosis at this time (series 27001, image 24). Mild to moderate spinal and bilateral lateral recess stenosis (descending L5 nerve levels). Borderline to mild bilateral L4 neural foraminal stenosis, greater on the right. IMPRESSION: 1. Abnormal upper thoracic spinal cord signal affecting a 4.5 cm segment from T2-T3 to T4-T5. Central cord involvement with borderline to mild cord expansion. No enhancement. No other spinal cord signal abnormality, despite multilevel degenerative spinal stenosis elsewhere (see #2). In light of the patient history and Brain MRI findings today a subacute or chronic spinal cord infarct seems most likely. 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 The lack of enhancement argues against  an acute inflammatory process of the cord. 2. Superimposed multilevel degenerative spinal stenosis, exacerbated in the thoracic spine by epidural lipomatosis. Predominantly mild degenerative cord mass effect occurs, but at T5-T6 there  is moderate to severe stenosis with moderate cord mass effect, but no cord signal abnormality at that level. 3. Moderate degenerative lumbar spinal and lateral recess stenosis at L4-L5. 4.  No acute osseous abnormality identified in the spine. Electronically Signed: By: Genevie Ann M.D. On: 08/19/2017 08:48    Microbiology: No results found for this or any previous visit (from the past 240 hour(s)).   Labs: Basic Metabolic Panel: Recent Labs  Lab 08/18/17 1917 08/19/17 0319 08/23/17 0407  NA 140 141 138  K 3.9 3.5 3.9  CL 102 104 99*  CO2 '28 27 29  ' GLUCOSE 86 72 167*  BUN 30* 32* 18  CREATININE 1.71* 1.86* 1.56*  CALCIUM 9.4 8.7* 8.8*   Liver Function Tests: No results for input(s): AST, ALT, ALKPHOS, BILITOT, PROT, ALBUMIN in the last 168 hours. No results for input(s): LIPASE, AMYLASE in the last 168 hours. No results for input(s): AMMONIA in the last 168 hours. CBC: Recent Labs  Lab 08/18/17 1917 08/19/17 0319  WBC 13.1* 11.8*  HGB 15.3 13.4  HCT 43.8 39.3  MCV 92.8 92.9  PLT 238 196   Cardiac Enzymes: Recent Labs  Lab 08/18/17 0126  CKTOTAL 63   BNP: BNP (last 3 results) No results for input(s): BNP in the last 8760 hours.  ProBNP (last 3 results) No results for input(s): PROBNP in the last 8760 hours.  CBG: Recent Labs  Lab 08/22/17 1629 08/22/17 2129 08/22/17 2207 08/23/17 0755 08/23/17 1203  GLUCAP 259* 178* 183* 195* 256*       Signed:  Domenic Polite MD.  [facility] 08/23/2017, 12:31 PM

## 2017-08-23 NOTE — Progress Notes (Signed)
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  Pt remains to present with LLE weakness which leads to balance deficits.  Pt remains at risk for falling due to poor safety awareness.  Plan for short term rehab at SNF remains appropriate to improve strength and function before returning home to private residence.      Follow Up Recommendations  SNF;Supervision/Assistance - 24 hour     Equipment Recommendations  None recommended by PT    Recommendations for Other Services       Precautions / Restrictions Precautions Precautions: Fall Precaution Comments: pt with multiple falls and often avoiding self reporting per sister.  Restrictions Weight Bearing Restrictions: No    Mobility  Bed Mobility Overal bed mobility: Needs Assistance Bed Mobility: Supine to Sit     Supine to sit: Min guard     General bed mobility comments: Pt required tactile cues for scooting L hip forward with increased time and effort.  Pt with poor trunk control.    Transfers Overall transfer level: Needs assistance Equipment used: Rolling walker (2 wheeled);None Transfers: Sit to/from Stand Sit to Stand: Min guard         General transfer comment: Cues for hand placement, min guard to steady in standing.    Ambulation/Gait Ambulation/Gait assistance: Min assist Ambulation Distance (Feet): 50 Feet(50 ft with RW, and 12 ft without device.  ) Assistive device: Rolling walker (2 wheeled);None Gait Pattern/deviations: Step-through pattern;Decreased stride length;Ataxic;Drifts right/left;Narrow base of support;Scissoring Gait  velocity: decreased   General Gait Details: Cues for obstacle negotiation and RW safety with AD.  Pt required cues to keep hands in place on RW when moving.  Performed additional short trial without device and patient presents with mild scissoring.     Stairs             Wheelchair Mobility    Modified Rankin (Stroke Patients Only)       Balance Overall balance assessment: Needs assistance   Sitting balance-Leahy Scale: Poor Sitting balance - Comments: If patient leans outside BOS in posterior direction he Looses balance.       Standing balance-Leahy Scale: Poor                              Cognition Arousal/Alertness: Awake/alert Behavior During Therapy: WFL for tasks assessed/performed Overall Cognitive Status: History of cognitive impairments - at baseline                                 General Comments: pt with learning disability per sister. Pt is unable to read or write per sister - uncertain if this is completely due to learning disability or  the visual deficits. pt unable to recognize colors correctly with 4 out 7 during session.       Exercises General Exercises - Lower Extremity Hip Flexion/Marching: AROM;Both;10 reps;Standing(Pt compensating leaning to counter during R hip flexion due to LLE weakness.  ) Heel Raises: AROM;Both;10 reps;Standing Mini-Sqauts: (repeated chair push ups 1x10 reps.  )  General Comments        Pertinent Vitals/Pain Pain Assessment: No/denies pain    Home Living                      Prior Function            PT Goals (current goals can now be found in the care plan section) Acute Rehab PT Goals Patient Stated Goal: per sister - go to rehab Potential to Achieve Goals: Good Progress towards PT goals: Progressing toward goals    Frequency    Min 3X/week      PT Plan Current plan remains appropriate    Co-evaluation              AM-PAC PT "6 Clicks" Daily Activity   Outcome Measure  Difficulty turning over in bed (including adjusting bedclothes, sheets and blankets)?: A Lot Difficulty moving from lying on back to sitting on the side of the bed? : A Lot Difficulty sitting down on and standing up from a chair with arms (e.g., wheelchair, bedside commode, etc,.)?: Unable Help needed moving to and from a bed to chair (including a wheelchair)?: A Little Help needed walking in hospital room?: A Little Help needed climbing 3-5 steps with a railing? : A Little 6 Click Score: 14    End of Session Equipment Utilized During Treatment: Gait belt Activity Tolerance: Patient tolerated treatment well Patient left: in chair;with call bell/phone within reach;with chair alarm set;with family/visitor present Nurse Communication: Mobility status PT Visit Diagnosis: Other abnormalities of gait and mobility (R26.89)     Time: 0301-3143 PT Time Calculation (min) (ACUTE ONLY): 16 min  Charges:  $Therapeutic Activity: 8-22 mins                    G CodesGovernor [name], PTA pager [place] 08/23/2017, 10:04 AM

## 2017-08-24 ENCOUNTER — Non-Acute Institutional Stay (SKILLED_NURSING_FACILITY): Payer: Medicare Other | Admitting: Adult Health

## 2017-08-24 ENCOUNTER — Encounter: Payer: Self-pay | Admitting: Adult Health

## 2017-08-24 DIAGNOSIS — I69354 Hemiplegia and hemiparesis following cerebral infarction affecting left non-dominant side: Secondary | ICD-10-CM

## 2017-08-24 DIAGNOSIS — E1169 Type 2 diabetes mellitus with other specified complication: Secondary | ICD-10-CM

## 2017-08-24 DIAGNOSIS — D638 Anemia in other chronic diseases classified elsewhere: Secondary | ICD-10-CM

## 2017-08-24 DIAGNOSIS — E1129 Type 2 diabetes mellitus with other diabetic kidney complication: Secondary | ICD-10-CM | POA: Diagnosis not present

## 2017-08-24 DIAGNOSIS — N183 Chronic kidney disease, stage 3 unspecified: Secondary | ICD-10-CM

## 2017-08-24 DIAGNOSIS — I10 Essential (primary) hypertension: Secondary | ICD-10-CM | POA: Diagnosis not present

## 2017-08-24 DIAGNOSIS — E785 Hyperlipidemia, unspecified: Secondary | ICD-10-CM

## 2017-08-24 DIAGNOSIS — E1122 Type 2 diabetes mellitus with diabetic chronic kidney disease: Secondary | ICD-10-CM | POA: Diagnosis not present

## 2017-08-24 DIAGNOSIS — I6389 Other cerebral infarction: Secondary | ICD-10-CM

## 2017-08-24 DIAGNOSIS — E1165 Type 2 diabetes mellitus with hyperglycemia: Secondary | ICD-10-CM | POA: Diagnosis not present

## 2017-08-24 DIAGNOSIS — E1159 Type 2 diabetes mellitus with other circulatory complications: Secondary | ICD-10-CM | POA: Diagnosis not present

## 2017-08-24 DIAGNOSIS — K219 Gastro-esophageal reflux disease without esophagitis: Secondary | ICD-10-CM | POA: Diagnosis not present

## 2017-08-24 DIAGNOSIS — Z794 Long term (current) use of insulin: Secondary | ICD-10-CM

## 2017-08-24 DIAGNOSIS — IMO0002 Reserved for concepts with insufficient information to code with codable children: Secondary | ICD-10-CM

## 2017-08-24 NOTE — Progress Notes (Signed)
Location:   [facility] Room Number: 103 A Place of Service:  SNF (31)   CODE STATUS: DNR  Allergies  Allergen Reactions  . Sulfamethoxazole     REACTION: rash    Chief Complaint  Patient presents with  . Hospitalization Follow-up    Hospital follow up    HPI:  He is a 57 year old who has been hospitalized from 08-18-17 through 08-23-17. He had been having frequent falls at home; tingling in his legs. He was diagnosed with subacute CVA involving brain and likely thoracic cord vs. Syrinx at T2. He had been having continence issues. He will need further imaging and follow up with neurology. He is here for short term rehab; hi goal is to return back home. I do not think that this is a possibility for him at this time. He denies any back pain; no change in appetite; no chest pain present. He will continue to be followed for his chronic illnesses including: hypertension; gerd; diabetes.   Past Medical History:  Diagnosis Date  . CEREBROVASCULAR ACCIDENT, HX OF 09/28/2006   2007 affected left side and peripheral vision  . DIABETES MELLITUS, TYPE II 09/28/2006  . GERD (gastroesophageal reflux disease)   . HYPERLIPIDEMIA 09/28/2006  . HYPERTENSION 09/28/2006  . Learning disability   . Stroke [facility]) years ago    Past Surgical History:  Procedure Laterality Date  . COLONOSCOPY    . LYMPHADENECTOMY    . RHINOPLASTY    . SIGMOIDOSCOPY      Social History   Socioeconomic History  . Marital status: Divorced    Spouse name: Not on file  . Number of children: Not on file  . Years of education: Not on file  . Highest education level: Not on file  Occupational History  . Not on file  Social Needs  . Financial resource strain: Not on file  . Food insecurity:    Worry: Not on file    Inability: Not on file  . Transportation needs:    Medical: Not on file    Non-medical: Not on file  Tobacco Use  . Smoking status: Current Every Day Smoker    Packs/day: 1.00   Types: Cigarettes  . Smokeless tobacco: Former Network engineer and Sexual Activity  . Alcohol use: No    Alcohol/week: 0.0 oz  . Drug use: No  . Sexual activity: Not on file  Lifestyle  . Physical activity:    Days per week: Not on file    Minutes per session: Not on file  . Stress: Not on file  Relationships  . Social connections:    Talks on phone: Not on file    Gets together: Not on file    Attends religious service: Not on file    Active member of club or organization: Not on file    Attends meetings of clubs or organizations: Not on file    Relationship status: Not on file  . Intimate partner violence:    Fear of current or ex partner: Not on file    Emotionally abused: Not on file    Physically abused: Not on file    Forced sexual activity: Not on file  Other Topics Concern  . Not on file  Social History Narrative   Single. Lives alone will be living in a trailer near family friends Otho Perl and husband Renee Rival- will be staying in their trailer). They watch out for him.    Sister is  also involved heavily.       Disabled after CVA, low literacy at baseline      Enjoys hunting   Family History  Problem Relation Age of Onset  . Lung cancer Mother   . Diabetes Father   . Stroke Father   . Alcohol abuse Brother   . Diabetes Brother   . Heart attack Sister   . Colon cancer Neg Hx       VITAL SIGNS BP (!) 168/69   Pulse 64   Temp (!) 97 F (36.1 C)   Resp 18   Ht 5\' 9"  (1.753 m)   Wt 169 lb (76.7 kg)   BMI 24.96 kg/m   Outpatient Encounter Medications as of 08/24/2017  Medication Sig  . acetaminophen (TYLENOL) 325 MG tablet Take 2 tablets (650 mg total) by mouth every 6 (six) hours as needed for mild pain (or Fever >/= 101).  [name] amLODipine (NORVASC) 5 MG tablet Take 5 mg by mouth daily.  [name] aspirin 325 MG tablet Take 1 tablet (325 mg total) by mouth daily.  [name] atorvastatin (LIPITOR) 20 MG tablet TAKE 1 TABLET BY MOUTH  DAILY  . buPROPion (WELLBUTRIN  XL) 150 MG 24 hr tablet Take 1 tablet (150 mg total) by mouth daily.  [name] escitalopram (LEXAPRO) 5 MG tablet Take 1 tablet (5 mg total) by mouth daily.  . ferrous sulfate 325 (65 FE) MG tablet Take 1 tablet (325 mg total) by mouth 2 (two) times daily with a meal.  . insulin aspart (NOVOLOG PENFILL) cartridge Inject 5 Units into the skin 3 (three) times daily before meals. For CBG equal to or greater than 150  . Insulin Glargine (LANTUS SOLOSTAR) 100 UNIT/ML Solostar Pen Inject 40 Units into the skin at bedtime.  [name] labetalol (NORMODYNE) 200 MG tablet TAKE 1 TABLET BY MOUTH TWO  TIMES DAILY  . losartan-hydrochlorothiazide (HYZAAR) 100-25 MG tablet Take 1 tablet by mouth daily.  [name] omeprazole (PRILOSEC) 20 MG capsule TAKE 1 CAPSULE BY MOUTH  DAILY   No facility-administered encounter medications on file as of 08/24/2017.      SIGNIFICANT DIAGNOSTIC EXAMS  TODAY:   08-19-17: MRI of thoracic and lumbar spine: 1. Abnormal upper thoracic spinal cord signal affecting a 4.5 cm segment from T2-T3 to T4-T5. Central cord involvement with borderline to mild cord expansion. No enhancement. No other spinal cord signal abnormality, despite multilevel degenerative spinal stenosis elsewhere (see #2). In light of the patient history and Brain MRI findings today a subacute or chronic spinal cord infarct seems most likely. 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 The lack of enhancement argues against an acute inflammatory process of the cord. 2. Superimposed multilevel degenerative spinal stenosis, exacerbated in the thoracic spine by epidural lipomatosis. Predominantly mild degenerative cord mass effect occurs, but at T5-T6 there is moderate to severe stenosis with moderate cord mass effect, but no cord signal abnormality at that level. 3. Moderate  degenerative lumbar spinal and lateral recess stenosis at L4-L5. 4.  No acute osseous abnormality identified in the spine.  08-19-17: mri of brain: 1. See abnormal Thoracic Spine MRI findings today reported separately. 2. Very advanced chronic small vessel disease affecting the deep white matter, deep gray matter, and brainstem with two superimposed subacute appearing lacunar infarcts. No hemorrhage or mass effect.  08-19-17: carotid doppler: Right Carotid: Velocities  in the right ICA are consistent with a 1-39% stenosis. Left Carotid: Velocities in the left ICA are consistent with a 1-39% stenosis. Vertebrals:  Bilateral vertebral arteries demonstrate antegrade flow. Subclavians: Normal flow hemodynamics were seen in bilateral subclavian arteries.   08-20-17: 2- echo: - Left ventricle: Septal apical and inferior wall hypokinesis regionality suggests CAD. The cavity size was severely dilated. Wall thickness was increased in a pattern of severe LVH. Systolic function was moderately to severely reduced. The estimated ejection fraction was in the range of 30% to 35%. Doppler parameters are consistent with both elevated ventricular end-diastolic filling pressure and elevated left atrial filling pressure. - Atrial septum: No defect or patent foramen ovale was identified.   LABS REVIEWED TODAY:   08-18-17: wbc 13.1; hgb 15.3;hct43.8; mcv 92.8;  plt 238; glucose 86; bun 30; creat 1.71; k+ 3.9; na++ 140; ca 9.4 08-19-17: wbc 11.8; hgb 13.4; hct 39.3; mcv 92.9 plt 196; glucose 72; bun 32; creat 1.86; k+ 3.5; na++ 141; ca 8.7; tsh 4.327; vit B 12: 517; folate 15.0; hgb a1c 9.2; copper 112; HIV/RPR: nr 08-20-17: chol 120; ldl 60 trig 181; hdl 24 08-23-17: glucose 167; bun 18; creat 1.56; k+ 3.9; na++ 138; ca 8.8     Review of Systems  Constitutional: Negative for malaise/fatigue.  Respiratory: Negative for cough and shortness of breath.   Cardiovascular: Negative for chest pain, palpitations and leg swelling.    Gastrointestinal: Negative for abdominal pain, constipation and heartburn.  Musculoskeletal: Negative for back pain, joint pain and myalgias.  Skin: Negative.   Neurological: Negative for dizziness.  Psychiatric/Behavioral: The patient is not nervous/anxious.     Physical Exam  Constitutional: He is oriented to person, place, and time. He appears well-developed and well-nourished. No distress.  HENT:  Mouth/Throat: Oropharynx is clear and moist.  Eyes: Conjunctivae are normal.  Neck: No thyromegaly present.  Cardiovascular: Normal rate, regular rhythm and intact distal pulses.  Murmur heard. 1/6  Pulmonary/Chest: Effort normal and breath sounds normal. No respiratory distress.  Abdominal: Soft. Bowel sounds are normal. He exhibits no distension. There is no tenderness.  Musculoskeletal: He exhibits no edema.  Lymphadenopathy:    He has no cervical adenopathy.  Neurological: He is alert and oriented to person, place, and time.  Has left sided weakness left hemiparesis Is able to move all extremities   Skin: Skin is warm and dry. He is not diaphoretic.  Psychiatric: He has a normal mood and affect.      ASSESSMENT/ PLAN:  TODAY:   1. Hypertension associated with diabetes: is worse; will continue labetolol 200 mg twice daily hyzaar 100/25 mg daily and will increase his norvasc to 10 mg daily   2.  CVA has hemiparesis affecting left side as late effect of cerebrovascular accident: is stable will continue asa 325 mg daily   3.  Dyslipidemia associated with type 2 diabetes: stable ldl 60 will continue lipitor 20 mg daily   4.  Type 2 diabetes mellitus uncontrolled with renal manifestations: without change; hgb a1c 9.0; will continue lantus 40 units daily and will continue novolog 5 units with meals  Is no arb; statin; asa  5. Stage 3 chronic renal impairment associated with type 2 diabetes mellitus: stable bun 18; creat 1.56; will monitor  6. Anemia of chronic disease: stable  hgb 13.4; will continue iron twice daily   7. Depression major; single episode mild: is stable will continue lexapro 5 mg daily and wellbutrin xl 150 mg daily  8. Jerrye Bushy without esophagitis: stable will continue prilosec 20 mg daily   Will continue therapy as directed to improve his level of independence with his adls.   He will need to see Dr. Saintclair Halsted in one month   MD is aware of resident's narcotic use and is in agreement with current plan of care. We will attempt to wean resident as apropriate   Ok Edwards NP [facility]  Contact [phone] Monday through Friday 8am- 5pm  After hours call [phone]

## 2017-08-26 ENCOUNTER — Encounter: Payer: Self-pay | Admitting: Internal Medicine

## 2017-08-26 ENCOUNTER — Non-Acute Institutional Stay (SKILLED_NURSING_FACILITY): Payer: Medicare Other | Admitting: Internal Medicine

## 2017-08-26 DIAGNOSIS — R9089 Other abnormal findings on diagnostic imaging of central nervous system: Secondary | ICD-10-CM

## 2017-08-26 DIAGNOSIS — I69354 Hemiplegia and hemiparesis following cerebral infarction affecting left non-dominant side: Secondary | ICD-10-CM

## 2017-08-26 DIAGNOSIS — Z794 Long term (current) use of insulin: Secondary | ICD-10-CM

## 2017-08-26 DIAGNOSIS — E1165 Type 2 diabetes mellitus with hyperglycemia: Secondary | ICD-10-CM | POA: Diagnosis not present

## 2017-08-26 DIAGNOSIS — I10 Essential (primary) hypertension: Secondary | ICD-10-CM

## 2017-08-26 DIAGNOSIS — N183 Chronic kidney disease, stage 3 unspecified: Secondary | ICD-10-CM

## 2017-08-26 DIAGNOSIS — R296 Repeated falls: Secondary | ICD-10-CM

## 2017-08-26 DIAGNOSIS — E782 Mixed hyperlipidemia: Secondary | ICD-10-CM

## 2017-08-26 DIAGNOSIS — I679 Cerebrovascular disease, unspecified: Secondary | ICD-10-CM

## 2017-08-26 NOTE — Progress Notes (Signed)
Patient ID: William [name], male   DOB: April 21, 1960, 57 y.o.   MRN: [id number]  Provider:  DR Arletha Grippe Location:  [place] Room Number: [place] of Service:  SNF (31)  PCP: Marin Olp, MD Patient Care Team: Marin Olp, MD as PCP - General (Family Medicine)  Extended Emergency Contact Information Primary Emergency Contact: Smith,Goldie Address: [address], [place] Phone: [phone] Work Phone: [phone] Mobile Phone: [phone] Relation: Sister Secondary Emergency Contact: Smith,Rachel  [place] Phone: [phone] Relation: Friend  Code Status: DNR Goals of Care: Advanced Directive information Advanced Directives 08/26/2017  Does Patient Have a Medical Advance Directive? Yes  Type of Advance Directive Out of facility DNR (pink MOST or yellow form)  Does patient want to make changes to medical advance directive? No - Patient declined  Would patient like information on creating a medical advance directive? -  Pre-existing out of facility DNR order (yellow form or pink MOST form) Yellow form placed in chart (order not valid for inpatient use)      Chief Complaint  Patient presents with  . New Admit To SNF    Admission    HPI: Patient is a 57 y.o. male seen today for admission to SNF following hospital stay for fall, gait d/o, T2 spinal cord abnormality, subacute CVA, and DM/hyperlipidemia/HTN/GERD/tobacco abuse/CKD/depression/urinary incontinence. MRI brain revealed advanced chronic SVD and subacute lacunar infarct. MRI T spine abnormal central cord signal at T2-T3 s/o subacute infarct. MRI T-L spine also showed spnal stenosis and DDD. He presents to SNF for short temr rehab.  Today he reports c/a frequent falls. He fell this AM while attempting to go to the bathroom. Fall unwitnessed but he denies injury. No loss of bowel/bladder control. He is a poor  historian due to cognitive impairment. Hx obtained from chart. Prior to hospital admission, he lived alone. He has chronic left hemiparesis since CVA. CBGs 200-300s. No low BS reactions.    Past Medical History:  Diagnosis Date  . CEREBROVASCULAR ACCIDENT, HX OF 09/28/2006   2007 affected left side and peripheral vision  . DIABETES MELLITUS, TYPE II 09/28/2006  . GERD (gastroesophageal reflux disease)   . HYPERLIPIDEMIA 09/28/2006  . HYPERTENSION 09/28/2006  . Learning disability   . Stroke [facility]) years ago   Past Surgical History:  Procedure Laterality Date  . COLONOSCOPY    . LYMPHADENECTOMY    . RHINOPLASTY    . SIGMOIDOSCOPY      reports that he has been smoking cigarettes.  He has been smoking about 1.00 pack per day. He has quit using smokeless tobacco. He reports that he does not drink alcohol or use drugs. Social History   Socioeconomic History  . Marital status: Divorced    Spouse name: Not on file  . Number of children: Not on file  . Years of education: Not on file  . Highest education level: Not on file  Occupational History  . Not on file  Social Needs  . Financial resource strain: Not on file  . Food insecurity:    Worry: Not on file    Inability: Not on file  . Transportation needs:    Medical: Not on file    Non-medical: Not on file  Tobacco Use  . Smoking status: Current Every Day Smoker    Packs/day: 1.00    Types: Cigarettes  . Smokeless  tobacco: Former Network engineer and Sexual Activity  . Alcohol use: No    Alcohol/week: 0.0 oz  . Drug use: No  . Sexual activity: Not on file  Lifestyle  . Physical activity:    Days per week: Not on file    Minutes per session: Not on file  . Stress: Not on file  Relationships  . Social connections:    Talks on phone: Not on file    Gets together: Not on file    Attends religious service: Not on file    Active member of club or organization: Not on file    Attends meetings of clubs or organizations: Not on  file    Relationship status: Not on file  . Intimate partner violence:    Fear of current or ex partner: Not on file    Emotionally abused: Not on file    Physically abused: Not on file    Forced sexual activity: Not on file  Other Topics Concern  . Not on file  Social History Narrative   Single. Lives alone will be living in a trailer near family friends Otho Perl and husband Renee Rival- will be staying in their trailer). They watch out for him.    Sister is also involved heavily.       Disabled after CVA, low literacy at baseline      Enjoys hunting    Functional Status Survey:    Family History  Problem Relation Age of Onset  . Lung cancer Mother   . Diabetes Father   . Stroke Father   . Alcohol abuse Brother   . Diabetes Brother   . Heart attack Sister   . Colon cancer Neg Hx     Health Maintenance  Topic Date Due  . FOOT EXAM  08/25/2018 (Originally 06/09/2017)  . OPHTHALMOLOGY EXAM  08/25/2018 (Originally 07/13/2017)  . PNEUMOCOCCAL POLYSACCHARIDE VACCINE (2) 10/04/2023 (Originally 12/25/2015)  . INFLUENZA VACCINE  11/18/2017  . HEMOGLOBIN A1C  02/19/2018  . TETANUS/TDAP  12/24/2020  . COLONOSCOPY  09/02/2025  . Hepatitis C Screening  Completed  . HIV Screening  Completed    Allergies  Allergen Reactions  . Sulfamethoxazole     REACTION: rash    Outpatient Encounter Medications as of 08/26/2017  Medication Sig  . acetaminophen (TYLENOL) 325 MG tablet Take 2 tablets (650 mg total) by mouth every 6 (six) hours as needed for mild pain (or Fever >/= 101).  [name] amLODipine (NORVASC) 10 MG tablet Take 10 mg by mouth daily.   [name] aspirin 325 MG tablet Take 1 tablet (325 mg total) by mouth daily.  [name] atorvastatin (LIPITOR) 20 MG tablet TAKE 1 TABLET BY MOUTH  DAILY  . buPROPion (WELLBUTRIN XL) 150 MG 24 hr tablet Take 1 tablet (150 mg total) by mouth daily.  [name] escitalopram (LEXAPRO) 5 MG tablet Take 1 tablet (5 mg total) by mouth daily.  . ferrous sulfate 325 (65 FE)  MG tablet Take 1 tablet (325 mg total) by mouth 2 (two) times daily with a meal.  . insulin aspart (NOVOLOG PENFILL) cartridge Inject 5 Units into the skin 3 (three) times daily before meals. For CBG equal to or greater than 150  . Insulin Glargine (LANTUS SOLOSTAR) 100 UNIT/ML Solostar Pen Inject 40 Units into the skin at bedtime.  [name] labetalol (NORMODYNE) 200 MG tablet TAKE 1 TABLET BY MOUTH TWO  TIMES DAILY  . losartan-hydrochlorothiazide (HYZAAR) 100-25 MG tablet Take 1 tablet by mouth daily.  [name]  omeprazole (PRILOSEC) 20 MG capsule TAKE 1 CAPSULE BY MOUTH  DAILY   No facility-administered encounter medications on file as of 08/26/2017.     Review of Systems  Unable to perform ROS: Other (cognitive impairment)    Vitals:   08/26/17 0936  Pulse: 64  Resp: 18  Temp: (!) 97 F (36.1 C)  Weight: 169 lb (76.7 kg)  Height: 5\' 9"  (1.753 m)   Body mass index is 24.96 kg/m. Physical Exam  Constitutional: He appears well-developed and well-nourished.  Sitting in w/c in NAD, looks well  HENT:  Mouth/Throat: Oropharynx is clear and moist.  MMM; no oral thrush  Eyes: Pupils are equal, round, and reactive to light. No scleral icterus.  Neck: Neck supple. Carotid bruit is not present. No thyromegaly present.  Cardiovascular: Normal rate, regular rhythm and intact distal pulses. Exam reveals no gallop and no friction rub.  Murmur (1/6 SEM) heard. no distal LE swelling. No calf TTP  Pulmonary/Chest: Effort normal and breath sounds normal. He has no wheezes. He has no rales. He exhibits no tenderness.  Inspiratory congested BS that resolves with cough; prolonged expiratory phase  Abdominal: Soft. Bowel sounds are normal. He exhibits no distension, no abdominal bruit, no pulsatile midline mass and no mass. There is no hepatomegaly. There is no tenderness. There is no rebound and no guarding.  Musculoskeletal: He exhibits edema (small and large joints) and tenderness (b/l paravertebral lumbar  spine).  Lymphadenopathy:    He has no cervical adenopathy.  Neurological: He is alert. He has normal reflexes.  Left hemiparesis  Skin: Skin is warm and dry. No rash noted.  Psychiatric: He has a normal mood and affect. His behavior is normal. Thought content normal.    Labs reviewed: Basic Metabolic Panel: Recent Labs    08/18/17 1917 08/19/17 0319 08/23/17 0407  NA 140 141 138  K 3.9 3.5 3.9  CL 102 104 99*  CO2 28 27 29   GLUCOSE 86 72 167*  BUN 30* 32* 18  CREATININE 1.71* 1.86* 1.56*  CALCIUM 9.4 8.7* 8.8*   Liver Function Tests: Recent Labs    11/03/16 1045 05/25/17 1612  AST 11 10  ALT 11 6  ALKPHOS 128* 106  BILITOT 0.4 0.4  PROT 6.7 6.9  ALBUMIN 3.7 3.8   No results for input(s): LIPASE, AMYLASE in the last 8760 hours. No results for input(s): AMMONIA in the last 8760 hours. CBC: Recent Labs    05/25/17 1612 08/18/17 1917 08/19/17 0319  WBC 11.0* 13.1* 11.8*  HGB 14.1 15.3 13.4  HCT 41.7 43.8 39.3  MCV 93.5 92.8 92.9  PLT 288.0 238 196   Cardiac Enzymes: Recent Labs    08/18/17 0126  CKTOTAL 63   BNP: Invalid input(s): POCBNP Lab Results  Component Value Date   HGBA1C 9.2 (H) 08/19/2017   Lab Results  Component Value Date   TSH 4.327 08/19/2017   Lab Results  Component Value Date   VITAMINB12 517 08/19/2017   Lab Results  Component Value Date   FOLATE 15.0 08/19/2017   Lab Results  Component Value Date   IRON 46 (L) 05/25/2017   TIBC 199 (L) 05/25/2017   FERRITIN 60 05/25/2017    Imaging and Procedures obtained prior to SNF admission: Mr Brain Wo Contrast  Result Date: 08/19/2017 CLINICAL DATA:  57 year old male with learning disability, whose family noted difficulty walking over the past year. Ataxia, falls. Generalized weakness. Urinary incontinence. Memory problems. Diabetes, hypertension, stage 3 chronic kidney disease.  EXAM: MRI HEAD WITHOUT CONTRAST TECHNIQUE: Multiplanar, multiecho pulse sequences of the brain and  surrounding structures were obtained without intravenous contrast. COMPARISON:  Brain MRI 07/25/2017, 11/10/2004. Thoracic and lumbar MRI today reported separately. FINDINGS: Brain: Widespread chronic abnormal T2 and FLAIR hyperintensity indicative of extensive chronic lacunar infarcts in the brainstem (primarily the pons), bilateral deep gray matter nuclei, posterior limb left internal capsule, bilateral corona radiata, and occasionally also the cerebellum. There is Wallerian degeneration suspected at the right midbrain and brainstem. Some chronic hemosiderin is noted in the right corona radiata. Diffusion-weighted imaging today suggests a small linear area of restricted diffusion in the right corona radiata bordering the prior small vessel infarcts (series 7001, image 52). Furthermore, there is a punctate area of trace diffusion signal in the ventral left thalamus (series 5001, image 65) which appears isointense on ADC. Diffusion abnormality was present at both of these sites last month. No other restricted diffusion. No midline shift, mass effect, evidence of mass lesion, ventriculomegaly, extra-axial collection or acute intracranial hemorrhage. Cervicomedullary junction and pituitary are within normal limits. Vascular: Major intracranial vascular flow voids are stable since last month. The distal left vertebral artery appears chronically occluded. Skull and upper cervical spine: The visible cervical spine is remarkable for mild degenerative changes perhaps superimposed on some congenital canal narrowing. There is borderline to mild C3 and C4 level spinal stenosis which appears stable. See also the thoracic spine report today. Bone marrow signal is normal. Sinuses/Orbits: Continued subtotal opacification of the left maxillary sinus with some fluid. Mild ethmoid sinus mucosal thickening. Stable and negative orbits soft tissues. Other: Mastoid air cells remain clear. Visible internal auditory structures appear  normal. IMPRESSION: 1. See abnormal Thoracic Spine MRI findings today reported separately. 2. Very advanced chronic small vessel disease affecting the deep white matter, deep gray matter, and brainstem with two superimposed subacute appearing lacunar infarcts. No hemorrhage or mass effect. Electronically Signed   By: Genevie Ann M.D.   On: 08/19/2017 08:28   Mr Lumbar Spine Wo Contrast  Addendum Date: 08/19/2017   ADDENDUM REPORT: 08/19/2017 12:58 ADDENDUM: Study discussed in person with Dr. Roland Rack on 08/19/2017 at 1245 hours. Electronically Signed   By: Genevie Ann M.D.   On: 08/19/2017 12:58   Addendum Date: 08/19/2017   ADDENDUM REPORT: 08/19/2017 09:23 ADDENDUM: Study discussed by telephone with Hospitalist Dr. Broadus John on 08/19/2017 at 0857 hours. Electronically Signed   By: Genevie Ann M.D.   On: 08/19/2017 09:23   Result Date: 08/19/2017 CLINICAL DATA:  57 year old male with learning disability, whose family has noted difficulty walking over the past year. Ataxia, falls. Generalized weakness. Urinary incontinence. Memory problems. Diabetes, hypertension, stage 3 chronic kidney disease. EXAM: MRI THORACIC SPINE WITHOUT AND WITH CONTRAST MRI LUMBAR SPINE WITHOUT CONTRAST. TECHNIQUE: Multiplanar and multiecho pulse sequences of the thoracic and lumbar spine were obtained. The thoracic spine was imaged without and with intravenous contrast. CONTRAST:  66mL MULTIHANCE GADOBENATE DIMEGLUMINE 529 MG/ML IV SOLN COMPARISON:  Brain MRI today and also 07/25/2017. Chest radiographs 05/25/2017, 09/19/2010. FINDINGS: MRI THORACIC SPINE FINDINGS Limited cervical spine imaging: Sagittal T1 and T2 weighted imaging was performed of the cervical spine in an attempt to quantify a degree of degenerative cervical spinal stenosis evident on the scout view. The sagittal T2 weighted imaging is degraded by motion. However, broad-based posterior disc protrusions are evident at C5-C6 and C6-C7 and result in at least mild cervical spinal  stenosis with probably mild associated spinal cord mass effect. There is  no definite abnormal signal in the cervical spinal cord. Thoracic spine segmentation:  Appears normal. Alignment: Thoracic vertebral height and alignment appears stable since 2012. Mildly exaggerated thoracic kyphosis. Vertebrae: Visualized bone marrow signal is within normal limits. No marrow edema or evidence of acute osseous abnormality. Cord: Confluent abnormal upper thoracic spinal cord signal consisting of nearly holo cord T2 and STIR hyperintensity from the T2-T3 to the T4-T5 levels. See series 19001 and series 21001, images 8 and 9. on axial images through this region there does appear to be a rim of preserved spinal cord white matter. The cord does appear mildly expanded in the affected area. There are fairly discrete margins to normal cord signal above the T2-T3 disc and below the mid T5 vertebra. There is no other abnormal thoracic spinal cord signal, despite degenerative spinal stenosis with cord mass effect at T5-T6 (see below). The conus medullaris is visible on the lumbar images. Following contrast there is no abnormal spinal cord or intradural enhancement. No dural thickening. Paraspinal and other soft tissues: Negative. Disc levels: Notable thoracic spine degeneration as follows: T2-T3: Moderate sized right paracentral to subarticular disc protrusion with endplate spurring. Mild spinal stenosis with borderline to mild cord mass effect. Facet hypertrophy. Mild left T3 foraminal stenosis. T3 and T4 levels: Moderate facet and ligament flavum hypertrophy, but no spinal stenosis. T5-T6: Right paracentral cephalad disc extrusion (series 19001, image 8) superimposed on rightward disc bulging, endplate spurring, moderate to severe ligament flavum hypertrophy, mild facet hypertrophy, and epidural lipomatosis. There is moderate to severe spinal stenosis and moderate cord mass effect (series 23001, image 16). From T5 inferiorly there is  thoracic epidural lipomatosis which contributes to effaced CSF from the thecal sac. T8-T9: Mild left paracentral disc bulge or protrusion. Mild spinal stenosis with minimal left hemi cord mass effect. T9-T10: More broad-based posterior disc bulging and endplate spurring with epidural lipomatosis and moderate posterior element hypertrophy. Mild spinal stenosis with mild ventral cord mass effect. MRI LUMBAR SPINE FINDINGS Segmentation: Normal, concordant with the thoracic spine numbering. There is a vestigial S1-S2 disc space, but the S1 level is otherwise fully sacralized. Alignment:  Normal lumbar lordosis. Vertebrae: Visualized bone marrow signal is within normal limits. No marrow edema or evidence of acute osseous abnormality. Intact visible sacrum and SI joints. Conus medullaris: Extends to the L2 level and appears normal. No lower spinal cord or conus signal abnormality. Paraspinal and other soft tissues: There is a degree of bilateral renal atrophy. Small T2 hyperintense probable benign right renal cysts are noted. Negative visible other abdominal viscera. Negative visualized posterior paraspinal soft tissues. Disc levels: Mild lumbar spine degeneration overall. However, degenerative stenosis as follows: L4-L5: Circumferential disc bulge with broad-based posterior and biforaminal involvement. Moderate ligament flavum and moderate to severe facet hypertrophy. Tiny subligamentous synovial cysts not contributing to stenosis at this time (series 27001, image 24). Mild to moderate spinal and bilateral lateral recess stenosis (descending L5 nerve levels). Borderline to mild bilateral L4 neural foraminal stenosis, greater on the right. IMPRESSION: 1. Abnormal upper thoracic spinal cord signal affecting a 4.5 cm segment from T2-T3 to T4-T5. Central cord involvement with borderline to mild cord expansion. No enhancement. No other spinal cord signal abnormality, despite multilevel degenerative spinal stenosis elsewhere  (see #2). In light of the patient history and Brain MRI findings today a subacute or chronic spinal cord infarct seems most likely. Myelomalacia due to compressive myelopathy was considered in light of the multilevel cervical and thoracic spinal stenosis, but the cord  signal abnormality is remote from the areas of stenosis with mass effect. The lack of enhancement argues against an acute inflammatory process of the cord. 2. Superimposed multilevel degenerative spinal stenosis, exacerbated in the thoracic spine by epidural lipomatosis. Predominantly mild degenerative cord mass effect occurs, but at T5-T6 there is moderate to severe stenosis with moderate cord mass effect, but no cord signal abnormality at that level. 3. Moderate degenerative lumbar spinal and lateral recess stenosis at L4-L5. 4.  No acute osseous abnormality identified in the spine. Electronically Signed: By: Genevie Ann M.D. On: 08/19/2017 08:48   Mr Thoracic Spine W Wo Contrast  Addendum Date: 08/19/2017   ADDENDUM REPORT: 08/19/2017 12:58 ADDENDUM: Study discussed in person with Dr. Roland Rack on 08/19/2017 at 1245 hours. Electronically Signed   By: Genevie Ann M.D.   On: 08/19/2017 12:58   Addendum Date: 08/19/2017   ADDENDUM REPORT: 08/19/2017 09:23 ADDENDUM: Study discussed by telephone with Hospitalist Dr. Broadus John on 08/19/2017 at 0857 hours. Electronically Signed   By: Genevie Ann M.D.   On: 08/19/2017 09:23   Result Date: 08/19/2017 CLINICAL DATA:  57 year old male with learning disability, whose family has noted difficulty walking over the past year. Ataxia, falls. Generalized weakness. Urinary incontinence. Memory problems. Diabetes, hypertension, stage 3 chronic kidney disease. EXAM: MRI THORACIC SPINE WITHOUT AND WITH CONTRAST MRI LUMBAR SPINE WITHOUT CONTRAST. TECHNIQUE: Multiplanar and multiecho pulse sequences of the thoracic and lumbar spine were obtained. The thoracic spine was imaged without and with intravenous contrast. CONTRAST:   24mL MULTIHANCE GADOBENATE DIMEGLUMINE 529 MG/ML IV SOLN COMPARISON:  Brain MRI today and also 07/25/2017. Chest radiographs 05/25/2017, 09/19/2010. FINDINGS: MRI THORACIC SPINE FINDINGS Limited cervical spine imaging: Sagittal T1 and T2 weighted imaging was performed of the cervical spine in an attempt to quantify a degree of degenerative cervical spinal stenosis evident on the scout view. The sagittal T2 weighted imaging is degraded by motion. However, broad-based posterior disc protrusions are evident at C5-C6 and C6-C7 and result in at least mild cervical spinal stenosis with probably mild associated spinal cord mass effect. There is no definite abnormal signal in the cervical spinal cord. Thoracic spine segmentation:  Appears normal. Alignment: Thoracic vertebral height and alignment appears stable since 2012. Mildly exaggerated thoracic kyphosis. Vertebrae: Visualized bone marrow signal is within normal limits. No marrow edema or evidence of acute osseous abnormality. Cord: Confluent abnormal upper thoracic spinal cord signal consisting of nearly holo cord T2 and STIR hyperintensity from the T2-T3 to the T4-T5 levels. See series 19001 and series 21001, images 8 and 9. on axial images through this region there does appear to be a rim of preserved spinal cord white matter. The cord does appear mildly expanded in the affected area. There are fairly discrete margins to normal cord signal above the T2-T3 disc and below the mid T5 vertebra. There is no other abnormal thoracic spinal cord signal, despite degenerative spinal stenosis with cord mass effect at T5-T6 (see below). The conus medullaris is visible on the lumbar images. Following contrast there is no abnormal spinal cord or intradural enhancement. No dural thickening. Paraspinal and other soft tissues: Negative. Disc levels: Notable thoracic spine degeneration as follows: T2-T3: Moderate sized right paracentral to subarticular disc protrusion with endplate  spurring. Mild spinal stenosis with borderline to mild cord mass effect. Facet hypertrophy. Mild left T3 foraminal stenosis. T3 and T4 levels: Moderate facet and ligament flavum hypertrophy, but no spinal stenosis. T5-T6: Right paracentral cephalad disc extrusion (series 19001, image 8)  superimposed on rightward disc bulging, endplate spurring, moderate to severe ligament flavum hypertrophy, mild facet hypertrophy, and epidural lipomatosis. There is moderate to severe spinal stenosis and moderate cord mass effect (series 23001, image 16). From T5 inferiorly there is thoracic epidural lipomatosis which contributes to effaced CSF from the thecal sac. T8-T9: Mild left paracentral disc bulge or protrusion. Mild spinal stenosis with minimal left hemi cord mass effect. T9-T10: More broad-based posterior disc bulging and endplate spurring with epidural lipomatosis and moderate posterior element hypertrophy. Mild spinal stenosis with mild ventral cord mass effect. MRI LUMBAR SPINE FINDINGS Segmentation: Normal, concordant with the thoracic spine numbering. There is a vestigial S1-S2 disc space, but the S1 level is otherwise fully sacralized. Alignment:  Normal lumbar lordosis. Vertebrae: Visualized bone marrow signal is within normal limits. No marrow edema or evidence of acute osseous abnormality. Intact visible sacrum and SI joints. Conus medullaris: Extends to the L2 level and appears normal. No lower spinal cord or conus signal abnormality. Paraspinal and other soft tissues: There is a degree of bilateral renal atrophy. Small T2 hyperintense probable benign right renal cysts are noted. Negative visible other abdominal viscera. Negative visualized posterior paraspinal soft tissues. Disc levels: Mild lumbar spine degeneration overall. However, degenerative stenosis as follows: L4-L5: Circumferential disc bulge with broad-based posterior and biforaminal involvement. Moderate ligament flavum and moderate to severe facet  hypertrophy. Tiny subligamentous synovial cysts not contributing to stenosis at this time (series 27001, image 24). Mild to moderate spinal and bilateral lateral recess stenosis (descending L5 nerve levels). Borderline to mild bilateral L4 neural foraminal stenosis, greater on the right. IMPRESSION: 1. Abnormal upper thoracic spinal cord signal affecting a 4.5 cm segment from T2-T3 to T4-T5. Central cord involvement with borderline to mild cord expansion. No enhancement. No other spinal cord signal abnormality, despite multilevel degenerative spinal stenosis elsewhere (see #2). In light of the patient history and Brain MRI findings today a subacute or chronic spinal cord infarct seems most likely. 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 The lack of enhancement argues against an acute inflammatory process of the cord. 2. Superimposed multilevel degenerative spinal stenosis, exacerbated in the thoracic spine by epidural lipomatosis. Predominantly mild degenerative cord mass effect occurs, but at T5-T6 there is moderate to severe stenosis with moderate cord mass effect, but no cord signal abnormality at that level. 3. Moderate degenerative lumbar spinal and lateral recess stenosis at L4-L5. 4.  No acute osseous abnormality identified in the spine. Electronically Signed: By: Genevie Ann M.D. On: 08/19/2017 08:48    Assessment/Plan   ICD-10-CM   1. Frequent falls R29.6   2. Type 2 diabetes mellitus with hyperglycemia, with long-term current use of insulin (HCC) E11.65    Z79.4   3. Hemiparesis affecting left side as late effect of cerebrovascular accident (CVA) ([place]) I69.354   4. Essential hypertension I10   5. CKD (chronic kidney disease), stage III (HCC) N18.3   6. Mixed hyperlipidemia E78.2   7. Cerebrovascular disease I67.9   8. Abnormal  MRI, spinal cord R90.89      F/u with neurosx Dr Saintclair Halsted in 1 month to reck MRI T spine. Per hospital record, he is a poor surgical candidate.  INCREASE LANTUS 46 UNITS QHS   Cont other meds as ordered  Initiate facility fall prevention program - he is at increased risk of falls  OT/ST as ordered  PT eval and tx due to frequent falls and left hemiparesis 2/2 CVA  GOAL: short term rehab and d/c home when medically appropriate. Communicated with pt and nursing.  Will follow  Labs/tests ordered: none    Ereka Brau S. Perlie Gold  [facility] [address] [place], [place] [address] [phone] Cell (Monday-Friday 8 AM - 5 PM) [phone] After 5 PM and follow prompts

## 2017-08-31 ENCOUNTER — Non-Acute Institutional Stay (SKILLED_NURSING_FACILITY): Payer: Medicare Other | Admitting: Adult Health

## 2017-08-31 ENCOUNTER — Encounter: Payer: Self-pay | Admitting: Adult Health

## 2017-08-31 DIAGNOSIS — E1122 Type 2 diabetes mellitus with diabetic chronic kidney disease: Secondary | ICD-10-CM | POA: Diagnosis not present

## 2017-08-31 DIAGNOSIS — N183 Chronic kidney disease, stage 3 unspecified: Secondary | ICD-10-CM | POA: Insufficient documentation

## 2017-08-31 DIAGNOSIS — D638 Anemia in other chronic diseases classified elsewhere: Secondary | ICD-10-CM | POA: Insufficient documentation

## 2017-08-31 DIAGNOSIS — Z794 Long term (current) use of insulin: Secondary | ICD-10-CM

## 2017-08-31 DIAGNOSIS — I152 Hypertension secondary to endocrine disorders: Secondary | ICD-10-CM | POA: Insufficient documentation

## 2017-08-31 DIAGNOSIS — I10 Essential (primary) hypertension: Secondary | ICD-10-CM

## 2017-08-31 DIAGNOSIS — E1129 Type 2 diabetes mellitus with other diabetic kidney complication: Secondary | ICD-10-CM | POA: Insufficient documentation

## 2017-08-31 DIAGNOSIS — E785 Hyperlipidemia, unspecified: Secondary | ICD-10-CM

## 2017-08-31 DIAGNOSIS — E1165 Type 2 diabetes mellitus with hyperglycemia: Secondary | ICD-10-CM

## 2017-08-31 DIAGNOSIS — E1169 Type 2 diabetes mellitus with other specified complication: Secondary | ICD-10-CM | POA: Insufficient documentation

## 2017-08-31 DIAGNOSIS — E1159 Type 2 diabetes mellitus with other circulatory complications: Secondary | ICD-10-CM | POA: Diagnosis not present

## 2017-08-31 DIAGNOSIS — IMO0002 Reserved for concepts with insufficient information to code with codable children: Secondary | ICD-10-CM

## 2017-08-31 DIAGNOSIS — I69354 Hemiplegia and hemiparesis following cerebral infarction affecting left non-dominant side: Secondary | ICD-10-CM | POA: Insufficient documentation

## 2017-08-31 NOTE — Progress Notes (Signed)
Location:   [facility] Room Number: 103 A Place of Service:  SNF (31)   CODE STATUS: DNR  Allergies  Allergen Reactions  . Sulfamethoxazole     REACTION: rash    Chief Complaint  Patient presents with  . Medical Management of Chronic Issues    Hypertension; ckd stage 3; diabetes; anemia; weekly follow up for the first 30 days post hospitalization     HPI:  He is a 57 year old long term patient of this facility being seen for the management of his chronic illnesses: hypertension; stage 3 renal disease; diabetes; anemia. He denies any uncontrolled pain; no excessive thirst or hunger; no headaches. His cbg readings are all elevated above 200. There are no nursing concerns at this time.   Past Medical History:  Diagnosis Date  . CEREBROVASCULAR ACCIDENT, HX OF 09/28/2006   2007 affected left side and peripheral vision  . DIABETES MELLITUS, TYPE II 09/28/2006  . GERD (gastroesophageal reflux disease)   . HYPERLIPIDEMIA 09/28/2006  . HYPERTENSION 09/28/2006  . Learning disability   . Stroke [facility]) years ago    Past Surgical History:  Procedure Laterality Date  . COLONOSCOPY    . LYMPHADENECTOMY    . RHINOPLASTY    . SIGMOIDOSCOPY      Social History   Socioeconomic History  . Marital status: Divorced    Spouse name: Not on file  . Number of children: Not on file  . Years of education: Not on file  . Highest education level: Not on file  Occupational History  . Not on file  Social Needs  . Financial resource strain: Not on file  . Food insecurity:    Worry: Not on file    Inability: Not on file  . Transportation needs:    Medical: Not on file    Non-medical: Not on file  Tobacco Use  . Smoking status: Current Every Day Smoker    Packs/day: 1.00    Types: Cigarettes  . Smokeless tobacco: Former Network engineer and Sexual Activity  . Alcohol use: No    Alcohol/week: 0.0 oz  . Drug use: No  . Sexual activity: Not on file  Lifestyle  .  Physical activity:    Days per week: Not on file    Minutes per session: Not on file  . Stress: Not on file  Relationships  . Social connections:    Talks on phone: Not on file    Gets together: Not on file    Attends religious service: Not on file    Active member of club or organization: Not on file    Attends meetings of clubs or organizations: Not on file    Relationship status: Not on file  . Intimate partner violence:    Fear of current or ex partner: Not on file    Emotionally abused: Not on file    Physically abused: Not on file    Forced sexual activity: Not on file  Other Topics Concern  . Not on file  Social History Narrative   Single. Lives alone will be living in a trailer near family friends Otho Perl and husband Renee Rival- will be staying in their trailer). They watch out for him.    Sister is also involved heavily.       Disabled after CVA, low literacy at baseline      Enjoys hunting   Family History  Problem Relation Age of Onset  . Lung cancer Mother   .  Diabetes Father   . Stroke Father   . Alcohol abuse Brother   . Diabetes Brother   . Heart attack Sister   . Colon cancer Neg Hx       VITAL SIGNS BP 120/76   Pulse 76   Temp 98.1 F (36.7 C)   Resp 18   Ht 5\' 9"  (1.753 m)   Wt 169 lb (76.7 kg)   SpO2 96%   BMI 24.96 kg/m   Outpatient Encounter Medications as of 08/31/2017  Medication Sig  . acetaminophen (TYLENOL) 325 MG tablet Take 2 tablets (650 mg total) by mouth every 6 (six) hours as needed for mild pain (or Fever >/= 101).  [name] amLODipine (NORVASC) 10 MG tablet Take 10 mg by mouth daily.   [name] aspirin 325 MG tablet Take 1 tablet (325 mg total) by mouth daily.  [name] atorvastatin (LIPITOR) 20 MG tablet TAKE 1 TABLET BY MOUTH  DAILY  . buPROPion (WELLBUTRIN XL) 150 MG 24 hr tablet Take 1 tablet (150 mg total) by mouth daily.  [name] escitalopram (LEXAPRO) 5 MG tablet Take 1 tablet (5 mg total) by mouth daily.  . ferrous sulfate 325 (65 FE) MG  tablet Take 1 tablet (325 mg total) by mouth 2 (two) times daily with a meal.  . insulin aspart (NOVOLOG PENFILL) cartridge Inject 5 Units into the skin 3 (three) times daily before meals. For CBG equal to or greater than 150  . Insulin Glargine (LANTUS SOLOSTAR) 100 UNIT/ML Solostar Pen Inject 46 Units into the skin at bedtime.   [name] losartan-hydrochlorothiazide (HYZAAR) 100-25 MG tablet Take 1 tablet by mouth daily.  [name] omeprazole (PRILOSEC) 20 MG capsule TAKE 1 CAPSULE BY MOUTH  DAILY  . [DISCONTINUED] labetalol (NORMODYNE) 200 MG tablet TAKE 1 TABLET BY MOUTH TWO  TIMES DAILY (Patient not taking: Reported on 08/31/2017)   No facility-administered encounter medications on file as of 08/31/2017.      SIGNIFICANT DIAGNOSTIC EXAMS  PREVIOUS:   08-19-17: MRI of thoracic and lumbar spine: 1. Abnormal upper thoracic spinal cord signal affecting a 4.5 cm segment from T2-T3 to T4-T5. Central cord involvement with borderline to mild cord expansion. No enhancement. No other spinal cord signal abnormality, despite multilevel degenerative spinal stenosis elsewhere (see #2). In light of the patient history and Brain MRI findings today a subacute or chronic spinal cord infarct seems most likely. 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 The lack of enhancement argues against an acute inflammatory process of the cord. 2. Superimposed multilevel degenerative spinal stenosis, exacerbated in the thoracic spine by epidural lipomatosis. Predominantly mild degenerative cord mass effect occurs, but at T5-T6 there is moderate to severe stenosis with moderate cord mass effect, but no cord signal abnormality at that level. 3. Moderate degenerative lumbar spinal and lateral recess stenosis at L4-L5. 4.  No acute osseous abnormality identified in the  spine.  08-19-17: mri of brain: 1. See abnormal Thoracic Spine MRI findings today reported separately. 2. Very advanced chronic small vessel disease affecting the deep white matter, deep gray matter, and brainstem with two superimposed subacute appearing lacunar infarcts. No hemorrhage or mass effect.  08-19-17: carotid doppler: Right Carotid: Velocities in the right ICA are consistent with a 1-39% stenosis. Left Carotid: Velocities in the left ICA are consistent with a 1-39% stenosis. Vertebrals:  Bilateral vertebral arteries demonstrate antegrade  flow. Subclavians: Normal flow hemodynamics were seen in bilateral subclavian arteries.   08-20-17: 2- echo: - Left ventricle: Septal apical and inferior wall hypokinesis regionality suggests CAD. The cavity size was severely dilated. Wall thickness was increased in a pattern of severe LVH. Systolic function was moderately to severely reduced. The estimated ejection fraction was in the range of 30% to 35%. Doppler parameters are consistent with both elevated ventricular end-diastolic filling pressure and elevated left atrial filling pressure. - Atrial septum: No defect or patent foramen ovale was identified.  NO NEW EXAM   LABS REVIEWED PREVIOUS:   08-18-17: wbc 13.1; hgb 15.3;hct43.8; mcv 92.8;  plt 238; glucose 86; bun 30; creat 1.71; k+ 3.9; na++ 140; ca 9.4 08-19-17: wbc 11.8; hgb 13.4; hct 39.3; mcv 92.9 plt 196; glucose 72; bun 32; creat 1.86; k+ 3.5; na++ 141; ca 8.7; tsh 4.327; vit B 12: 517; folate 15.0; hgb a1c 9.2; copper 112; HIV/RPR: nr 08-20-17: chol 120; ldl 60 trig 181; hdl 24 08-23-17: glucose 167; bun 18; creat 1.56; k+ 3.9; na++ 138; ca 8.8  NO NEW LABS.     Review of Systems  Constitutional: Negative for malaise/fatigue.  Respiratory: Negative for cough and shortness of breath.   Cardiovascular: Negative for chest pain, palpitations and leg swelling.  Gastrointestinal: Negative for abdominal pain, constipation and heartburn.   Musculoskeletal: Negative for back pain, joint pain and myalgias.  Skin: Negative.   Neurological: Negative for dizziness.  Psychiatric/Behavioral: The patient is not nervous/anxious.     Physical Exam  Constitutional: He is oriented to person, place, and time. He appears well-developed and well-nourished. No distress.  Neck: No thyromegaly present.  Cardiovascular: Normal rate, regular rhythm and intact distal pulses.  Murmur heard. 1/6  Pulmonary/Chest: Effort normal and breath sounds normal. No respiratory distress.  Abdominal: Soft. Bowel sounds are normal. He exhibits no distension. There is no tenderness.  Musculoskeletal: Normal range of motion. He exhibits no edema.  Lymphadenopathy:    He has no cervical adenopathy.  Neurological: He is alert and oriented to person, place, and time.  Skin: Skin is warm and dry. He is not diaphoretic.    ASSESSMENT/ PLAN:  TODAY:   1. Hypertension associated with diabetes: is stable b/p 120/76; will continue labetolol 200 mg twice daily hyzaar 100/25 mg daily and norvasc  10 mg daily   2.  Type 2 diabetes mellitus uncontrolled with renal manifestations: worse; hgb a1c 9.0;  Is on arb; statin; asa  Will increase to the following lantus 55 units nightly and novolog 10 units after meals.   3. Stage 3 chronic renal impairment associated with type 2 diabetes mellitus: stable bun 18; creat 1.56; will monitor  4. Anemia of chronic disease: stable hgb 13.4; will continue iron twice daily   PREVIOUS  5. Depression major; single episode mild: is stable will continue lexapro 5 mg daily and wellbutrin xl 150 mg daily   6. Gerd without esophagitis: stable will continue prilosec 20 mg daily   7.  CVA has hemiparesis affecting left side as late effect of cerebrovascular accident: is stable will continue asa 325 mg daily   8.  Dyslipidemia associated with type 2 diabetes: stable ldl 60 will continue lipitor 20 mg daily      MD is aware of  resident's narcotic use and is in agreement with current plan of care. We will attempt to wean resident as apropriate   Ok Edwards NP [facility]  Contact [phone] Monday through Friday 8am-  5pm  After hours call [phone]

## 2017-09-07 ENCOUNTER — Non-Acute Institutional Stay (SKILLED_NURSING_FACILITY): Payer: Medicare Other | Admitting: Adult Health

## 2017-09-07 ENCOUNTER — Encounter: Payer: Self-pay | Admitting: Adult Health

## 2017-09-07 DIAGNOSIS — K219 Gastro-esophageal reflux disease without esophagitis: Secondary | ICD-10-CM

## 2017-09-07 DIAGNOSIS — F320 Major depressive disorder, single episode, mild: Secondary | ICD-10-CM

## 2017-09-07 DIAGNOSIS — E1169 Type 2 diabetes mellitus with other specified complication: Secondary | ICD-10-CM | POA: Diagnosis not present

## 2017-09-07 DIAGNOSIS — I69354 Hemiplegia and hemiparesis following cerebral infarction affecting left non-dominant side: Secondary | ICD-10-CM

## 2017-09-07 DIAGNOSIS — E785 Hyperlipidemia, unspecified: Secondary | ICD-10-CM

## 2017-09-07 DIAGNOSIS — I6389 Other cerebral infarction: Secondary | ICD-10-CM | POA: Diagnosis not present

## 2017-09-07 NOTE — Progress Notes (Signed)
Location:   [facility] Room Number: 103 A Place of Service:  SNF (31)   CODE STATUS: DNR  Allergies  Allergen Reactions  . Sulfamethoxazole     REACTION: rash    Chief Complaint  Patient presents with  . Medical Management of Chronic Issues    Cva; gerd; dyslipidemia; depression. Weekly follow up for the first 30 days post hospitalization     HPI:  He is a 57 year old long term resident of this facility being seen for the management of his chronic illnesses: cva; gerd; dyslipidemia; depression. He denies any uncontrolled pain; no fatigue; no anxiety or depressive feelings. There are no nursing concerns at this time.   Past Medical History:  Diagnosis Date  . CEREBROVASCULAR ACCIDENT, HX OF 09/28/2006   2007 affected left side and peripheral vision  . DIABETES MELLITUS, TYPE II 09/28/2006  . GERD (gastroesophageal reflux disease)   . HYPERLIPIDEMIA 09/28/2006  . HYPERTENSION 09/28/2006  . Learning disability   . Stroke [facility]) years ago    Past Surgical History:  Procedure Laterality Date  . COLONOSCOPY    . LYMPHADENECTOMY    . RHINOPLASTY    . SIGMOIDOSCOPY      Social History   Socioeconomic History  . Marital status: Divorced    Spouse name: Not on file  . Number of children: Not on file  . Years of education: Not on file  . Highest education level: Not on file  Occupational History  . Not on file  Social Needs  . Financial resource strain: Not on file  . Food insecurity:    Worry: Not on file    Inability: Not on file  . Transportation needs:    Medical: Not on file    Non-medical: Not on file  Tobacco Use  . Smoking status: Current Every Day Smoker    Packs/day: 1.00    Types: Cigarettes  . Smokeless tobacco: Former Network engineer and Sexual Activity  . Alcohol use: No    Alcohol/week: 0.0 oz  . Drug use: No  . Sexual activity: Not on file  Lifestyle  . Physical activity:    Days per week: Not on file    Minutes per session:  Not on file  . Stress: Not on file  Relationships  . Social connections:    Talks on phone: Not on file    Gets together: Not on file    Attends religious service: Not on file    Active member of club or organization: Not on file    Attends meetings of clubs or organizations: Not on file    Relationship status: Not on file  . Intimate partner violence:    Fear of current or ex partner: Not on file    Emotionally abused: Not on file    Physically abused: Not on file    Forced sexual activity: Not on file  Other Topics Concern  . Not on file  Social History Narrative   Single. Lives alone will be living in a trailer near family friends Otho Perl and husband Renee Rival- will be staying in their trailer). They watch out for him.    Sister is also involved heavily.       Disabled after CVA, low literacy at baseline      Enjoys hunting   Family History  Problem Relation Age of Onset  . Lung cancer Mother   . Diabetes Father   . Stroke Father   .  Alcohol abuse Brother   . Diabetes Brother   . Heart attack Sister   . Colon cancer Neg Hx       VITAL SIGNS BP 120/76   Pulse 76   Temp 98.1 F (36.7 C)   Resp 18   Ht 5\' 9"  (1.753 m)   Wt 164 lb (74.4 kg)   SpO2 96%   BMI 24.22 kg/m   Outpatient Encounter Medications as of 09/07/2017  Medication Sig  . acetaminophen (TYLENOL) 325 MG tablet Take 2 tablets (650 mg total) by mouth every 6 (six) hours as needed for mild pain (or Fever >/= 101).  [name] amLODipine (NORVASC) 10 MG tablet Take 10 mg by mouth daily.   [name] aspirin 325 MG tablet Take 1 tablet (325 mg total) by mouth daily.  [name] atorvastatin (LIPITOR) 20 MG tablet TAKE 1 TABLET BY MOUTH  DAILY  . buPROPion (WELLBUTRIN XL) 150 MG 24 hr tablet Take 1 tablet (150 mg total) by mouth daily.  [name] escitalopram (LEXAPRO) 5 MG tablet Take 1 tablet (5 mg total) by mouth daily.  . ferrous sulfate 325 (65 FE) MG tablet Take 1 tablet (325 mg total) by mouth 2 (two) times daily with a  meal.  . insulin aspart (NOVOLOG PENFILL) cartridge Inject 10 Units into the skin 3 (three) times daily before meals. For CBG equal to or greater than 150   . Insulin Glargine (LANTUS SOLOSTAR) 100 UNIT/ML Solostar Pen Inject 55 Units into the skin at bedtime.   [name] labetalol (NORMODYNE) 200 MG tablet Take 200 mg by mouth 2 (two) times daily.  [name] losartan-hydrochlorothiazide (HYZAAR) 100-25 MG tablet Take 1 tablet by mouth daily.  [name] omeprazole (PRILOSEC) 20 MG capsule TAKE 1 CAPSULE BY MOUTH  DAILY   No facility-administered encounter medications on file as of 09/07/2017.      SIGNIFICANT DIAGNOSTIC EXAMS  PREVIOUS:   08-19-17: MRI of thoracic and lumbar spine: 1. Abnormal upper thoracic spinal cord signal affecting a 4.5 cm segment from T2-T3 to T4-T5. Central cord involvement with borderline to mild cord expansion. No enhancement. No other spinal cord signal abnormality, despite multilevel degenerative spinal stenosis elsewhere (see #2). In light of the patient history and Brain MRI findings today a subacute or chronic spinal cord infarct seems most likely. 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 The lack of enhancement argues against an acute inflammatory process of the cord. 2. Superimposed multilevel degenerative spinal stenosis, exacerbated in the thoracic spine by epidural lipomatosis. Predominantly mild degenerative cord mass effect occurs, but at T5-T6 there is moderate to severe stenosis with moderate cord mass effect, but no cord signal abnormality at that level. 3. Moderate degenerative lumbar spinal and lateral recess stenosis at L4-L5. 4.  No acute osseous abnormality identified in the spine.  08-19-17: mri of brain: 1. See abnormal Thoracic Spine MRI findings today reported separately. 2. Very advanced chronic small  vessel disease affecting the deep white matter, deep gray matter, and brainstem with two superimposed subacute appearing lacunar infarcts. No hemorrhage or mass effect.  08-19-17: carotid doppler: Right Carotid: Velocities in the right ICA are consistent with a 1-39% stenosis. Left Carotid: Velocities in the left ICA are consistent with a 1-39% stenosis. Vertebrals:  Bilateral vertebral arteries demonstrate antegrade flow. Subclavians: Normal flow hemodynamics were seen in bilateral subclavian arteries.   08-20-17: 2- echo: -  Left ventricle: Septal apical and inferior wall hypokinesis regionality suggests CAD. The cavity size was severely dilated. Wall thickness was increased in a pattern of severe LVH. Systolic function was moderately to severely reduced. The estimated ejection fraction was in the range of 30% to 35%. Doppler parameters are consistent with both elevated ventricular end-diastolic filling pressure and elevated left atrial filling pressure. - Atrial septum: No defect or patent foramen ovale was identified.  NO NEW EXAM   LABS REVIEWED PREVIOUS:   08-18-17: wbc 13.1; hgb 15.3;hct43.8; mcv 92.8;  plt 238; glucose 86; bun 30; creat 1.71; k+ 3.9; na++ 140; ca 9.4 08-19-17: wbc 11.8; hgb 13.4; hct 39.3; mcv 92.9 plt 196; glucose 72; bun 32; creat 1.86; k+ 3.5; na++ 141; ca 8.7; tsh 4.327; vit B 12: 517; folate 15.0; hgb a1c 9.2; copper 112; HIV/RPR: nr 08-20-17: chol 120; ldl 60 trig 181; hdl 24 08-23-17: glucose 167; bun 18; creat 1.56; k+ 3.9; na++ 138; ca 8.8  NO NEW LABS.     Review of Systems  Constitutional: Negative for malaise/fatigue.  Respiratory: Negative for cough and shortness of breath.   Cardiovascular: Negative for chest pain, palpitations and leg swelling.  Gastrointestinal: Negative for abdominal pain, constipation and heartburn.  Musculoskeletal: Negative for back pain, joint pain and myalgias.  Skin: Negative.   Neurological: Negative for dizziness.    Psychiatric/Behavioral: The patient is not nervous/anxious.       Physical Exam  Constitutional: He is oriented to person, place, and time. He appears well-developed and well-nourished. No distress.  Neck: No thyromegaly present.  Cardiovascular: Normal rate, regular rhythm and intact distal pulses.  Murmur heard. 1/6  Pulmonary/Chest: Effort normal and breath sounds normal. No stridor. No respiratory distress.  Abdominal: Soft. Bowel sounds are normal. He exhibits no distension. There is no tenderness.  Musculoskeletal: Normal range of motion. He exhibits no edema.  Lymphadenopathy:    He has no cervical adenopathy.  Neurological: He is alert and oriented to person, place, and time.  Skin: Skin is warm and dry. He is not diaphoretic.  Psychiatric: He has a normal mood and affect.    ASSESSMENT/ PLAN:  TODAY:   1. Depression major; single episode mild: is stable will continue lexapro 5 mg daily and wellbutrin xl 150 mg daily   2. Jerrye Bushy without esophagitis: stable will continue prilosec 20 mg daily   3.  CVA has hemiparesis affecting left side as late effect of cerebrovascular accident: is stable will continue asa 325 mg daily   4.  Dyslipidemia associated with type 2 diabetes: stable ldl 60 will continue lipitor 20 mg daily   PREVIOUS  5. Hypertension associated with diabetes: is stable b/p 120/76; will continue labetolol 200 mg twice daily hyzaar 100/25 mg daily and norvasc  10 mg daily   6.  Type 2 diabetes mellitus uncontrolled with renal manifestations: worse; hgb a1c 9.0;  Is on arb; statin; asa  Will increase to the following lantus 55 units nightly and novolog 10 units after meals.   7. Stage 3 chronic renal impairment associated with type 2 diabetes mellitus: stable bun 18; creat 1.56; will monitor  8. Anemia of chronic disease: stable hgb 13.4; will continue iron twice daily    MD is aware of resident's narcotic use and is in agreement with current plan of care. We  will attempt to wean resident as apropriate   Ok Edwards NP [facility]  Contact [phone] Monday through Friday 8am- 5pm  After hours call  [phone]  

## 2017-09-14 ENCOUNTER — Encounter: Payer: Self-pay | Admitting: Adult Health

## 2017-09-14 ENCOUNTER — Non-Acute Institutional Stay (SKILLED_NURSING_FACILITY): Payer: Medicare Other | Admitting: Adult Health

## 2017-09-14 DIAGNOSIS — I10 Essential (primary) hypertension: Secondary | ICD-10-CM | POA: Diagnosis not present

## 2017-09-14 DIAGNOSIS — E1165 Type 2 diabetes mellitus with hyperglycemia: Secondary | ICD-10-CM | POA: Diagnosis not present

## 2017-09-14 DIAGNOSIS — N183 Chronic kidney disease, stage 3 unspecified: Secondary | ICD-10-CM

## 2017-09-14 DIAGNOSIS — IMO0002 Reserved for concepts with insufficient information to code with codable children: Secondary | ICD-10-CM

## 2017-09-14 DIAGNOSIS — D638 Anemia in other chronic diseases classified elsewhere: Secondary | ICD-10-CM | POA: Diagnosis not present

## 2017-09-14 DIAGNOSIS — E1129 Type 2 diabetes mellitus with other diabetic kidney complication: Secondary | ICD-10-CM | POA: Diagnosis not present

## 2017-09-14 DIAGNOSIS — E1159 Type 2 diabetes mellitus with other circulatory complications: Secondary | ICD-10-CM | POA: Diagnosis not present

## 2017-09-14 NOTE — Progress Notes (Signed)
Location:   [facility] Room Number: 103 A Place of Service:  SNF (31)   CODE STATUS: DNR  Allergies  Allergen Reactions  . Sulfamethoxazole     REACTION: rash    Chief Complaint  Patient presents with  . Medical Management of Chronic Issues    Hypertension; diabetes; ckd stage 3; anemia. Weekly follow up for the first 30 days post hospitalization     HPI:  He is a 57 year old long term resident of this facility being seen for the management of his chronic illnesses: hypertension; diabetes; ckd stage 3; anemia. He denies any weakness no excessive thirst or hunger; no headaches. There are no nursing concerns at this time.   Past Medical History:  Diagnosis Date  . CEREBROVASCULAR ACCIDENT, HX OF 09/28/2006   2007 affected left side and peripheral vision  . DIABETES MELLITUS, TYPE II 09/28/2006  . GERD (gastroesophageal reflux disease)   . HYPERLIPIDEMIA 09/28/2006  . HYPERTENSION 09/28/2006  . Learning disability   . Stroke [facility]) years ago    Past Surgical History:  Procedure Laterality Date  . COLONOSCOPY    . LYMPHADENECTOMY    . RHINOPLASTY    . SIGMOIDOSCOPY      Social History   Socioeconomic History  . Marital status: Divorced    Spouse name: Not on file  . Number of children: Not on file  . Years of education: Not on file  . Highest education level: Not on file  Occupational History  . Not on file  Social Needs  . Financial resource strain: Not on file  . Food insecurity:    Worry: Not on file    Inability: Not on file  . Transportation needs:    Medical: Not on file    Non-medical: Not on file  Tobacco Use  . Smoking status: Current Every Day Smoker    Packs/day: 1.00    Types: Cigarettes  . Smokeless tobacco: Former Network engineer and Sexual Activity  . Alcohol use: No    Alcohol/week: 0.0 oz  . Drug use: No  . Sexual activity: Not on file  Lifestyle  . Physical activity:    Days per week: Not on file    Minutes per  session: Not on file  . Stress: Not on file  Relationships  . Social connections:    Talks on phone: Not on file    Gets together: Not on file    Attends religious service: Not on file    Active member of club or organization: Not on file    Attends meetings of clubs or organizations: Not on file    Relationship status: Not on file  . Intimate partner violence:    Fear of current or ex partner: Not on file    Emotionally abused: Not on file    Physically abused: Not on file    Forced sexual activity: Not on file  Other Topics Concern  . Not on file  Social History Narrative   Single. Lives alone will be living in a trailer near family friends Otho Perl and husband Renee Rival- will be staying in their trailer). They watch out for him.    Sister is also involved heavily.       Disabled after CVA, low literacy at baseline      Enjoys hunting   Family History  Problem Relation Age of Onset  . Lung cancer Mother   . Diabetes Father   . Stroke Father   .  Alcohol abuse Brother   . Diabetes Brother   . Heart attack Sister   . Colon cancer Neg Hx       VITAL SIGNS BP 120/76   Pulse 76   Temp 98.1 F (36.7 C)   Resp 18   Ht 5\' 9"  (1.753 m)   Wt 164 lb (74.4 kg)   SpO2 96%   BMI 24.22 kg/m   Outpatient Encounter Medications as of 09/14/2017  Medication Sig  . acetaminophen (TYLENOL) 325 MG tablet Take 2 tablets (650 mg total) by mouth every 6 (six) hours as needed for mild pain (or Fever >/= 101).  [name] amLODipine (NORVASC) 10 MG tablet Take 10 mg by mouth daily.   [name] aspirin 325 MG tablet Take 1 tablet (325 mg total) by mouth daily.  [name] atorvastatin (LIPITOR) 20 MG tablet TAKE 1 TABLET BY MOUTH  DAILY  . buPROPion (WELLBUTRIN XL) 150 MG 24 hr tablet Take 1 tablet (150 mg total) by mouth daily.  [name] escitalopram (LEXAPRO) 5 MG tablet Take 1 tablet (5 mg total) by mouth daily.  . ferrous sulfate 325 (65 FE) MG tablet Take 1 tablet (325 mg total) by mouth 2 (two) times daily  with a meal.  . insulin aspart (NOVOLOG PENFILL) cartridge Inject 10 Units into the skin 3 (three) times daily before meals. For CBG equal to or greater than 150   . Insulin Glargine (LANTUS SOLOSTAR) 100 UNIT/ML Solostar Pen Inject 55 Units into the skin at bedtime.   [name] labetalol (NORMODYNE) 200 MG tablet Take 200 mg by mouth 2 (two) times daily.  [name] losartan-hydrochlorothiazide (HYZAAR) 100-25 MG tablet Take 1 tablet by mouth daily.  [name] omeprazole (PRILOSEC) 20 MG capsule TAKE 1 CAPSULE BY MOUTH  DAILY   No facility-administered encounter medications on file as of 09/14/2017.      SIGNIFICANT DIAGNOSTIC EXAMS PREVIOUS:   08-19-17: MRI of thoracic and lumbar spine: 1. Abnormal upper thoracic spinal cord signal affecting a 4.5 cm segment from T2-T3 to T4-T5. Central cord involvement with borderline to mild cord expansion. No enhancement. No other spinal cord signal abnormality, despite multilevel degenerative spinal stenosis elsewhere (see #2). In light of the patient history and Brain MRI findings today a subacute or chronic spinal cord infarct seems most likely. 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 The lack of enhancement argues against an acute inflammatory process of the cord. 2. Superimposed multilevel degenerative spinal stenosis, exacerbated in the thoracic spine by epidural lipomatosis. Predominantly mild degenerative cord mass effect occurs, but at T5-T6 there is moderate to severe stenosis with moderate cord mass effect, but no cord signal abnormality at that level. 3. Moderate degenerative lumbar spinal and lateral recess stenosis at L4-L5. 4.  No acute osseous abnormality identified in the spine.  08-19-17: mri of brain: 1. See abnormal Thoracic Spine MRI findings today reported separately. 2. Very advanced chronic  small vessel disease affecting the deep white matter, deep gray matter, and brainstem with two superimposed subacute appearing lacunar infarcts. No hemorrhage or mass effect.  08-19-17: carotid doppler: Right Carotid: Velocities in the right ICA are consistent with a 1-39% stenosis. Left Carotid: Velocities in the left ICA are consistent with a 1-39% stenosis. Vertebrals:  Bilateral vertebral arteries demonstrate antegrade flow. Subclavians: Normal flow hemodynamics were seen in bilateral subclavian arteries.   08-20-17: 2- echo: -  Left ventricle: Septal apical and inferior wall hypokinesis regionality suggests CAD. The cavity size was severely dilated. Wall thickness was increased in a pattern of severe LVH. Systolic function was moderately to severely reduced. The estimated ejection fraction was in the range of 30% to 35%. Doppler parameters are consistent with both elevated ventricular end-diastolic filling pressure and elevated left atrial filling pressure. - Atrial septum: No defect or patent foramen ovale was identified.  NO NEW EXAM   LABS REVIEWED PREVIOUS:   08-18-17: wbc 13.1; hgb 15.3;hct43.8; mcv 92.8;  plt 238; glucose 86; bun 30; creat 1.71; k+ 3.9; na++ 140; ca 9.4 08-19-17: wbc 11.8; hgb 13.4; hct 39.3; mcv 92.9 plt 196; glucose 72; bun 32; creat 1.86; k+ 3.5; na++ 141; ca 8.7; tsh 4.327; vit B 12: 517; folate 15.0; hgb a1c 9.2; copper 112; HIV/RPR: nr 08-20-17: chol 120; ldl 60 trig 181; hdl 24 08-23-17: glucose 167; bun 18; creat 1.56; k+ 3.9; na++ 138; ca 8.8  NO NEW LABS.     Review of Systems  Constitutional: Negative for malaise/fatigue.  Respiratory: Negative for cough and shortness of breath.   Cardiovascular: Negative for chest pain, palpitations and leg swelling.  Gastrointestinal: Negative for abdominal pain, constipation and heartburn.  Musculoskeletal: Negative for back pain, joint pain and myalgias.  Skin: Negative.   Neurological: Negative for dizziness.    Psychiatric/Behavioral: The patient is not nervous/anxious.     Physical Exam  Constitutional: He is oriented to person, place, and time. He appears well-developed and well-nourished. No distress.  Neck: No thyromegaly present.  Cardiovascular: Normal rate, regular rhythm and intact distal pulses.  Murmur heard. 1/6  Pulmonary/Chest: Effort normal and breath sounds normal. No respiratory distress.  Abdominal: Soft. Bowel sounds are normal. He exhibits no distension. There is no tenderness.  Musculoskeletal: Normal range of motion. He exhibits no edema.  Lymphadenopathy:    He has no cervical adenopathy.  Neurological: He is alert and oriented to person, place, and time.  Skin: Skin is warm and dry. He is not diaphoretic.  Psychiatric: He has a normal mood and affect.    ASSESSMENT/ PLAN:  TODAY:   1. Hypertension associated with diabetes: is stable b/p 120/76; will continue labetolol 200 mg twice daily hyzaar 100/25 mg daily and norvasc  10 mg daily   2.  Type 2 diabetes mellitus uncontrolled with renal manifestations: worse; hgb a1c 9.0;  Is on arb; statin; asa  Will increase to the following lantus 55 units nightly and novolog 10 units after meals.   3. Stage 3 chronic renal impairment associated with type 2 diabetes mellitus: stable bun 18; creat 1.56; will monitor  4. Anemia of chronic disease: stable hgb 13.4; will continue iron twice daily    PREVIOUS  5. Depression major; single episode mild: is stable will continue lexapro 5 mg daily and wellbutrin xl 150 mg daily   6. Gerd without esophagitis: stable will continue prilosec 20 mg daily   7.  CVA has hemiparesis affecting left side as late effect of cerebrovascular accident: is stable will continue asa 325 mg daily   8.  Dyslipidemia associated with type 2 diabetes: stable ldl 60 will continue lipitor 20 mg daily      MD is aware of resident's narcotic use and is in agreement with current plan of care. We will  attempt to wean resident as apropriate   Ok Edwards NP [facility]  Contact [phone] Monday through Friday 8am- 5pm  After hours call [phone]

## 2017-09-22 ENCOUNTER — Encounter: Payer: Self-pay | Admitting: Adult Health

## 2017-09-22 ENCOUNTER — Non-Acute Institutional Stay (SKILLED_NURSING_FACILITY): Payer: Medicare Other | Admitting: Adult Health

## 2017-09-22 DIAGNOSIS — K219 Gastro-esophageal reflux disease without esophagitis: Secondary | ICD-10-CM

## 2017-09-22 DIAGNOSIS — I69354 Hemiplegia and hemiparesis following cerebral infarction affecting left non-dominant side: Secondary | ICD-10-CM | POA: Diagnosis not present

## 2017-09-22 DIAGNOSIS — E1169 Type 2 diabetes mellitus with other specified complication: Secondary | ICD-10-CM

## 2017-09-22 DIAGNOSIS — E785 Hyperlipidemia, unspecified: Secondary | ICD-10-CM | POA: Diagnosis not present

## 2017-09-22 DIAGNOSIS — I6389 Other cerebral infarction: Secondary | ICD-10-CM | POA: Diagnosis not present

## 2017-09-22 DIAGNOSIS — F320 Major depressive disorder, single episode, mild: Secondary | ICD-10-CM

## 2017-09-22 NOTE — Progress Notes (Signed)
Location:   [facility] Room Number: 103 A Place of Service:  SNF (31)   CODE STATUS: DNR  Allergies  Allergen Reactions  . Sulfamethoxazole     REACTION: rash    Chief Complaint  Patient presents with  . Medical Management of Chronic Issues    Cva; dyslipidemia gerd; depression     HPI:  He is a 57 year old long term resident of this facility being seen for the management of his chronic illnesses: cva; with hemiparesis; dyslipidemia; gerd; depression. He denies any heart burn; anxiety; no chest pain. There are no nursing concerns at this time.   Past Medical History:  Diagnosis Date  . CEREBROVASCULAR ACCIDENT, HX OF 09/28/2006   2007 affected left side and peripheral vision  . DIABETES MELLITUS, TYPE II 09/28/2006  . GERD (gastroesophageal reflux disease)   . HYPERLIPIDEMIA 09/28/2006  . HYPERTENSION 09/28/2006  . Learning disability   . Stroke [facility]) years ago    Past Surgical History:  Procedure Laterality Date  . COLONOSCOPY    . LYMPHADENECTOMY    . RHINOPLASTY    . SIGMOIDOSCOPY      Social History   Socioeconomic History  . Marital status: Divorced    Spouse name: Not on file  . Number of children: Not on file  . Years of education: Not on file  . Highest education level: Not on file  Occupational History  . Not on file  Social Needs  . Financial resource strain: Not on file  . Food insecurity:    Worry: Not on file    Inability: Not on file  . Transportation needs:    Medical: Not on file    Non-medical: Not on file  Tobacco Use  . Smoking status: Current Every Day Smoker    Packs/day: 1.00    Types: Cigarettes  . Smokeless tobacco: Former Network engineer and Sexual Activity  . Alcohol use: No    Alcohol/week: 0.0 oz  . Drug use: No  . Sexual activity: Not on file  Lifestyle  . Physical activity:    Days per week: Not on file    Minutes per session: Not on file  . Stress: Not on file  Relationships  . Social  connections:    Talks on phone: Not on file    Gets together: Not on file    Attends religious service: Not on file    Active member of club or organization: Not on file    Attends meetings of clubs or organizations: Not on file    Relationship status: Not on file  . Intimate partner violence:    Fear of current or ex partner: Not on file    Emotionally abused: Not on file    Physically abused: Not on file    Forced sexual activity: Not on file  Other Topics Concern  . Not on file  Social History Narrative   Single. Lives alone will be living in a trailer near family friends Otho Perl and husband Renee Rival- will be staying in their trailer). They watch out for him.    Sister is also involved heavily.       Disabled after CVA, low literacy at baseline      Enjoys hunting   Family History  Problem Relation Age of Onset  . Lung cancer Mother   . Diabetes Father   . Stroke Father   . Alcohol abuse Brother   . Diabetes Brother   .  Heart attack Sister   . Colon cancer Neg Hx       VITAL SIGNS BP 132/78   Pulse 74   Temp 98 F (36.7 C)   Resp 18   Ht 5\' 9"  (1.753 m)   Wt 164 lb (74.4 kg)   SpO2 99%   BMI 24.22 kg/m   Outpatient Encounter Medications as of 09/22/2017  Medication Sig  . acetaminophen (TYLENOL) 325 MG tablet Take 2 tablets (650 mg total) by mouth every 6 (six) hours as needed for mild pain (or Fever >/= 101).  [name] amLODipine (NORVASC) 10 MG tablet Take 10 mg by mouth daily.   [name] aspirin 325 MG tablet Take 1 tablet (325 mg total) by mouth daily.  [name] atorvastatin (LIPITOR) 20 MG tablet TAKE 1 TABLET BY MOUTH  DAILY  . buPROPion (WELLBUTRIN XL) 150 MG 24 hr tablet Take 1 tablet (150 mg total) by mouth daily.  [name] escitalopram (LEXAPRO) 5 MG tablet Take 1 tablet (5 mg total) by mouth daily.  . ferrous sulfate 325 (65 FE) MG tablet Take 1 tablet (325 mg total) by mouth 2 (two) times daily with a meal.  . insulin aspart (NOVOLOG PENFILL) cartridge Inject 13 Units  into the skin 3 (three) times daily before meals. For CBG equal to or greater than 150   . Insulin Glargine (LANTUS SOLOSTAR) 100 UNIT/ML Solostar Pen Inject 55 Units into the skin at bedtime.   [name] labetalol (NORMODYNE) 200 MG tablet Take 200 mg by mouth 2 (two) times daily.  [name] losartan-hydrochlorothiazide (HYZAAR) 100-25 MG tablet Take 1 tablet by mouth daily.  [name] omeprazole (PRILOSEC) 20 MG capsule TAKE 1 CAPSULE BY MOUTH  DAILY   No facility-administered encounter medications on file as of 09/22/2017.      SIGNIFICANT DIAGNOSTIC EXAMS  PREVIOUS:   08-19-17: MRI of thoracic and lumbar spine: 1. Abnormal upper thoracic spinal cord signal affecting a 4.5 cm segment from T2-T3 to T4-T5. Central cord involvement with borderline to mild cord expansion. No enhancement. No other spinal cord signal abnormality, despite multilevel degenerative spinal stenosis elsewhere (see #2). In light of the patient history and Brain MRI findings today a subacute or chronic spinal cord infarct seems most likely. 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 The lack of enhancement argues against an acute inflammatory process of the cord. 2. Superimposed multilevel degenerative spinal stenosis, exacerbated in the thoracic spine by epidural lipomatosis. Predominantly mild degenerative cord mass effect occurs, but at T5-T6 there is moderate to severe stenosis with moderate cord mass effect, but no cord signal abnormality at that level. 3. Moderate degenerative lumbar spinal and lateral recess stenosis at L4-L5. 4.  No acute osseous abnormality identified in the spine.  08-19-17: mri of brain: 1. See abnormal Thoracic Spine MRI findings today reported separately. 2. Very advanced chronic small vessel disease affecting the deep white matter, deep gray matter, and  brainstem with two superimposed subacute appearing lacunar infarcts. No hemorrhage or mass effect.  08-19-17: carotid doppler: Right Carotid: Velocities in the right ICA are consistent with a 1-39% stenosis. Left Carotid: Velocities in the left ICA are consistent with a 1-39% stenosis. Vertebrals:  Bilateral vertebral arteries demonstrate antegrade flow. Subclavians: Normal flow hemodynamics were seen in bilateral subclavian arteries.   08-20-17: 2- echo: - Left ventricle: Septal apical and inferior wall hypokinesis regionality suggests  CAD. The cavity size was severely dilated. Wall thickness was increased in a pattern of severe LVH. Systolic function was moderately to severely reduced. The estimated ejection fraction was in the range of 30% to 35%. Doppler parameters are consistent with both elevated ventricular end-diastolic filling pressure and elevated left atrial filling pressure. - Atrial septum: No defect or patent foramen ovale was identified.  NO NEW EXAM   LABS REVIEWED PREVIOUS:   08-18-17: wbc 13.1; hgb 15.3;hct43.8; mcv 92.8;  plt 238; glucose 86; bun 30; creat 1.71; k+ 3.9; na++ 140; ca 9.4 08-19-17: wbc 11.8; hgb 13.4; hct 39.3; mcv 92.9 plt 196; glucose 72; bun 32; creat 1.86; k+ 3.5; na++ 141; ca 8.7; tsh 4.327; vit B 12: 517; folate 15.0; hgb a1c 9.2; copper 112; HIV/RPR: nr 08-20-17: chol 120; ldl 60 trig 181; hdl 24 08-23-17: glucose 167; bun 18; creat 1.56; k+ 3.9; na++ 138; ca 8.8  NO NEW LABS.     Review of Systems  Constitutional: Negative for malaise/fatigue.  Respiratory: Negative for cough and shortness of breath.   Cardiovascular: Negative for chest pain, palpitations and leg swelling.  Gastrointestinal: Negative for abdominal pain, constipation and heartburn.  Musculoskeletal: Negative for back pain, joint pain and myalgias.  Skin: Negative.   Neurological: Negative for dizziness.  Psychiatric/Behavioral: The patient is not nervous/anxious.     Physical Exam    Constitutional: He is oriented to person, place, and time. He appears well-developed and well-nourished. No distress.  Neck: No thyromegaly present.  Cardiovascular: Normal rate, regular rhythm and intact distal pulses.  Murmur heard. 1/6  Pulmonary/Chest: Effort normal and breath sounds normal. No respiratory distress.  Abdominal: Soft. Bowel sounds are normal. He exhibits no distension. There is no tenderness.  Musculoskeletal: Normal range of motion. He exhibits no edema.  Lymphadenopathy:    He has no cervical adenopathy.  Neurological: He is alert and oriented to person, place, and time.  Skin: Skin is warm and dry. He is not diaphoretic.  Psychiatric: He has a normal mood and affect.    ASSESSMENT/ PLAN:  TODAY:   1. Depression major; single episode mild: is stable will continue lexapro 5 mg daily and wellbutrin xl 150 mg daily   2. Jerrye Bushy without esophagitis: stable will continue prilosec 20 mg daily   3.  CVA has hemiparesis affecting left side as late effect of cerebrovascular accident: is stable will continue asa 325 mg daily   4.  Dyslipidemia associated with type 2 diabetes: stable ldl 60 will continue lipitor 20 mg daily   PREVIOUS  5. Hypertension associated with diabetes: is stable b/p 132/78; will continue labetolol 200 mg twice daily hyzaar 100/25 mg daily and norvasc  10 mg daily   6.  Type 2 diabetes mellitus uncontrolled with renal manifestations: without change hgb a1c 9.0;  Is on arb; statin; asa  Will continue  lantus 55 units nightly and novolog 13 units after meals.  Will begin januvia 25 mg daily   7. Stage 3 chronic renal impairment associated with type 2 diabetes mellitus: stable bun 18; creat 1.56; will monitor  8. Anemia of chronic disease: stable hgb 13.4; will continue iron twice daily    MD is aware of resident's narcotic use and is in agreement with current plan of care. We will attempt to wean resident as apropriate   Ok Edwards  NP [facility]  Contact [phone] Monday through Friday 8am- 5pm  After hours call [phone]

## 2017-10-11 ENCOUNTER — Encounter: Payer: Self-pay | Admitting: Adult Health

## 2017-10-11 ENCOUNTER — Non-Acute Institutional Stay (SKILLED_NURSING_FACILITY): Payer: Medicare Other | Admitting: Adult Health

## 2017-10-11 DIAGNOSIS — E785 Hyperlipidemia, unspecified: Secondary | ICD-10-CM

## 2017-10-11 DIAGNOSIS — E1159 Type 2 diabetes mellitus with other circulatory complications: Secondary | ICD-10-CM | POA: Diagnosis not present

## 2017-10-11 DIAGNOSIS — E1169 Type 2 diabetes mellitus with other specified complication: Secondary | ICD-10-CM | POA: Diagnosis not present

## 2017-10-11 DIAGNOSIS — E1122 Type 2 diabetes mellitus with diabetic chronic kidney disease: Secondary | ICD-10-CM | POA: Diagnosis not present

## 2017-10-11 DIAGNOSIS — N183 Chronic kidney disease, stage 3 unspecified: Secondary | ICD-10-CM

## 2017-10-11 DIAGNOSIS — Z794 Long term (current) use of insulin: Secondary | ICD-10-CM

## 2017-10-11 DIAGNOSIS — I152 Hypertension secondary to endocrine disorders: Secondary | ICD-10-CM

## 2017-10-11 DIAGNOSIS — I10 Essential (primary) hypertension: Secondary | ICD-10-CM

## 2017-10-11 NOTE — Progress Notes (Signed)
Location:   [facility] Room Number: 103 A Place of Service:  SNF (31)   CODE STATUS: DNR  Allergies  Allergen Reactions  . Sulfamethoxazole     REACTION: rash    Chief Complaint  Patient presents with  . Acute Visit    DM    HPI:  His cbg readings have been improving over the past several days. He is tolerating his medications without difficulty. He did have a hypoglycemic episode today. He did respond to treatment. There are no reports of missed doses of his medication; no reports of changes in appetite.    Past Medical History:  Diagnosis Date  . CEREBROVASCULAR ACCIDENT, HX OF 09/28/2006   2007 affected left side and peripheral vision  . DIABETES MELLITUS, TYPE II 09/28/2006  . GERD (gastroesophageal reflux disease)   . HYPERLIPIDEMIA 09/28/2006  . HYPERTENSION 09/28/2006  . Learning disability   . Stroke [facility]) years ago    Past Surgical History:  Procedure Laterality Date  . COLONOSCOPY    . LYMPHADENECTOMY    . RHINOPLASTY    . SIGMOIDOSCOPY      Social History   Socioeconomic History  . Marital status: Divorced    Spouse name: Not on file  . Number of children: Not on file  . Years of education: Not on file  . Highest education level: Not on file  Occupational History  . Not on file  Social Needs  . Financial resource strain: Not on file  . Food insecurity:    Worry: Not on file    Inability: Not on file  . Transportation needs:    Medical: Not on file    Non-medical: Not on file  Tobacco Use  . Smoking status: Current Every Day Smoker    Packs/day: 1.00    Types: Cigarettes  . Smokeless tobacco: Former Network engineer and Sexual Activity  . Alcohol use: No    Alcohol/week: 0.0 oz  . Drug use: No  . Sexual activity: Not on file  Lifestyle  . Physical activity:    Days per week: Not on file    Minutes per session: Not on file  . Stress: Not on file  Relationships  . Social connections:    Talks on phone: Not on file    Gets together: Not on file    Attends religious service: Not on file    Active member of club or organization: Not on file    Attends meetings of clubs or organizations: Not on file    Relationship status: Not on file  . Intimate partner violence:    Fear of current or ex partner: Not on file    Emotionally abused: Not on file    Physically abused: Not on file    Forced sexual activity: Not on file  Other Topics Concern  . Not on file  Social History Narrative   Single. Lives alone will be living in a trailer near family friends Otho Perl and husband Renee Rival- will be staying in their trailer). They watch out for him.    Sister is also involved heavily.       Disabled after CVA, low literacy at baseline      Enjoys hunting   Family History  Problem Relation Age of Onset  . Lung cancer Mother   . Diabetes Father   . Stroke Father   . Alcohol abuse Brother   . Diabetes Brother   . Heart attack Sister   .  Colon cancer Neg Hx       VITAL SIGNS BP 134/78   Pulse 74   Temp 97.6 F (36.4 C)   Resp 16   Ht 5\' 9"  (1.753 m)   Wt 169 lb (76.7 kg)   SpO2 99%   BMI 24.96 kg/m   Outpatient Encounter Medications as of 10/11/2017  Medication Sig  . acetaminophen (TYLENOL) 325 MG tablet Take 2 tablets (650 mg total) by mouth every 6 (six) hours as needed for mild pain (or Fever >/= 101).  [name] amLODipine (NORVASC) 10 MG tablet Take 10 mg by mouth daily.   [name] aspirin 325 MG tablet Take 1 tablet (325 mg total) by mouth daily.  [name] atorvastatin (LIPITOR) 20 MG tablet TAKE 1 TABLET BY MOUTH  DAILY  . buPROPion (WELLBUTRIN XL) 150 MG 24 hr tablet Take 1 tablet (150 mg total) by mouth daily.  [name] escitalopram (LEXAPRO) 5 MG tablet Take 1 tablet (5 mg total) by mouth daily.  . ferrous sulfate 325 (65 FE) MG tablet Take 1 tablet (325 mg total) by mouth 2 (two) times daily with a meal.  . insulin aspart (NOVOLOG PENFILL) cartridge Inject 13 Units into the skin 3 (three) times daily before  meals. For CBG equal to or greater than 150   . Insulin Glargine (LANTUS SOLOSTAR) 100 UNIT/ML Solostar Pen Inject 55 Units into the skin at bedtime.   [name] labetalol (NORMODYNE) 200 MG tablet Take 200 mg by mouth 2 (two) times daily.  [name] linagliptin (TRADJENTA) 5 MG TABS tablet Take 5 mg by mouth daily.  [name] losartan-hydrochlorothiazide (HYZAAR) 100-25 MG tablet Take 1 tablet by mouth daily.  [name] omeprazole (PRILOSEC) 20 MG capsule TAKE 1 CAPSULE BY MOUTH  DAILY   No facility-administered encounter medications on file as of 10/11/2017.      SIGNIFICANT DIAGNOSTIC EXAMS  PREVIOUS:   08-19-17: MRI of thoracic and lumbar spine: 1. Abnormal upper thoracic spinal cord signal affecting a 4.5 cm segment from T2-T3 to T4-T5. Central cord involvement with borderline to mild cord expansion. No enhancement. No other spinal cord signal abnormality, despite multilevel degenerative spinal stenosis elsewhere (see #2). In light of the patient history and Brain MRI findings today a subacute or chronic spinal cord infarct seems most likely. 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 The lack of enhancement argues against an acute inflammatory process of the cord. 2. Superimposed multilevel degenerative spinal stenosis, exacerbated in the thoracic spine by epidural lipomatosis. Predominantly mild degenerative cord mass effect occurs, but at T5-T6 there is moderate to severe stenosis with moderate cord mass effect, but no cord signal abnormality at that level. 3. Moderate degenerative lumbar spinal and lateral recess stenosis at L4-L5. 4.  No acute osseous abnormality identified in the spine.  08-19-17: mri of brain: 1. See abnormal Thoracic Spine MRI findings today reported separately. 2. Very advanced chronic small vessel disease affecting the deep white  matter, deep gray matter, and brainstem with two superimposed subacute appearing lacunar infarcts. No hemorrhage or mass effect.  08-19-17: carotid doppler: Right Carotid: Velocities in the right ICA are consistent with a 1-39% stenosis. Left Carotid: Velocities in the left ICA are consistent with a 1-39% stenosis. Vertebrals:  Bilateral vertebral arteries demonstrate antegrade flow. Subclavians: Normal flow hemodynamics were seen in bilateral subclavian arteries.   08-20-17: 2- echo: - Left ventricle:  Septal apical and inferior wall hypokinesis regionality suggests CAD. The cavity size was severely dilated. Wall thickness was increased in a pattern of severe LVH. Systolic function was moderately to severely reduced. The estimated ejection fraction was in the range of 30% to 35%. Doppler parameters are consistent with both elevated ventricular end-diastolic filling pressure and elevated left atrial filling pressure. - Atrial septum: No defect or patent foramen ovale was identified.  NO NEW EXAM   LABS REVIEWED PREVIOUS:   08-18-17: wbc 13.1; hgb 15.3;hct43.8; mcv 92.8;  plt 238; glucose 86; bun 30; creat 1.71; k+ 3.9; na++ 140; ca 9.4 08-19-17: wbc 11.8; hgb 13.4; hct 39.3; mcv 92.9 plt 196; glucose 72; bun 32; creat 1.86; k+ 3.5; na++ 141; ca 8.7; tsh 4.327; vit B 12: 517; folate 15.0; hgb a1c 9.2; copper 112; HIV/RPR: nr 08-20-17: chol 120; ldl 60 trig 181; hdl 24 08-23-17: glucose 167; bun 18; creat 1.56; k+ 3.9; na++ 138; ca 8.8  NO NEW LABS.      Review of Systems  Constitutional: Negative for malaise/fatigue.  Respiratory: Negative for cough and shortness of breath.   Cardiovascular: Negative for chest pain, palpitations and leg swelling.  Gastrointestinal: Negative for abdominal pain, constipation and heartburn.  Musculoskeletal: Negative for back pain, joint pain and myalgias.  Skin: Negative.   Neurological: Negative for dizziness.  Psychiatric/Behavioral: The patient is not  nervous/anxious.       Physical Exam  Constitutional: He is oriented to person, place, and time. He appears well-developed and well-nourished. No distress.  Neck: No thyromegaly present.  Cardiovascular: Normal rate, regular rhythm and intact distal pulses.  Murmur heard. 1/6  Pulmonary/Chest: Effort normal and breath sounds normal. No respiratory distress.  Abdominal: Soft. Bowel sounds are normal. He exhibits no distension. There is no tenderness.  Musculoskeletal: Normal range of motion. He exhibits no edema.  Lymphadenopathy:    He has no cervical adenopathy.  Neurological: He is alert and oriented to person, place, and time.  Skin: Skin is warm and dry. He is not diaphoretic.  Psychiatric: He has a normal mood and affect.     ASSESSMENT/ PLAN:  TODAY:   1.  Dyslipidemia associated with type 2 diabetes: stable ldl 60 will continue lipitor 20 mg daily   2. Hypertension associated with diabetes: is stable b/p 134/78; will continue labetolol 200 mg twice daily hyzaar 100/25 mg daily and norvasc  10 mg daily   3.  Type 2 diabetes mellitus uncontrolled with renal manifestations: without change hgb a1c 9.0;  Is on arb; statin; asa  Will continue  lantus 55 units nightly and novolog 13 units after meals and tradjenta 5 mg daily    MD is aware of resident's narcotic use and is in agreement with current plan of care. We will attempt to wean resident as apropriate   Ok Edwards NP [facility]  Contact [phone] Monday through Friday 8am- 5pm  After hours call [phone]

## 2017-10-22 ENCOUNTER — Encounter: Payer: Self-pay | Admitting: Adult Health

## 2017-10-22 NOTE — Progress Notes (Signed)
Entered in error

## 2017-10-28 ENCOUNTER — Encounter: Payer: Self-pay | Admitting: Internal Medicine

## 2017-10-28 ENCOUNTER — Non-Acute Institutional Stay (SKILLED_NURSING_FACILITY): Payer: Medicare Other | Admitting: Internal Medicine

## 2017-10-28 DIAGNOSIS — E1169 Type 2 diabetes mellitus with other specified complication: Secondary | ICD-10-CM

## 2017-10-28 DIAGNOSIS — E1122 Type 2 diabetes mellitus with diabetic chronic kidney disease: Secondary | ICD-10-CM | POA: Diagnosis not present

## 2017-10-28 DIAGNOSIS — E1159 Type 2 diabetes mellitus with other circulatory complications: Secondary | ICD-10-CM | POA: Diagnosis not present

## 2017-10-28 DIAGNOSIS — N183 Chronic kidney disease, stage 3 unspecified: Secondary | ICD-10-CM

## 2017-10-28 DIAGNOSIS — I69354 Hemiplegia and hemiparesis following cerebral infarction affecting left non-dominant side: Secondary | ICD-10-CM | POA: Diagnosis not present

## 2017-10-28 DIAGNOSIS — I10 Essential (primary) hypertension: Secondary | ICD-10-CM | POA: Diagnosis not present

## 2017-10-28 DIAGNOSIS — E785 Hyperlipidemia, unspecified: Secondary | ICD-10-CM

## 2017-10-28 DIAGNOSIS — Z794 Long term (current) use of insulin: Secondary | ICD-10-CM

## 2017-10-28 NOTE — Progress Notes (Signed)
Patient ID: William [name], male   DOB: 02-Jul-1960, 57 y.o.   MRN: [id number]  Location:  [place] [place] Room Number: [place] of Service:  SNF (31) Provider:  DR Keiara Sneeringer Myrene Galas, MD  Patient Care Team: Marin Olp, MD as PCP - General (Family Medicine)  Extended Emergency Contact Information Primary Emergency Contact: Smith,Goldie Address: [address], [place] Phone: [phone] Work Phone: [phone] Mobile Phone: [phone] Relation: Sister Secondary Emergency Contact: Smith,Rachel  [place] Phone: [phone] Relation: Friend  Code Status:  DNR Goals of care: Advanced Directive information Advanced Directives 10/28/2017  Does Patient Have a Medical Advance Directive? Yes  Type of Advance Directive Out of facility DNR (pink MOST or yellow form)  Does patient want to make changes to medical advance directive? No - Patient declined  Would patient like information on creating a medical advance directive? -  Pre-existing out of facility DNR order (yellow form or pink MOST form) Yellow form placed in chart (order not valid for inpatient use)     Chief Complaint  Patient presents with  . Medical Management of Chronic Issues        HPI:  Pt is a 57 y.o. male seen today for medical management of chronic diseases.  He has no concerns. No nursing issues. No falls. Appetite excellent and he sleeps well. Tolerating tx. He still has slurred speech and left sided weakness. CBGs 200-400; occasionally in 100s. No low BS reactions.  MDD - mood stable on lexapro 5 mg daily and wellbutrin xl 150 mg daily   GERD - stable on prilosec 20 mg daily   Hx CVA - he has left hemiparesis. Takes ASA 325 mg daily   Dyslipidemia - stable on lipitor 20 mg daily; LDL 60   HTN - stable on labetolol 200 mg twice daily; hyzaar 100/25 mg daily; norvasc  10 mg daily   DM -  uncontrolled. A1c 9%; takes ARB, statin and ASA daily. Insulin dependent  (lantus 55 units nightly and novolog 13 units after meals). He also takes [place] 25 mg daily. CBGs 200-400s with occasional 100s  CKD - stage 3. Cr 1.56  Anemia of chronic disease - stable on iron twice daily     Past Medical History:  Diagnosis Date  . CEREBROVASCULAR ACCIDENT, HX OF 09/28/2006   2007 affected left side and peripheral vision  . DIABETES MELLITUS, TYPE II 09/28/2006  . GERD (gastroesophageal reflux disease)   . HYPERLIPIDEMIA 09/28/2006  . HYPERTENSION 09/28/2006  . Learning disability   . Stroke [facility]) years ago   Past Surgical History:  Procedure Laterality Date  . COLONOSCOPY    . LYMPHADENECTOMY    . RHINOPLASTY    . SIGMOIDOSCOPY      Allergies  Allergen Reactions  . Sulfamethoxazole     REACTION: rash    Outpatient Encounter Medications as of 10/28/2017  Medication Sig  . acetaminophen (TYLENOL) 325 MG tablet Take 2 tablets (650 mg total) by mouth every 6 (six) hours as needed for mild pain (or Fever >/= 101).  [name] amLODipine (NORVASC) 10 MG tablet Take 10 mg by mouth daily.   [name] aspirin 325 MG tablet Take 1 tablet (325 mg total) by mouth daily.  [name] atorvastatin (LIPITOR) 20 MG tablet TAKE 1 TABLET BY MOUTH  DAILY  . buPROPion (WELLBUTRIN XL) 150 MG 24 hr  tablet Take 1 tablet (150 mg total) by mouth daily.  [name] escitalopram (LEXAPRO) 5 MG tablet Take 1 tablet (5 mg total) by mouth daily.  . ferrous sulfate 325 (65 FE) MG tablet Take 1 tablet (325 mg total) by mouth 2 (two) times daily with a meal.  . insulin aspart (NOVOLOG PENFILL) cartridge Inject 13 Units into the skin 3 (three) times daily before meals. For CBG equal to or greater than 150   . Insulin Glargine (LANTUS SOLOSTAR) 100 UNIT/ML Solostar Pen Inject 55 Units into the skin at bedtime.   [name] labetalol (NORMODYNE) 200 MG tablet Take 200 mg by mouth 2 (two) times daily.  [name] linagliptin (TRADJENTA) 5 MG TABS tablet Take 5 mg by  mouth daily.  [name] losartan-hydrochlorothiazide (HYZAAR) 100-25 MG tablet Take 1 tablet by mouth daily.  [name] omeprazole (PRILOSEC) 20 MG capsule TAKE 1 CAPSULE BY MOUTH  DAILY   No facility-administered encounter medications on file as of 10/28/2017.     Review of Systems  Musculoskeletal: Positive for gait problem.  Neurological: Positive for weakness.  All other systems reviewed and are negative.   Immunization History  Administered Date(s) Administered  . Influenza Split 12/30/2011  . Influenza Whole 05/23/2007, 01/30/2008, 01/24/2009, 01/21/2010, 01/19/2011  . Influenza,inj,Quad PF,6+ Mos 02/02/2013, 01/02/2014, 03/11/2015, 02/06/2016, 02/09/2017  . PPD Test 08/23/2017  . Pneumococcal Polysaccharide-23 12/25/2010, 08/23/2017  . Tdap 12/25/2010   Pertinent  Health Maintenance Due  Topic Date Due  . FOOT EXAM  08/25/2018 (Originally 06/09/2017)  . OPHTHALMOLOGY EXAM  08/25/2018 (Originally 07/13/2017)  . INFLUENZA VACCINE  11/18/2017  . HEMOGLOBIN A1C  02/19/2018  . COLONOSCOPY  09/02/2025   Fall Risk  02/09/2017  Falls in the past year? Yes  Number falls in past yr: 1  Injury with Fall? No   Functional Status Survey:    Vitals:   10/28/17 1435  BP: 139/77  Pulse: 77  Resp: 18  Temp: 98.1 F (36.7 C)  SpO2: 98%  Weight: 168 lb 3.2 oz (76.3 kg)  Height: 5\' 8"  (1.727 m)   Body mass index is 25.57 kg/m. Physical Exam  Constitutional: He is oriented to person, place, and time. He appears well-developed and well-nourished.  Lying in bed in NAD  HENT:  Mouth/Throat: Oropharynx is clear and moist.  MMM; no oral thrush  Eyes: Pupils are equal, round, and reactive to light. No scleral icterus.  Neck: Neck supple. Carotid bruit is not present. No thyromegaly present.  Cardiovascular: Normal rate, regular rhythm and intact distal pulses. Exam reveals no gallop and no friction rub.  Murmur (1/6 SEM) heard. no distal LE swelling. No calf TTP  Pulmonary/Chest: Effort normal  and breath sounds normal. He has no wheezes. He has no rales. He exhibits no tenderness.  Abdominal: Soft. Bowel sounds are normal. He exhibits no distension, no abdominal bruit, no pulsatile midline mass and no mass. There is no hepatomegaly. There is no tenderness. There is no rebound and no guarding.  Lymphadenopathy:    He has no cervical adenopathy.  Neurological: He is alert and oriented to person, place, and time. He has normal reflexes. Gait abnormal.  strength 4/5 in LUE/LLE  Skin: Skin is warm and dry. No rash noted.  Psychiatric: He has a normal mood and affect. His behavior is normal. Thought content normal. His speech is slurred.    Labs reviewed: Recent Labs    08/18/17 1917 08/19/17 0319 08/23/17 0407  NA 140 141 138  K 3.9 3.5 3.9  CL 102 104 99*  CO2 28 27 29   GLUCOSE 86 72 167*  BUN 30* 32* 18  CREATININE 1.71* 1.86* 1.56*  CALCIUM 9.4 8.7* 8.8*   Recent Labs    11/03/16 1045 05/25/17 1612  AST 11 10  ALT 11 6  ALKPHOS 128* 106  BILITOT 0.4 0.4  PROT 6.7 6.9  ALBUMIN 3.7 3.8   Recent Labs    05/25/17 1612 08/18/17 1917 08/19/17 0319  WBC 11.0* 13.1* 11.8*  HGB 14.1 15.3 13.4  HCT 41.7 43.8 39.3  MCV 93.5 92.8 92.9  PLT 288.0 238 196   Lab Results  Component Value Date   TSH 4.327 08/19/2017   Lab Results  Component Value Date   HGBA1C 9.2 (H) 08/19/2017   Lab Results  Component Value Date   CHOL 120 08/20/2017   HDL 24 (L) 08/20/2017   LDLCALC 60 08/20/2017   LDLDIRECT 88.0 06/09/2016   TRIG 181 (H) 08/20/2017   CHOLHDL 5.0 08/20/2017    Significant Diagnostic Results in last 30 days:  No results found.  Assessment/Plan   ICD-10-CM   1. Hemiparesis affecting left side as late effect of cerebrovascular accident [facility]) I69.354   2. Hypertension associated with diabetes ([place]) E11.59    I10   3. Type 2 diabetes mellitus with stage 3 chronic kidney disease, with long-term current use of insulin ([facility]) E11.22    N18.3    Z79.4   4.  Dyslipidemia associated with type 2 diabetes mellitus ([place]) E11.69    E78.5     Cont current meds as ordered  PT/OT/ST as ordered  Follow up with specialists as scheduled  Will follow  Labs/tests ordered: none   Ellijah Leffel Kassel S. Perlie Gold  [facility] [address] [place], [place] [address] [phone] Cell (Monday-Friday 8 AM - 5 PM) [phone] After 5 PM and follow prompts

## 2017-11-17 ENCOUNTER — Encounter: Payer: Self-pay | Admitting: Adult Health

## 2017-11-17 ENCOUNTER — Non-Acute Institutional Stay (SKILLED_NURSING_FACILITY): Payer: Medicare Other | Admitting: Adult Health

## 2017-11-17 DIAGNOSIS — E1129 Type 2 diabetes mellitus with other diabetic kidney complication: Secondary | ICD-10-CM

## 2017-11-17 DIAGNOSIS — E1159 Type 2 diabetes mellitus with other circulatory complications: Secondary | ICD-10-CM | POA: Diagnosis not present

## 2017-11-17 DIAGNOSIS — E1169 Type 2 diabetes mellitus with other specified complication: Secondary | ICD-10-CM

## 2017-11-17 DIAGNOSIS — I10 Essential (primary) hypertension: Secondary | ICD-10-CM | POA: Diagnosis not present

## 2017-11-17 DIAGNOSIS — E785 Hyperlipidemia, unspecified: Secondary | ICD-10-CM | POA: Diagnosis not present

## 2017-11-17 DIAGNOSIS — E1165 Type 2 diabetes mellitus with hyperglycemia: Secondary | ICD-10-CM

## 2017-11-17 DIAGNOSIS — IMO0002 Reserved for concepts with insufficient information to code with codable children: Secondary | ICD-10-CM

## 2017-11-17 NOTE — Progress Notes (Signed)
Location:   [facility] Room Number: 103 A Place of Service:  SNF (31)   CODE STATUS: DNR  Allergies  Allergen Reactions  . Sulfamethoxazole     REACTION: rash    Chief Complaint  Patient presents with  . Acute Visit    Care Planb Meeting    HPI:  We have come together for his routine care plan meeting. He is unable to fully participate in the meeting; he does have family present. His cbg readings are elevated. He denies any uncontrolled pain; no anxiety; no insomnia. We have reviewed his care plan. He has had a fall without injury. He continues to followed for his chronic illnesses including: hypertension ;cva; diabetes.   Past Medical History:  Diagnosis Date  . CEREBROVASCULAR ACCIDENT, HX OF 09/28/2006   2007 affected left side and peripheral vision  . DIABETES MELLITUS, TYPE II 09/28/2006  . GERD (gastroesophageal reflux disease)   . HYPERLIPIDEMIA 09/28/2006  . HYPERTENSION 09/28/2006  . Learning disability   . Stroke [facility]) years ago    Past Surgical History:  Procedure Laterality Date  . COLONOSCOPY    . LYMPHADENECTOMY    . RHINOPLASTY    . SIGMOIDOSCOPY      Social History   Socioeconomic History  . Marital status: Divorced    Spouse name: Not on file  . Number of children: Not on file  . Years of education: Not on file  . Highest education level: Not on file  Occupational History  . Not on file  Social Needs  . Financial resource strain: Not on file  . Food insecurity:    Worry: Not on file    Inability: Not on file  . Transportation needs:    Medical: Not on file    Non-medical: Not on file  Tobacco Use  . Smoking status: Current Every Day Smoker    Packs/day: 1.00    Types: Cigarettes  . Smokeless tobacco: Former Network engineer and Sexual Activity  . Alcohol use: No    Alcohol/week: 0.0 oz  . Drug use: No  . Sexual activity: Not on file  Lifestyle  . Physical activity:    Days per week: Not on file    Minutes per  session: Not on file  . Stress: Not on file  Relationships  . Social connections:    Talks on phone: Not on file    Gets together: Not on file    Attends religious service: Not on file    Active member of club or organization: Not on file    Attends meetings of clubs or organizations: Not on file    Relationship status: Not on file  . Intimate partner violence:    Fear of current or ex partner: Not on file    Emotionally abused: Not on file    Physically abused: Not on file    Forced sexual activity: Not on file  Other Topics Concern  . Not on file  Social History Narrative   Single. Lives alone will be living in a trailer near family friends Otho Perl and husband Renee Rival- will be staying in their trailer). They watch out for him.    Sister is also involved heavily.       Disabled after CVA, low literacy at baseline      Enjoys hunting   Family History  Problem Relation Age of Onset  . Lung cancer Mother   . Diabetes Father   . Stroke Father   .  Alcohol abuse Brother   . Diabetes Brother   . Heart attack Sister   . Colon cancer Neg Hx       VITAL SIGNS BP 125/65   Pulse 65   Temp 98 F (36.7 C)   Resp 18   Ht 5\' 8"  (1.727 m)   Wt 168 lb 3.2 oz (76.3 kg)   SpO2 99%   BMI 25.57 kg/m   Outpatient Encounter Medications as of 11/17/2017  Medication Sig  . acetaminophen (TYLENOL) 325 MG tablet Take 2 tablets (650 mg total) by mouth every 6 (six) hours as needed for mild pain (or Fever >/= 101).  [name] amLODipine (NORVASC) 10 MG tablet Take 10 mg by mouth daily.   [name] aspirin 325 MG tablet Take 1 tablet (325 mg total) by mouth daily.  [name] atorvastatin (LIPITOR) 20 MG tablet TAKE 1 TABLET BY MOUTH  DAILY  . buPROPion (WELLBUTRIN XL) 150 MG 24 hr tablet Take 1 tablet (150 mg total) by mouth daily.  [name] escitalopram (LEXAPRO) 5 MG tablet Take 1 tablet (5 mg total) by mouth daily.  . ferrous sulfate 325 (65 FE) MG tablet Take 1 tablet (325 mg total) by mouth 2 (two) times  daily with a meal.  . insulin aspart (NOVOLOG PENFILL) cartridge Inject 13 Units into the skin 3 (three) times daily before meals. For CBG equal to or greater than 150   . Insulin Glargine (LANTUS SOLOSTAR) 100 UNIT/ML Solostar Pen Inject 55 Units into the skin at bedtime.   [name] labetalol (NORMODYNE) 200 MG tablet Take 200 mg by mouth 2 (two) times daily.  [name] linagliptin (TRADJENTA) 5 MG TABS tablet Take 5 mg by mouth daily.  [name] losartan-hydrochlorothiazide (HYZAAR) 100-25 MG tablet Take 1 tablet by mouth daily.  [name] omeprazole (PRILOSEC) 20 MG capsule TAKE 1 CAPSULE BY MOUTH  DAILY   No facility-administered encounter medications on file as of 11/17/2017.      SIGNIFICANT DIAGNOSTIC EXAMS  PREVIOUS:   08-19-17: MRI of thoracic and lumbar spine: 1. Abnormal upper thoracic spinal cord signal affecting a 4.5 cm segment from T2-T3 to T4-T5. Central cord involvement with borderline to mild cord expansion. No enhancement. No other spinal cord signal abnormality, despite multilevel degenerative spinal stenosis elsewhere (see #2). In light of the patient history and Brain MRI findings today a subacute or chronic spinal cord infarct seems most likely. 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 The lack of enhancement argues against an acute inflammatory process of the cord. 2. Superimposed multilevel degenerative spinal stenosis, exacerbated in the thoracic spine by epidural lipomatosis. Predominantly mild degenerative cord mass effect occurs, but at T5-T6 there is moderate to severe stenosis with moderate cord mass effect, but no cord signal abnormality at that level. 3. Moderate degenerative lumbar spinal and lateral recess stenosis at L4-L5. 4.  No acute osseous abnormality identified in the spine.  08-19-17: mri of brain: 1. See abnormal  Thoracic Spine MRI findings today reported separately. 2. Very advanced chronic small vessel disease affecting the deep white matter, deep gray matter, and brainstem with two superimposed subacute appearing lacunar infarcts. No hemorrhage or mass effect.  08-19-17: carotid doppler: Right Carotid: Velocities in the right ICA are consistent with a 1-39% stenosis. Left Carotid: Velocities in the left ICA are consistent with a 1-39% stenosis. Vertebrals:  Bilateral vertebral arteries demonstrate antegrade  flow. Subclavians: Normal flow hemodynamics were seen in bilateral subclavian arteries.   08-20-17: 2- echo: - Left ventricle: Septal apical and inferior wall hypokinesis regionality suggests CAD. The cavity size was severely dilated. Wall thickness was increased in a pattern of severe LVH. Systolic function was moderately to severely reduced. The estimated ejection fraction was in the range of 30% to 35%. Doppler parameters are consistent with both elevated ventricular end-diastolic filling pressure and elevated left atrial filling pressure. - Atrial septum: No defect or patent foramen ovale was identified.  NO NEW EXAM   LABS REVIEWED PREVIOUS:   08-18-17: wbc 13.1; hgb 15.3;hct43.8; mcv 92.8;  plt 238; glucose 86; bun 30; creat 1.71; k+ 3.9; na++ 140; ca 9.4 08-19-17: wbc 11.8; hgb 13.4; hct 39.3; mcv 92.9 plt 196; glucose 72; bun 32; creat 1.86; k+ 3.5; na++ 141; ca 8.7; tsh 4.327; vit B 12: 517; folate 15.0; hgb a1c 9.2; copper 112; HIV/RPR: nr 08-20-17: chol 120; ldl 60 trig 181; hdl 24 08-23-17: glucose 167; bun 18; creat 1.56; k+ 3.9; na++ 138; ca 8.8  NO NEW LABS.      Review of Systems  Constitutional: Negative for malaise/fatigue.  Respiratory: Negative for cough and shortness of breath.   Cardiovascular: Negative for chest pain, palpitations and leg swelling.  Gastrointestinal: Negative for abdominal pain, constipation and heartburn.  Musculoskeletal: Negative for back pain, joint pain and  myalgias.  Skin: Negative.   Neurological: Negative for dizziness.  Psychiatric/Behavioral: The patient is not nervous/anxious.     Physical Exam  Constitutional: He is oriented to person, place, and time. He appears well-developed and well-nourished. No distress.  Neck: No thyromegaly present.  Cardiovascular: Normal rate, regular rhythm and intact distal pulses.  Murmur heard. 1/6  Pulmonary/Chest: Effort normal and breath sounds normal. No respiratory distress.  Abdominal: Soft. Bowel sounds are normal. He exhibits no distension. There is no tenderness.  Musculoskeletal: Normal range of motion. He exhibits no edema.  Lymphadenopathy:    He has no cervical adenopathy.  Neurological: He is alert and oriented to person, place, and time.  Skin: Skin is warm and dry. He is not diaphoretic.  Psychiatric: He has a normal mood and affect.    ASSESSMENT/ PLAN:  TODAY:   1.  Dyslipidemia associated with type 2 diabetes:  2. Hypertension associated with diabetes:  3.  Type 2 diabetes mellitus uncontrolled with renal manifestations:   Will continue his current plan of care Will increase lantus to 60 units nightly  Will increase novolog to 18 units with meals  Time spent with patient 35 minutes: discussed plan of care; medical status; medications and diabetes: verbalized understanding.   MD is aware of resident's narcotic use and is in agreement with current plan of care. We will attempt to wean resident as apropriate   Ok Edwards NP [facility]  Contact [phone] Monday through Friday 8am- 5pm  After hours call [phone]

## 2017-11-20 DIAGNOSIS — S40021A Contusion of right upper arm, initial encounter: Secondary | ICD-10-CM | POA: Diagnosis not present

## 2017-12-02 ENCOUNTER — Non-Acute Institutional Stay (SKILLED_NURSING_FACILITY): Payer: Medicare Other | Admitting: Adult Health

## 2017-12-02 ENCOUNTER — Encounter: Payer: Self-pay | Admitting: Adult Health

## 2017-12-02 DIAGNOSIS — N183 Chronic kidney disease, stage 3 (moderate): Secondary | ICD-10-CM

## 2017-12-02 DIAGNOSIS — E1129 Type 2 diabetes mellitus with other diabetic kidney complication: Secondary | ICD-10-CM

## 2017-12-02 DIAGNOSIS — E1122 Type 2 diabetes mellitus with diabetic chronic kidney disease: Secondary | ICD-10-CM

## 2017-12-02 DIAGNOSIS — I10 Essential (primary) hypertension: Secondary | ICD-10-CM

## 2017-12-02 DIAGNOSIS — E1159 Type 2 diabetes mellitus with other circulatory complications: Secondary | ICD-10-CM

## 2017-12-02 DIAGNOSIS — D638 Anemia in other chronic diseases classified elsewhere: Secondary | ICD-10-CM | POA: Diagnosis not present

## 2017-12-02 DIAGNOSIS — IMO0002 Reserved for concepts with insufficient information to code with codable children: Secondary | ICD-10-CM

## 2017-12-02 DIAGNOSIS — E1165 Type 2 diabetes mellitus with hyperglycemia: Secondary | ICD-10-CM

## 2017-12-02 NOTE — Progress Notes (Signed)
Location:   [facility] Room Number: 103 A Place of Service:  SNF (31)   CODE STATUS: DNR  Allergies  Allergen Reactions  . Sulfamethoxazole     REACTION: rash    Chief Complaint  Patient presents with  . Medical Management of Chronic Issues    Hypertension; ckd stage 3; diabetes; anemia     HPI:  He is a 57 year old long term resident of this facility being seen for the management of his chronic illnesses: hypertension; ckd stage 3; diabetes anemia. He denies any excessive thirst or hunger; no headaches or chest pain. There are no nursing concerns at this time.   Past Medical History:  Diagnosis Date  . CEREBROVASCULAR ACCIDENT, HX OF 09/28/2006   2007 affected left side and peripheral vision  . DIABETES MELLITUS, TYPE II 09/28/2006  . GERD (gastroesophageal reflux disease)   . HYPERLIPIDEMIA 09/28/2006  . HYPERTENSION 09/28/2006  . Learning disability   . Stroke [facility]) years ago    Past Surgical History:  Procedure Laterality Date  . COLONOSCOPY    . LYMPHADENECTOMY    . RHINOPLASTY    . SIGMOIDOSCOPY      Social History   Socioeconomic History  . Marital status: Divorced    Spouse name: Not on file  . Number of children: Not on file  . Years of education: Not on file  . Highest education level: Not on file  Occupational History  . Not on file  Social Needs  . Financial resource strain: Not on file  . Food insecurity:    Worry: Not on file    Inability: Not on file  . Transportation needs:    Medical: Not on file    Non-medical: Not on file  Tobacco Use  . Smoking status: Current Every Day Smoker    Packs/day: 1.00    Types: Cigarettes  . Smokeless tobacco: Former Network engineer and Sexual Activity  . Alcohol use: No    Alcohol/week: 0.0 standard drinks  . Drug use: No  . Sexual activity: Not on file  Lifestyle  . Physical activity:    Days per week: Not on file    Minutes per session: Not on file  . Stress: Not on file    Relationships  . Social connections:    Talks on phone: Not on file    Gets together: Not on file    Attends religious service: Not on file    Active member of club or organization: Not on file    Attends meetings of clubs or organizations: Not on file    Relationship status: Not on file  . Intimate partner violence:    Fear of current or ex partner: Not on file    Emotionally abused: Not on file    Physically abused: Not on file    Forced sexual activity: Not on file  Other Topics Concern  . Not on file  Social History Narrative   Single. Lives alone will be living in a trailer near family friends Otho Perl and husband Renee Rival- will be staying in their trailer). They watch out for him.    Sister is also involved heavily.       Disabled after CVA, low literacy at baseline      Enjoys hunting   Family History  Problem Relation Age of Onset  . Lung cancer Mother   . Diabetes Father   . Stroke Father   . Alcohol abuse Brother   .  Diabetes Brother   . Heart attack Sister   . Colon cancer Neg Hx       VITAL SIGNS BP 132/74   Pulse 88   Temp 98 F (36.7 C)   Resp 18   Ht 5\' 8"  (1.727 m)   Wt 174 lb (78.9 kg)   SpO2 99%   BMI 26.46 kg/m   Outpatient Encounter Medications as of 12/02/2017  Medication Sig  . acetaminophen (TYLENOL) 325 MG tablet Take 2 tablets (650 mg total) by mouth every 6 (six) hours as needed for mild pain (or Fever >/= 101).  [name] amLODipine (NORVASC) 10 MG tablet Take 10 mg by mouth daily.   [name] aspirin 325 MG tablet Take 1 tablet (325 mg total) by mouth daily.  [name] atorvastatin (LIPITOR) 20 MG tablet TAKE 1 TABLET BY MOUTH  DAILY  . buPROPion (WELLBUTRIN XL) 150 MG 24 hr tablet Take 1 tablet (150 mg total) by mouth daily.  [name] escitalopram (LEXAPRO) 5 MG tablet Take 1 tablet (5 mg total) by mouth daily.  . ferrous sulfate 325 (65 FE) MG tablet Take 1 tablet (325 mg total) by mouth 2 (two) times daily with a meal.  . insulin aspart (NOVOLOG  PENFILL) cartridge Inject 18 Units into the skin 3 (three) times daily before meals. For CBG equal to or greater than 150   . Insulin Glargine (LANTUS SOLOSTAR) 100 UNIT/ML Solostar Pen Inject 60 Units into the skin at bedtime.   [name] labetalol (NORMODYNE) 200 MG tablet Take 200 mg by mouth 2 (two) times daily.  [name] linagliptin (TRADJENTA) 5 MG TABS tablet Take 5 mg by mouth daily.  [name] losartan-hydrochlorothiazide (HYZAAR) 100-25 MG tablet Take 1 tablet by mouth daily.  [name] omeprazole (PRILOSEC) 20 MG capsule TAKE 1 CAPSULE BY MOUTH  DAILY   No facility-administered encounter medications on file as of 12/02/2017.      SIGNIFICANT DIAGNOSTIC EXAMS  PREVIOUS:   08-19-17: MRI of thoracic and lumbar spine: 1. Abnormal upper thoracic spinal cord signal affecting a 4.5 cm segment from T2-T3 to T4-T5. Central cord involvement with borderline to mild cord expansion. No enhancement. No other spinal cord signal abnormality, despite multilevel degenerative spinal stenosis elsewhere (see #2). In light of the patient history and Brain MRI findings today a subacute or chronic spinal cord infarct seems most likely. 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 The lack of enhancement argues against an acute inflammatory process of the cord. 2. Superimposed multilevel degenerative spinal stenosis, exacerbated in the thoracic spine by epidural lipomatosis. Predominantly mild degenerative cord mass effect occurs, but at T5-T6 there is moderate to severe stenosis with moderate cord mass effect, but no cord signal abnormality at that level. 3. Moderate degenerative lumbar spinal and lateral recess stenosis at L4-L5. 4.  No acute osseous abnormality identified in the spine.  08-19-17: mri of brain: 1. See abnormal Thoracic Spine MRI findings today reported  separately. 2. Very advanced chronic small vessel disease affecting the deep white matter, deep gray matter, and brainstem with two superimposed subacute appearing lacunar infarcts. No hemorrhage or mass effect.  08-19-17: carotid doppler: Right Carotid: Velocities in the right ICA are consistent with a 1-39% stenosis. Left Carotid: Velocities in the left ICA are consistent with a 1-39% stenosis. Vertebrals:  Bilateral vertebral arteries demonstrate antegrade flow. Subclavians: Normal flow hemodynamics were seen in  bilateral subclavian arteries.   08-20-17: 2- echo: - Left ventricle: Septal apical and inferior wall hypokinesis regionality suggests CAD. The cavity size was severely dilated. Wall thickness was increased in a pattern of severe LVH. Systolic function was moderately to severely reduced. The estimated ejection fraction was in the range of 30% to 35%. Doppler parameters are consistent with both elevated ventricular end-diastolic filling pressure and elevated left atrial filling pressure. - Atrial septum: No defect or patent foramen ovale was identified.  NO NEW EXAM   LABS REVIEWED PREVIOUS:   08-18-17: wbc 13.1; hgb 15.3;hct43.8; mcv 92.8;  plt 238; glucose 86; bun 30; creat 1.71; k+ 3.9; na++ 140; ca 9.4 08-19-17: wbc 11.8; hgb 13.4; hct 39.3; mcv 92.9 plt 196; glucose 72; bun 32; creat 1.86; k+ 3.5; na++ 141; ca 8.7; tsh 4.327; vit B 12: 517; folate 15.0; hgb a1c 9.2; copper 112; HIV/RPR: nr 08-20-17: chol 120; ldl 60 trig 181; hdl 24 08-23-17: glucose 167; bun 18; creat 1.56; k+ 3.9; na++ 138; ca 8.8  NO NEW LABS.     Review of Systems  Constitutional: Negative for malaise/fatigue.  Respiratory: Negative for cough and shortness of breath.   Cardiovascular: Negative for chest pain, palpitations and leg swelling.  Gastrointestinal: Negative for abdominal pain, constipation and heartburn.  Musculoskeletal: Negative for back pain, joint pain and myalgias.  Skin: Negative.   Neurological:  Negative for dizziness.  Psychiatric/Behavioral: The patient is not nervous/anxious.      Physical Exam  Constitutional: He is oriented to person, place, and time. He appears well-developed and well-nourished. No distress.  Neck: No thyromegaly present.  Cardiovascular: Normal rate, regular rhythm and intact distal pulses.  Murmur heard. Pulmonary/Chest: Effort normal and breath sounds normal. No respiratory distress.  Abdominal: Soft. Bowel sounds are normal. He exhibits no distension. There is no tenderness.  Musculoskeletal: Normal range of motion. He exhibits no edema.  Lymphadenopathy:    He has no cervical adenopathy.  Neurological: He is alert and oriented to person, place, and time.  Skin: Skin is warm and dry. He is not diaphoretic.  Psychiatric: He has a normal mood and affect.    ASSESSMENT/ PLAN:  TODAY:   1. Hypertension associated with diabetes: is stable b/p 132/74; will continue labetolol 200 mg twice daily hyzaar 100/25 mg daily and norvasc  10 mg daily   2.  Type 2 diabetes mellitus uncontrolled with renal manifestations: without change hgb a1c 9.2 (prevoius9.0);  Is on arb; statin; asa  Will continue  lantus 60 units nightly and novolog 18 units after meals and tradjenta 5 mg daily   3. Stage 3 chronic renal impairment associated with type 2 diabetes mellitus: stable bun 18; creat 1.56; will monitor  4. Anemia of chronic disease: stable hgb 13.4; will lower iron to one time daily   PREVIOUS  5. Depression major; single episode mild: is stable will continue lexapro 5 mg daily and wellbutrin xl 150 mg daily   6. Gerd without esophagitis: stable will continue prilosec 20 mg daily   7.  CVA has hemiparesis affecting left side as late effect of cerebrovascular accident: is stable will continue asa 325 mg daily   8.  Dyslipidemia associated with type 2 diabetes: stable ldl 60 will continue lipitor 20 mg daily    MD is aware of resident's narcotic use and is in  agreement with current plan of care. We will attempt to wean resident as apropriate   Ok Edwards NP [facility]  Contact  [phone] Monday through Friday 8am- 5pm  After hours call [phone]

## 2017-12-10 DIAGNOSIS — R1312 Dysphagia, oropharyngeal phase: Secondary | ICD-10-CM | POA: Diagnosis not present

## 2017-12-10 DIAGNOSIS — K219 Gastro-esophageal reflux disease without esophagitis: Secondary | ICD-10-CM | POA: Diagnosis not present

## 2017-12-10 DIAGNOSIS — I639 Cerebral infarction, unspecified: Secondary | ICD-10-CM | POA: Diagnosis not present

## 2017-12-13 DIAGNOSIS — K219 Gastro-esophageal reflux disease without esophagitis: Secondary | ICD-10-CM | POA: Diagnosis not present

## 2017-12-13 DIAGNOSIS — R1312 Dysphagia, oropharyngeal phase: Secondary | ICD-10-CM | POA: Diagnosis not present

## 2017-12-13 DIAGNOSIS — I639 Cerebral infarction, unspecified: Secondary | ICD-10-CM | POA: Diagnosis not present

## 2017-12-14 DIAGNOSIS — K219 Gastro-esophageal reflux disease without esophagitis: Secondary | ICD-10-CM | POA: Diagnosis not present

## 2017-12-14 DIAGNOSIS — R1312 Dysphagia, oropharyngeal phase: Secondary | ICD-10-CM | POA: Diagnosis not present

## 2017-12-14 DIAGNOSIS — I639 Cerebral infarction, unspecified: Secondary | ICD-10-CM | POA: Diagnosis not present

## 2017-12-15 DIAGNOSIS — K219 Gastro-esophageal reflux disease without esophagitis: Secondary | ICD-10-CM | POA: Diagnosis not present

## 2017-12-15 DIAGNOSIS — R1312 Dysphagia, oropharyngeal phase: Secondary | ICD-10-CM | POA: Diagnosis not present

## 2017-12-15 DIAGNOSIS — I639 Cerebral infarction, unspecified: Secondary | ICD-10-CM | POA: Diagnosis not present

## 2017-12-16 DIAGNOSIS — R1312 Dysphagia, oropharyngeal phase: Secondary | ICD-10-CM | POA: Diagnosis not present

## 2017-12-16 DIAGNOSIS — I739 Peripheral vascular disease, unspecified: Secondary | ICD-10-CM | POA: Diagnosis not present

## 2017-12-16 DIAGNOSIS — B351 Tinea unguium: Secondary | ICD-10-CM | POA: Diagnosis not present

## 2017-12-16 DIAGNOSIS — I639 Cerebral infarction, unspecified: Secondary | ICD-10-CM | POA: Diagnosis not present

## 2017-12-16 DIAGNOSIS — K219 Gastro-esophageal reflux disease without esophagitis: Secondary | ICD-10-CM | POA: Diagnosis not present

## 2017-12-19 DIAGNOSIS — I639 Cerebral infarction, unspecified: Secondary | ICD-10-CM | POA: Diagnosis not present

## 2017-12-19 DIAGNOSIS — R1312 Dysphagia, oropharyngeal phase: Secondary | ICD-10-CM | POA: Diagnosis not present

## 2017-12-19 DIAGNOSIS — K219 Gastro-esophageal reflux disease without esophagitis: Secondary | ICD-10-CM | POA: Diagnosis not present

## 2017-12-20 DIAGNOSIS — K219 Gastro-esophageal reflux disease without esophagitis: Secondary | ICD-10-CM | POA: Diagnosis not present

## 2017-12-20 DIAGNOSIS — I639 Cerebral infarction, unspecified: Secondary | ICD-10-CM | POA: Diagnosis not present

## 2017-12-20 DIAGNOSIS — R1312 Dysphagia, oropharyngeal phase: Secondary | ICD-10-CM | POA: Diagnosis not present

## 2017-12-21 DIAGNOSIS — K219 Gastro-esophageal reflux disease without esophagitis: Secondary | ICD-10-CM | POA: Diagnosis not present

## 2017-12-21 DIAGNOSIS — R1312 Dysphagia, oropharyngeal phase: Secondary | ICD-10-CM | POA: Diagnosis not present

## 2017-12-21 DIAGNOSIS — I639 Cerebral infarction, unspecified: Secondary | ICD-10-CM | POA: Diagnosis not present

## 2017-12-22 DIAGNOSIS — N390 Urinary tract infection, site not specified: Secondary | ICD-10-CM | POA: Diagnosis not present

## 2017-12-22 DIAGNOSIS — R1312 Dysphagia, oropharyngeal phase: Secondary | ICD-10-CM | POA: Diagnosis not present

## 2017-12-22 DIAGNOSIS — K219 Gastro-esophageal reflux disease without esophagitis: Secondary | ICD-10-CM | POA: Diagnosis not present

## 2017-12-22 DIAGNOSIS — Z79899 Other long term (current) drug therapy: Secondary | ICD-10-CM | POA: Diagnosis not present

## 2017-12-22 DIAGNOSIS — R319 Hematuria, unspecified: Secondary | ICD-10-CM | POA: Diagnosis not present

## 2017-12-22 DIAGNOSIS — I639 Cerebral infarction, unspecified: Secondary | ICD-10-CM | POA: Diagnosis not present

## 2017-12-23 DIAGNOSIS — R1312 Dysphagia, oropharyngeal phase: Secondary | ICD-10-CM | POA: Diagnosis not present

## 2017-12-23 DIAGNOSIS — K219 Gastro-esophageal reflux disease without esophagitis: Secondary | ICD-10-CM | POA: Diagnosis not present

## 2017-12-23 DIAGNOSIS — I639 Cerebral infarction, unspecified: Secondary | ICD-10-CM | POA: Diagnosis not present

## 2017-12-24 ENCOUNTER — Other Ambulatory Visit: Payer: Self-pay

## 2017-12-24 DIAGNOSIS — R1312 Dysphagia, oropharyngeal phase: Secondary | ICD-10-CM | POA: Diagnosis not present

## 2017-12-24 DIAGNOSIS — K219 Gastro-esophageal reflux disease without esophagitis: Secondary | ICD-10-CM | POA: Diagnosis not present

## 2017-12-24 DIAGNOSIS — I639 Cerebral infarction, unspecified: Secondary | ICD-10-CM | POA: Diagnosis not present

## 2017-12-24 NOTE — Patient Outreach (Signed)
[facility]) Care Management  12/24/2017  MYLAN SCHWARZ 22-Oct-1960 [id number]   Medication Adherence call to Mr : William [name] patient did not answer patient is due on Atorvastatin 20 mg and Losartan/ Hctz 100/25 mg . Mr. Peschke is showing past due under [facility].  [place] [facility] Direct Dial [phone]  Fax [phone] [email or web address]

## 2017-12-27 DIAGNOSIS — K219 Gastro-esophageal reflux disease without esophagitis: Secondary | ICD-10-CM | POA: Diagnosis not present

## 2017-12-27 DIAGNOSIS — R1312 Dysphagia, oropharyngeal phase: Secondary | ICD-10-CM | POA: Diagnosis not present

## 2017-12-27 DIAGNOSIS — I639 Cerebral infarction, unspecified: Secondary | ICD-10-CM | POA: Diagnosis not present

## 2017-12-28 DIAGNOSIS — K219 Gastro-esophageal reflux disease without esophagitis: Secondary | ICD-10-CM | POA: Diagnosis not present

## 2017-12-28 DIAGNOSIS — I639 Cerebral infarction, unspecified: Secondary | ICD-10-CM | POA: Diagnosis not present

## 2017-12-28 DIAGNOSIS — R1312 Dysphagia, oropharyngeal phase: Secondary | ICD-10-CM | POA: Diagnosis not present

## 2017-12-29 DIAGNOSIS — R1312 Dysphagia, oropharyngeal phase: Secondary | ICD-10-CM | POA: Diagnosis not present

## 2017-12-29 DIAGNOSIS — I639 Cerebral infarction, unspecified: Secondary | ICD-10-CM | POA: Diagnosis not present

## 2017-12-29 DIAGNOSIS — K219 Gastro-esophageal reflux disease without esophagitis: Secondary | ICD-10-CM | POA: Diagnosis not present

## 2017-12-30 DIAGNOSIS — R1312 Dysphagia, oropharyngeal phase: Secondary | ICD-10-CM | POA: Diagnosis not present

## 2017-12-30 DIAGNOSIS — K219 Gastro-esophageal reflux disease without esophagitis: Secondary | ICD-10-CM | POA: Diagnosis not present

## 2017-12-30 DIAGNOSIS — I639 Cerebral infarction, unspecified: Secondary | ICD-10-CM | POA: Diagnosis not present

## 2018-01-02 DIAGNOSIS — I639 Cerebral infarction, unspecified: Secondary | ICD-10-CM | POA: Diagnosis not present

## 2018-01-02 DIAGNOSIS — R1312 Dysphagia, oropharyngeal phase: Secondary | ICD-10-CM | POA: Diagnosis not present

## 2018-01-02 DIAGNOSIS — K219 Gastro-esophageal reflux disease without esophagitis: Secondary | ICD-10-CM | POA: Diagnosis not present

## 2018-01-03 DIAGNOSIS — R1312 Dysphagia, oropharyngeal phase: Secondary | ICD-10-CM | POA: Diagnosis not present

## 2018-01-03 DIAGNOSIS — I639 Cerebral infarction, unspecified: Secondary | ICD-10-CM | POA: Diagnosis not present

## 2018-01-03 DIAGNOSIS — K219 Gastro-esophageal reflux disease without esophagitis: Secondary | ICD-10-CM | POA: Diagnosis not present

## 2018-01-04 DIAGNOSIS — R1312 Dysphagia, oropharyngeal phase: Secondary | ICD-10-CM | POA: Diagnosis not present

## 2018-01-04 DIAGNOSIS — K219 Gastro-esophageal reflux disease without esophagitis: Secondary | ICD-10-CM | POA: Diagnosis not present

## 2018-01-04 DIAGNOSIS — I639 Cerebral infarction, unspecified: Secondary | ICD-10-CM | POA: Diagnosis not present

## 2018-01-05 DIAGNOSIS — I639 Cerebral infarction, unspecified: Secondary | ICD-10-CM | POA: Diagnosis not present

## 2018-01-05 DIAGNOSIS — R1312 Dysphagia, oropharyngeal phase: Secondary | ICD-10-CM | POA: Diagnosis not present

## 2018-01-05 DIAGNOSIS — K219 Gastro-esophageal reflux disease without esophagitis: Secondary | ICD-10-CM | POA: Diagnosis not present

## 2018-01-06 ENCOUNTER — Non-Acute Institutional Stay (SKILLED_NURSING_FACILITY): Payer: Medicare Other

## 2018-01-06 DIAGNOSIS — R1312 Dysphagia, oropharyngeal phase: Secondary | ICD-10-CM | POA: Diagnosis not present

## 2018-01-06 DIAGNOSIS — K219 Gastro-esophageal reflux disease without esophagitis: Secondary | ICD-10-CM | POA: Diagnosis not present

## 2018-01-06 DIAGNOSIS — Z0000 Encounter for general adult medical examination without abnormal findings: Secondary | ICD-10-CM | POA: Diagnosis not present

## 2018-01-06 DIAGNOSIS — I639 Cerebral infarction, unspecified: Secondary | ICD-10-CM | POA: Diagnosis not present

## 2018-01-06 NOTE — Patient Instructions (Addendum)
William [name] , Thank you for taking time to come for your Medicare Wellness Visit. I appreciate your ongoing commitment to your health goals. Please review the following plan we discussed and let me know if I can assist you in the future.   Screening recommendations/referrals: Colonoscopy up to date Recommended yearly ophthalmology/optometry visit for glaucoma screening and checkup Recommended yearly dental visit for hygiene and checkup  Vaccinations: Influenza vaccine due, will receive at [facility] Pneumococcal vaccine due at age 29 Tdap vaccine up to date Shingles vaccine not in past records    Advanced directives: in chart  Conditions/risks identified: none  Next appointment: Dr. Eulas Post makes rounds  Preventive Care 40-64 Years, Male Preventive care refers to lifestyle choices and visits with your health care provider that can promote health and wellness. What does preventive care include?  A yearly physical exam. This is also called an annual well check.  Dental exams once or twice a year.  Routine eye exams. Ask your health care provider how often you should have your eyes checked.  Personal lifestyle choices, including:  Daily care of your teeth and gums.  Regular physical activity.  Eating a healthy diet.  Avoiding tobacco and drug use.  Limiting alcohol use.  Practicing safe sex.  Taking low-dose aspirin every day starting at age 35. What happens during an annual well check? The services and screenings done by your health care provider during your annual well check will depend on your age, overall health, lifestyle risk factors, and family history of disease. Counseling  Your health care provider may ask you questions about your:  Alcohol use.  Tobacco use.  Drug use.  Emotional well-being.  Home and relationship well-being.  Sexual activity.  Eating habits.  Work and work Statistician. Screening  You may have the following tests or  measurements:  Height, weight, and BMI.  Blood pressure.  Lipid and cholesterol levels. These may be checked every 5 years, or more frequently if you are over 17 years old.  Skin check.  Lung cancer screening. You may have this screening every year starting at age 72 if you have a 30-pack-year history of smoking and currently smoke or have quit within the past 15 years.  Fecal occult blood test (FOBT) of the stool. You may have this test every year starting at age 44.  Flexible sigmoidoscopy or colonoscopy. You may have a sigmoidoscopy every 5 years or a colonoscopy every 10 years starting at age 54.  Prostate cancer screening. Recommendations will vary depending on your family history and other risks.  Hepatitis C blood test.  Hepatitis B blood test.  Sexually transmitted disease (STD) testing.  Diabetes screening. This is done by checking your blood sugar (glucose) after you have not eaten for a while (fasting). You may have this done every 1-3 years. Discuss your test results, treatment options, and if necessary, the need for more tests with your health care provider. Vaccines  Your health care provider may recommend certain vaccines, such as:  Influenza vaccine. This is recommended every year.  Tetanus, diphtheria, and acellular pertussis (Tdap, Td) vaccine. You may need a Td booster every 10 years.  Zoster vaccine. You may need this after age 61.  Pneumococcal 13-valent conjugate (PCV13) vaccine. You may need this if you have certain conditions and have not been vaccinated.  Pneumococcal polysaccharide (PPSV23) vaccine. You may need one or two doses if you smoke cigarettes or if you have certain conditions. Talk to your health care provider  about which screenings and vaccines you need and how often you need them. This information is not intended to replace advice given to you by your health care provider. Make sure you discuss any questions you have with your health care  provider. Document Released: 05/03/2015 Document Revised: 12/25/2015 Document Reviewed: 02/05/2015 Elsevier Interactive Patient Education  2017 [place] Prevention in the Home Falls can cause injuries. They can happen to people of all ages. There are many things you can do to make your home safe and to help prevent falls. What can I do on the outside of my home?  Regularly fix the edges of walkways and driveways and fix any cracks.  Remove anything that might make you trip as you walk through a door, such as a raised step or threshold.  Trim any bushes or trees on the path to your home.  Use bright outdoor lighting.  Clear any walking paths of anything that might make someone trip, such as rocks or tools.  Regularly check to see if handrails are loose or broken. Make sure that both sides of any steps have handrails.  Any raised decks and porches should have guardrails on the edges.  Have any leaves, snow, or ice cleared regularly.  Use sand or salt on walking paths during winter.  Clean up any spills in your garage right away. This includes oil or grease spills. What can I do in the bathroom?  Use night lights.  Install grab bars by the toilet and in the tub and shower. Do not use towel bars as grab bars.  Use non-skid mats or decals in the tub or shower.  If you need to sit down in the shower, use a plastic, non-slip stool.  Keep the floor dry. Clean up any water that spills on the floor as soon as it happens.  Remove soap buildup in the tub or shower regularly.  Attach bath mats securely with double-sided non-slip rug tape.  Do not have throw rugs and other things on the floor that can make you trip. What can I do in the bedroom?  Use night lights.  Make sure that you have a light by your bed that is easy to reach.  Do not use any sheets or blankets that are too big for your bed. They should not hang down onto the floor.  Have a firm chair that has  side arms. You can use this for support while you get dressed.  Do not have throw rugs and other things on the floor that can make you trip. What can I do in the kitchen?  Clean up any spills right away.  Avoid walking on wet floors.  Keep items that you use a lot in easy-to-reach places.  If you need to reach something above you, use a strong step stool that has a grab bar.  Keep electrical cords out of the way.  Do not use floor polish or wax that makes floors slippery. If you must use wax, use non-skid floor wax.  Do not have throw rugs and other things on the floor that can make you trip. What can I do with my stairs?  Do not leave any items on the stairs.  Make sure that there are handrails on both sides of the stairs and use them. Fix handrails that are broken or loose. Make sure that handrails are as long as the stairways.  Check any carpeting to make sure that it is firmly attached to  the stairs. Fix any carpet that is loose or worn.  Avoid having throw rugs at the top or bottom of the stairs. If you do have throw rugs, attach them to the floor with carpet tape.  Make sure that you have a light switch at the top of the stairs and the bottom of the stairs. If you do not have them, ask someone to add them for you. What else can I do to help prevent falls?  Wear shoes that:  Do not have high heels.  Have rubber bottoms.  Are comfortable and fit you well.  Are closed at the toe. Do not wear sandals.  If you use a stepladder:  Make sure that it is fully opened. Do not climb a closed stepladder.  Make sure that both sides of the stepladder are locked into place.  Ask someone to hold it for you, if possible.  Clearly mark and make sure that you can see:  Any grab bars or handrails.  First and last steps.  Where the edge of each step is.  Use tools that help you move around (mobility aids) if they are needed. These  include:  Canes.  Walkers.  Scooters.  Crutches.  Turn on the lights when you go into a dark area. Replace any light bulbs as soon as they burn out.  Set up your furniture so you have a clear path. Avoid moving your furniture around.  If any of your floors are uneven, fix them.  If there are any pets around you, be aware of where they are.  Review your medicines with your doctor. Some medicines can make you feel dizzy. This can increase your chance of falling. Ask your doctor what other things that you can do to help prevent falls. This information is not intended to replace advice given to you by your health care provider. Make sure you discuss any questions you have with your health care provider. Document Released: 01/31/2009 Document Revised: 09/12/2015 Document Reviewed: 05/11/2014 Elsevier Interactive Patient Education  2017 [employer].

## 2018-01-06 NOTE — Progress Notes (Signed)
Subjective:   William [name] is a 57 y.o. male who presents for an Initial Medicare Annual Wellness Visit at [facility]   Objective:    Today's Vitals   01/06/18 1156  BP: 126/75  Pulse: 80  Temp: 98 F (36.7 C)  TempSrc: Oral  Weight: 174 lb (78.9 kg)  Height: 5\' 8"  (1.727 m)   Body mass index is 26.46 kg/m.  Advanced Directives 01/06/2018 12/02/2017 11/17/2017 10/28/2017 10/22/2017 10/11/2017 09/22/2017  Does Patient Have a Medical Advance Directive? Yes Yes Yes Yes Yes Yes Yes  Type of Advance Directive Out of facility DNR (pink MOST or yellow form) Out of facility DNR (pink MOST or yellow form) Out of facility DNR (pink MOST or yellow form) Out of facility DNR (pink MOST or yellow form) Out of facility DNR (pink MOST or yellow form) Out of facility DNR (pink MOST or yellow form) Out of facility DNR (pink MOST or yellow form)  Does patient want to make changes to medical advance directive? No - Patient declined No - Patient declined No - Patient declined No - Patient declined No - Patient declined No - Patient declined No - Patient declined  Would patient like information on creating a medical advance directive? - - - - - - -  Pre-existing out of facility DNR order (yellow form or pink MOST form) Yellow form placed in chart (order not valid for inpatient use) Yellow form placed in chart (order not valid for inpatient use) Yellow form placed in chart (order not valid for inpatient use) Yellow form placed in chart (order not valid for inpatient use) Yellow form placed in chart (order not valid for inpatient use) Yellow form placed in chart (order not valid for inpatient use) Yellow form placed in chart (order not valid for inpatient use)    Current Medications (verified) Outpatient Encounter Medications as of 01/06/2018  Medication Sig  . acetaminophen (TYLENOL) 325 MG tablet Take 2 tablets (650 mg total) by mouth every 6 (six) hours as needed for mild pain (or Fever >/=  101).  [name] amLODipine (NORVASC) 10 MG tablet Take 10 mg by mouth daily.   [name] aspirin 325 MG tablet Take 1 tablet (325 mg total) by mouth daily.  [name] atorvastatin (LIPITOR) 20 MG tablet TAKE 1 TABLET BY MOUTH  DAILY  . buPROPion (WELLBUTRIN XL) 150 MG 24 hr tablet Take 1 tablet (150 mg total) by mouth daily.  [name] escitalopram (LEXAPRO) 5 MG tablet Take 1 tablet (5 mg total) by mouth daily.  . ferrous sulfate 325 (65 FE) MG tablet Take 1 tablet (325 mg total) by mouth 2 (two) times daily with a meal.  . insulin aspart (NOVOLOG PENFILL) cartridge Inject 18 Units into the skin 3 (three) times daily before meals. For CBG equal to or greater than 150   . Insulin Glargine (LANTUS SOLOSTAR) 100 UNIT/ML Solostar Pen Inject 60 Units into the skin at bedtime.   [name] labetalol (NORMODYNE) 200 MG tablet Take 200 mg by mouth 2 (two) times daily.  [name] linagliptin (TRADJENTA) 5 MG TABS tablet Take 5 mg by mouth daily.  [name] losartan-hydrochlorothiazide (HYZAAR) 100-25 MG tablet Take 1 tablet by mouth daily.  [name] omeprazole (PRILOSEC) 20 MG capsule TAKE 1 CAPSULE BY MOUTH  DAILY   No facility-administered encounter medications on file as of 01/06/2018.     Allergies (verified) Sulfamethoxazole   History: Past Medical History:  Diagnosis Date  . CEREBROVASCULAR ACCIDENT, HX OF 09/28/2006  2007 affected left side and peripheral vision  . DIABETES MELLITUS, TYPE II 09/28/2006  . GERD (gastroesophageal reflux disease)   . HYPERLIPIDEMIA 09/28/2006  . HYPERTENSION 09/28/2006  . Learning disability   . Stroke [facility]) years ago   Past Surgical History:  Procedure Laterality Date  . COLONOSCOPY    . LYMPHADENECTOMY    . RHINOPLASTY    . SIGMOIDOSCOPY     Family History  Problem Relation Age of Onset  . Lung cancer Mother   . Diabetes Father   . Stroke Father   . Alcohol abuse Brother   . Diabetes Brother   . Heart attack Sister   . Colon cancer Neg Hx    Social History   Socioeconomic History  . Marital status:  Divorced    Spouse name: Not on file  . Number of children: Not on file  . Years of education: Not on file  . Highest education level: Not on file  Occupational History  . Not on file  Social Needs  . Financial resource strain: Not hard at all  . Food insecurity:    Worry: Never true    Inability: Never true  . Transportation needs:    Medical: No    Non-medical: No  Tobacco Use  . Smoking status: Current Every Day Smoker    Packs/day: 1.00    Types: Cigarettes  . Smokeless tobacco: Former Network engineer and Sexual Activity  . Alcohol use: No    Alcohol/week: 0.0 standard drinks  . Drug use: No  . Sexual activity: Not on file  Lifestyle  . Physical activity:    Days per week: 0 days    Minutes per session: 0 min  . Stress: Not at all  Relationships  . Social connections:    Talks on phone: Three times a week    Gets together: Three times a week    Attends religious service: Never    Active member of club or organization: No    Attends meetings of clubs or organizations: Never    Relationship status: Divorced  Other Topics Concern  . Not on file  Social History Narrative   Single. Lives alone will be living in a trailer near family friends Otho Perl and husband Renee Rival- will be staying in their trailer). They watch out for him.    Sister is also involved heavily.       Disabled after CVA, low literacy at baseline      Enjoys hunting   Tobacco Counseling Ready to quit: Not Answered Counseling given: Not Answered   Clinical Intake:  Pre-visit preparation completed: No  Pain : No/denies pain     Nutritional Risks: Other (Comment) Diabetes: Yes CBG done?: No Did pt. bring in CBG monitor from home?: No  How often do you need to have someone help you when you read instructions, pamphlets, or other written materials from your doctor or pharmacy?: 2 - Rarely What is the last grade level you completed in school?: [place]?:  No  Information entered by :: Tyson Dense, RN  Activities of Daily Living In your present state of health, do you have any difficulty performing the following activities: 01/06/2018  Hearing? N  Vision? N  Difficulty concentrating or making decisions? N  Walking or climbing stairs? Y  Dressing or bathing? Y  Doing errands, shopping? Y  Preparing Food and eating ? Y  Using the Toilet? Y  In the past six months, have  you accidently leaked urine? Y  Do you have problems with loss of bowel control? Y  Managing your Medications? Y  Managing your Finances? Y  Housekeeping or managing your Housekeeping? Y  Some recent data might be hidden     Immunizations and Health Maintenance Immunization History  Administered Date(s) Administered  . Influenza Split 12/30/2011  . Influenza Whole 05/23/2007, 01/30/2008, 01/24/2009, 01/21/2010, 01/19/2011  . Influenza,inj,Quad PF,6+ Mos 02/02/2013, 01/02/2014, 03/11/2015, 02/06/2016, 02/09/2017  . PPD Test 08/23/2017, 11/09/2017  . Pneumococcal Polysaccharide-23 12/25/2010, 08/23/2017  . Tdap 12/25/2010   There are no preventive care reminders to display for this patient.  Patient Care Team: Marin Olp, MD as PCP - General (Family Medicine)  Indicate any recent Medical Services you may have received from other than [facility] providers in the past year (date may be approximate).    Assessment:   This is a routine wellness examination for William [name].  Hearing/Vision screen No exam data present  Dietary issues and exercise activities discussed: Current Exercise Habits: The patient does not participate in regular exercise at present, Exercise limited by: orthopedic condition(s)  Goals   None    Depression Screen PHQ 2/9 Scores 01/06/2018 02/09/2017  PHQ - 2 Score 0 0    Fall Risk Fall Risk  01/06/2018 02/09/2017  Falls in the past year? No Yes  Number falls in past yr: - 1  Injury with Fall? - No    Is the patient's home free of  loose throw rugs in walkways, pet beds, electrical cords, etc?   yes      Grab bars in the bathroom? yes      Handrails on the stairs?   yes      Adequate lighting?   yes  Cognitive Function:     6CIT Screen 01/06/2018  What Year? 0 points  What month? 3 points  What time? 0 points  Count back from 20 0 points  Months in reverse 4 points  Repeat phrase 4 points  Total Score 11    Screening Tests Health Maintenance  Topic Date Due  . INFLUENZA VACCINE  03/04/2018 (Originally 11/18/2017)  . OPHTHALMOLOGY EXAM  08/25/2018 (Originally 07/13/2017)  . HEMOGLOBIN A1C  02/19/2018  . FOOT EXAM  12/17/2018  . TETANUS/TDAP  12/24/2020  . COLONOSCOPY  09/02/2025  . PNEUMOCOCCAL POLYSACCHARIDE VACCINE AGE 7-64 HIGH RISK  Completed  . Hepatitis C Screening  Completed  . HIV Screening  Completed    Qualifies for Shingles Vaccine? Not in past records  Cancer Screenings: Lung: Low Dose CT Chest recommended if Age 66-80 years, 30 pack-year currently smoking OR have quit w/in 15years. Patient does not qualify. Colorectal: up to date  Additional Screenings:  Hepatitis C Screening: declined Flu vaccine due: will receive at [facility] Diabetic eye exam due, ordered     Plan:    I have personally reviewed and addressed the Medicare Annual Wellness questionnaire and have noted the following in the patient's chart:  A. Medical and social history B. Use of alcohol, tobacco or illicit drugs  C. Current medications and supplements D. Functional ability and status E.  Nutritional status F.  Physical activity G. Advance directives H. List of other physicians I.  Hospitalizations, surgeries, and ER visits in previous 12 months J.  [place] to include hearing, vision, cognitive, depression L. Referrals and appointments - none  In addition, I have reviewed and discussed with patient certain preventive protocols, quality metrics, and best practice recommendations. A written  personalized care plan for preventive services as well as general preventive health recommendations were provided to patient.  See attached scanned questionnaire for additional information.   Signed,   Tyson Dense, RN Nurse Health Advisor  Patient Concerns: Has been feeling lightheaded today and "just off"

## 2018-01-07 ENCOUNTER — Non-Acute Institutional Stay (SKILLED_NURSING_FACILITY): Payer: Medicare Other | Admitting: Adult Health

## 2018-01-07 DIAGNOSIS — E1169 Type 2 diabetes mellitus with other specified complication: Secondary | ICD-10-CM | POA: Diagnosis not present

## 2018-01-07 DIAGNOSIS — I69354 Hemiplegia and hemiparesis following cerebral infarction affecting left non-dominant side: Secondary | ICD-10-CM | POA: Diagnosis not present

## 2018-01-07 DIAGNOSIS — K219 Gastro-esophageal reflux disease without esophagitis: Secondary | ICD-10-CM | POA: Diagnosis not present

## 2018-01-07 DIAGNOSIS — E785 Hyperlipidemia, unspecified: Secondary | ICD-10-CM

## 2018-01-07 DIAGNOSIS — I6389 Other cerebral infarction: Secondary | ICD-10-CM | POA: Diagnosis not present

## 2018-01-07 DIAGNOSIS — F320 Major depressive disorder, single episode, mild: Secondary | ICD-10-CM

## 2018-01-07 NOTE — Progress Notes (Signed)
Location:   [place] Room Number: [id number] Place of Service:  SNF (31)   CODE STATUS: dnr  Allergies  Allergen Reactions  . Sulfamethoxazole     REACTION: rash    Chief Complaint  Patient presents with  . Medical Management of Chronic Issues    Cva; dyslipidemia; gerd depression.     HPI:  He is a 57 year old long term resident of this facility being seen for the management of his chronic illnesses: cva; dyslipidemia; gerd; depression. He denies any anxiety or depressive feelings; no heart burn.    Past Medical History:  Diagnosis Date  . CEREBROVASCULAR ACCIDENT, HX OF 09/28/2006   2007 affected left side and peripheral vision  . DIABETES MELLITUS, TYPE II 09/28/2006  . GERD (gastroesophageal reflux disease)   . HYPERLIPIDEMIA 09/28/2006  . HYPERTENSION 09/28/2006  . Learning disability   . Stroke [facility]) years ago    Past Surgical History:  Procedure Laterality Date  . COLONOSCOPY    . LYMPHADENECTOMY    . RHINOPLASTY    . SIGMOIDOSCOPY      Social History   Socioeconomic History  . Marital status: Divorced    Spouse name: Not on file  . Number of children: Not on file  . Years of education: Not on file  . Highest education level: Not on file  Occupational History  . Not on file  Social Needs  . Financial resource strain: Not hard at all  . Food insecurity:    Worry: Never true    Inability: Never true  . Transportation needs:    Medical: No    Non-medical: No  Tobacco Use  . Smoking status: Current Every Day Smoker    Packs/day: 1.00    Types: Cigarettes  . Smokeless tobacco: Former Network engineer and Sexual Activity  . Alcohol use: No    Alcohol/week: 0.0 standard drinks  . Drug use: No  . Sexual activity: Not on file  Lifestyle  . Physical activity:    Days per week: 0 days    Minutes per session: 0 min  . Stress: Not at all  Relationships  . Social connections:    Talks on phone: Three times a week    Gets together:  Three times a week    Attends religious service: Never    Active member of club or organization: No    Attends meetings of clubs or organizations: Never    Relationship status: Divorced  . Intimate partner violence:    Fear of current or ex partner: No    Emotionally abused: No    Physically abused: No    Forced sexual activity: No  Other Topics Concern  . Not on file  Social History Narrative   Single. Lives alone will be living in a trailer near family friends Otho Perl and husband Renee Rival- will be staying in their trailer). They watch out for him.    Sister is also involved heavily.       Disabled after CVA, low literacy at baseline      Enjoys hunting   Family History  Problem Relation Age of Onset  . Lung cancer Mother   . Diabetes Father   . Stroke Father   . Alcohol abuse Brother   . Diabetes Brother   . Heart attack Sister   . Colon cancer Neg Hx       VITAL SIGNS BP (!) 150/80   Pulse 73  Temp (!) 97.5 F (36.4 C)   Resp 18   Ht 5\' 8"  (1.727 m)   Wt 177 lb (80.3 kg)   SpO2 93%   BMI 26.91 kg/m   Outpatient Encounter Medications as of 01/07/2018  Medication Sig  . amLODipine (NORVASC) 10 MG tablet Take 10 mg by mouth daily.   [name] aspirin 325 MG tablet Take 1 tablet (325 mg total) by mouth daily.  [name] atorvastatin (LIPITOR) 20 MG tablet TAKE 1 TABLET BY MOUTH  DAILY  . buPROPion (WELLBUTRIN XL) 150 MG 24 hr tablet Take 1 tablet (150 mg total) by mouth daily.  [name] escitalopram (LEXAPRO) 5 MG tablet Take 1 tablet (5 mg total) by mouth daily.  . ferrous sulfate 325 (65 FE) MG tablet Take 1 tablet (325 mg total) by mouth 2 (two) times daily with a meal.  . insulin aspart (NOVOLOG PENFILL) cartridge Inject 18 Units into the skin 3 (three) times daily before meals. For CBG equal to or greater than 150   . Insulin Glargine (LANTUS SOLOSTAR) 100 UNIT/ML Solostar Pen Inject 60 Units into the skin at bedtime.   [name] labetalol (NORMODYNE) 200 MG tablet Take 200 mg by  mouth 2 (two) times daily.  [name] linagliptin (TRADJENTA) 5 MG TABS tablet Take 5 mg by mouth daily.  [name] losartan-hydrochlorothiazide (HYZAAR) 100-25 MG tablet Take 1 tablet by mouth daily.  [name] omeprazole (PRILOSEC) 20 MG capsule TAKE 1 CAPSULE BY MOUTH  DAILY  . [DISCONTINUED] acetaminophen (TYLENOL) 325 MG tablet Take 2 tablets (650 mg total) by mouth every 6 (six) hours as needed for mild pain (or Fever >/= 101).   No facility-administered encounter medications on file as of 01/07/2018.      SIGNIFICANT DIAGNOSTIC EXAMS   PREVIOUS:   08-19-17: MRI of thoracic and lumbar spine: 1. Abnormal upper thoracic spinal cord signal affecting a 4.5 cm segment from T2-T3 to T4-T5. Central cord involvement with borderline to mild cord expansion. No enhancement. No other spinal cord signal abnormality, despite multilevel degenerative spinal stenosis elsewhere (see #2). In light of the patient history and Brain MRI findings today a subacute or chronic spinal cord infarct seems most likely. 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 The lack of enhancement argues against an acute inflammatory process of the cord. 2. Superimposed multilevel degenerative spinal stenosis, exacerbated in the thoracic spine by epidural lipomatosis. Predominantly mild degenerative cord mass effect occurs, but at T5-T6 there is moderate to severe stenosis with moderate cord mass effect, but no cord signal abnormality at that level. 3. Moderate degenerative lumbar spinal and lateral recess stenosis at L4-L5. 4.  No acute osseous abnormality identified in the spine.  08-19-17: mri of brain: 1. See abnormal Thoracic Spine MRI findings today reported separately. 2. Very advanced chronic small vessel disease affecting the deep white matter, deep gray matter, and brainstem with two  superimposed subacute appearing lacunar infarcts. No hemorrhage or mass effect.  08-19-17: carotid doppler: Right Carotid: Velocities in the right ICA are consistent with a 1-39% stenosis. Left Carotid: Velocities in the left ICA are consistent with a 1-39% stenosis. Vertebrals:  Bilateral vertebral arteries demonstrate antegrade flow. Subclavians: Normal flow hemodynamics were seen in bilateral subclavian arteries.   08-20-17: 2- echo: - Left ventricle: Septal apical and inferior wall hypokinesis regionality suggests CAD. The cavity size was severely dilated. Wall thickness  was increased in a pattern of severe LVH. Systolic function was moderately to severely reduced. The estimated ejection fraction was in the range of 30% to 35%. Doppler parameters are consistent with both elevated ventricular end-diastolic filling pressure and elevated left atrial filling pressure. - Atrial septum: No defect or patent foramen ovale was identified.  NO NEW EXAM   LABS REVIEWED PREVIOUS:   08-18-17: wbc 13.1; hgb 15.3;hct43.8; mcv 92.8;  plt 238; glucose 86; bun 30; creat 1.71; k+ 3.9; na++ 140; ca 9.4 08-19-17: wbc 11.8; hgb 13.4; hct 39.3; mcv 92.9 plt 196; glucose 72; bun 32; creat 1.86; k+ 3.5; na++ 141; ca 8.7; tsh 4.327; vit B 12: 517; folate 15.0; hgb a1c 9.2; copper 112; HIV/RPR: nr 08-20-17: chol 120; ldl 60 trig 181; hdl 24 08-23-17: glucose 167; bun 18; creat 1.56; k+ 3.9; na++ 138; ca 8.8  NO NEW LABS.    Review of Systems  Constitutional: Negative for malaise/fatigue.  Respiratory: Negative for cough and shortness of breath.   Cardiovascular: Negative for chest pain, palpitations and leg swelling.  Gastrointestinal: Negative for abdominal pain, constipation and heartburn.  Musculoskeletal: Negative for back pain, joint pain and myalgias.  Skin: Negative.   Neurological: Negative for dizziness.  Psychiatric/Behavioral: The patient is not nervous/anxious.     Physical Exam  Constitutional: He is  oriented to person, place, and time. He appears well-developed and well-nourished. No distress.  Neck: No thyromegaly present.  Cardiovascular: Normal rate, regular rhythm and intact distal pulses.  Murmur heard. 1/6  Pulmonary/Chest: Effort normal and breath sounds normal. No respiratory distress.  Abdominal: Soft. Bowel sounds are normal. He exhibits no distension. There is no tenderness.  Musculoskeletal: He exhibits no edema.  Is able to move all extremities   Lymphadenopathy:    He has no cervical adenopathy.  Neurological: He is alert and oriented to person, place, and time.  Skin: Skin is warm and dry. He is not diaphoretic.  Psychiatric: He has a normal mood and affect.    ASSESSMENT/ PLAN:  TODAY:   1. Depression major; single episode mild: is stable will continue lexapro 5 mg daily and wellbutrin xl 150 mg daily   2. Jerrye Bushy without esophagitis: stable will continue prilosec 20 mg daily   3.  CVA has hemiparesis affecting left side as late effect of cerebrovascular accident: is stable will continue asa 325 mg daily   4.  Dyslipidemia associated with type 2 diabetes: stable ldl 60 will continue lipitor 20 mg daily   PREVIOUS  5. Hypertension associated with diabetes: is stable b/p 132/74; will continue labetolol 200 mg twice daily hyzaar 150/80 mg daily and norvasc  10 mg daily   6.  Type 2 diabetes mellitus uncontrolled with renal manifestations: without change hgb a1c 9.2 (prevoius9.0);  Is on arb; statin; asa  Will continue  lantus 60 units nightly and novolog 18 units after meals and tradjenta 5 mg daily   7. Stage 3 chronic renal impairment associated with type 2 diabetes mellitus: stable bun 18; creat 1.56; will monitor  8. Anemia of chronic disease: stable hgb 13.4; will lower iron to one time daily     MD is aware of resident's narcotic use and is in agreement with current plan of care. We will attempt to wean resident as apropriate   Ok Edwards NP [facility]  Adult Medicine  Contact [phone] Monday through Friday 8am- 5pm  After hours call [phone]

## 2018-01-31 DIAGNOSIS — R262 Difficulty in walking, not elsewhere classified: Secondary | ICD-10-CM | POA: Diagnosis not present

## 2018-01-31 DIAGNOSIS — R278 Other lack of coordination: Secondary | ICD-10-CM | POA: Diagnosis not present

## 2018-01-31 DIAGNOSIS — I639 Cerebral infarction, unspecified: Secondary | ICD-10-CM | POA: Diagnosis not present

## 2018-02-02 DIAGNOSIS — R278 Other lack of coordination: Secondary | ICD-10-CM | POA: Diagnosis not present

## 2018-02-02 DIAGNOSIS — I639 Cerebral infarction, unspecified: Secondary | ICD-10-CM | POA: Diagnosis not present

## 2018-02-02 DIAGNOSIS — R262 Difficulty in walking, not elsewhere classified: Secondary | ICD-10-CM | POA: Diagnosis not present

## 2018-02-03 DIAGNOSIS — I639 Cerebral infarction, unspecified: Secondary | ICD-10-CM | POA: Diagnosis not present

## 2018-02-03 DIAGNOSIS — R278 Other lack of coordination: Secondary | ICD-10-CM | POA: Diagnosis not present

## 2018-02-03 DIAGNOSIS — R262 Difficulty in walking, not elsewhere classified: Secondary | ICD-10-CM | POA: Diagnosis not present

## 2018-02-04 ENCOUNTER — Encounter: Payer: Self-pay | Admitting: Adult Health

## 2018-02-04 ENCOUNTER — Non-Acute Institutional Stay (SKILLED_NURSING_FACILITY): Payer: Medicare Other | Admitting: Adult Health

## 2018-02-04 DIAGNOSIS — R262 Difficulty in walking, not elsewhere classified: Secondary | ICD-10-CM | POA: Diagnosis not present

## 2018-02-04 DIAGNOSIS — N183 Chronic kidney disease, stage 3 unspecified: Secondary | ICD-10-CM

## 2018-02-04 DIAGNOSIS — R278 Other lack of coordination: Secondary | ICD-10-CM | POA: Diagnosis not present

## 2018-02-04 DIAGNOSIS — I129 Hypertensive chronic kidney disease with stage 1 through stage 4 chronic kidney disease, or unspecified chronic kidney disease: Secondary | ICD-10-CM | POA: Diagnosis not present

## 2018-02-04 DIAGNOSIS — Z794 Long term (current) use of insulin: Secondary | ICD-10-CM

## 2018-02-04 DIAGNOSIS — E1122 Type 2 diabetes mellitus with diabetic chronic kidney disease: Secondary | ICD-10-CM | POA: Insufficient documentation

## 2018-02-04 DIAGNOSIS — I639 Cerebral infarction, unspecified: Secondary | ICD-10-CM | POA: Diagnosis not present

## 2018-02-04 NOTE — Progress Notes (Signed)
Location:   [place] Room Number: [id number] Place of Service:  SNF (31)   CODE STATUS: dnr  Allergies  Allergen Reactions  . Sulfamethoxazole     REACTION: rash    Chief Complaint  Patient presents with  . Medical Management of Chronic Issues    Hypertension associated with stage 3 chronic renal disease due to type 2 diabetes mellitus; type 2 diabetes mellitus with stage 3 chronic kidney disease with long term current use of insulin; stage 3 chronic renal impairment associated with type 2 diabetes mellitus.     HPI:  He is a 57 year old long term resident of this facility being seen for the management of his chronic illnesses: hypertension; diabetes; chronic renal failure. He denies any headaches; no excessive hunger or thirst; no uncontrolled pain.   Past Medical History:  Diagnosis Date  . CEREBROVASCULAR ACCIDENT, HX OF 09/28/2006   2007 affected left side and peripheral vision  . DIABETES MELLITUS, TYPE II 09/28/2006  . GERD (gastroesophageal reflux disease)   . HYPERLIPIDEMIA 09/28/2006  . HYPERTENSION 09/28/2006  . Learning disability   . Stroke [facility]) years ago    Past Surgical History:  Procedure Laterality Date  . COLONOSCOPY    . LYMPHADENECTOMY    . RHINOPLASTY    . SIGMOIDOSCOPY      Social History   Socioeconomic History  . Marital status: Divorced    Spouse name: Not on file  . Number of children: Not on file  . Years of education: Not on file  . Highest education level: Not on file  Occupational History  . Not on file  Social Needs  . Financial resource strain: Not hard at all  . Food insecurity:    Worry: Never true    Inability: Never true  . Transportation needs:    Medical: No    Non-medical: No  Tobacco Use  . Smoking status: Current Every Day Smoker    Packs/day: 1.00    Types: Cigarettes  . Smokeless tobacco: Former Network engineer and Sexual Activity  . Alcohol use: No    Alcohol/week: 0.0 standard drinks  . Drug  use: No  . Sexual activity: Not on file  Lifestyle  . Physical activity:    Days per week: 0 days    Minutes per session: 0 min  . Stress: Not at all  Relationships  . Social connections:    Talks on phone: Three times a week    Gets together: Three times a week    Attends religious service: Never    Active member of club or organization: No    Attends meetings of clubs or organizations: Never    Relationship status: Divorced  . Intimate partner violence:    Fear of current or ex partner: No    Emotionally abused: No    Physically abused: No    Forced sexual activity: No  Other Topics Concern  . Not on file  Social History Narrative   Single. Lives alone will be living in a trailer near family friends Otho Perl and husband Renee Rival- will be staying in their trailer). They watch out for him.    Sister is also involved heavily.       Disabled after CVA, low literacy at baseline      Enjoys hunting   Family History  Problem Relation Age of Onset  . Lung cancer Mother   . Diabetes Father   . Stroke Father   .  Alcohol abuse Brother   . Diabetes Brother   . Heart attack Sister   . Colon cancer Neg Hx       VITAL SIGNS BP 110/68   Pulse 76   Temp 98.1 F (36.7 C)   Resp 16   Ht 5\' 8"  (1.727 m)   Wt 177 lb (80.3 kg)   SpO2 98%   BMI 26.91 kg/m   Outpatient Encounter Medications as of 02/04/2018  Medication Sig  . amLODipine (NORVASC) 10 MG tablet Take 10 mg by mouth daily.   [name] aspirin 325 MG tablet Take 1 tablet (325 mg total) by mouth daily.  [name] atorvastatin (LIPITOR) 20 MG tablet TAKE 1 TABLET BY MOUTH  DAILY  . buPROPion (WELLBUTRIN XL) 150 MG 24 hr tablet Take 1 tablet (150 mg total) by mouth daily.  [name] escitalopram (LEXAPRO) 5 MG tablet Take 1 tablet (5 mg total) by mouth daily.  . ferrous sulfate 325 (65 FE) MG tablet Take 1 tablet (325 mg total) by mouth 2 (two) times daily with a meal.  . insulin aspart (NOVOLOG PENFILL) cartridge Inject 18 Units into  the skin 3 (three) times daily before meals. For CBG equal to or greater than 150   . Insulin Glargine (LANTUS SOLOSTAR) 100 UNIT/ML Solostar Pen Inject 60 Units into the skin at bedtime.   [name] labetalol (NORMODYNE) 200 MG tablet Take 200 mg by mouth 2 (two) times daily.  [name] linagliptin (TRADJENTA) 5 MG TABS tablet Take 5 mg by mouth daily.  [name] losartan-hydrochlorothiazide (HYZAAR) 100-25 MG tablet Take 1 tablet by mouth daily.  [name] omeprazole (PRILOSEC) 20 MG capsule TAKE 1 CAPSULE BY MOUTH  DAILY   No facility-administered encounter medications on file as of 02/04/2018.      SIGNIFICANT DIAGNOSTIC EXAMS   PREVIOUS:   08-19-17: MRI of thoracic and lumbar spine: 1. Abnormal upper thoracic spinal cord signal affecting a 4.5 cm segment from T2-T3 to T4-T5. Central cord involvement with borderline to mild cord expansion. No enhancement. No other spinal cord signal abnormality, despite multilevel degenerative spinal stenosis elsewhere (see #2). In light of the patient history and Brain MRI findings today a subacute or chronic spinal cord infarct seems most likely. 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 The lack of enhancement argues against an acute inflammatory process of the cord. 2. Superimposed multilevel degenerative spinal stenosis, exacerbated in the thoracic spine by epidural lipomatosis. Predominantly mild degenerative cord mass effect occurs, but at T5-T6 there is moderate to severe stenosis with moderate cord mass effect, but no cord signal abnormality at that level. 3. Moderate degenerative lumbar spinal and lateral recess stenosis at L4-L5. 4.  No acute osseous abnormality identified in the spine.  08-19-17: mri of brain: 1. See abnormal Thoracic Spine MRI findings today reported separately. 2. Very advanced chronic small  vessel disease affecting the deep white matter, deep gray matter, and brainstem with two superimposed subacute appearing lacunar infarcts. No hemorrhage or mass effect.  08-19-17: carotid doppler: Right Carotid: Velocities in the right ICA are consistent with a 1-39% stenosis. Left Carotid: Velocities in the left ICA are consistent with a 1-39% stenosis. Vertebrals:  Bilateral vertebral arteries demonstrate antegrade flow. Subclavians: Normal flow hemodynamics were seen in bilateral subclavian arteries.   08-20-17: 2- echo: - Left ventricle: Septal apical and inferior wall hypokinesis regionality suggests CAD. The  cavity size was severely dilated. Wall thickness was increased in a pattern of severe LVH. Systolic function was moderately to severely reduced. The estimated ejection fraction was in the range of 30% to 35%. Doppler parameters are consistent with both elevated ventricular end-diastolic filling pressure and elevated left atrial filling pressure. - Atrial septum: No defect or patent foramen ovale was identified.  NO NEW EXAM   LABS REVIEWED PREVIOUS:   08-18-17: wbc 13.1; hgb 15.3;hct43.8; mcv 92.8;  plt 238; glucose 86; bun 30; creat 1.71; k+ 3.9; na++ 140; ca 9.4 08-19-17: wbc 11.8; hgb 13.4; hct 39.3; mcv 92.9 plt 196; glucose 72; bun 32; creat 1.86; k+ 3.5; na++ 141; ca 8.7; tsh 4.327; vit B 12: 517; folate 15.0; hgb a1c 9.2; copper 112; HIV/RPR: nr 08-20-17: chol 120; ldl 60 trig 181; hdl 24 08-23-17: glucose 167; bun 18; creat 1.56; k+ 3.9; na++ 138; ca 8.8  TODAY:   12-22-17: urine culture: proteus mirabilis     Review of Systems  Constitutional: Negative for malaise/fatigue.  Respiratory: Negative for cough and shortness of breath.   Cardiovascular: Negative for chest pain, palpitations and leg swelling.  Gastrointestinal: Negative for abdominal pain, constipation and heartburn.  Musculoskeletal: Negative for back pain, joint pain and myalgias.  Skin: Negative.   Neurological:  Negative for dizziness.  Psychiatric/Behavioral: The patient is not nervous/anxious.       Physical Exam  Constitutional: He is oriented to person, place, and time. He appears well-developed and well-nourished. No distress.  Neck: No thyromegaly present.  Cardiovascular: Normal rate, regular rhythm and intact distal pulses.  Murmur heard. 1/6  Pulmonary/Chest: Effort normal and breath sounds normal. No respiratory distress.  Abdominal: Soft. Bowel sounds are normal. He exhibits no distension. There is no tenderness.  Musculoskeletal: He exhibits no edema.  Able to move all extremities Uses wheelchair   Lymphadenopathy:    He has no cervical adenopathy.  Neurological: He is alert and oriented to person, place, and time.  Skin: Skin is warm and dry. He is not diaphoretic.  Psychiatric: He has a normal mood and affect.   ASSESSMENT/ PLAN:  TODAY:   1. Hypertension associated with stage 3 chronic renal disease due tpe 2 diabetes mellitus : is stable b/p 110/68; will continue labetolol 200 mg twice daily hyzaar 150/80 mg daily and norvasc  10 mg daily   2.  Type 2 diabetes mellitus with stage 3 chronic kidney disease with long-term current use of insulin: without change hgb a1c 9.2 (prevoius9.0);  Is on arb; statin; asa  Will continue  lantus 60 units nightly  tradjenta 5 mg daily and will change to novolog 18 units at breakfast 22 units at lunch and 18 units with supper.   3. Stage 3 chronic renal impairment associated with type 2 diabetes mellitus: stable bun 18; creat 1.56; will monitor  PREVIOUS  4. Anemia of chronic disease: stable hgb 13.4; will lower iron to one time daily   5. Depression major; single episode mild: is stable will continue lexapro 5 mg daily and wellbutrin xl 150 mg daily   6. Gerd without esophagitis: stable will continue prilosec 20 mg daily   7.  CVA has hemiparesis affecting left side as late effect of cerebrovascular accident: is stable will continue asa  325 mg daily   8.  Dyslipidemia associated with type 2 diabetes: stable ldl 60 will continue lipitor 20 mg daily    Will check cbc cmp PSA hgb a1c; urine for micro-albumin will stop probiotic  MD is aware of resident's narcotic use and is in agreement with current plan of care. We will attempt to wean resident as apropriate   Ok Edwards NP [facility] Adult Medicine  Contact [phone] Monday through Friday 8am- 5pm  After hours call [phone]

## 2018-02-06 DIAGNOSIS — R262 Difficulty in walking, not elsewhere classified: Secondary | ICD-10-CM | POA: Diagnosis not present

## 2018-02-06 DIAGNOSIS — R278 Other lack of coordination: Secondary | ICD-10-CM | POA: Diagnosis not present

## 2018-02-06 DIAGNOSIS — I639 Cerebral infarction, unspecified: Secondary | ICD-10-CM | POA: Diagnosis not present

## 2018-02-07 DIAGNOSIS — R262 Difficulty in walking, not elsewhere classified: Secondary | ICD-10-CM | POA: Diagnosis not present

## 2018-02-07 DIAGNOSIS — R278 Other lack of coordination: Secondary | ICD-10-CM | POA: Diagnosis not present

## 2018-02-07 DIAGNOSIS — I639 Cerebral infarction, unspecified: Secondary | ICD-10-CM | POA: Diagnosis not present

## 2018-02-08 DIAGNOSIS — R278 Other lack of coordination: Secondary | ICD-10-CM | POA: Diagnosis not present

## 2018-02-08 DIAGNOSIS — I639 Cerebral infarction, unspecified: Secondary | ICD-10-CM | POA: Diagnosis not present

## 2018-02-08 DIAGNOSIS — E133553 Other specified diabetes mellitus with stable proliferative diabetic retinopathy, bilateral: Secondary | ICD-10-CM | POA: Diagnosis not present

## 2018-02-08 DIAGNOSIS — H25813 Combined forms of age-related cataract, bilateral: Secondary | ICD-10-CM | POA: Diagnosis not present

## 2018-02-08 DIAGNOSIS — H10413 Chronic giant papillary conjunctivitis, bilateral: Secondary | ICD-10-CM | POA: Diagnosis not present

## 2018-02-08 DIAGNOSIS — R262 Difficulty in walking, not elsewhere classified: Secondary | ICD-10-CM | POA: Diagnosis not present

## 2018-02-08 DIAGNOSIS — E083553 Diabetes mellitus due to underlying condition with stable proliferative diabetic retinopathy, bilateral: Secondary | ICD-10-CM | POA: Diagnosis not present

## 2018-02-09 DIAGNOSIS — R278 Other lack of coordination: Secondary | ICD-10-CM | POA: Diagnosis not present

## 2018-02-09 DIAGNOSIS — I639 Cerebral infarction, unspecified: Secondary | ICD-10-CM | POA: Diagnosis not present

## 2018-02-09 DIAGNOSIS — R262 Difficulty in walking, not elsewhere classified: Secondary | ICD-10-CM | POA: Diagnosis not present

## 2018-02-10 DIAGNOSIS — R278 Other lack of coordination: Secondary | ICD-10-CM | POA: Diagnosis not present

## 2018-02-10 DIAGNOSIS — I639 Cerebral infarction, unspecified: Secondary | ICD-10-CM | POA: Diagnosis not present

## 2018-02-10 DIAGNOSIS — R262 Difficulty in walking, not elsewhere classified: Secondary | ICD-10-CM | POA: Diagnosis not present

## 2018-02-11 DIAGNOSIS — I639 Cerebral infarction, unspecified: Secondary | ICD-10-CM | POA: Diagnosis not present

## 2018-02-11 DIAGNOSIS — R278 Other lack of coordination: Secondary | ICD-10-CM | POA: Diagnosis not present

## 2018-02-11 DIAGNOSIS — R262 Difficulty in walking, not elsewhere classified: Secondary | ICD-10-CM | POA: Diagnosis not present

## 2018-02-14 DIAGNOSIS — R278 Other lack of coordination: Secondary | ICD-10-CM | POA: Diagnosis not present

## 2018-02-14 DIAGNOSIS — R262 Difficulty in walking, not elsewhere classified: Secondary | ICD-10-CM | POA: Diagnosis not present

## 2018-02-14 DIAGNOSIS — I639 Cerebral infarction, unspecified: Secondary | ICD-10-CM | POA: Diagnosis not present

## 2018-02-15 DIAGNOSIS — R278 Other lack of coordination: Secondary | ICD-10-CM | POA: Diagnosis not present

## 2018-02-15 DIAGNOSIS — R262 Difficulty in walking, not elsewhere classified: Secondary | ICD-10-CM | POA: Diagnosis not present

## 2018-02-15 DIAGNOSIS — I639 Cerebral infarction, unspecified: Secondary | ICD-10-CM | POA: Diagnosis not present

## 2018-02-16 DIAGNOSIS — R262 Difficulty in walking, not elsewhere classified: Secondary | ICD-10-CM | POA: Diagnosis not present

## 2018-02-16 DIAGNOSIS — R278 Other lack of coordination: Secondary | ICD-10-CM | POA: Diagnosis not present

## 2018-02-16 DIAGNOSIS — I639 Cerebral infarction, unspecified: Secondary | ICD-10-CM | POA: Diagnosis not present

## 2018-02-17 DIAGNOSIS — R262 Difficulty in walking, not elsewhere classified: Secondary | ICD-10-CM | POA: Diagnosis not present

## 2018-02-17 DIAGNOSIS — I639 Cerebral infarction, unspecified: Secondary | ICD-10-CM | POA: Diagnosis not present

## 2018-02-17 DIAGNOSIS — R278 Other lack of coordination: Secondary | ICD-10-CM | POA: Diagnosis not present

## 2018-02-18 DIAGNOSIS — R278 Other lack of coordination: Secondary | ICD-10-CM | POA: Diagnosis not present

## 2018-02-18 DIAGNOSIS — I639 Cerebral infarction, unspecified: Secondary | ICD-10-CM | POA: Diagnosis not present

## 2018-02-18 DIAGNOSIS — R262 Difficulty in walking, not elsewhere classified: Secondary | ICD-10-CM | POA: Diagnosis not present

## 2018-02-21 DIAGNOSIS — R262 Difficulty in walking, not elsewhere classified: Secondary | ICD-10-CM | POA: Diagnosis not present

## 2018-02-21 DIAGNOSIS — R278 Other lack of coordination: Secondary | ICD-10-CM | POA: Diagnosis not present

## 2018-02-21 DIAGNOSIS — I639 Cerebral infarction, unspecified: Secondary | ICD-10-CM | POA: Diagnosis not present

## 2018-02-22 DIAGNOSIS — I639 Cerebral infarction, unspecified: Secondary | ICD-10-CM | POA: Diagnosis not present

## 2018-02-22 DIAGNOSIS — R278 Other lack of coordination: Secondary | ICD-10-CM | POA: Diagnosis not present

## 2018-02-22 DIAGNOSIS — R262 Difficulty in walking, not elsewhere classified: Secondary | ICD-10-CM | POA: Diagnosis not present

## 2018-02-23 DIAGNOSIS — I639 Cerebral infarction, unspecified: Secondary | ICD-10-CM | POA: Diagnosis not present

## 2018-02-23 DIAGNOSIS — R262 Difficulty in walking, not elsewhere classified: Secondary | ICD-10-CM | POA: Diagnosis not present

## 2018-02-23 DIAGNOSIS — R278 Other lack of coordination: Secondary | ICD-10-CM | POA: Diagnosis not present

## 2018-02-24 DIAGNOSIS — R262 Difficulty in walking, not elsewhere classified: Secondary | ICD-10-CM | POA: Diagnosis not present

## 2018-02-24 DIAGNOSIS — R278 Other lack of coordination: Secondary | ICD-10-CM | POA: Diagnosis not present

## 2018-02-24 DIAGNOSIS — I639 Cerebral infarction, unspecified: Secondary | ICD-10-CM | POA: Diagnosis not present

## 2018-02-25 DIAGNOSIS — R262 Difficulty in walking, not elsewhere classified: Secondary | ICD-10-CM | POA: Diagnosis not present

## 2018-02-25 DIAGNOSIS — I639 Cerebral infarction, unspecified: Secondary | ICD-10-CM | POA: Diagnosis not present

## 2018-02-25 DIAGNOSIS — R278 Other lack of coordination: Secondary | ICD-10-CM | POA: Diagnosis not present

## 2018-02-28 ENCOUNTER — Other Ambulatory Visit: Payer: Self-pay

## 2018-02-28 DIAGNOSIS — R262 Difficulty in walking, not elsewhere classified: Secondary | ICD-10-CM | POA: Diagnosis not present

## 2018-02-28 DIAGNOSIS — R278 Other lack of coordination: Secondary | ICD-10-CM | POA: Diagnosis not present

## 2018-02-28 DIAGNOSIS — I639 Cerebral infarction, unspecified: Secondary | ICD-10-CM | POA: Diagnosis not present

## 2018-02-28 NOTE — Patient Outreach (Signed)
[place] [facility]) Care Management  02/28/2018  William [name] October 01, 1960 [id number]   Medication Adherence call to Mr. William [name] left a message for patient to call back patient is due on Atorvastatin 20 mg and Losartan/ HCTZ 50/12.5 mg.Mr [name] is showing past due under [place].   [place] Management Direct Dial [phone]  Fax [phone] [name]

## 2018-03-01 DIAGNOSIS — I639 Cerebral infarction, unspecified: Secondary | ICD-10-CM | POA: Diagnosis not present

## 2018-03-01 DIAGNOSIS — R278 Other lack of coordination: Secondary | ICD-10-CM | POA: Diagnosis not present

## 2018-03-01 DIAGNOSIS — R262 Difficulty in walking, not elsewhere classified: Secondary | ICD-10-CM | POA: Diagnosis not present

## 2018-03-04 DIAGNOSIS — R262 Difficulty in walking, not elsewhere classified: Secondary | ICD-10-CM | POA: Diagnosis not present

## 2018-03-04 DIAGNOSIS — I639 Cerebral infarction, unspecified: Secondary | ICD-10-CM | POA: Diagnosis not present

## 2018-03-04 DIAGNOSIS — R278 Other lack of coordination: Secondary | ICD-10-CM | POA: Diagnosis not present

## 2018-03-06 DIAGNOSIS — R262 Difficulty in walking, not elsewhere classified: Secondary | ICD-10-CM | POA: Diagnosis not present

## 2018-03-06 DIAGNOSIS — I639 Cerebral infarction, unspecified: Secondary | ICD-10-CM | POA: Diagnosis not present

## 2018-03-06 DIAGNOSIS — R278 Other lack of coordination: Secondary | ICD-10-CM | POA: Diagnosis not present

## 2018-03-07 DIAGNOSIS — R278 Other lack of coordination: Secondary | ICD-10-CM | POA: Diagnosis not present

## 2018-03-07 DIAGNOSIS — I639 Cerebral infarction, unspecified: Secondary | ICD-10-CM | POA: Diagnosis not present

## 2018-03-07 DIAGNOSIS — R262 Difficulty in walking, not elsewhere classified: Secondary | ICD-10-CM | POA: Diagnosis not present

## 2018-03-08 DIAGNOSIS — R262 Difficulty in walking, not elsewhere classified: Secondary | ICD-10-CM | POA: Diagnosis not present

## 2018-03-08 DIAGNOSIS — R278 Other lack of coordination: Secondary | ICD-10-CM | POA: Diagnosis not present

## 2018-03-08 DIAGNOSIS — I639 Cerebral infarction, unspecified: Secondary | ICD-10-CM | POA: Diagnosis not present

## 2018-03-09 DIAGNOSIS — R262 Difficulty in walking, not elsewhere classified: Secondary | ICD-10-CM | POA: Diagnosis not present

## 2018-03-09 DIAGNOSIS — R278 Other lack of coordination: Secondary | ICD-10-CM | POA: Diagnosis not present

## 2018-03-09 DIAGNOSIS — I639 Cerebral infarction, unspecified: Secondary | ICD-10-CM | POA: Diagnosis not present

## 2018-03-10 DIAGNOSIS — R262 Difficulty in walking, not elsewhere classified: Secondary | ICD-10-CM | POA: Diagnosis not present

## 2018-03-10 DIAGNOSIS — I639 Cerebral infarction, unspecified: Secondary | ICD-10-CM | POA: Diagnosis not present

## 2018-03-10 DIAGNOSIS — I739 Peripheral vascular disease, unspecified: Secondary | ICD-10-CM | POA: Diagnosis not present

## 2018-03-10 DIAGNOSIS — B351 Tinea unguium: Secondary | ICD-10-CM | POA: Diagnosis not present

## 2018-03-10 DIAGNOSIS — R278 Other lack of coordination: Secondary | ICD-10-CM | POA: Diagnosis not present

## 2018-03-11 DIAGNOSIS — R262 Difficulty in walking, not elsewhere classified: Secondary | ICD-10-CM | POA: Diagnosis not present

## 2018-03-11 DIAGNOSIS — I639 Cerebral infarction, unspecified: Secondary | ICD-10-CM | POA: Diagnosis not present

## 2018-03-11 DIAGNOSIS — R278 Other lack of coordination: Secondary | ICD-10-CM | POA: Diagnosis not present

## 2018-03-14 DIAGNOSIS — R278 Other lack of coordination: Secondary | ICD-10-CM | POA: Diagnosis not present

## 2018-03-14 DIAGNOSIS — I639 Cerebral infarction, unspecified: Secondary | ICD-10-CM | POA: Diagnosis not present

## 2018-03-14 DIAGNOSIS — R262 Difficulty in walking, not elsewhere classified: Secondary | ICD-10-CM | POA: Diagnosis not present

## 2018-03-15 DIAGNOSIS — R278 Other lack of coordination: Secondary | ICD-10-CM | POA: Diagnosis not present

## 2018-03-15 DIAGNOSIS — R262 Difficulty in walking, not elsewhere classified: Secondary | ICD-10-CM | POA: Diagnosis not present

## 2018-03-15 DIAGNOSIS — I639 Cerebral infarction, unspecified: Secondary | ICD-10-CM | POA: Diagnosis not present

## 2018-03-16 DIAGNOSIS — I639 Cerebral infarction, unspecified: Secondary | ICD-10-CM | POA: Diagnosis not present

## 2018-03-16 DIAGNOSIS — R262 Difficulty in walking, not elsewhere classified: Secondary | ICD-10-CM | POA: Diagnosis not present

## 2018-03-16 DIAGNOSIS — R278 Other lack of coordination: Secondary | ICD-10-CM | POA: Diagnosis not present

## 2018-03-18 DIAGNOSIS — R278 Other lack of coordination: Secondary | ICD-10-CM | POA: Diagnosis not present

## 2018-03-18 DIAGNOSIS — I639 Cerebral infarction, unspecified: Secondary | ICD-10-CM | POA: Diagnosis not present

## 2018-03-18 DIAGNOSIS — R262 Difficulty in walking, not elsewhere classified: Secondary | ICD-10-CM | POA: Diagnosis not present

## 2018-03-19 DIAGNOSIS — R262 Difficulty in walking, not elsewhere classified: Secondary | ICD-10-CM | POA: Diagnosis not present

## 2018-03-19 DIAGNOSIS — R278 Other lack of coordination: Secondary | ICD-10-CM | POA: Diagnosis not present

## 2018-03-19 DIAGNOSIS — I639 Cerebral infarction, unspecified: Secondary | ICD-10-CM | POA: Diagnosis not present

## 2018-03-21 DIAGNOSIS — I639 Cerebral infarction, unspecified: Secondary | ICD-10-CM | POA: Diagnosis not present

## 2018-03-21 DIAGNOSIS — R278 Other lack of coordination: Secondary | ICD-10-CM | POA: Diagnosis not present

## 2018-03-21 DIAGNOSIS — R262 Difficulty in walking, not elsewhere classified: Secondary | ICD-10-CM | POA: Diagnosis not present

## 2018-03-22 DIAGNOSIS — R262 Difficulty in walking, not elsewhere classified: Secondary | ICD-10-CM | POA: Diagnosis not present

## 2018-03-22 DIAGNOSIS — R278 Other lack of coordination: Secondary | ICD-10-CM | POA: Diagnosis not present

## 2018-03-22 DIAGNOSIS — I639 Cerebral infarction, unspecified: Secondary | ICD-10-CM | POA: Diagnosis not present

## 2018-03-23 DIAGNOSIS — R262 Difficulty in walking, not elsewhere classified: Secondary | ICD-10-CM | POA: Diagnosis not present

## 2018-03-23 DIAGNOSIS — I639 Cerebral infarction, unspecified: Secondary | ICD-10-CM | POA: Diagnosis not present

## 2018-03-23 DIAGNOSIS — R278 Other lack of coordination: Secondary | ICD-10-CM | POA: Diagnosis not present

## 2018-03-24 DIAGNOSIS — R278 Other lack of coordination: Secondary | ICD-10-CM | POA: Diagnosis not present

## 2018-03-24 DIAGNOSIS — I639 Cerebral infarction, unspecified: Secondary | ICD-10-CM | POA: Diagnosis not present

## 2018-03-24 DIAGNOSIS — R262 Difficulty in walking, not elsewhere classified: Secondary | ICD-10-CM | POA: Diagnosis not present

## 2018-03-25 DIAGNOSIS — I639 Cerebral infarction, unspecified: Secondary | ICD-10-CM | POA: Diagnosis not present

## 2018-03-25 DIAGNOSIS — R262 Difficulty in walking, not elsewhere classified: Secondary | ICD-10-CM | POA: Diagnosis not present

## 2018-03-25 DIAGNOSIS — R278 Other lack of coordination: Secondary | ICD-10-CM | POA: Diagnosis not present

## 2018-03-28 ENCOUNTER — Other Ambulatory Visit: Payer: Self-pay

## 2018-03-28 DIAGNOSIS — I639 Cerebral infarction, unspecified: Secondary | ICD-10-CM | POA: Diagnosis not present

## 2018-03-28 DIAGNOSIS — R278 Other lack of coordination: Secondary | ICD-10-CM | POA: Diagnosis not present

## 2018-03-28 DIAGNOSIS — R262 Difficulty in walking, not elsewhere classified: Secondary | ICD-10-CM | POA: Diagnosis not present

## 2018-03-28 NOTE — Patient Outreach (Signed)
[place] [facility]) Care Management  03/28/2018  William [name] 18-May-1960 [id number]   Medication Adherence call to Mr. William [name] left a message for patient to call back patient is due on Atorvastatin 20 mg and Losartan/ HCTZ 50/12.5 mg. William [name] is showing past due under [place].   [place] Management Direct Dial [phone]  Fax [phone] [name]

## 2018-03-28 NOTE — Patient Outreach (Signed)
[place] [facility]) Care Management  03/28/2018  William [name] 13-Dec-1960 [id number]   Medication Adherence call to Mr. Gillian Shields left a message for patient to call back he is due on Jardiance 25 mg last month he said he was receiving sample from the doctor's office. Mr.Perdue is showing past due under [place].   [place] Management Direct Dial [phone]  Fax [phone] [email or web address]

## 2018-03-29 DIAGNOSIS — I639 Cerebral infarction, unspecified: Secondary | ICD-10-CM | POA: Diagnosis not present

## 2018-03-29 DIAGNOSIS — R262 Difficulty in walking, not elsewhere classified: Secondary | ICD-10-CM | POA: Diagnosis not present

## 2018-03-29 DIAGNOSIS — R278 Other lack of coordination: Secondary | ICD-10-CM | POA: Diagnosis not present

## 2018-03-30 DIAGNOSIS — R262 Difficulty in walking, not elsewhere classified: Secondary | ICD-10-CM | POA: Diagnosis not present

## 2018-03-30 DIAGNOSIS — I639 Cerebral infarction, unspecified: Secondary | ICD-10-CM | POA: Diagnosis not present

## 2018-03-30 DIAGNOSIS — R278 Other lack of coordination: Secondary | ICD-10-CM | POA: Diagnosis not present

## 2018-04-11 DIAGNOSIS — N183 Chronic kidney disease, stage 3 (moderate): Secondary | ICD-10-CM | POA: Diagnosis not present

## 2018-04-11 DIAGNOSIS — I69354 Hemiplegia and hemiparesis following cerebral infarction affecting left non-dominant side: Secondary | ICD-10-CM | POA: Diagnosis not present

## 2018-04-11 DIAGNOSIS — I639 Cerebral infarction, unspecified: Secondary | ICD-10-CM | POA: Diagnosis not present

## 2018-04-11 DIAGNOSIS — E785 Hyperlipidemia, unspecified: Secondary | ICD-10-CM | POA: Diagnosis not present

## 2018-04-12 DIAGNOSIS — Z79899 Other long term (current) drug therapy: Secondary | ICD-10-CM | POA: Diagnosis not present

## 2018-04-12 DIAGNOSIS — N390 Urinary tract infection, site not specified: Secondary | ICD-10-CM | POA: Diagnosis not present

## 2018-04-12 DIAGNOSIS — E119 Type 2 diabetes mellitus without complications: Secondary | ICD-10-CM | POA: Diagnosis not present

## 2018-04-12 DIAGNOSIS — I10 Essential (primary) hypertension: Secondary | ICD-10-CM | POA: Diagnosis not present

## 2018-04-12 DIAGNOSIS — E039 Hypothyroidism, unspecified: Secondary | ICD-10-CM | POA: Diagnosis not present

## 2018-04-12 DIAGNOSIS — D649 Anemia, unspecified: Secondary | ICD-10-CM | POA: Diagnosis not present

## 2018-04-12 DIAGNOSIS — E785 Hyperlipidemia, unspecified: Secondary | ICD-10-CM | POA: Diagnosis not present

## 2018-04-14 DIAGNOSIS — I69354 Hemiplegia and hemiparesis following cerebral infarction affecting left non-dominant side: Secondary | ICD-10-CM | POA: Diagnosis not present

## 2018-04-14 DIAGNOSIS — N183 Chronic kidney disease, stage 3 (moderate): Secondary | ICD-10-CM | POA: Diagnosis not present

## 2018-04-14 DIAGNOSIS — I639 Cerebral infarction, unspecified: Secondary | ICD-10-CM | POA: Diagnosis not present

## 2018-04-14 DIAGNOSIS — E785 Hyperlipidemia, unspecified: Secondary | ICD-10-CM | POA: Diagnosis not present

## 2018-04-15 ENCOUNTER — Encounter: Payer: Self-pay | Admitting: Family Medicine

## 2018-04-27 ENCOUNTER — Telehealth: Payer: Self-pay

## 2018-04-27 DIAGNOSIS — E785 Hyperlipidemia, unspecified: Secondary | ICD-10-CM | POA: Diagnosis not present

## 2018-04-27 DIAGNOSIS — I639 Cerebral infarction, unspecified: Secondary | ICD-10-CM | POA: Diagnosis not present

## 2018-04-27 DIAGNOSIS — N183 Chronic kidney disease, stage 3 (moderate): Secondary | ICD-10-CM | POA: Diagnosis not present

## 2018-04-27 DIAGNOSIS — K219 Gastro-esophageal reflux disease without esophagitis: Secondary | ICD-10-CM | POA: Diagnosis not present

## 2018-04-27 NOTE — Telephone Encounter (Signed)
Per Dr. Yong Channel -  Looks like he needs 2019 diabetic eye exam. Please call pt or family to notify.   Called pt and left VM to call the office.

## 2018-05-04 DIAGNOSIS — M6281 Muscle weakness (generalized): Secondary | ICD-10-CM | POA: Diagnosis not present

## 2018-05-04 DIAGNOSIS — L130 Dermatitis herpetiformis: Secondary | ICD-10-CM | POA: Diagnosis not present

## 2018-05-07 ENCOUNTER — Emergency Department (HOSPITAL_COMMUNITY)
Admission: EM | Admit: 2018-05-07 | Discharge: 2018-05-07 | Disposition: A | Discharge status: Home / Self Care | Payer: Medicare Other | Attending: Emergency Medicine | Admitting: Emergency Medicine

## 2018-05-07 ENCOUNTER — Encounter (HOSPITAL_COMMUNITY): Payer: Self-pay

## 2018-05-07 ENCOUNTER — Other Ambulatory Visit: Payer: Self-pay

## 2018-05-07 DIAGNOSIS — R21 Rash and other nonspecific skin eruption: Secondary | ICD-10-CM

## 2018-05-07 DIAGNOSIS — F17210 Nicotine dependence, cigarettes, uncomplicated: Secondary | ICD-10-CM | POA: Insufficient documentation

## 2018-05-07 DIAGNOSIS — L299 Pruritus, unspecified: Secondary | ICD-10-CM | POA: Diagnosis not present

## 2018-05-07 DIAGNOSIS — I129 Hypertensive chronic kidney disease with stage 1 through stage 4 chronic kidney disease, or unspecified chronic kidney disease: Secondary | ICD-10-CM | POA: Diagnosis not present

## 2018-05-07 DIAGNOSIS — Z79899 Other long term (current) drug therapy: Secondary | ICD-10-CM | POA: Diagnosis not present

## 2018-05-07 DIAGNOSIS — R238 Other skin changes: Secondary | ICD-10-CM

## 2018-05-07 DIAGNOSIS — Y33XXXA Other specified events, undetermined intent, initial encounter: Secondary | ICD-10-CM | POA: Insufficient documentation

## 2018-05-07 DIAGNOSIS — Z794 Long term (current) use of insulin: Secondary | ICD-10-CM | POA: Insufficient documentation

## 2018-05-07 DIAGNOSIS — Z743 Need for continuous supervision: Secondary | ICD-10-CM | POA: Diagnosis not present

## 2018-05-07 DIAGNOSIS — N183 Chronic kidney disease, stage 3 (moderate): Secondary | ICD-10-CM | POA: Insufficient documentation

## 2018-05-07 DIAGNOSIS — S40822A Blister (nonthermal) of left upper arm, initial encounter: Secondary | ICD-10-CM | POA: Diagnosis not present

## 2018-05-07 DIAGNOSIS — S40821A Blister (nonthermal) of right upper arm, initial encounter: Secondary | ICD-10-CM | POA: Insufficient documentation

## 2018-05-07 DIAGNOSIS — Y939 Activity, unspecified: Secondary | ICD-10-CM | POA: Insufficient documentation

## 2018-05-07 DIAGNOSIS — Y929 Unspecified place or not applicable: Secondary | ICD-10-CM | POA: Diagnosis not present

## 2018-05-07 DIAGNOSIS — S30821A Blister (nonthermal) of abdominal wall, initial encounter: Secondary | ICD-10-CM | POA: Diagnosis not present

## 2018-05-07 DIAGNOSIS — Y999 Unspecified external cause status: Secondary | ICD-10-CM | POA: Insufficient documentation

## 2018-05-07 DIAGNOSIS — R279 Unspecified lack of coordination: Secondary | ICD-10-CM | POA: Diagnosis not present

## 2018-05-07 DIAGNOSIS — R5381 Other malaise: Secondary | ICD-10-CM | POA: Diagnosis not present

## 2018-05-07 MED ORDER — HYDROXYZINE HCL 25 MG PO TABS: 25.0000 mg | ORAL_TABLET | Freq: Four times a day (QID) | ORAL | 0 refills | Status: AC | PRN

## 2018-05-07 MED ORDER — DOXYCYCLINE HYCLATE 100 MG PO CAPS: 100.0000 mg | ORAL_CAPSULE | Freq: Two times a day (BID) | ORAL | 0 refills | Status: DC

## 2018-05-07 NOTE — ED Triage Notes (Signed)
Pt BIB EMS from [facility]. Pt has rash that has developed since Christmas time from arms to rest of body. Arms are wrapped at this time.   146/90 HR 70 CBG 118 98% RA

## 2018-05-07 NOTE — ED Notes (Signed)
Report Given to [place], caregiver at [facility]. Treatment plan reviewed with her by PA as well as RN.

## 2018-05-07 NOTE — ED Provider Notes (Signed)
[name] DEPT Provider Note   CSN: [id number] Arrival date & time: 05/07/18  1318     History   Chief Complaint No chief complaint on file.   HPI William [name] is a 58 y.o. male with a PMHx of CVA, DM2, HLD, HTN, CKD3, anemia, learning disability, and other conditions listed below, who presents to the ED with complaints of itchy rash to both arms, both legs, and torso that has been present for about 3 weeks.  He states that they look like blisters that then popped and have a clear fluid, and then leave a sore behind.  He has been on bacitracin, triamcinolone, permethrin, and prednisone but these have not provided any relief.  No known aggravating factors.  He denies any pain to the area, denies any purulent drainage, warmth, red streaking.  He also denies any fevers, chills, CP, SOB, abd pain, N/V/D/C, hematuria, dysuria, myalgias, arthralgias, numbness, tingling, focal weakness, or any other complaints at this time. No known sick contacts or exposure to similar rashes.  He denies any recent changes in soaps, detergents, skin products, animal or plant contact, or any other exposures.  The history is provided by the patient and medical records. No language interpreter was used.    Past Medical History:  Diagnosis Date  . CEREBROVASCULAR ACCIDENT, HX OF 09/28/2006   2007 affected left side and peripheral vision  . DIABETES MELLITUS, TYPE II 09/28/2006  . GERD (gastroesophageal reflux disease)   . HYPERLIPIDEMIA 09/28/2006  . HYPERTENSION 09/28/2006  . Learning disability   . Stroke [facility]) years ago    Patient Active Problem List   Diagnosis Date Noted  . Hypertension associated with stage 3 chronic kidney disease due to type 2 diabetes mellitus ([place]) 02/04/2018  . Hypertension associated with diabetes ([place]) 08/31/2017  . Hemiparesis affecting left side as late effect of cerebrovascular accident ([place]) 08/31/2017  . Dyslipidemia associated with type 2  diabetes mellitus ([place]) 08/31/2017  . Type 2 diabetes mellitus with stage 3 chronic kidney disease, with long-term current use of insulin ([place]) 08/31/2017  . Stage 3 chronic renal impairment associated with type 2 diabetes mellitus ([place]) 08/31/2017  . Anemia of chronic disease 08/31/2017  . Stroke (cerebrum) ([place]) 08/19/2017  . Frequent falls   . Urinary incontinence   . Abnormal gait 07/14/2017  . Depression, major, single episode, mild ([place]) 04/23/2017  . Inactivity 02/09/2017  . Iron deficiency anemia 06/25/2015  . CKD (chronic kidney disease), stage III ([place]) 04/30/2014  . Tobacco use disorder 11/21/2013  . GERD without esophagitis 09/25/2013  . Type II diabetes mellitus with renal manifestations ([place]) 09/28/2006  . Hyperlipidemia 09/28/2006  . Essential hypertension 09/28/2006  . History of cardiovascular disorder 09/28/2006    Past Surgical History:  Procedure Laterality Date  . COLONOSCOPY    . LYMPHADENECTOMY    . RHINOPLASTY    . SIGMOIDOSCOPY          Home Medications    Prior to Admission medications   Medication Sig Start Date End Date Taking? Authorizing Provider  amLODipine (NORVASC) 10 MG tablet Take 10 mg by mouth daily.  08/25/17   [provider]  aspirin 325 MG tablet Take 1 tablet (325 mg total) by mouth daily. 08/24/17   Domenic Polite, MD  atorvastatin (LIPITOR) 20 MG tablet TAKE 1 TABLET BY MOUTH  DAILY 04/22/17   Marin Olp, MD  buPROPion (WELLBUTRIN XL) 150 MG 24 hr tablet Take 1 tablet (150 mg total) by  mouth daily. 04/23/17   Marin Olp, MD  escitalopram (LEXAPRO) 5 MG tablet Take 1 tablet (5 mg total) by mouth daily. 07/05/17   William Deutscher, DO  ferrous sulfate 325 (65 FE) MG tablet Take 1 tablet (325 mg total) by mouth 2 (two) times daily with a meal. 03/24/17   Marin Olp, MD  insulin aspart (NOVOLOG PENFILL) cartridge Inject 18 Units into the skin 3 (three) times daily before meals. For CBG equal to or greater than 150   11/24/17   [provider]  Insulin Glargine (LANTUS SOLOSTAR) 100 UNIT/ML Solostar Pen Inject 60 Units into the skin at bedtime.  11/17/17   [provider]  labetalol (NORMODYNE) 200 MG tablet Take 200 mg by mouth 2 (two) times daily. 08/31/17   [provider]  linagliptin (TRADJENTA) 5 MG TABS tablet Take 5 mg by mouth daily. 09/27/17   [provider]  losartan-hydrochlorothiazide (HYZAAR) 100-25 MG tablet Take 1 tablet by mouth daily.    [provider]  omeprazole (PRILOSEC) 20 MG capsule TAKE 1 CAPSULE BY MOUTH  DAILY 07/29/17   Marin Olp, MD    Family History Family History  Problem Relation Age of Onset  . Lung cancer Mother   . Diabetes Father   . Stroke Father   . Alcohol abuse Brother   . Diabetes Brother   . Heart attack Sister   . Colon cancer Neg Hx     Social History Social History   Tobacco Use  . Smoking status: Current Every Day Smoker    Packs/day: 1.00    Types: Cigarettes  . Smokeless tobacco: Former Network engineer Use Topics  . Alcohol use: No    Alcohol/week: 0.0 standard drinks  . Drug use: No     Allergies   Sulfamethoxazole   Review of Systems Review of Systems  Constitutional: Negative for chills and fever.  Respiratory: Negative for shortness of breath.   Cardiovascular: Negative for chest pain.  Gastrointestinal: Negative for abdominal pain, constipation, diarrhea, nausea and vomiting.  Genitourinary: Negative for dysuria and hematuria.  Musculoskeletal: Negative for arthralgias and myalgias.  Skin: Positive for rash.  Allergic/Immunologic: Positive for immunocompromised state (DM2).  Neurological: Negative for weakness and numbness.  Psychiatric/Behavioral: Negative for confusion.   All other systems reviewed and are negative for acute change except as noted in the HPI.    Physical Exam Updated Vital Signs BP (!) 153/75   Pulse 67   Temp 98 F (36.7 C) (Oral)   Resp 16   SpO2  100%   Physical Exam Vitals signs and nursing note reviewed.  Constitutional:      General: He is not in acute distress.    Appearance: Normal appearance. He is well-developed. He is not toxic-appearing.     Comments: Afebrile, nontoxic, NAD  HENT:     Head: Normocephalic and atraumatic.  Eyes:     General:        Right eye: No discharge.        Left eye: No discharge.     Conjunctiva/sclera: Conjunctivae normal.  Neck:     Musculoskeletal: Normal range of motion and neck supple.  Cardiovascular:     Rate and Rhythm: Normal rate.     Pulses: Normal pulses.  Pulmonary:     Effort: Pulmonary effort is normal. No respiratory distress.  Abdominal:     General: There is no distension.  Musculoskeletal: Normal range of motion.  Skin:  General: Skin is warm and dry.     Findings: Lesion and rash present.     Comments: B/l arms and legs and L torso covered with scabbed over sores, several areas of tense fluid filled blisters that don't easily rupture, some areas that have ruptured and scabbed over, others have serous fluid overtop of them, non tender, erythematous around the sores but no warmth or purulent drainage. No nikolsky's sign. No surrounding cellulitis to the areas. SEE PICTURES BELOW  Neurological:     Mental Status: He is alert and oriented to person, place, and time.     Sensory: Sensation is intact. No sensory deficit.     Motor: Motor function is intact.  Psychiatric:        Mood and Affect: Mood and affect normal.        Behavior: Behavior normal.            ED Treatments / Results  Labs (all labs ordered are listed, but only abnormal results are displayed) Labs Reviewed - No data to display  EKG None  Radiology No results found.  Procedures Procedures (including critical care time)  Medications Ordered in ED Medications - No data to display   Initial Impression / Assessment and Plan / ED Course  I have reviewed the triage vital signs and the  nursing notes.  Pertinent labs & imaging results that were available during my care of the patient were reviewed by me and considered in my medical decision making (see chart for details).     58 y.o. male here with rash to b/l arms, torso, and legs x3 weeks. On exam, tense fluid filled blisters that don't easily rupture, some areas that have ruptured and scabbed over, others have serous fluid overtop of them, non tender, erythematous around the sores but no warmth or purulent drainage. No nikolsky's sign. Appears to be contact dermatitis vs bullous pemphigoid vs other nonspecific skin rash. Already on topical bacitracin, triamcinolone, and prednisone and permethrin. Will start on doxycycline to treat for any infectious etiology, although no evidence of cellulitis at this time; will also give vistaril and advised f/up with dermatology. Discussed case with my attending Dr. Eulis Foster who agrees with plan. I explained the diagnosis and have given explicit precautions to return to the ER including for any other new or worsening symptoms. The patient understands and accepts the medical plan as it's been dictated and I have answered their questions. Discharge instructions concerning home care and prescriptions have been given. The patient is STABLE and is discharged to home in good condition.    Final Clinical Impressions(s) / ED Diagnoses   Final diagnoses:  Rash  Blisters of multiple sites    ED Discharge Orders         Ordered    doxycycline (VIBRAMYCIN) 100 MG capsule  2 times daily     05/07/18 1409    hydrOXYzine (ATARAX/VISTARIL) 25 MG tablet  Every 6 hours PRN     05/07/18 [address] [place], [place] 05/07/18 1414    Daleen Bo, MD 05/07/18 1517

## 2018-05-07 NOTE — ED Provider Notes (Signed)
  Face-to-face evaluation   History: He presents for evaluation of pruritic rash present for 3 weeks despite being treated for scabies, topical dermatitis, and wound infection.  He is also on prednisone.  Not currently taking the antihistamine or oral antibiotic.  No known cause, and no prior similar problem.  Physical exam: Otherwise excoriated rash, nonspecific pattern, but all are in areas of reach of hand, and none on the posterior thorax.  Scattered areas of blisters (small and irregular), indistinct appearance, without associated drainage or bleeding.  MDM-specific rash most likely impetigo.  Doubt drug reaction or allergic dermatitis.  Possible scabies although currently being treated for that.  Patient will likely need dermatology follow-up for biopsy and ongoing management.  He is nontoxic today and does not need to be admitted.  Medical screening examination/treatment/procedure(s) were conducted as a shared visit with non-physician practitioner(s) and myself.  I personally evaluated the patient during the encounter    Daleen Bo, MD 05/07/18 [id number]

## 2018-05-07 NOTE — ED Notes (Signed)
[facility] called to transport pt to [facility]

## 2018-05-07 NOTE — Discharge Instructions (Addendum)
Continue using all home medications. Start taking antibiotic as directed until completed. Use vistaril as directed as needed for itching. Avoid scratching the areas or popping the blisters. Avoid sun exposure to the areas. Follow up with the dermatologist in 5-7 days for recheck of symptoms. Return to the ER for emergent changes or worsening symptoms.

## 2018-05-09 DIAGNOSIS — I10 Essential (primary) hypertension: Secondary | ICD-10-CM | POA: Diagnosis not present

## 2018-05-09 DIAGNOSIS — E785 Hyperlipidemia, unspecified: Secondary | ICD-10-CM | POA: Diagnosis not present

## 2018-05-09 DIAGNOSIS — I639 Cerebral infarction, unspecified: Secondary | ICD-10-CM | POA: Diagnosis not present

## 2018-05-09 DIAGNOSIS — L309 Dermatitis, unspecified: Secondary | ICD-10-CM | POA: Diagnosis not present

## 2018-05-09 DIAGNOSIS — I69354 Hemiplegia and hemiparesis following cerebral infarction affecting left non-dominant side: Secondary | ICD-10-CM | POA: Diagnosis not present

## 2018-05-11 ENCOUNTER — Other Ambulatory Visit: Payer: Self-pay

## 2018-05-11 ENCOUNTER — Emergency Department (HOSPITAL_COMMUNITY): Payer: Medicare Other

## 2018-05-11 ENCOUNTER — Inpatient Hospital Stay (HOSPITAL_COMMUNITY)
Admission: EM | Admit: 2018-05-11 | Discharge: 2018-05-18 | DRG: 280 | Disposition: A | Discharge status: Skilled Nursing Facility | Payer: Medicare Other | Attending: Cardiology | Admitting: Cardiology

## 2018-05-11 DIAGNOSIS — I252 Old myocardial infarction: Secondary | ICD-10-CM

## 2018-05-11 DIAGNOSIS — I10 Essential (primary) hypertension: Secondary | ICD-10-CM | POA: Diagnosis not present

## 2018-05-11 DIAGNOSIS — R21 Rash and other nonspecific skin eruption: Secondary | ICD-10-CM | POA: Diagnosis not present

## 2018-05-11 DIAGNOSIS — Z801 Family history of malignant neoplasm of trachea, bronchus and lung: Secondary | ICD-10-CM

## 2018-05-11 DIAGNOSIS — L120 Bullous pemphigoid: Secondary | ICD-10-CM | POA: Diagnosis present

## 2018-05-11 DIAGNOSIS — I214 Non-ST elevation (NSTEMI) myocardial infarction: Secondary | ICD-10-CM

## 2018-05-11 DIAGNOSIS — Z794 Long term (current) use of insulin: Secondary | ICD-10-CM | POA: Diagnosis not present

## 2018-05-11 DIAGNOSIS — I5041 Acute combined systolic (congestive) and diastolic (congestive) heart failure: Secondary | ICD-10-CM | POA: Diagnosis not present

## 2018-05-11 DIAGNOSIS — R9431 Abnormal electrocardiogram [ECG] [EKG]: Secondary | ICD-10-CM | POA: Diagnosis not present

## 2018-05-11 DIAGNOSIS — I5021 Acute systolic (congestive) heart failure: Secondary | ICD-10-CM | POA: Diagnosis not present

## 2018-05-11 DIAGNOSIS — J9690 Respiratory failure, unspecified, unspecified whether with hypoxia or hypercapnia: Secondary | ICD-10-CM

## 2018-05-11 DIAGNOSIS — F17210 Nicotine dependence, cigarettes, uncomplicated: Secondary | ICD-10-CM | POA: Diagnosis present

## 2018-05-11 DIAGNOSIS — Z882 Allergy status to sulfonamides status: Secondary | ICD-10-CM | POA: Diagnosis not present

## 2018-05-11 DIAGNOSIS — R4189 Other symptoms and signs involving cognitive functions and awareness: Secondary | ICD-10-CM | POA: Diagnosis present

## 2018-05-11 DIAGNOSIS — I493 Ventricular premature depolarization: Secondary | ICD-10-CM | POA: Diagnosis not present

## 2018-05-11 DIAGNOSIS — I130 Hypertensive heart and chronic kidney disease with heart failure and stage 1 through stage 4 chronic kidney disease, or unspecified chronic kidney disease: Secondary | ICD-10-CM | POA: Diagnosis not present

## 2018-05-11 DIAGNOSIS — M2550 Pain in unspecified joint: Secondary | ICD-10-CM | POA: Diagnosis not present

## 2018-05-11 DIAGNOSIS — Z833 Family history of diabetes mellitus: Secondary | ICD-10-CM | POA: Diagnosis not present

## 2018-05-11 DIAGNOSIS — G459 Transient cerebral ischemic attack, unspecified: Secondary | ICD-10-CM | POA: Diagnosis not present

## 2018-05-11 DIAGNOSIS — J811 Chronic pulmonary edema: Secondary | ICD-10-CM | POA: Diagnosis not present

## 2018-05-11 DIAGNOSIS — E785 Hyperlipidemia, unspecified: Secondary | ICD-10-CM | POA: Diagnosis not present

## 2018-05-11 DIAGNOSIS — E1122 Type 2 diabetes mellitus with diabetic chronic kidney disease: Secondary | ICD-10-CM | POA: Diagnosis not present

## 2018-05-11 DIAGNOSIS — R0602 Shortness of breath: Secondary | ICD-10-CM | POA: Diagnosis not present

## 2018-05-11 DIAGNOSIS — N189 Chronic kidney disease, unspecified: Secondary | ICD-10-CM | POA: Diagnosis not present

## 2018-05-11 DIAGNOSIS — Z7982 Long term (current) use of aspirin: Secondary | ICD-10-CM

## 2018-05-11 DIAGNOSIS — N179 Acute kidney failure, unspecified: Secondary | ICD-10-CM | POA: Diagnosis present

## 2018-05-11 DIAGNOSIS — I255 Ischemic cardiomyopathy: Secondary | ICD-10-CM | POA: Diagnosis not present

## 2018-05-11 DIAGNOSIS — J9601 Acute respiratory failure with hypoxia: Secondary | ICD-10-CM | POA: Diagnosis not present

## 2018-05-11 DIAGNOSIS — R0689 Other abnormalities of breathing: Secondary | ICD-10-CM | POA: Diagnosis not present

## 2018-05-11 DIAGNOSIS — I69354 Hemiplegia and hemiparesis following cerebral infarction affecting left non-dominant side: Secondary | ICD-10-CM | POA: Diagnosis not present

## 2018-05-11 DIAGNOSIS — J810 Acute pulmonary edema: Secondary | ICD-10-CM | POA: Diagnosis not present

## 2018-05-11 DIAGNOSIS — E782 Mixed hyperlipidemia: Secondary | ICD-10-CM | POA: Diagnosis not present

## 2018-05-11 DIAGNOSIS — Z79899 Other long term (current) drug therapy: Secondary | ICD-10-CM | POA: Diagnosis not present

## 2018-05-11 DIAGNOSIS — R531 Weakness: Secondary | ICD-10-CM | POA: Diagnosis not present

## 2018-05-11 DIAGNOSIS — N184 Chronic kidney disease, stage 4 (severe): Secondary | ICD-10-CM | POA: Diagnosis not present

## 2018-05-11 DIAGNOSIS — Z66 Do not resuscitate: Secondary | ICD-10-CM | POA: Diagnosis not present

## 2018-05-11 DIAGNOSIS — E1165 Type 2 diabetes mellitus with hyperglycemia: Secondary | ICD-10-CM | POA: Diagnosis not present

## 2018-05-11 DIAGNOSIS — I2109 ST elevation (STEMI) myocardial infarction involving other coronary artery of anterior wall: Secondary | ICD-10-CM | POA: Diagnosis not present

## 2018-05-11 DIAGNOSIS — K219 Gastro-esophageal reflux disease without esophagitis: Secondary | ICD-10-CM | POA: Diagnosis not present

## 2018-05-11 DIAGNOSIS — I639 Cerebral infarction, unspecified: Secondary | ICD-10-CM | POA: Diagnosis present

## 2018-05-11 DIAGNOSIS — J90 Pleural effusion, not elsewhere classified: Secondary | ICD-10-CM | POA: Diagnosis not present

## 2018-05-11 DIAGNOSIS — Z9181 History of falling: Secondary | ICD-10-CM | POA: Diagnosis not present

## 2018-05-11 DIAGNOSIS — N183 Chronic kidney disease, stage 3 unspecified: Secondary | ICD-10-CM

## 2018-05-11 DIAGNOSIS — N171 Acute kidney failure with acute cortical necrosis: Secondary | ICD-10-CM | POA: Diagnosis not present

## 2018-05-11 DIAGNOSIS — M6281 Muscle weakness (generalized): Secondary | ICD-10-CM | POA: Diagnosis not present

## 2018-05-11 DIAGNOSIS — I249 Acute ischemic heart disease, unspecified: Secondary | ICD-10-CM | POA: Diagnosis not present

## 2018-05-11 DIAGNOSIS — N19 Unspecified kidney failure: Secondary | ICD-10-CM | POA: Diagnosis not present

## 2018-05-11 DIAGNOSIS — Z8249 Family history of ischemic heart disease and other diseases of the circulatory system: Secondary | ICD-10-CM

## 2018-05-11 DIAGNOSIS — I213 ST elevation (STEMI) myocardial infarction of unspecified site: Secondary | ICD-10-CM | POA: Diagnosis not present

## 2018-05-11 DIAGNOSIS — I5043 Acute on chronic combined systolic (congestive) and diastolic (congestive) heart failure: Secondary | ICD-10-CM | POA: Diagnosis present

## 2018-05-11 DIAGNOSIS — Z7401 Bed confinement status: Secondary | ICD-10-CM | POA: Diagnosis not present

## 2018-05-11 DIAGNOSIS — J9621 Acute and chronic respiratory failure with hypoxia: Secondary | ICD-10-CM | POA: Diagnosis present

## 2018-05-11 DIAGNOSIS — J189 Pneumonia, unspecified organism: Secondary | ICD-10-CM | POA: Diagnosis present

## 2018-05-11 DIAGNOSIS — R079 Chest pain, unspecified: Secondary | ICD-10-CM | POA: Diagnosis not present

## 2018-05-11 DIAGNOSIS — I69344 Monoplegia of lower limb following cerebral infarction affecting left non-dominant side: Secondary | ICD-10-CM | POA: Diagnosis not present

## 2018-05-11 DIAGNOSIS — Z811 Family history of alcohol abuse and dependence: Secondary | ICD-10-CM

## 2018-05-11 DIAGNOSIS — E1129 Type 2 diabetes mellitus with other diabetic kidney complication: Secondary | ICD-10-CM | POA: Diagnosis present

## 2018-05-11 DIAGNOSIS — I5020 Unspecified systolic (congestive) heart failure: Secondary | ICD-10-CM | POA: Diagnosis present

## 2018-05-11 DIAGNOSIS — D631 Anemia in chronic kidney disease: Secondary | ICD-10-CM | POA: Diagnosis present

## 2018-05-11 DIAGNOSIS — I5023 Acute on chronic systolic (congestive) heart failure: Secondary | ICD-10-CM | POA: Diagnosis not present

## 2018-05-11 DIAGNOSIS — R262 Difficulty in walking, not elsewhere classified: Secondary | ICD-10-CM | POA: Diagnosis not present

## 2018-05-11 DIAGNOSIS — I25118 Atherosclerotic heart disease of native coronary artery with other forms of angina pectoris: Secondary | ICD-10-CM | POA: Diagnosis present

## 2018-05-11 DIAGNOSIS — S37009A Unspecified injury of unspecified kidney, initial encounter: Secondary | ICD-10-CM

## 2018-05-11 DIAGNOSIS — E876 Hypokalemia: Secondary | ICD-10-CM | POA: Diagnosis not present

## 2018-05-11 DIAGNOSIS — I959 Hypotension, unspecified: Secondary | ICD-10-CM | POA: Diagnosis not present

## 2018-05-11 DIAGNOSIS — Z993 Dependence on wheelchair: Secondary | ICD-10-CM

## 2018-05-11 DIAGNOSIS — F819 Developmental disorder of scholastic skills, unspecified: Secondary | ICD-10-CM | POA: Diagnosis present

## 2018-05-11 DIAGNOSIS — R0989 Other specified symptoms and signs involving the circulatory and respiratory systems: Secondary | ICD-10-CM | POA: Diagnosis not present

## 2018-05-11 DIAGNOSIS — D509 Iron deficiency anemia, unspecified: Secondary | ICD-10-CM | POA: Diagnosis present

## 2018-05-11 DIAGNOSIS — R278 Other lack of coordination: Secondary | ICD-10-CM | POA: Diagnosis not present

## 2018-05-11 DIAGNOSIS — N289 Disorder of kidney and ureter, unspecified: Secondary | ICD-10-CM | POA: Diagnosis not present

## 2018-05-11 DIAGNOSIS — Z823 Family history of stroke: Secondary | ICD-10-CM

## 2018-05-11 DIAGNOSIS — L130 Dermatitis herpetiformis: Secondary | ICD-10-CM | POA: Diagnosis not present

## 2018-05-11 DIAGNOSIS — I2102 ST elevation (STEMI) myocardial infarction involving left anterior descending coronary artery: Secondary | ICD-10-CM | POA: Diagnosis not present

## 2018-05-11 LAB — POCT I-STAT EG7
Bicarbonate: 24.5 mmol/L (ref 20.0–28.0)
Calcium, Ion: 1.17 mmol/L (ref 1.15–1.40)
HCT: 32 % — ABNORMAL LOW (ref 39.0–52.0)
Hemoglobin: 10.9 g/dL — ABNORMAL LOW (ref 13.0–17.0)
O2 Saturation: 78 %
Potassium: 4.8 mmol/L (ref 3.5–5.1)
Sodium: 138 mmol/L (ref 135–145)
TCO2: 26 mmol/L (ref 22–32)
pCO2, Ven: 40.3 mmHg — ABNORMAL LOW (ref 44.0–60.0)
pH, Ven: 7.391 (ref 7.250–7.430)
pO2, Ven: 42 mmHg (ref 32.0–45.0)

## 2018-05-11 LAB — POCT I-STAT 7, (LYTES, BLD GAS, ICA,H+H)
ACID-BASE DEFICIT: 3 mmol/L — AB (ref 0.0–2.0)
Bicarbonate: 22.2 mmol/L (ref 20.0–28.0)
Calcium, Ion: 1.15 mmol/L (ref 1.15–1.40)
HCT: 30 % — ABNORMAL LOW (ref 39.0–52.0)
HEMOGLOBIN: 10.2 g/dL — AB (ref 13.0–17.0)
O2 Saturation: 88 %
PH ART: 7.359 (ref 7.350–7.450)
PO2 ART: 56 mmHg — AB (ref 83.0–108.0)
Potassium: 4.7 mmol/L (ref 3.5–5.1)
Sodium: 140 mmol/L (ref 135–145)
TCO2: 23 mmol/L (ref 22–32)
pCO2 arterial: 39.3 mmHg (ref 32.0–48.0)

## 2018-05-11 LAB — COMPREHENSIVE METABOLIC PANEL
ALT: 34 U/L (ref 0–44)
AST: 24 U/L (ref 15–41)
Albumin: 2.4 g/dL — ABNORMAL LOW (ref 3.5–5.0)
Alkaline Phosphatase: 107 U/L (ref 38–126)
Anion gap: 11 (ref 5–15)
BILIRUBIN TOTAL: 0.7 mg/dL (ref 0.3–1.2)
BUN: 43 mg/dL — ABNORMAL HIGH (ref 6–20)
CO2: 23 mmol/L (ref 22–32)
CREATININE: 2.82 mg/dL — AB (ref 0.61–1.24)
Calcium: 8.5 mg/dL — ABNORMAL LOW (ref 8.9–10.3)
Chloride: 104 mmol/L (ref 98–111)
GFR calc Af Amer: 27 mL/min — ABNORMAL LOW (ref >60)
GFR calc non Af Amer: 23 mL/min — ABNORMAL LOW (ref >60)
Glucose, Bld: 156 mg/dL — ABNORMAL HIGH (ref 70–99)
Potassium: 4.8 mmol/L (ref 3.5–5.1)
Sodium: 138 mmol/L (ref 135–145)
Total Protein: 7 g/dL (ref 6.5–8.1)

## 2018-05-11 LAB — CBC
HCT: 32.4 % — ABNORMAL LOW (ref 39.0–52.0)
Hemoglobin: 10.2 g/dL — ABNORMAL LOW (ref 13.0–17.0)
MCH: 28.6 pg (ref 26.0–34.0)
MCHC: 31.5 g/dL (ref 30.0–36.0)
MCV: 90.8 fL (ref 80.0–100.0)
Platelets: 242 10*3/uL (ref 150–400)
RBC: 3.57 MIL/uL — ABNORMAL LOW (ref 4.22–5.81)
RDW: 13.2 % (ref 11.5–15.5)
WBC: 16.2 10*3/uL — ABNORMAL HIGH (ref 4.0–10.5)
nRBC: 0 % (ref 0.0–0.2)

## 2018-05-11 LAB — I-STAT TROPONIN, ED
Troponin i, poc: 0.62 ng/mL (ref 0.00–0.08)
Troponin i, poc: 2.92 ng/mL (ref 0.00–0.08)

## 2018-05-11 LAB — D-DIMER, QUANTITATIVE: D-Dimer, Quant: 7.99 ug/mL-FEU — ABNORMAL HIGH (ref 0.00–0.50)

## 2018-05-11 LAB — LACTIC ACID, PLASMA: Lactic Acid, Venous: 1.5 mmol/L (ref 0.5–1.9)

## 2018-05-11 MED ORDER — SODIUM CHLORIDE 0.9 % IV SOLN
1.0000 g | Freq: Once | INTRAVENOUS | Status: AC
  Administered 2018-05-11: 1 g via INTRAVENOUS
  Filled 2018-05-11: qty 1

## 2018-05-11 MED ORDER — ONDANSETRON HCL 4 MG/2ML IJ SOLN
4.0000 mg | Freq: Four times a day (QID) | INTRAMUSCULAR | Status: DC | PRN
  Administered 2018-05-18: 4 mg via INTRAVENOUS
  Filled 2018-05-11: qty 2

## 2018-05-11 MED ORDER — ASPIRIN 81 MG PO CHEW
81.0000 mg | CHEWABLE_TABLET | Freq: Every day | ORAL | Status: DC
  Administered 2018-05-12 – 2018-05-18 (×7): 81 mg via ORAL
  Filled 2018-05-11 (×7): qty 1

## 2018-05-11 MED ORDER — ATORVASTATIN CALCIUM 80 MG PO TABS
80.0000 mg | ORAL_TABLET | Freq: Every day | ORAL | Status: DC
  Administered 2018-05-12 – 2018-05-18 (×8): 80 mg via ORAL
  Filled 2018-05-11 (×8): qty 1

## 2018-05-11 MED ORDER — FUROSEMIDE 10 MG/ML IJ SOLN
40.0000 mg | Freq: Once | INTRAMUSCULAR | Status: AC
  Administered 2018-05-11: 40 mg via INTRAVENOUS
  Filled 2018-05-11: qty 4

## 2018-05-11 MED ORDER — SODIUM CHLORIDE 0.9 % IV SOLN
1.0000 g | Freq: Every day | INTRAVENOUS | Status: DC
  Administered 2018-05-12: 1 g via INTRAVENOUS
  Filled 2018-05-11: qty 1

## 2018-05-11 MED ORDER — VANCOMYCIN HCL IN DEXTROSE 750-5 MG/150ML-% IV SOLN
750.0000 mg | INTRAVENOUS | Status: DC
  Administered 2018-05-12: 750 mg via INTRAVENOUS
  Filled 2018-05-11: qty 150

## 2018-05-11 MED ORDER — PANTOPRAZOLE SODIUM 40 MG PO TBEC
40.0000 mg | DELAYED_RELEASE_TABLET | Freq: Every day | ORAL | Status: DC
  Administered 2018-05-12 – 2018-05-18 (×7): 40 mg via ORAL
  Filled 2018-05-11 (×7): qty 1

## 2018-05-11 MED ORDER — ONDANSETRON HCL 4 MG/2ML IJ SOLN
4.0000 mg | Freq: Once | INTRAMUSCULAR | Status: AC
  Administered 2018-05-11: 4 mg via INTRAVENOUS
  Filled 2018-05-11: qty 2

## 2018-05-11 MED ORDER — VANCOMYCIN HCL 10 G IV SOLR
1500.0000 mg | Freq: Once | INTRAVENOUS | Status: AC
  Administered 2018-05-11: 1500 mg via INTRAVENOUS
  Filled 2018-05-11: qty 1500

## 2018-05-11 MED ORDER — HEPARIN (PORCINE) 25000 UT/250ML-% IV SOLN
1800.0000 [IU]/h | INTRAVENOUS | Status: DC
  Administered 2018-05-11: 1000 [IU]/h via INTRAVENOUS
  Administered 2018-05-12: 1300 [IU]/h via INTRAVENOUS
  Administered 2018-05-13 – 2018-05-14 (×4): 1800 [IU]/h via INTRAVENOUS
  Filled 2018-05-11 (×6): qty 250

## 2018-05-11 MED ORDER — HEPARIN BOLUS VIA INFUSION
4000.0000 [IU] | Freq: Once | INTRAVENOUS | Status: AC
  Administered 2018-05-11: 4000 [IU] via INTRAVENOUS
  Filled 2018-05-11: qty 4000

## 2018-05-11 MED ORDER — ASPIRIN 81 MG PO CHEW: 324.0000 mg | CHEWABLE_TABLET | Freq: Once | ORAL | Status: DC

## 2018-05-11 MED ORDER — SODIUM CHLORIDE 0.9 % IV BOLUS
1000.0000 mL | Freq: Once | INTRAVENOUS | Status: AC
  Administered 2018-05-11: 1000 mL via INTRAVENOUS

## 2018-05-11 MED ORDER — CLOPIDOGREL BISULFATE 300 MG PO TABS
300.0000 mg | ORAL_TABLET | Freq: Once | ORAL | Status: AC
  Administered 2018-05-12: 300 mg via ORAL
  Filled 2018-05-11: qty 1

## 2018-05-11 MED ORDER — CLOPIDOGREL BISULFATE 75 MG PO TABS
75.0000 mg | ORAL_TABLET | Freq: Every day | ORAL | Status: DC
  Administered 2018-05-12 – 2018-05-18 (×7): 75 mg via ORAL
  Filled 2018-05-11 (×7): qty 1

## 2018-05-11 MED ORDER — IPRATROPIUM-ALBUTEROL 0.5-2.5 (3) MG/3ML IN SOLN
3.0000 mL | Freq: Once | RESPIRATORY_TRACT | Status: AC
  Administered 2018-05-11: 3 mL via RESPIRATORY_TRACT
  Filled 2018-05-11: qty 3

## 2018-05-11 MED ORDER — MORPHINE SULFATE (PF) 4 MG/ML IV SOLN
4.0000 mg | Freq: Once | INTRAVENOUS | Status: AC
  Administered 2018-05-11: 4 mg via INTRAVENOUS
  Filled 2018-05-11: qty 1

## 2018-05-11 NOTE — Consult Note (Deleted)
Cardiology Consultation:   Patient ID: [name] MRN: [id number]; DOB: September 30, 1960  Admit date: 05/11/2018 Date of Consult: 05/11/2018  Primary Care Provider: Marin Olp, MD Primary Cardiologist: No primary care provider on file.  Primary Electrophysiologist:  None    Patient Profile:   William [name] is a 58 y.o. male former smoker with presumed ICM (EF 30-35%), HTN, poorly controlled DM (last a1c 9.2%), HTN, CVA resulting in LE weakness, cognitive impairment (prior to stroke independent in ADLs) who presents with hypotension and hypoxia, found to have ST elevations and elevated cardiac biomarkers.   History of Present Illness:   Per Mr. Climer family, he was last in his baseline state of health (mostly bedbound due to LE weakness from stroke), with significant learning disability and cognitive impairment at the time of their visit yesterday. When his sister arrived this afternoon, she found him in bed, lethargic appearing. EMS was activated, and found the patient to be hypotensive to the 80s/60s and hypoxic to 61% on room air. He was complaining of "heartburn" and vomited x 2.  Upon arrival to the ER, he was normotensive to 127/78 with SpO2 95% on NRB. Initial blood gas 7.35/38/56. Labs notable for lactate of 1.5, Cr of 2.8 (up from 1.6 prior), albumin 2.3. D-dimer was also elevated at 7.99. EKG demonstrated ST elevations in V2-V4 which were new from prior EKG in 08/2017 and initial troponin elevated to 0.62. CT chest w/out contrast performed demonstrated multivessel calcification of the coronary arteries as well as bilateral pleural effusions with diffuse interstitial opacities and Kerley B lines suggestive of pulmonary edema.   At the time of my evaluation patient was without "heartburn". According to patient's sister he has been experiencing intermittent heartburns x weeks which is a new symptom for him.   With regards to his cardiomyopathy, it appears that it was  initially diagnosed in 08/2017 in the setting of his CVA. Regional wall motion abnormalities were suggestive of ischemic disease.   Past Medical History:  Diagnosis Date  . CEREBROVASCULAR ACCIDENT, HX OF 09/28/2006   2007 affected left side and peripheral vision  . DIABETES MELLITUS, TYPE II 09/28/2006  . GERD (gastroesophageal reflux disease)   . HYPERLIPIDEMIA 09/28/2006  . HYPERTENSION 09/28/2006  . Learning disability   . Stroke [facility] years ago    Past Surgical History:  Procedure Laterality Date  . COLONOSCOPY    . LYMPHADENECTOMY    . RHINOPLASTY    . SIGMOIDOSCOPY       Home Medications:  Prior to Admission medications   Medication Sig Start Date End Date Taking? Authorizing Provider  acetaminophen (TYLENOL) 325 MG tablet Take 650 mg by mouth every 6 (six) hours as needed for fever.   Yes [provider]  amLODipine (NORVASC) 10 MG tablet Take 10 mg by mouth daily.  08/25/17  Yes [provider]  aspirin 325 MG tablet Take 1 tablet (325 mg total) by mouth daily. 08/24/17  Yes Domenic Polite, MD  atorvastatin (LIPITOR) 20 MG tablet TAKE 1 TABLET BY MOUTH  DAILY 04/22/17  Yes Marin Olp, MD  buPROPion (WELLBUTRIN XL) 150 MG 24 hr tablet Take 1 tablet (150 mg total) by mouth daily. 04/23/17  Yes Marin Olp, MD  doxycycline (VIBRAMYCIN) 100 MG capsule Take 1 capsule (100 mg total) by mouth 2 (two) times daily. One po bid x 7 days 05/07/18  Yes Street, [place], PA-C  escitalopram (LEXAPRO) 10 MG tablet Take 10 mg by mouth daily.  Yes [provider]  ferrous sulfate 325 (65 FE) MG tablet Take 1 tablet (325 mg total) by mouth 2 (two) times daily with a meal. Patient taking differently: Take 325 mg by mouth daily with breakfast.  03/24/17  Yes Marin Olp, MD  hydrOXYzine (ATARAX/VISTARIL) 25 MG tablet Take 1 tablet (25 mg total) by mouth every 6 (six) hours as needed for itching. 05/07/18  Yes Street, [address], PA-C  Hypromellose (ARTIFICIAL  TEARS) 0.4 % SOLN Apply 2 drops to eye 2 (two) times daily.   Yes [provider]  insulin aspart (NOVOLOG PENFILL) cartridge Inject 20-24 Units into the skin 3 (three) times daily before meals. 20 units in the morning. 24 units in the afternoon. 20 units in the evening 11/24/17  Yes [provider]  Insulin Glargine (LANTUS SOLOSTAR) 100 UNIT/ML Solostar Pen Inject 62 Units into the skin at bedtime.  11/17/17  Yes [provider]  labetalol (NORMODYNE) 200 MG tablet Take 200 mg by mouth 2 (two) times daily. 08/31/17  Yes [provider]  linagliptin (TRADJENTA) 5 MG TABS tablet Take 5 mg by mouth daily. 09/27/17  Yes [provider]  losartan-hydrochlorothiazide (HYZAAR) 100-25 MG tablet Take 1 tablet by mouth daily.   Yes [provider]  omeprazole (PRILOSEC) 20 MG capsule TAKE 1 CAPSULE BY MOUTH  DAILY 07/29/17  Yes Marin Olp, MD  simethicone (MYLICON) 80 MG chewable tablet Chew 160 mg by mouth 3 (three) times daily as needed for flatulence.   Yes [provider]  escitalopram (LEXAPRO) 5 MG tablet Take 1 tablet (5 mg total) by mouth daily. Patient not taking: Reported on 05/11/2018 07/05/17   Briscoe Deutscher, DO   Inpatient Medications: Scheduled Meds: . furosemide  40 mg Intravenous Once   Continuous Infusions: . ceFEPime (MAXIPIME) IV    . heparin 1,000 Units/hr (05/11/18 1923)  . vancomycin    . [START ON 05/12/2018] vancomycin      Allergies:    Allergies  Allergen Reactions  . Sulfamethoxazole     REACTION: rash    Social History:   Social History   Socioeconomic History  . Marital status: Divorced    Spouse name: Not on file  . Number of children: Not on file  . Years of education: Not on file  . Highest education level: Not on file  Occupational History  . Not on file  Social Needs  . Financial resource strain: Not hard at all  . Food insecurity:    Worry: Never true    Inability: Never true  .  Transportation needs:    Medical: No    Non-medical: No  Tobacco Use  . Smoking status: Current Every Day Smoker    Packs/day: 1.00    Types: Cigarettes  . Smokeless tobacco: Former Network engineer and Sexual Activity  . Alcohol use: No    Alcohol/week: 0.0 standard drinks  . Drug use: No  . Sexual activity: Not on file  Lifestyle  . Physical activity:    Days per week: 0 days    Minutes per session: 0 min  . Stress: Not at all  Relationships  . Social connections:    Talks on phone: Three times a week    Gets together: Three times a week    Attends religious service: Never    Active member of club or organization: No    Attends meetings of clubs or organizations: Never    Relationship status: Divorced  . Intimate  partner violence:    Fear of current or ex partner: No    Emotionally abused: No    Physically abused: No    Forced sexual activity: No  Other Topics Concern  . Not on file  Social History Narrative   Single. Lives alone will be living in a trailer near family friends Otho Perl and husband Renee Rival- will be staying in their trailer). They watch out for him.    Sister is also involved heavily.       Disabled after CVA, low literacy at baseline      Enjoys hunting    Family History:   Family History  Problem Relation Age of Onset  . Lung cancer Mother   . Diabetes Father   . Stroke Father   . Alcohol abuse Brother   . Diabetes Brother   . Heart attack Sister   . Colon cancer Neg Hx      Review of Systems: [y] = yes, [ ]  = no     General: Weight gain [ ] ; Weight loss [ ] ; Anorexia [ ] ; Fatigue [ ] ; Fever [ ] ; Chills [ ] ; Weakness [ ]    Cardiac: Chest pain/pressure [ y]; Resting SOB [ ] ; Exertional SOB [ ] ; Orthopnea [ ] ; Pedal Edema [ ] ; Palpitations [ ] ; Syncope [ ] ; Presyncope [ ] ; Paroxysmal nocturnal dyspnea[ ]    Pulmonary: Cough Blue.Reese ]; Wheezing[ ] ; Hemoptysis[ ] ; Sputum [ ] ; Snoring [ ]    GI: Vomiting[y ]; Dysphagia[ ] ; Melena[ ] ;  Hematochezia [ ] ; Heartburn[ ] ; Abdominal pain [ ] ; Constipation [ ] ; Diarrhea [ ] ; BRBPR [ ]    GU: Hematuria[ ] ; Dysuria [ ] ; Nocturia[ ]    Vascular: Pain in legs with walking [ ] ; Pain in feet with lying flat [ ] ; Non-healing sores [ ] ; Stroke [ ] ; TIA [ ] ; Slurred speech [ ] ;   Neuro: Headaches[ ] ; Vertigo[ ] ; Seizures[ ] ; Paresthesias[ ] ;Blurred vision [ ] ; Diplopia [ ] ; Vision changes [ ]    Ortho/Skin: Arthritis [ ] ; Joint pain [ ] ; Muscle pain [ ] ; Joint swelling [ ] ; Back Pain [ ] ; Rash [ ]    Psych: Depression[ ] ; Anxiety[ ]    Heme: Bleeding problems [ ] ; Clotting disorders [ ] ; Anemia [ ]    Endocrine: Diabetes [ ] ; Thyroid dysfunction[ ]   Physical Exam/Data:   Vitals:   05/11/18 1845 05/11/18 1902 05/11/18 1908 05/11/18 2047  BP: 118/67     Pulse: 78   83  Resp: (!) 22   (!) 28  Temp:  98.6 F (37 C)    TempSrc:  Rectal    SpO2: (!) 88%   93%  Weight:  81.6 kg 81.6 kg   Height:   5\' 8"  (1.727 m)    No intake or output data in the 24 hours ending 05/11/18 2129 Filed Weights   05/11/18 1902 05/11/18 1908  Weight: 81.6 kg 81.6 kg   Body mass index is 27.37 kg/m.  General:  Well nourished, well developed, tachypneic to the 30s on bipap.  HEENT: normal Lymph: no adenopathy Neck: JVD difficult to appreciate given tachypnea.  Endocrine:  No thryomegaly Vascular: No carotid bruits; FA pulses 2+ bilaterally without bruits. 2+ R radial pulse.  Cardiac:  normal S1, S2; RRR; systolic murmur at LLSB.  Lungs:  Bibasilar crackles to mid lung.  Abd: soft, nontender, no hepatomegaly  Ext: warm well perfused. No edema.  Musculoskeletal:  No deformities, BUE and BLE strength normal and equal Skin: warm and  dry. Blistering reticular rash on trunk and extremities.  Neuro:  CNs 2-12 intact, no focal abnormalities noted Psych:  Normal affect   EKG:  The EKG was personally reviewed and demonstrates:  ST elevations in V3-V6 that are new from prior. Q waves (old) anteriorly.    Relevant CV Studies: TTE 08/2017: Study Conclusions  - Left ventricle: Septal apical and inferior wall hypokinesis   regionality suggests CAD. The cavity size was severely dilated.   Wall thickness was increased in a pattern of severe LVH. Systolic   function was moderately to severely reduced. The estimated   ejection fraction was in the range of 30% to 35%. Doppler   parameters are consistent with both elevated ventricular   end-diastolic filling pressure and elevated left atrial filling   pressure. - Atrial septum: No defect or patent foramen ovale was identified.  Laboratory Data:  Chemistry Recent Labs  Lab 05/11/18 1820 05/11/18 1833 05/11/18 2054  NA 138 138 140  K 4.8 4.8 4.7  CL 104  --   --   CO2 23  --   --   GLUCOSE 156*  --   --   BUN 43*  --   --   CREATININE 2.82*  --   --   CALCIUM 8.5*  --   --   GFRNONAA 23*  --   --   GFRAA 27*  --   --   ANIONGAP 11  --   --     Recent Labs  Lab 05/11/18 1820  PROT 7.0  ALBUMIN 2.4*  AST 24  ALT 34  ALKPHOS 107  BILITOT 0.7   Hematology Recent Labs  Lab 05/11/18 1820 05/11/18 1833 05/11/18 2054  WBC 16.2*  --   --   RBC 3.57*  --   --   HGB 10.2* 10.9* 10.2*  HCT 32.4* 32.0* 30.0*  MCV 90.8  --   --   MCH 28.6  --   --   MCHC 31.5  --   --   RDW 13.2  --   --   PLT 242  --   --    Cardiac EnzymesNo results for input(s): TROPONINI in the last 168 hours.  Recent Labs  Lab 05/11/18 1829  TROPIPOC 0.62*    BNPNo results for input(s): BNP, PROBNP in the last 168 hours.  DDimer  Recent Labs  Lab 05/11/18 1820  DDIMER 7.99*    Radiology/Studies:  Ct Chest Wo Contrast  Result Date: 05/11/2018 CLINICAL DATA:  58 year old male with hypoxia. EXAM: CT CHEST WITHOUT CONTRAST TECHNIQUE: Multidetector CT imaging of the chest was performed following the standard protocol without IV contrast. COMPARISON:  Chest radiograph dated 05/11/2018 FINDINGS: Evaluation of this exam is limited in the absence of  intravenous contrast. Cardiovascular: There is mild cardiomegaly. No significant pericardial effusion. There is multi vessel coronary vascular calcification. There is hypoattenuation of the cardiac blood pool suggestive of a degree of anemia. Clinical correlation is recommended. There is moderate atherosclerotic calcification of the thoracic aorta. The central pulmonary arteries are grossly unremarkable. Mediastinum/Nodes: Top-normal paratracheal lymph nodes measure up to 10 mm in short axis. Subcarinal lymph node measures 17 mm in short axis. The esophagus is grossly unremarkable. No mediastinal fluid collection. Lungs/Pleura: There are small bilateral pleural effusions. There is diffuse interstitial and interlobular septal prominence with Kerley B-lines as well as patchy areas of central and perihilar ground-glass density. Findings most consistent with pulmonary edema with probable superimposed pneumonia. Clinical correlation is recommended.  There is no pneumothorax. The central airways are patent. Upper Abdomen: There are stones within the gallbladder. There is slight irregularity of the liver contour which may represent early changes of cirrhosis. Clinical correlation is recommended. Musculoskeletal: Degenerative changes of the spine. No acute osseous pathology. IMPRESSION: 1. Findings most consistent with pulmonary edema with probable superimposed pneumonia. Clinical correlation is recommended. 2. Small bilateral pleural effusions. 3. Cholelithiasis. Electronically Signed   By: Anner Crete M.D.   On: 05/11/2018 19:59   Dg Chest Port 1 View  Result Date: 05/11/2018 CLINICAL DATA:  Shortness of breath, weakness, and vomiting. EXAM: PORTABLE CHEST 1 VIEW COMPARISON:  05/25/2017 FINDINGS: Mild cardiomegaly. Moderate diffuse interstitial infiltrates with Kerley B-lines, consistent with diffuse interstitial edema. No evidence of focal consolidation or significant pleural effusion. IMPRESSION: Cardiomegaly  and diffuse interstitial edema, consistent with congestive heart failure. Electronically Signed   By: Earle Gell M.D.   On: 05/11/2018 18:47    Assessment and Plan:   Mr. Berthelot is a 58 year old man with HTN, poorly controlled DM, HLD who presents with hypoxemic respiratory failure and elevated cardiac biomarkers concerning for ACS. Suspect his "heartburn" over the preceding weeks was an anginal equivalent. He clearly has multivessel CAD based upon CT scan and presumed ischemic myopathy with EF 30-35% and regional wall motion abnormalities. Thus, I am concerned that he has had an interval ACS event that has resulted in his clinical presentation. PE possible, but EKG and CT chest more suggestive of ACS.  I discussed the patient's history, EKG, and uptrending biomarkers with Dr. Martinique (on call interventionalist) who deferred cardiac catheterization tonight.   #ACS -- ASA 81mg  daily -- INCREASE atorvastatin to high dose (80mg  daily) -- Please recheck a1c and lipid panel to risk stratify -- Continue heparin gtt (would treat to therapeutic ptt goal given pending VQ scan to r/o PE).  -- HOLD beta blocker given pulmonary edema -- Serial troponins and EKGs overnight -- If recurrent "heartburn" please trial SLN if blood pressure will allow.   #Hypoxemic Respiratory Failure #Acute on Chronic Systolic Heart Failure -- Agree with diuresis 40 IV lasix x 1 -- Goal net negative 1-2 L today given respiratory status.  -- Consider foley placement given incontinence.  -- Please monitor I/Os strictly with daily weights.  -- Wean bipap as tolerated.  -- Agree with treatment for PNA  #DM -- Consider SGLT2i given HF and DM prior to discharge (will need to r/o significant PAD given history).   #AKI on CKD -- Please send urinalysis -- Consider renal ultrasound to r/o obstruction  #HTN -- Given CHF and AKI, would favor hydralazine + nitrates for BP control for now  -- Hold Losartan - HCTZ given renal  function -- Would d/c amlodipine in favor of guideline directed medical therapy for HF.   For questions or updates, please contact [place] Please consult [email or web address] for contact info under   Signed, Milus Banister, MD  05/11/2018 9:29 PM

## 2018-05-11 NOTE — ED Provider Notes (Signed)
[facility] EMERGENCY DEPARTMENT Provider Note   CSN: [id number] Arrival date & time: 05/11/18  1803     History   Chief Complaint Chief Complaint  Patient presents with  . Emesis  . Hypotension    HPI William [name] is a 58 y.o. male.  The history is provided by the patient, the nursing home and medical records. No language interpreter was used.  Emesis    58 year old male with history of diabetes, hypertension, hyperlipidemia, prior stroke brought here via EMS from [place] for evaluation of altered mental status.  Patient is a poor historian.  He mentioned that he felt fine earlier but then midday today he felt very nauseous, vomited twice, and that he could not remember much of anything else until he found himself to be in the ER.  He does not have any specific complaint.  Does not complain of any headache, URI symptoms, neck pain, chest pain, trouble breathing, coughing, abdominal pain, diarrhea, constipation, dysuria, focal numbness or weakness.  However, patient is a poor historian therefore level 5 caveats applied.  Per EMS note, patient was found to be hypotensive at 80/60 by nursing staff as well as was lethargic.  When EMS arrived, his blood pressure was 110/70.  However patient was found to be hypoxic with initial O2 sats at 61%.  He is not on home oxygen.  Past Medical History:  Diagnosis Date  . CEREBROVASCULAR ACCIDENT, HX OF 09/28/2006   2007 affected left side and peripheral vision  . DIABETES MELLITUS, TYPE II 09/28/2006  . GERD (gastroesophageal reflux disease)   . HYPERLIPIDEMIA 09/28/2006  . HYPERTENSION 09/28/2006  . Learning disability   . Stroke [facility]) years ago    Patient Active Problem List   Diagnosis Date Noted  . Hypertension associated with stage 3 chronic kidney disease due to type 2 diabetes mellitus ([place]) 02/04/2018  . Hypertension associated with diabetes ([place]) 08/31/2017  . Hemiparesis affecting left side as late  effect of cerebrovascular accident ([place]) 08/31/2017  . Dyslipidemia associated with type 2 diabetes mellitus ([place]) 08/31/2017  . Type 2 diabetes mellitus with stage 3 chronic kidney disease, with long-term current use of insulin ([place]) 08/31/2017  . Stage 3 chronic renal impairment associated with type 2 diabetes mellitus ([place]) 08/31/2017  . Anemia of chronic disease 08/31/2017  . Stroke (cerebrum) ([place]) 08/19/2017  . Frequent falls   . Urinary incontinence   . Abnormal gait 07/14/2017  . Depression, major, single episode, mild ([place]) 04/23/2017  . Inactivity 02/09/2017  . Iron deficiency anemia 06/25/2015  . CKD (chronic kidney disease), stage III ([place]) 04/30/2014  . Tobacco use disorder 11/21/2013  . GERD without esophagitis 09/25/2013  . Type II diabetes mellitus with renal manifestations ([place]) 09/28/2006  . Hyperlipidemia 09/28/2006  . Essential hypertension 09/28/2006  . History of cardiovascular disorder 09/28/2006    Past Surgical History:  Procedure Laterality Date  . COLONOSCOPY    . LYMPHADENECTOMY    . RHINOPLASTY    . SIGMOIDOSCOPY          Home Medications    Prior to Admission medications   Medication Sig Start Date End Date Taking? Authorizing Provider  amLODipine (NORVASC) 10 MG tablet Take 10 mg by mouth daily.  08/25/17   [provider]  aspirin 325 MG tablet Take 1 tablet (325 mg total) by mouth daily. 08/24/17   Domenic Polite, MD  atorvastatin (LIPITOR) 20 MG tablet TAKE 1 TABLET BY MOUTH  DAILY 04/22/17  Marin Olp, MD  buPROPion (WELLBUTRIN XL) 150 MG 24 hr tablet Take 1 tablet (150 mg total) by mouth daily. 04/23/17   Marin Olp, MD  doxycycline (VIBRAMYCIN) 100 MG capsule Take 1 capsule (100 mg total) by mouth 2 (two) times daily. One po bid x 7 days 05/07/18   Street, [place], PA-C  escitalopram (LEXAPRO) 5 MG tablet Take 1 tablet (5 mg total) by mouth daily. 07/05/17   Briscoe Deutscher, DO  ferrous sulfate 325 (65 FE) MG tablet Take 1  tablet (325 mg total) by mouth 2 (two) times daily with a meal. 03/24/17   Marin Olp, MD  hydrOXYzine (ATARAX/VISTARIL) 25 MG tablet Take 1 tablet (25 mg total) by mouth every 6 (six) hours as needed for itching. 05/07/18   Street, [place], PA-C  insulin aspart (NOVOLOG PENFILL) cartridge Inject 18 Units into the skin 3 (three) times daily before meals. For CBG equal to or greater than 150  11/24/17   [provider]  Insulin Glargine (LANTUS SOLOSTAR) 100 UNIT/ML Solostar Pen Inject 60 Units into the skin at bedtime.  11/17/17   [provider]  labetalol (NORMODYNE) 200 MG tablet Take 200 mg by mouth 2 (two) times daily. 08/31/17   [provider]  linagliptin (TRADJENTA) 5 MG TABS tablet Take 5 mg by mouth daily. 09/27/17   [provider]  losartan-hydrochlorothiazide (HYZAAR) 100-25 MG tablet Take 1 tablet by mouth daily.    [provider]  omeprazole (PRILOSEC) 20 MG capsule TAKE 1 CAPSULE BY MOUTH  DAILY 07/29/17   Marin Olp, MD    Family History Family History  Problem Relation Age of Onset  . Lung cancer Mother   . Diabetes Father   . Stroke Father   . Alcohol abuse Brother   . Diabetes Brother   . Heart attack Sister   . Colon cancer Neg Hx     Social History Social History   Tobacco Use  . Smoking status: Current Every Day Smoker    Packs/day: 1.00    Types: Cigarettes  . Smokeless tobacco: Former Network engineer Use Topics  . Alcohol use: No    Alcohol/week: 0.0 standard drinks  . Drug use: No     Allergies   Sulfamethoxazole   Review of Systems Review of Systems  Unable to perform ROS: Mental status change  Gastrointestinal: Positive for vomiting.     Physical Exam Updated Vital Signs BP 127/78 (BP Location: Right Arm)   Pulse 79   Temp 98 F (36.7 C) (Oral)   Resp 20   SpO2 95%   Physical Exam Vitals signs and nursing note reviewed.  Constitutional:      General: He is not in acute  distress.    Appearance: He is well-developed.     Comments: Chronically ill-appearing male no acute discomfort.  HENT:     Head: Atraumatic.     Comments: Mouth is dry. Eyes:     Conjunctiva/sclera: Conjunctivae normal.  Neck:     Musculoskeletal: Neck supple. No neck rigidity.  Cardiovascular:     Rate and Rhythm: Normal rate and regular rhythm.     Pulses: Normal pulses.     Heart sounds: Normal heart sounds.  Pulmonary:     Breath sounds: Rales present.     Comments: Tachypneic Abdominal:     Palpations: Abdomen is soft.     Tenderness: There is no abdominal tenderness.  Skin:    Findings: Rash (Diffuse reticular  rash noted to all 4 extremities as well as abdomen and trunk) present.  Neurological:     Mental Status: He is alert.     Comments: Patient is alert but not oriented to situation, place or time      ED Treatments / Results  Labs (all labs ordered are listed, but only abnormal results are displayed) Labs Reviewed  COMPREHENSIVE METABOLIC PANEL - Abnormal; Notable for the following components:      Result Value   Glucose, Bld 156 (*)    BUN 43 (*)    Creatinine, Ser 2.82 (*)    Calcium 8.5 (*)    Albumin 2.4 (*)    GFR calc non Af Amer 23 (*)    GFR calc Af Amer 27 (*)    All other components within normal limits  CBC - Abnormal; Notable for the following components:   WBC 16.2 (*)    RBC 3.57 (*)    Hemoglobin 10.2 (*)    HCT 32.4 (*)    All other components within normal limits  D-DIMER, QUANTITATIVE (NOT AT [facility]) - Abnormal; Notable for the following components:   D-Dimer, Quant 7.99 (*)    All other components within normal limits  I-STAT TROPONIN, ED - Abnormal; Notable for the following components:   Troponin i, poc 0.62 (*)    All other components within normal limits  POCT I-STAT EG7 - Abnormal; Notable for the following components:   pCO2, Ven 40.3 (*)    HCT 32.0 (*)    Hemoglobin 10.9 (*)    All other components within normal limits    POCT I-STAT 7, (LYTES, BLD GAS, ICA,H+H) - Abnormal; Notable for the following components:   pO2, Arterial 56.0 (*)    Acid-base deficit 3.0 (*)    HCT 30.0 (*)    Hemoglobin 10.2 (*)    All other components within normal limits  CULTURE, BLOOD (ROUTINE X 2)  CULTURE, BLOOD (ROUTINE X 2)  MRSA PCR SCREENING  LACTIC ACID, PLASMA  LACTIC ACID, PLASMA  URINALYSIS, ROUTINE W REFLEX MICROSCOPIC  BRAIN NATRIURETIC PEPTIDE  HEPARIN LEVEL (UNFRACTIONATED)  CBC  BLOOD GAS, ARTERIAL  I-STAT VENOUS BLOOD GAS, ED    EKG EKG Interpretation  Date/Time:  Wednesday May 11 2018 18:11:42 EST Ventricular Rate:  79 PR Interval:    QRS Duration: 82 QT Interval:  414 QTC Calculation: 475 R Axis:   14 Text Interpretation:  Sinus rhythm Probable anterior infarct, age indeterminate ST elevation new since previous  Confirmed by Wandra Arthurs [phone]) on 05/11/2018 6:53:05 PM   EKG Interpretation  Date/Time:  Wednesday May 11 2018 21:34:00 EST Ventricular Rate:  81 PR Interval:    QRS Duration: 78 QT Interval:  399 QTC Calculation: 464 R Axis:   14 Text Interpretation:  Sinus rhythm Probable anterior infarct, age indeterminate unchanged since earlier in the day  Confirmed by Wandra Arthurs [phone]) on 05/11/2018 9:38:23 PM     new EKG without changes.    Radiology Ct Chest Wo Contrast  Result Date: 05/11/2018 CLINICAL DATA:  58 year old male with hypoxia. EXAM: CT CHEST WITHOUT CONTRAST TECHNIQUE: Multidetector CT imaging of the chest was performed following the standard protocol without IV contrast. COMPARISON:  Chest radiograph dated 05/11/2018 FINDINGS: Evaluation of this exam is limited in the absence of intravenous contrast. Cardiovascular: There is mild cardiomegaly. No significant pericardial effusion. There is multi vessel coronary vascular calcification. There is hypoattenuation of the cardiac blood pool suggestive of a degree  of anemia. Clinical correlation is recommended. There is  moderate atherosclerotic calcification of the thoracic aorta. The central pulmonary arteries are grossly unremarkable. Mediastinum/Nodes: Top-normal paratracheal lymph nodes measure up to 10 mm in short axis. Subcarinal lymph node measures 17 mm in short axis. The esophagus is grossly unremarkable. No mediastinal fluid collection. Lungs/Pleura: There are small bilateral pleural effusions. There is diffuse interstitial and interlobular septal prominence with Kerley B-lines as well as patchy areas of central and perihilar ground-glass density. Findings most consistent with pulmonary edema with probable superimposed pneumonia. Clinical correlation is recommended. There is no pneumothorax. The central airways are patent. Upper Abdomen: There are stones within the gallbladder. There is slight irregularity of the liver contour which may represent early changes of cirrhosis. Clinical correlation is recommended. Musculoskeletal: Degenerative changes of the spine. No acute osseous pathology. IMPRESSION: 1. Findings most consistent with pulmonary edema with probable superimposed pneumonia. Clinical correlation is recommended. 2. Small bilateral pleural effusions. 3. Cholelithiasis. Electronically Signed   By: Anner Crete M.D.   On: 05/11/2018 19:59   Dg Chest Port 1 View  Result Date: 05/11/2018 CLINICAL DATA:  Shortness of breath, weakness, and vomiting. EXAM: PORTABLE CHEST 1 VIEW COMPARISON:  05/25/2017 FINDINGS: Mild cardiomegaly. Moderate diffuse interstitial infiltrates with Kerley B-lines, consistent with diffuse interstitial edema. No evidence of focal consolidation or significant pleural effusion. IMPRESSION: Cardiomegaly and diffuse interstitial edema, consistent with congestive heart failure. Electronically Signed   By: Earle Gell M.D.   On: 05/11/2018 18:47    Procedures .Critical Care Performed by: Domenic Moras, PA-C Authorized by: Domenic Moras, PA-C   Critical care provider statement:     Critical care time (minutes):  45   Critical care was time spent personally by me on the following activities:  Discussions with consultants, evaluation of patient's response to treatment, examination of patient, ordering and performing treatments and interventions, ordering and review of laboratory studies, ordering and review of radiographic studies, pulse oximetry, re-evaluation of patient's condition, obtaining history from patient or surrogate and review of old charts   (including critical care time)  Medications Ordered in ED Medications  ipratropium-albuterol (DUONEB) 0.5-2.5 (3) MG/3ML nebulizer solution 3 mL (has no administration in time range)  sodium chloride 0.9 % bolus 1,000 mL (has no administration in time range)  ondansetron (ZOFRAN) injection 4 mg (has no administration in time range)     Initial Impression / Assessment and Plan / ED Course  I have reviewed the triage vital signs and the nursing notes.  Pertinent labs & imaging results that were available during my care of the patient were reviewed by me and considered in my medical decision making (see chart for details).     BP 118/67   Pulse 83   Temp 98.6 F (37 C) (Rectal)   Resp (!) 28   Ht 5\' 8"  (1.727 m)   Wt 81.6 kg   SpO2 93%   BMI 27.37 kg/m    Final Clinical Impressions(s) / ED Diagnoses   Final diagnoses:  NSTEMI (non-ST elevated myocardial infarction) [facility])    ED Discharge Orders    None     6:33 PM Patient who is a poor historian here due to nausea vomiting hypotension and suspect syncopal episode.  He is currently not having any specific complaint however he was found to be hypoxic even on 4 L of nasal cannula.  EKG shows ST elevation changes in the anterior leads as well as elevated troponin of 0.62. Dr. Darl Householder was made  aware, who consulted STEMI doctor, Dr. Peter Martinique who did review EKG.  Given unclear history, code STEMI was not activated.  7:05 PM D-dimer is elevated at 7.99.   However, patient has significant worsening renal function than baseline.  BUN 43, creatinine 2.82, and a GFR of 23.  Therefore, unable to obtain chest CT angiogram to rule out PE.  Will obtain chest CT noncontrast as well as VQ scan.  Patient would also be receiving heparin per ACS protocol.  Anticipate admission.   8:33 PM Patient remains hypoxic despite being on nonrebreather.  I have ordered BiPAP.  Venous blood gas unremarkable.  ABG ordered.  Chest CT shows phimosis with pulmonary edema with probable superimposed pneumonia.  Small bilateral pleural effusion noted.  I will provide antibiotic to cover for H CAP.  VQ scan ordered for tomorrow.  Elevated white count of 16.2 could be secondary to infection or could be due to recent steroid.  Will obtain formal consult.  Patient is currently being heparinized.  8:41 PM Appreciate consultation from on-call cardiologist who will see patient in the ER.  Anticipate starting diuretic as patient appears to have pleural effusion and hypoxia requiring BiPAP.  9:14 PM Will consult for admission.  Patient will benefit from serial troponin with serial EKG to assess for any dynamic changes.  Patient given Lasix 40 mg via IV.  9:39 PM Patient reports some heartburn sensation, will give morphine.  Repeat EKG without acute changes.  Currently awaits repeat troponin.  9:52 PM Repeat troponin is elevated to 2.92.  I have reconsulted cardiologist who will admit patient for further management. Pt admit for NSTEMI.     Domenic Moras, PA-C 05/11/18 [id number]    Drenda Freeze, MD 05/11/18 [phone]

## 2018-05-11 NOTE — H&P (Addendum)
Cardiology Consultation:   Patient ID: William [name] MRN: [id number]; DOB: Jul 07, 1960  Admit date: 05/11/2018 Date of Consult: 05/11/2018  Primary Care Provider: Marin Olp, MD Primary Cardiologist: No primary care provider on file.  Primary Electrophysiologist:  None   Contact Info: Sister [phone]; Janett Billow [phone]   Patient Profile:   William [name] is a 58 y.o. male former smoker with presumed ICM (EF 30-35%), HTN, poorly controlled DM (last a1c 9.2%), HTN, CVA resulting in LE weakness, cognitive impairment (prior to stroke independent in ADLs) who presents with hypotension and hypoxia, found to have ST elevations and elevated cardiac biomarkers.   History of Present Illness:   Per Mr. Bleicher family, he was last in his baseline state of health (mostly bedbound due to LE weakness from stroke), with significant learning disability and cognitive impairment at the time of their visit yesterday. When his sister arrived this afternoon, she found him in bed, lethargic appearing. EMS was activated, and found the patient to be hypotensive to the 80s/60s and hypoxic to 61% on room air. He was complaining of "heartburn" and vomited x 2.  Upon arrival to the ER, he was normotensive to 127/78 with SpO2 95% on NRB. Initial blood gas 7.35/38/56. Labs notable for lactate of 1.5, Cr of 2.8 (up from 1.6 prior), albumin 2.3. D-dimer was also elevated at 7.99. EKG demonstrated ST elevations in V2-V4 which were new from prior EKG in 08/2017 and initial troponin elevated to 0.62. CT chest w/out contrast performed demonstrated multivessel calcification of the coronary arteries as well as bilateral pleural effusions with diffuse interstitial opacities and Kerley B lines suggestive of pulmonary edema.   At the time of my evaluation patient was without "heartburn". According to patient's sister he has been experiencing intermittent heartburns x weeks which is a new symptom for him.   With  regards to his cardiomyopathy, it appears that it was initially diagnosed in 08/2017 in the setting of his CVA. Regional wall motion abnormalities were suggestive of ischemic disease.   Past Medical History:  Diagnosis Date  . CEREBROVASCULAR ACCIDENT, HX OF 09/28/2006   2007 affected left side and peripheral vision  . DIABETES MELLITUS, TYPE II 09/28/2006  . GERD (gastroesophageal reflux disease)   . HYPERLIPIDEMIA 09/28/2006  . HYPERTENSION 09/28/2006  . Learning disability   . Stroke [facility]) years ago    Past Surgical History:  Procedure Laterality Date  . COLONOSCOPY    . LYMPHADENECTOMY    . RHINOPLASTY    . SIGMOIDOSCOPY       Home Medications:  Prior to Admission medications   Medication Sig Start Date End Date Taking? Authorizing Provider  acetaminophen (TYLENOL) 325 MG tablet Take 650 mg by mouth every 6 (six) hours as needed for fever.   Yes [provider]  amLODipine (NORVASC) 10 MG tablet Take 10 mg by mouth daily.  08/25/17  Yes [provider]  aspirin 325 MG tablet Take 1 tablet (325 mg total) by mouth daily. 08/24/17  Yes Domenic Polite, MD  atorvastatin (LIPITOR) 20 MG tablet TAKE 1 TABLET BY MOUTH  DAILY 04/22/17  Yes Marin Olp, MD  buPROPion (WELLBUTRIN XL) 150 MG 24 hr tablet Take 1 tablet (150 mg total) by mouth daily. 04/23/17  Yes Marin Olp, MD  doxycycline (VIBRAMYCIN) 100 MG capsule Take 1 capsule (100 mg total) by mouth 2 (two) times daily. One po bid x 7 days 05/07/18  Yes Street, Woodson, PA-C  escitalopram (LEXAPRO) 10 MG tablet  Take 10 mg by mouth daily.   Yes [provider]  ferrous sulfate 325 (65 FE) MG tablet Take 1 tablet (325 mg total) by mouth 2 (two) times daily with a meal. Patient taking differently: Take 325 mg by mouth daily with breakfast.  03/24/17  Yes Marin Olp, MD  hydrOXYzine (ATARAX/VISTARIL) 25 MG tablet Take 1 tablet (25 mg total) by mouth every 6 (six) hours as needed for itching. 05/07/18  Yes  Street, Ruth, PA-C  Hypromellose (ARTIFICIAL TEARS) 0.4 % SOLN Apply 2 drops to eye 2 (two) times daily.   Yes [provider]  insulin aspart (NOVOLOG PENFILL) cartridge Inject 20-24 Units into the skin 3 (three) times daily before meals. 20 units in the morning. 24 units in the afternoon. 20 units in the evening 11/24/17  Yes [provider]  Insulin Glargine (LANTUS SOLOSTAR) 100 UNIT/ML Solostar Pen Inject 62 Units into the skin at bedtime.  11/17/17  Yes [provider]  labetalol (NORMODYNE) 200 MG tablet Take 200 mg by mouth 2 (two) times daily. 08/31/17  Yes [provider]  linagliptin (TRADJENTA) 5 MG TABS tablet Take 5 mg by mouth daily. 09/27/17  Yes [provider]  losartan-hydrochlorothiazide (HYZAAR) 100-25 MG tablet Take 1 tablet by mouth daily.   Yes [provider]  omeprazole (PRILOSEC) 20 MG capsule TAKE 1 CAPSULE BY MOUTH  DAILY 07/29/17  Yes Marin Olp, MD  simethicone (MYLICON) 80 MG chewable tablet Chew 160 mg by mouth 3 (three) times daily as needed for flatulence.   Yes [provider]  escitalopram (LEXAPRO) 5 MG tablet Take 1 tablet (5 mg total) by mouth daily. Patient not taking: Reported on 05/11/2018 07/05/17   Briscoe Deutscher, DO   Inpatient Medications: Scheduled Meds: . furosemide  40 mg Intravenous Once   Continuous Infusions: . ceFEPime (MAXIPIME) IV    . heparin 1,000 Units/hr (05/11/18 1923)  . vancomycin    . [START ON 05/12/2018] vancomycin      Allergies:    Allergies  Allergen Reactions  . Sulfamethoxazole     REACTION: rash    Social History:   Social History   Socioeconomic History  . Marital status: Divorced    Spouse name: Not on file  . Number of children: Not on file  . Years of education: Not on file  . Highest education level: Not on file  Occupational History  . Not on file  Social Needs  . Financial resource strain: Not hard at all  . Food insecurity:     Worry: Never true    Inability: Never true  . Transportation needs:    Medical: No    Non-medical: No  Tobacco Use  . Smoking status: Current Every Day Smoker    Packs/day: 1.00    Types: Cigarettes  . Smokeless tobacco: Former Network engineer and Sexual Activity  . Alcohol use: No    Alcohol/week: 0.0 standard drinks  . Drug use: No  . Sexual activity: Not on file  Lifestyle  . Physical activity:    Days per week: 0 days    Minutes per session: 0 min  . Stress: Not at all  Relationships  . Social connections:    Talks on phone: Three times a week    Gets together: Three times a week    Attends religious service: Never    Active member of club or organization: No    Attends meetings of clubs or organizations: Never  Relationship status: Divorced  . Intimate partner violence:    Fear of current or ex partner: No    Emotionally abused: No    Physically abused: No    Forced sexual activity: No  Other Topics Concern  . Not on file  Social History Narrative   Single. Lives alone will be living in a trailer near family friends Otho Perl and husband Renee Rival- will be staying in their trailer). They watch out for him.    Sister is also involved heavily.       Disabled after CVA, low literacy at baseline      Enjoys hunting    Family History:   Family History  Problem Relation Age of Onset  . Lung cancer Mother   . Diabetes Father   . Stroke Father   . Alcohol abuse Brother   . Diabetes Brother   . Heart attack Sister   . Colon cancer Neg Hx      Review of Systems: [y] = yes, [ ]  = no     General: Weight gain [ ] ; Weight loss [ ] ; Anorexia [ ] ; Fatigue [ ] ; Fever [ ] ; Chills [ ] ; Weakness [ ]    Cardiac: Chest pain/pressure [ y]; Resting SOB [ ] ; Exertional SOB [ ] ; Orthopnea [ ] ; Pedal Edema [ ] ; Palpitations [ ] ; Syncope [ ] ; Presyncope [ ] ; Paroxysmal nocturnal dyspnea[ ]    Pulmonary: Cough Blue.Reese ]; Wheezing[ ] ; Hemoptysis[ ] ; Sputum [ ] ; Snoring [ ]    GI:  Vomiting[y ]; Dysphagia[ ] ; Melena[ ] ; Hematochezia [ ] ; Heartburn[ ] ; Abdominal pain [ ] ; Constipation [ ] ; Diarrhea [ ] ; BRBPR [ ]    GU: Hematuria[ ] ; Dysuria [ ] ; Nocturia[ ]    Vascular: Pain in legs with walking [ ] ; Pain in feet with lying flat [ ] ; Non-healing sores [ ] ; Stroke [ ] ; TIA [ ] ; Slurred speech [ ] ;   Neuro: Headaches[ ] ; Vertigo[ ] ; Seizures[ ] ; Paresthesias[ ] ;Blurred vision [ ] ; Diplopia [ ] ; Vision changes [ ]    Ortho/Skin: Arthritis [ ] ; Joint pain [ ] ; Muscle pain [ ] ; Joint swelling [ ] ; Back Pain [ ] ; Rash [ ]    Psych: Depression[ ] ; Anxiety[ ]    Heme: Bleeding problems [ ] ; Clotting disorders [ ] ; Anemia [ ]    Endocrine: Diabetes [ ] ; Thyroid dysfunction[ ]   Physical Exam/Data:   Vitals:   05/11/18 1845 05/11/18 1902 05/11/18 1908 05/11/18 2047  BP: 118/67     Pulse: 78   83  Resp: (!) 22   (!) 28  Temp:  98.6 F (37 C)    TempSrc:  Rectal    SpO2: (!) 88%   93%  Weight:  81.6 kg 81.6 kg   Height:   5\' 8"  (1.727 m)    No intake or output data in the 24 hours ending 05/11/18 2129 Filed Weights   05/11/18 1902 05/11/18 1908  Weight: 81.6 kg 81.6 kg   Body mass index is 27.37 kg/m.  General:  Well nourished, well developed, tachypneic to the 30s on bipap.  HEENT: normal Lymph: no adenopathy Neck: JVD difficult to appreciate given tachypnea.  Endocrine:  No thryomegaly Vascular: No carotid bruits; FA pulses 2+ bilaterally without bruits. 2+ R radial pulse.  Cardiac:  normal S1, S2; RRR; systolic murmur at LLSB.  Lungs:  Bibasilar crackles to mid lung.  Abd: soft, nontender, no hepatomegaly  Ext: warm well perfused. No edema.  Musculoskeletal:  No deformities, BUE and BLE strength  normal and equal Skin: warm and dry. Blistering reticular rash on trunk and extremities.  Neuro:  CNs 2-12 intact, no focal abnormalities noted Psych:  Normal affect   EKG:  The EKG was personally reviewed and demonstrates:  ST elevations in V3-V6 that are new from  prior. Q waves (old) anteriorly.   Relevant CV Studies: TTE 08/2017: Study Conclusions  - Left ventricle: Septal apical and inferior wall hypokinesis   regionality suggests CAD. The cavity size was severely dilated.   Wall thickness was increased in a pattern of severe LVH. Systolic   function was moderately to severely reduced. The estimated   ejection fraction was in the range of 30% to 35%. Doppler   parameters are consistent with both elevated ventricular   end-diastolic filling pressure and elevated left atrial filling   pressure. - Atrial septum: No defect or patent foramen ovale was identified.  Laboratory Data:  Chemistry Recent Labs  Lab 05/11/18 1820 05/11/18 1833 05/11/18 2054  NA 138 138 140  K 4.8 4.8 4.7  CL 104  --   --   CO2 23  --   --   GLUCOSE 156*  --   --   BUN 43*  --   --   CREATININE 2.82*  --   --   CALCIUM 8.5*  --   --   GFRNONAA 23*  --   --   GFRAA 27*  --   --   ANIONGAP 11  --   --     Recent Labs  Lab 05/11/18 1820  PROT 7.0  ALBUMIN 2.4*  AST 24  ALT 34  ALKPHOS 107  BILITOT 0.7   Hematology Recent Labs  Lab 05/11/18 1820 05/11/18 1833 05/11/18 2054  WBC 16.2*  --   --   RBC 3.57*  --   --   HGB 10.2* 10.9* 10.2*  HCT 32.4* 32.0* 30.0*  MCV 90.8  --   --   MCH 28.6  --   --   MCHC 31.5  --   --   RDW 13.2  --   --   PLT 242  --   --    Cardiac EnzymesNo results for input(s): TROPONINI in the last 168 hours.  Recent Labs  Lab 05/11/18 1829  TROPIPOC 0.62*    BNPNo results for input(s): BNP, PROBNP in the last 168 hours.  DDimer  Recent Labs  Lab 05/11/18 1820  DDIMER 7.99*    Radiology/Studies:  Ct Chest Wo Contrast  Result Date: 05/11/2018 CLINICAL DATA:  58 year old male with hypoxia. EXAM: CT CHEST WITHOUT CONTRAST TECHNIQUE: Multidetector CT imaging of the chest was performed following the standard protocol without IV contrast. COMPARISON:  Chest radiograph dated 05/11/2018 FINDINGS: Evaluation of this  exam is limited in the absence of intravenous contrast. Cardiovascular: There is mild cardiomegaly. No significant pericardial effusion. There is multi vessel coronary vascular calcification. There is hypoattenuation of the cardiac blood pool suggestive of a degree of anemia. Clinical correlation is recommended. There is moderate atherosclerotic calcification of the thoracic aorta. The central pulmonary arteries are grossly unremarkable. Mediastinum/Nodes: Top-normal paratracheal lymph nodes measure up to 10 mm in short axis. Subcarinal lymph node measures 17 mm in short axis. The esophagus is grossly unremarkable. No mediastinal fluid collection. Lungs/Pleura: There are small bilateral pleural effusions. There is diffuse interstitial and interlobular septal prominence with Kerley B-lines as well as patchy areas of central and perihilar ground-glass density. Findings most consistent with pulmonary edema with probable  superimposed pneumonia. Clinical correlation is recommended. There is no pneumothorax. The central airways are patent. Upper Abdomen: There are stones within the gallbladder. There is slight irregularity of the liver contour which may represent early changes of cirrhosis. Clinical correlation is recommended. Musculoskeletal: Degenerative changes of the spine. No acute osseous pathology. IMPRESSION: 1. Findings most consistent with pulmonary edema with probable superimposed pneumonia. Clinical correlation is recommended. 2. Small bilateral pleural effusions. 3. Cholelithiasis. Electronically Signed   By: Anner Crete M.D.   On: 05/11/2018 19:59   Dg Chest Port 1 View  Result Date: 05/11/2018 CLINICAL DATA:  Shortness of breath, weakness, and vomiting. EXAM: PORTABLE CHEST 1 VIEW COMPARISON:  05/25/2017 FINDINGS: Mild cardiomegaly. Moderate diffuse interstitial infiltrates with Kerley B-lines, consistent with diffuse interstitial edema. No evidence of focal consolidation or significant pleural  effusion. IMPRESSION: Cardiomegaly and diffuse interstitial edema, consistent with congestive heart failure. Electronically Signed   By: Earle Gell M.D.   On: 05/11/2018 18:47    Assessment and Plan:   Mr. Adams is a 58 year old man with HTN, poorly controlled DM, HLD who presents with hypoxemic respiratory failure and elevated cardiac biomarkers concerning for ACS. Suspect his "heartburn" over the preceding weeks was an anginal equivalent. He clearly has multivessel CAD based upon CT scan and presumed ischemic myopathy with EF 30-35% and regional wall motion abnormalities. Thus, I am concerned that he has had an interval ACS event that has resulted in his clinical presentation. PE possible, but EKG and CT chest more suggestive of ACS.  I discussed the patient's history, EKG, and uptrending biomarkers with Dr. Martinique @ 21:45 (on call interventionalist) who deferred cardiac catheterization tonight.   #ACS --Plavix loaded -> plavix 75 daily + ASA 81mg  daily -- INCREASE atorvastatin to high dose (80mg  daily) -- Please recheck a1c and lipid panel to risk stratify -- Continue heparin gtt (would treat to therapeutic ptt goal given pending VQ scan to r/o PE).  -- HOLD beta blocker given pulmonary edema -- Serial troponins and EKGs overnight -- If recurrent "heartburn" please trial SLN if blood pressure will allow.   #Hypoxemic Respiratory Failure #Acute on Chronic Systolic Heart Failure -- Agree with diuresis 40 IV lasix x 1 -- Goal net negative 1-2 L today given respiratory status.  -- Consider foley placement given incontinence.  -- Please monitor I/Os strictly with daily weights.  -- Wean bipap as tolerated.  -- Agree with abx treatment for PNA; will cont vanc/cefepime and check RVPCR to r/o viral URI.  -- WWP on exam, normal lactate reassuring.   #DM -- Consider SGLT2i given HF and DM prior to discharge (will need to r/o significant PAD given history).  -- ISS PRN for hyperglycemia  (currently NPO)  #AKI on CKD -- Will send urinalysis -- Consider renal ultrasound to r/o obstruction -- DDx includes ATN in light of reported hypotension v. Congestion. Would pursue ongoing diuresis given respiratory status and now with pressure head to facilitate.   #HTN -- Given CHF and AKI, would favor hydralazine + nitrates for BP control for now if needed.  -- Hold Losartan - HCTZ given renal function -- Would d/c amlodipine in favor of guideline directed medical therapy for HF.   #Rash -- Per sister ?dx bullous pemphigoid -- Would consult derm in the AM.  #Cognitive Impairment #Learning Disability -- Sister is primary caregiver and Media planner. In most Pt. Does not have capacity to make complex medical decisions for himself.   #Code Status -- Patient  DNR but family would be amenable to cardiac catheterization should it be recommended. Patient currently resides in [place] due to immobility issues where he medication administration is supervised.   For questions or updates, please contact [place] Please consult [email or web address] for contact info under   Signed, Milus Banister, MD  05/11/2018 9:29 PM  Addendum: Repeat troponin 10.31. Dr Martinique paged with updated result @ 2:15 AM.

## 2018-05-11 NOTE — ED Notes (Signed)
Pt's family stepped out to state that Pt was experiencing "heart burn" again. RN to be notified.

## 2018-05-11 NOTE — Progress Notes (Signed)
Pt Placed on NIV for hypoxic respiratory failure. Once taken off NRB mask and transitioned to NIV mask patient oxygen saturations immediately dropped to the low 80's. Family is at bedside.

## 2018-05-11 NOTE — Progress Notes (Signed)
ANTICOAGULATION CONSULT NOTE - Initial Consult  Pharmacy Consult for Heparin Indication: chest pain/ACS  Allergies  Allergen Reactions  . Sulfamethoxazole     REACTION: rash    Patient Measurements: Height: 5\' 8"  (172.7 cm) Weight: 180 lb (81.6 kg) IBW/kg (Calculated) : 68.4  Vital Signs: Temp: 98.6 F (37 C) (01/22 1902) Temp Source: Rectal (01/22 1902) BP: 118/67 (01/22 1845) Pulse Rate: 78 (01/22 1845)  Labs: Recent Labs    05/11/18 1820 05/11/18 1833  HGB 10.2* 10.9*  HCT 32.4* 32.0*  PLT 242  --   CREATININE 2.82*  --     Estimated Creatinine Clearance: 27.3 mL/min (A) (by C-G formula based on SCr of 2.82 mg/dL (H)).   Medical History: Past Medical History:  Diagnosis Date  . CEREBROVASCULAR ACCIDENT, HX OF 09/28/2006   2007 affected left side and peripheral vision  . DIABETES MELLITUS, TYPE II 09/28/2006  . GERD (gastroesophageal reflux disease)   . HYPERLIPIDEMIA 09/28/2006  . HYPERTENSION 09/28/2006  . Learning disability   . Stroke [facility]) years ago    Assessment: 58 year old male with history of diabetes, hypertension, hyperlipidemia, prior stroke brought here via EMS from [place] for evaluation of altered mental status.    Goal of Therapy:  Heparin level 0.3-0.7 units/ml Monitor platelets by anticoagulation protocol: Yes   Plan:  Give 4000 units bolus x 1 Start heparin infusion at 1000 units/hr Check anti-Xa level in 8 hours and daily while on heparin Continue to monitor H&H and platelets  Corinda Gubler 05/11/2018,7:12 PM

## 2018-05-11 NOTE — ED Notes (Signed)
RT at bedside.

## 2018-05-11 NOTE — Consult Note (Signed)
NAME:  William [name], MRN:  [id number], DOB:  [birth year], LOS: 0 ADMISSION DATE:  05/11/2018, CONSULTATION DATE:  05/11/18 REFERRING MD:  Marletta Lor  CHIEF COMPLAINT:  Heartburn  Brief History   William [name] is a 58 y.o. male who was admitted 1/22 with NSTEMI and hypoxic respiratory failure requiring BiPAP.  Admitted by cardiology and PCCM asked to assist with management of NIMV.  History of present illness   William [name] is a 57 y.o. male who has a PMH as outlined below and who resides in a facility due to mostly bedbound state following CVA and significant learning disability with cognitive impairment (see "past medical history").  He presented to [facility] ED 1/22 after family found him lethargic.  Per EMS, he was hypotensive and hypoxic upon their arrival.  In ED, he was normotensive but did require NRB to maintain adequate oxygenation.  EKG demonstrated STE in V2-V4 which were new and initial troponin was 0.62 (repeat 2.92).  No reciprical changes noted and code STEMI not activated. D-dimer was elevated at 7.99.  CT chest demonstrated multivessel calcification along with bilateral pleural effusions and pulmonary edema.  CTA was deferred to due AKI.  V/Q scan pending.  Pt was admitted by cardiology for NSTEMI.  PCCM asked to see in consultation for assistance with NIMV management.  Of note, pt did arrive from facility with DNR paperwork in hand.  Past Medical History  CVA, HTN, HLD, sCHF (echo from May 2019 with EF 30 - 35%), DM, learning disability.  Significant Hospital Events   1/22 > admit.  Consults:  PCCM.  Procedures:  None.  Significant Diagnostic Tests:  CT chest 1/22 > pulmonary edema with probable superimposed PNA.  Small b/l pleural effusions. V/Q scan 1/22 >  Echo 1/23 >  LE duplex 1/23 >    Micro Data:  Blood 1/22 >  Sputum 1/22 >  RVP 1/22 >   Antimicrobials:  Vanc 1/22 >  Cefepime 1/22 >    Interim history/subjective:  On BiPAP, comfortable. Vitals  stable.  Objective:  Blood pressure 137/74, pulse 78, temperature 98.6 F (37 C), temperature source Rectal, resp. rate (!) 21, height 5\' 8"  (1.727 m), weight 81.6 kg, SpO2 99 %.        Intake/Output Summary (Last 24 hours) at 05/11/2018 2322 Last data filed at 05/11/2018 2252 Gross per 24 hour  Intake 1100 ml  Output -  Net 1100 ml   Filed Weights   05/11/18 1902 05/11/18 1908  Weight: 81.6 kg 81.6 kg    Examination: General: Middle aged male, in NAD. Neuro: Awake, follows some basic commands. HEENT: [place]/AT. Sclerae anicteric.  BiPAP in place. Cardiovascular: RRR, no M/R/G.  Lungs: Respirations even and unlabored.  Faint basilar crackles. Abdomen: BS x 4, soft, NT/ND.  Musculoskeletal: No gross deformities, no edema.  Skin: Intact, warm, no rashes.  Assessment & Plan:   Acute hypoxic respiratory failure - due to acute pulmonary edema from underling CHF, small bilateral pleural effusions, NSTEMI. - Continue BiPAP for now. - Hold further diuresis for now given labile BP earlier (received 40mg  x 1 in ED). - If BP holds, can consider gentle diuresis in AM for goal negative balance. - Follow CXR.  Possible HCAP. - Empiric vanc / cefepime. - Follow cultures / PCT. - Add RVP.  Elevated D-dimer - ? Significance given probable ACS. - F/u on V/Q scan (CTA restricted due to AKI). - Continue empiric heparin gtt. - Add LE duplex studies.  NSTEMI.  - Cardiology following / managing. - Continue heparin, plavix.   Rest per primary team.   Best Practice:  Diet: NPO. Pain/Anxiety/Delirium protocol (if indicated): N/A. VAP protocol (if indicated): N/A. DVT prophylaxis: heparin gtt. GI prophylaxis: N/A. Glucose control: N/A. Mobility: Bedrest. Code Status: DNR. Family Communication: None available. Disposition: ICU.  Labs   CBC: Recent Labs  Lab 05/11/18 1820 05/11/18 1833 05/11/18 2054  WBC 16.2*  --   --   HGB 10.2* 10.9* 10.2*  HCT 32.4* 32.0* 30.0*  MCV  90.8  --   --   PLT 242  --   --    Basic Metabolic Panel: Recent Labs  Lab 05/11/18 1820 05/11/18 1833 05/11/18 2054  NA 138 138 140  K 4.8 4.8 4.7  CL 104  --   --   CO2 23  --   --   GLUCOSE 156*  --   --   BUN 43*  --   --   CREATININE 2.82*  --   --   CALCIUM 8.5*  --   --    GFR: Estimated Creatinine Clearance: 27.3 mL/min (A) (by C-G formula based on SCr of 2.82 mg/dL (H)). Recent Labs  Lab 05/11/18 1820  WBC 16.2*  LATICACIDVEN 1.5   Liver Function Tests: Recent Labs  Lab 05/11/18 1820  AST 24  ALT 34  ALKPHOS 107  BILITOT 0.7  PROT 7.0  ALBUMIN 2.4*   No results for input(s): LIPASE, AMYLASE in the last 168 hours. No results for input(s): AMMONIA in the last 168 hours. ABG    Component Value Date/Time   PHART 7.359 05/11/2018 2054   PCO2ART 39.3 05/11/2018 2054   PO2ART 56.0 (L) 05/11/2018 2054   HCO3 22.2 05/11/2018 2054   TCO2 23 05/11/2018 2054   ACIDBASEDEF 3.0 (H) 05/11/2018 2054   O2SAT 88.0 05/11/2018 2054    Coagulation Profile: No results for input(s): INR, PROTIME in the last 168 hours. Cardiac Enzymes: No results for input(s): CKTOTAL, CKMB, CKMBINDEX, TROPONINI in the last 168 hours. HbA1C: Hgb A1c MFr Bld  Date/Time Value Ref Range Status  08/19/2017 03:19 AM 9.2 (H) 4.8 - 5.6 % Final    Comment:    (NOTE) Pre diabetes:          5.7%-6.4% Diabetes:              >6.4% Glycemic control for   <7.0% adults with diabetes   05/25/2017 04:12 PM 7.9 (H) 4.6 - 6.5 % Final    Comment:    Glycemic Control Guidelines for People with Diabetes:Non Diabetic:  <6%Goal of Therapy: <7%Additional Action Suggested:  >8%    CBG: No results for input(s): GLUCAP in the last 168 hours.  Review of Systems:   Unable to obtain as pt is on BiPAP and not able to answer all questions appropriately.  Past medical history  He,  has a past medical history of CEREBROVASCULAR ACCIDENT, HX OF (09/28/2006), DIABETES MELLITUS, TYPE II (09/28/2006), GERD  (gastroesophageal reflux disease), HYPERLIPIDEMIA (09/28/2006), HYPERTENSION (09/28/2006), Learning disability, and Stroke ([place]) (years ago).   Surgical History    Past Surgical History:  Procedure Laterality Date  . COLONOSCOPY    . LYMPHADENECTOMY    . RHINOPLASTY    . SIGMOIDOSCOPY       Social History   reports that he has been smoking cigarettes. He has been smoking about 1.00 pack per day. He has quit using smokeless tobacco. He reports that he does not drink  alcohol or use drugs.   Family history   His family history includes Alcohol abuse in his brother; Diabetes in his brother and father; Heart attack in his sister; Lung cancer in his mother; Stroke in his father. There is no history of Colon cancer.   Allergies Allergies  Allergen Reactions  . Sulfamethoxazole     REACTION: rash     Home meds  Prior to Admission medications   Medication Sig Start Date End Date Taking? Authorizing Provider  acetaminophen (TYLENOL) 325 MG tablet Take 650 mg by mouth every 6 (six) hours as needed for fever.   Yes [provider]  amLODipine (NORVASC) 10 MG tablet Take 10 mg by mouth daily.  08/25/17  Yes [provider]  aspirin 325 MG tablet Take 1 tablet (325 mg total) by mouth daily. 08/24/17  Yes Domenic Polite, MD  atorvastatin (LIPITOR) 20 MG tablet TAKE 1 TABLET BY MOUTH  DAILY 04/22/17  Yes Marin Olp, MD  buPROPion (WELLBUTRIN XL) 150 MG 24 hr tablet Take 1 tablet (150 mg total) by mouth daily. 04/23/17  Yes Marin Olp, MD  doxycycline (VIBRAMYCIN) 100 MG capsule Take 1 capsule (100 mg total) by mouth 2 (two) times daily. One po bid x 7 days 05/07/18  Yes Street, [place], PA-C  escitalopram (LEXAPRO) 10 MG tablet Take 10 mg by mouth daily.   Yes [provider]  ferrous sulfate 325 (65 FE) MG tablet Take 1 tablet (325 mg total) by mouth 2 (two) times daily with a meal. Patient taking differently: Take 325 mg by mouth daily with breakfast.  03/24/17   Yes Marin Olp, MD  hydrOXYzine (ATARAX/VISTARIL) 25 MG tablet Take 1 tablet (25 mg total) by mouth every 6 (six) hours as needed for itching. 05/07/18  Yes Street, [address], PA-C  Hypromellose (ARTIFICIAL TEARS) 0.4 % SOLN Apply 2 drops to eye 2 (two) times daily.   Yes [provider]  insulin aspart (NOVOLOG PENFILL) cartridge Inject 20-24 Units into the skin 3 (three) times daily before meals. 20 units in the morning. 24 units in the afternoon. 20 units in the evening 11/24/17  Yes [provider]  Insulin Glargine (LANTUS SOLOSTAR) 100 UNIT/ML Solostar Pen Inject 62 Units into the skin at bedtime.  11/17/17  Yes [provider]  labetalol (NORMODYNE) 200 MG tablet Take 200 mg by mouth 2 (two) times daily. 08/31/17  Yes [provider]  linagliptin (TRADJENTA) 5 MG TABS tablet Take 5 mg by mouth daily. 09/27/17  Yes [provider]  losartan-hydrochlorothiazide (HYZAAR) 100-25 MG tablet Take 1 tablet by mouth daily.   Yes [provider]  omeprazole (PRILOSEC) 20 MG capsule TAKE 1 CAPSULE BY MOUTH  DAILY 07/29/17  Yes Marin Olp, MD  simethicone (MYLICON) 80 MG chewable tablet Chew 160 mg by mouth 3 (three) times daily as needed for flatulence.   Yes [provider]  escitalopram (LEXAPRO) 5 MG tablet Take 1 tablet (5 mg total) by mouth daily. Patient not taking: Reported on 05/11/2018 07/05/17   Briscoe Deutscher, DO    Critical care time: 30 min.    Montey Hora, [facility] Pager: [phone].  If no answer, [phone] 05/11/2018, 11:22 PM

## 2018-05-11 NOTE — Progress Notes (Addendum)
Pharmacy Antibiotic Note  William [name] is a 58 y.o. male admitted on 05/11/2018 with pneumonia.  Pharmacy has been consulted for vanc dosing. He was recently seen in the ED for rash. At this admission, he was brought here for AMS. He was found to be hypotensive and low sats. Abx was ordered to r/o PNA.   Pt presented from [facility]. Scr 2.82, CrCl 27, WBC 16.2  Plan: Vanc 1.5g IV x1 then 750mg  IV q24>>AUC 402, VP 24, VT 11.6, scr 2.82 F/u with cefepime order  Addendum (2300): MD order for cefepime per pharmacy for HCAP.  Plan: Cefepime 1gm IV q24h Will f/u renal function, micro data, and pt's clinical condition  Height: 5\' 8"  (172.7 cm) Weight: 180 lb (81.6 kg) IBW/kg (Calculated) : 68.4  Temp (24hrs), Avg:98.3 F (36.8 C), Min:98 F (36.7 C), Max:98.6 F (37 C)  Recent Labs  Lab 05/11/18 1820  WBC 16.2*  CREATININE 2.82*  LATICACIDVEN 1.5    Estimated Creatinine Clearance: 27.3 mL/min (A) (by C-G formula based on SCr of 2.82 mg/dL (H)).    Allergies  Allergen Reactions  . Sulfamethoxazole     REACTION: rash    Antimicrobials this admission: 1/22 vanc>> 1/22 cefepime>>  Dose adjustments this admission:   Microbiology results: 1/22 blood>> MRSA PCR>>  Onnie Boer, PharmD, BCIDP, AAHIVP, CPP Infectious Disease Pharmacist 05/11/2018 8:49 PM

## 2018-05-11 NOTE — ED Notes (Signed)
RN called RT to place pt on Bipap, Pt's O2 sats 88-90% on NRB. MD Lorenso Courier made aware. Pt does not report feeling SHOB, pt tachypneic

## 2018-05-11 NOTE — ED Triage Notes (Signed)
Pt to ED via EMS from [facility]. They called EMS for vomiting x 2 today and hypotension (80/60). Lethargic but A/O x4 with EMS. EMS bp 110/70. Pt normally on room air, Initial O2 sat on 2L 61%.

## 2018-05-12 ENCOUNTER — Inpatient Hospital Stay (HOSPITAL_COMMUNITY): Payer: Medicare Other

## 2018-05-12 DIAGNOSIS — R0602 Shortness of breath: Secondary | ICD-10-CM

## 2018-05-12 DIAGNOSIS — I2102 ST elevation (STEMI) myocardial infarction involving left anterior descending coronary artery: Secondary | ICD-10-CM

## 2018-05-12 DIAGNOSIS — I213 ST elevation (STEMI) myocardial infarction of unspecified site: Secondary | ICD-10-CM | POA: Insufficient documentation

## 2018-05-12 DIAGNOSIS — I214 Non-ST elevation (NSTEMI) myocardial infarction: Secondary | ICD-10-CM

## 2018-05-12 DIAGNOSIS — I5021 Acute systolic (congestive) heart failure: Secondary | ICD-10-CM

## 2018-05-12 DIAGNOSIS — I5023 Acute on chronic systolic (congestive) heart failure: Secondary | ICD-10-CM

## 2018-05-12 DIAGNOSIS — R079 Chest pain, unspecified: Secondary | ICD-10-CM

## 2018-05-12 DIAGNOSIS — J810 Acute pulmonary edema: Secondary | ICD-10-CM

## 2018-05-12 DIAGNOSIS — J9601 Acute respiratory failure with hypoxia: Secondary | ICD-10-CM

## 2018-05-12 LAB — BASIC METABOLIC PANEL
Anion gap: 9 (ref 5–15)
Anion gap: 11 (ref 5–15)
BUN: 47 mg/dL — ABNORMAL HIGH (ref 6–20)
BUN: 51 mg/dL — ABNORMAL HIGH (ref 6–20)
CHLORIDE: 106 mmol/L (ref 98–111)
CO2: 21 mmol/L — ABNORMAL LOW (ref 22–32)
CO2: 22 mmol/L (ref 22–32)
Calcium: 7.9 mg/dL — ABNORMAL LOW (ref 8.9–10.3)
Calcium: 8.2 mg/dL — ABNORMAL LOW (ref 8.9–10.3)
Chloride: 109 mmol/L (ref 98–111)
Creatinine, Ser: 2.83 mg/dL — ABNORMAL HIGH (ref 0.61–1.24)
Creatinine, Ser: 3.22 mg/dL — ABNORMAL HIGH (ref 0.61–1.24)
GFR calc Af Amer: 27 mL/min — ABNORMAL LOW (ref >60)
GFR calc Af Amer: 23 mL/min — ABNORMAL LOW (ref >60)
GFR calc non Af Amer: 23 mL/min — ABNORMAL LOW (ref >60)
GFR calc non Af Amer: 20 mL/min — ABNORMAL LOW (ref >60)
Glucose, Bld: 103 mg/dL — ABNORMAL HIGH (ref 70–99)
Glucose, Bld: 65 mg/dL — ABNORMAL LOW (ref 70–99)
Potassium: 4.3 mmol/L (ref 3.5–5.1)
Potassium: 4.1 mmol/L (ref 3.5–5.1)
SODIUM: 139 mmol/L (ref 135–145)
SODIUM: 139 mmol/L (ref 135–145)

## 2018-05-12 LAB — RESPIRATORY PANEL BY PCR
ADENOVIRUS-RVPPCR: NOT DETECTED
Bordetella pertussis: NOT DETECTED
CHLAMYDOPHILA PNEUMONIAE-RVPPCR: NOT DETECTED
Coronavirus 229E: NOT DETECTED
Coronavirus HKU1: NOT DETECTED
Coronavirus NL63: NOT DETECTED
Coronavirus OC43: NOT DETECTED
Influenza A: NOT DETECTED
Influenza B: NOT DETECTED
Metapneumovirus: NOT DETECTED
Mycoplasma pneumoniae: NOT DETECTED
Parainfluenza Virus 1: NOT DETECTED
Parainfluenza Virus 2: NOT DETECTED
Parainfluenza Virus 3: NOT DETECTED
Parainfluenza Virus 4: NOT DETECTED
Respiratory Syncytial Virus: NOT DETECTED
Rhinovirus / Enterovirus: NOT DETECTED

## 2018-05-12 LAB — TROPONIN I
Troponin I: 10.31 ng/mL (ref <0.03)
Troponin I: 17.36 ng/mL (ref <0.03)
Troponin I: 18.26 ng/mL (ref <0.03)

## 2018-05-12 LAB — CBC
HCT: 29.3 % — ABNORMAL LOW (ref 39.0–52.0)
Hemoglobin: 9.2 g/dL — ABNORMAL LOW (ref 13.0–17.0)
MCH: 28.1 pg (ref 26.0–34.0)
MCHC: 31.4 g/dL (ref 30.0–36.0)
MCV: 89.6 fL (ref 80.0–100.0)
PLATELETS: 217 10*3/uL (ref 150–400)
RBC: 3.27 MIL/uL — ABNORMAL LOW (ref 4.22–5.81)
RDW: 13.3 % (ref 11.5–15.5)
WBC: 11.2 10*3/uL — ABNORMAL HIGH (ref 4.0–10.5)
nRBC: 0 % (ref 0.0–0.2)

## 2018-05-12 LAB — URINALYSIS, ROUTINE W REFLEX MICROSCOPIC
Bilirubin Urine: NEGATIVE
Glucose, UA: NEGATIVE mg/dL
Ketones, ur: NEGATIVE mg/dL
Leukocytes, UA: NEGATIVE
Nitrite: NEGATIVE
Protein, ur: 100 mg/dL — AB
Specific Gravity, Urine: 1.014 (ref 1.005–1.030)
pH: 5 (ref 5.0–8.0)

## 2018-05-12 LAB — PROTIME-INR
INR: 1.28
Prothrombin Time: 15.9 seconds — ABNORMAL HIGH (ref 11.4–15.2)

## 2018-05-12 LAB — HEMOGLOBIN A1C
HEMOGLOBIN A1C: 8.3 % — AB (ref 4.8–5.6)
Mean Plasma Glucose: 191.51 mg/dL

## 2018-05-12 LAB — SODIUM, URINE, RANDOM: Sodium, Ur: 35 mmol/L

## 2018-05-12 LAB — LIPID PANEL
Cholesterol: 90 mg/dL (ref 0–200)
HDL: 22 mg/dL — ABNORMAL LOW (ref >40)
LDL CALC: 49 mg/dL (ref 0–99)
Total CHOL/HDL Ratio: 4.1 RATIO
Triglycerides: 95 mg/dL (ref <150)
VLDL: 19 mg/dL (ref 0–40)

## 2018-05-12 LAB — ECHOCARDIOGRAM COMPLETE
Height: 68 in
WEIGHTICAEL: 2998.26 [oz_av]

## 2018-05-12 LAB — MRSA PCR SCREENING: MRSA by PCR: NEGATIVE

## 2018-05-12 LAB — APTT: APTT: 50 s — AB (ref 24–36)

## 2018-05-12 LAB — MAGNESIUM: Magnesium: 2 mg/dL (ref 1.7–2.4)

## 2018-05-12 LAB — PHOSPHORUS: Phosphorus: 4.2 mg/dL (ref 2.5–4.6)

## 2018-05-12 LAB — HEPARIN LEVEL (UNFRACTIONATED): Heparin Unfractionated: 0.18 IU/mL — ABNORMAL LOW (ref 0.30–0.70)

## 2018-05-12 LAB — GLUCOSE, CAPILLARY
Glucose-Capillary: 101 mg/dL — ABNORMAL HIGH (ref 70–99)
Glucose-Capillary: 121 mg/dL — ABNORMAL HIGH (ref 70–99)
Glucose-Capillary: 77 mg/dL (ref 70–99)

## 2018-05-12 MED ORDER — INSULIN ASPART 100 UNIT/ML ~~LOC~~ SOLN
0.0000 [IU] | Freq: Every day | SUBCUTANEOUS | Status: DC
  Administered 2018-05-13: 3 [IU] via SUBCUTANEOUS

## 2018-05-12 MED ORDER — ORAL CARE MOUTH RINSE
15.0000 mL | Freq: Two times a day (BID) | OROMUCOSAL | Status: DC
  Administered 2018-05-12: 15 mL via OROMUCOSAL

## 2018-05-12 MED ORDER — CHLORHEXIDINE GLUCONATE 0.12 % MT SOLN
15.0000 mL | Freq: Two times a day (BID) | OROMUCOSAL | Status: DC
  Administered 2018-05-12 – 2018-05-13 (×3): 15 mL via OROMUCOSAL
  Filled 2018-05-12: qty 15

## 2018-05-12 MED ORDER — ISOSORBIDE MONONITRATE ER 30 MG PO TB24
30.0000 mg | ORAL_TABLET | Freq: Every day | ORAL | Status: DC
  Administered 2018-05-12: 30 mg via ORAL
  Filled 2018-05-12: qty 1

## 2018-05-12 MED ORDER — FUROSEMIDE 10 MG/ML IJ SOLN
80.0000 mg | Freq: Once | INTRAMUSCULAR | Status: AC
  Administered 2018-05-12: 80 mg via INTRAVENOUS
  Filled 2018-05-12: qty 8

## 2018-05-12 MED ORDER — METOLAZONE 5 MG PO TABS
10.0000 mg | ORAL_TABLET | Freq: Every day | ORAL | Status: AC
  Administered 2018-05-12: 10 mg via ORAL
  Filled 2018-05-12: qty 2

## 2018-05-12 MED ORDER — HEPARIN BOLUS VIA INFUSION
2500.0000 [IU] | Freq: Once | INTRAVENOUS | Status: AC
  Administered 2018-05-12: 2500 [IU] via INTRAVENOUS
  Filled 2018-05-12: qty 2500

## 2018-05-12 MED ORDER — INSULIN ASPART 100 UNIT/ML ~~LOC~~ SOLN
0.0000 [IU] | Freq: Three times a day (TID) | SUBCUTANEOUS | Status: DC
  Administered 2018-05-13 – 2018-05-14 (×2): 1 [IU] via SUBCUTANEOUS
  Administered 2018-05-14: 3 [IU] via SUBCUTANEOUS
  Administered 2018-05-15: 2 [IU] via SUBCUTANEOUS
  Administered 2018-05-15: 1 [IU] via SUBCUTANEOUS
  Administered 2018-05-15 – 2018-05-16 (×4): 2 [IU] via SUBCUTANEOUS
  Administered 2018-05-17: 3 [IU] via SUBCUTANEOUS
  Administered 2018-05-17: 2 [IU] via SUBCUTANEOUS
  Administered 2018-05-17: 1 [IU] via SUBCUTANEOUS
  Administered 2018-05-18 (×2): 2 [IU] via SUBCUTANEOUS

## 2018-05-12 MED ORDER — FUROSEMIDE 10 MG/ML IJ SOLN
120.0000 mg | Freq: Two times a day (BID) | INTRAVENOUS | Status: DC
  Administered 2018-05-12 – 2018-05-13 (×4): 120 mg via INTRAVENOUS
  Filled 2018-05-12 (×2): qty 10
  Filled 2018-05-12: qty 2
  Filled 2018-05-12: qty 12
  Filled 2018-05-12: qty 10

## 2018-05-12 NOTE — Progress Notes (Signed)
ANTICOAGULATION CONSULT NOTE - Follow-up Consult  Pharmacy Consult for Heparin Indication: chest pain/ACS  Allergies  Allergen Reactions  . Sulfamethoxazole     REACTION: rash    Patient Measurements: Height: 5\' 8"  (172.7 cm) Weight: 187 lb 6.3 oz (85 kg) IBW/kg (Calculated) : 68.4  Vital Signs: Temp: 97.7 F (36.5 C) (01/23 1226) Temp Source: Oral (01/23 1226) BP: 125/64 (01/23 1200) Pulse Rate: 69 (01/23 1200)  Labs: Recent Labs    05/11/18 1820 05/11/18 1833 05/11/18 2054 05/12/18 0010 05/12/18 0323 05/12/18 0617 05/12/18 1432  HGB 10.2* 10.9* 10.2*  --  9.2*  --   --   HCT 32.4* 32.0* 30.0*  --  29.3*  --   --   PLT 242  --   --   --  217  --   --   APTT  --   --   --   --  50*  --   --   LABPROT  --   --   --   --  15.9*  --   --   INR  --   --   --   --  1.28  --   --   HEPARINUNFRC  --   --   --   --  <0.10*  --  0.18*  CREATININE 2.82*  --   --   --  2.83*  --  3.22*  TROPONINI  --   --   --  10.31* 17.36* 18.26*  --     Estimated Creatinine Clearance: 26.2 mL/min (A) (by C-G formula based on SCr of 3.22 mg/dL (H)).   Medical History: Past Medical History:  Diagnosis Date  . CEREBROVASCULAR ACCIDENT, HX OF 09/28/2006   2007 affected left side and peripheral vision  . DIABETES MELLITUS, TYPE II 09/28/2006  . GERD (gastroesophageal reflux disease)   . HYPERLIPIDEMIA 09/28/2006  . HYPERTENSION 09/28/2006  . Learning disability   . Stroke [facility]) years ago    Assessment: 58 year old male with history of diabetes, hypertension, hyperlipidemia, prior stroke brought here via EMS from [place] for evaluation of altered mental status.  Pt on heparin gtt for ACS (Trop up to 18.26) and r/o PE. [name]  Heparin level subtherapeutic at 0.18 on 1300 units/hr. Hgb down to 9.2. Confirmed there are no issues with line or bleeding reported per RN.  Goal of Therapy:  Heparin level 0.3-0.7 units/ml Monitor platelets by anticoagulation protocol: Yes   Plan:  Rebolus  heparin 2500 units Increase heparin infusion to 1500 units/hr Check heparin level in 8 hours  Daily heparin level/CBC Monitor for signs and symptoms of bleeding  Thank you for allowing pharmacy to be a part of this patient's care.  Leron Croak, PharmD PGY1 Pharmacy Resident Phone: [phone]  Please check AMION for all [place] phone numbers 05/12/2018,3:17 PM

## 2018-05-12 NOTE — Progress Notes (Addendum)
Progress Note  Patient Name: William [name] Date of Encounter: 05/12/2018  Primary Cardiologist: No primary care provider on file. new  Subjective   Patient reports feeling well today. Breathing OK. Denies chest pain or "heart burn". Last had heart burn yesterday.  Inpatient Medications    Scheduled Meds: . aspirin  81 mg Oral Daily  . atorvastatin  80 mg Oral q1800  . chlorhexidine  15 mL Mouth Rinse BID  . clopidogrel  75 mg Oral Daily  . mouth rinse  15 mL Mouth Rinse q12n4p  . pantoprazole  40 mg Oral Daily   Continuous Infusions: . ceFEPime (MAXIPIME) IV    . heparin 1,300 Units/hr (05/12/18 0458)  . vancomycin     PRN Meds: ondansetron (ZOFRAN) IV   Vital Signs    Vitals:   05/12/18 0600 05/12/18 0630 05/12/18 0700 05/12/18 0740  BP: 134/71 137/72 126/61   Pulse: 73 70 69   Resp: (!) 24 15 16    Temp:    98.1 F (36.7 C)  TempSrc:    Oral  SpO2: 98% 100% 100%   Weight:      Height:        Intake/Output Summary (Last 24 hours) at 05/12/2018 0801 Last data filed at 05/12/2018 0700 Gross per 24 hour  Intake 1178.21 ml  Output 625 ml  Net 553.21 ml   Last 3 Weights 05/12/2018 05/11/2018 05/11/2018  Weight (lbs) 187 lb 6.3 oz 180 lb 180 lb  Weight (kg) 85 kg 81.647 kg 81.647 kg      Telemetry    NSR - Personally Reviewed  ECG    NSR with old anterior infarct with Q waves V1-4 and poor R wave progression. There is 1 mm ST elevation in V2-3 compared to old tracings. - Personally Reviewed  Physical Exam   GEN: WDWM No acute distress.   Neck:  JVD to 8 cm. No bruits. Cardiac: RRR, no murmurs, rubs, or gallops.  Respiratory: bilateral rales.  GI: Soft, nontender, non-distended  MS: No edema; No deformity. Neuro:  Nonfocal  Psych: Normal affect  Skin: multiple bullous and excoriated lesions on arms and legs.   Labs    Chemistry Recent Labs  Lab 05/11/18 1820 05/11/18 1833 05/11/18 2054 05/12/18 0323  NA 138 138 140 139  K 4.8 4.8 4.7  4.3  CL 104  --   --  109  CO2 23  --   --  21*  GLUCOSE 156*  --   --  103*  BUN 43*  --   --  47*  CREATININE 2.82*  --   --  2.83*  CALCIUM 8.5*  --   --  7.9*  PROT 7.0  --   --   --   ALBUMIN 2.4*  --   --   --   AST 24  --   --   --   ALT 34  --   --   --   ALKPHOS 107  --   --   --   BILITOT 0.7  --   --   --   GFRNONAA 23*  --   --  23*  GFRAA 27*  --   --  27*  ANIONGAP 11  --   --  9     Hematology Recent Labs  Lab 05/11/18 1820 05/11/18 1833 05/11/18 2054 05/12/18 0323  WBC 16.2*  --   --  11.2*  RBC 3.57*  --   --  3.27*  HGB 10.2* 10.9* 10.2* 9.2*  HCT 32.4* 32.0* 30.0* 29.3*  MCV 90.8  --   --  89.6  MCH 28.6  --   --  28.1  MCHC 31.5  --   --  31.4  RDW 13.2  --   --  13.3  PLT 242  --   --  217    Cardiac Enzymes Recent Labs  Lab 05/12/18 0010 05/12/18 0323 05/12/18 0617  TROPONINI 10.31* 17.36* 18.26*    Recent Labs  Lab 05/11/18 1829 05/11/18 2137  TROPIPOC 0.62* 2.92*     BNPNo results for input(s): BNP, PROBNP in the last 168 hours.   DDimer  Recent Labs  Lab 05/11/18 1820  DDIMER 7.99*     Radiology    Ct Chest Wo Contrast  Result Date: 05/11/2018 CLINICAL DATA:  58 year old male with hypoxia. EXAM: CT CHEST WITHOUT CONTRAST TECHNIQUE: Multidetector CT imaging of the chest was performed following the standard protocol without IV contrast. COMPARISON:  Chest radiograph dated 05/11/2018 FINDINGS: Evaluation of this exam is limited in the absence of intravenous contrast. Cardiovascular: There is mild cardiomegaly. No significant pericardial effusion. There is multi vessel coronary vascular calcification. There is hypoattenuation of the cardiac blood pool suggestive of a degree of anemia. Clinical correlation is recommended. There is moderate atherosclerotic calcification of the thoracic aorta. The central pulmonary arteries are grossly unremarkable. Mediastinum/Nodes: Top-normal paratracheal lymph nodes measure up to 10 mm in short  axis. Subcarinal lymph node measures 17 mm in short axis. The esophagus is grossly unremarkable. No mediastinal fluid collection. Lungs/Pleura: There are small bilateral pleural effusions. There is diffuse interstitial and interlobular septal prominence with Kerley B-lines as well as patchy areas of central and perihilar ground-glass density. Findings most consistent with pulmonary edema with probable superimposed pneumonia. Clinical correlation is recommended. There is no pneumothorax. The central airways are patent. Upper Abdomen: There are stones within the gallbladder. There is slight irregularity of the liver contour which may represent early changes of cirrhosis. Clinical correlation is recommended. Musculoskeletal: Degenerative changes of the spine. No acute osseous pathology. IMPRESSION: 1. Findings most consistent with pulmonary edema with probable superimposed pneumonia. Clinical correlation is recommended. 2. Small bilateral pleural effusions. 3. Cholelithiasis. Electronically Signed   By: Anner Crete M.D.   On: 05/11/2018 19:59   Dg Chest Port 1 View  Result Date: 05/11/2018 CLINICAL DATA:  Shortness of breath, weakness, and vomiting. EXAM: PORTABLE CHEST 1 VIEW COMPARISON:  05/25/2017 FINDINGS: Mild cardiomegaly. Moderate diffuse interstitial infiltrates with Kerley B-lines, consistent with diffuse interstitial edema. No evidence of focal consolidation or significant pleural effusion. IMPRESSION: Cardiomegaly and diffuse interstitial edema, consistent with congestive heart failure. Electronically Signed   By: Earle Gell M.D.   On: 05/11/2018 18:47    Cardiac Studies   Echo: 08/20/17: Study Conclusions  - Left ventricle: Septal apical and inferior wall hypokinesis   regionality suggests CAD. The cavity size was severely dilated.   Wall thickness was increased in a pattern of severe LVH. Systolic   function was moderately to severely reduced. The estimated   ejection fraction was in  the range of 30% to 35%. Doppler   parameters are consistent with both elevated ventricular   end-diastolic filling pressure and elevated left atrial filling   pressure. - Atrial septum: No defect or patent foramen ovale was identified.  Patient Profile     58 y.o. male former smoker with presumed ICM (EF 30-35%), HTN, poorly controlled DM (last a1c 9.2%),  HTN, CVA resulting in LE weakness, cognitive impairment (prior to stroke independent in ADLs) who presents with hypotension and hypoxia. Also noted frequent "heart burn"   Assessment & Plan    1. Acute on chronic systolic CHF. Known history of CM with EF 30-35%. On losartan HCT and labetalol at facility. Acute CHF related to STEMI and AKI. Needs more aggressive diuresis. Promote negative fluid balance. Check Echo. Sodium restriction. Hold ACEi/ARB/HCT now due to AKI. Hold beta blocker until pulmonary edema improved. Start nitrates and hydralazine.  2. STEMI. Patient does have some ST elevation in V3-4 new since prior tracings which also demonstrate evidence of old anterior infarct with Q waves and poor R wave progression V1-5. Troponin increased to 18. Not a candidate for emergent cardiac cath due to AKI and multiple comorbidities. Will treat with DAPT and IV heparin. Cycle enzymes and Ecg. Check Echo. Nitrates for now. He is having no chest pain currently 3. AKI. Baseline creatinine 1.7 in May 2019. Now 2.85. response to lasix poor. Will increase lasix dose. Check renal US.  4. Acute hypoxic respiratory failure secondary to pulmonary edema. Oxygen support.  5. IDDM. On SSI 6. HTN 7. Bullous rash. Monitor for now.  8. DNR. Patient has standing DNR. Will review with sister who is primary caregiver and decisions maker 9. History of CVA 10. Learning disability.    The patient is critically ill with multiple organ systems failure and requires high complexity decision making for assessment and support, frequent evaluation and titration of  therapies, application of advanced monitoring technologies and extensive interpretation of multiple databases.   For questions or updates, please contact [place] Please consult [email or web address] for contact info under        Signed, Girl Schissler [place], MD  05/12/2018, 8:01 AM

## 2018-05-12 NOTE — Progress Notes (Signed)
[facility] Progress Note Patient Name: William [name] DOB: [birth year] MRN: [id number]   Date of Service  05/12/2018  HPI/Events of Note  Pt admitted by cardiology to [facility] 08 for cardiomyopathy with CHF and pulmonary edema. Cardiology requested PCCM consultation for assistance with managing acute respiratory failure requiring BIPAP.  eICU Interventions  PCCM consult requested from Floor team.        Kerry Kass Rondle Lohse 05/12/2018, 1:04 AM

## 2018-05-12 NOTE — Social Work (Signed)
Pt presents to [facility] from [facility].   CSW continuing to follow for support with disposition when medically appropriate.  Westley Hummer, MSW, [place] Work [phone]

## 2018-05-12 NOTE — Progress Notes (Addendum)
NAME:  William [name], MRN:  [id number], DOB:  [birth year], LOS: 1 ADMISSION DATE:  05/11/2018, CONSULTATION DATE:  05/11/2018 REFERRING MD:  Dr. Marletta Lor, CHIEF COMPLAINT:  Heartburn    Brief History   William [name] is a 58 y.o. male who was admitted 1/22 with Acute Coronary Syndrome and hypoxic respiratory failure requiring BiPAP.  Admitted by cardiology and PCCM asked to assist with management of NIMV.  Past Medical History  CVA HTN HLD sCHF (echo from May 2019 with EF 30 - 35%) DM learning disability.  Significant Hospital Events   1/22 > admit.  Consults:  PCCM.  Procedures:  None.  Significant Diagnostic Tests:  CT chest 1/22 > pulmonary edema with probable superimposed PNA.  Small b/l pleural effusions. V/Q scan 1/22 >  Echo 1/23 >  LE duplex 1/23 >    Micro Data:  Blood 1/22 >  Sputum 1/22 >  RVP 1/22 >   Antimicrobials:  Vanc 1/22 >  Cefepime 1/22 >    Interim history/subjective:  Overnight: No acute events  Today, William [name] reports that he is doing well. He denies chest pain, shortness of breath, nausea, vomiting. Overall, he states that he feels better since admission.   Objective   Blood pressure 133/72, pulse 69, temperature 98.6 F (37 C), temperature source Axillary, resp. rate 19, height 5\' 8"  (1.727 m), weight 81.6 kg, SpO2 100 %.        Intake/Output Summary (Last 24 hours) at 05/12/2018 0612 Last data filed at 05/12/2018 0400 Gross per 24 hour  Intake 1142.11 ml  Output -  Net 1142.11 ml   Filed Weights   05/11/18 1902 05/11/18 1908  Weight: 81.6 kg 81.6 kg    Examination: General: In NAD HENT: BiPAP in place, NCAT Lungs: Fine bibasilar crackles Cardiovascular: RRR, no MGR Abdomen: BS+, NTTP Extremities: No edema Neuro: Awake, alert, conversational, speaks in full sentences.   Resolved Hospital Problem list     Assessment & Plan:  William [name] is a 58 year old gentleman with presumed ischemic cardiomyopathy, systolic  heart failure with EF 30 to 35%, hypertension, poorly controlled diabetes mellitus, hyperlipidemia, CVA with residual left lower extremity weakness who initially presented with hypotension and hypoxia requiring BiPAP.  On arrival, he was found to have elevated ST segments on EKG, cardiac enzymes and d-dimer.  CT chest without contrast was suggestive of pulmonary edema.  #Acute hypoxic respiratory failure  #Acute pulmonary edema from underling CHF, small bilateral pleural effusions -Continue BiPAP (12/6 FiO2 50%), wean as tolerated. He is currently DNR and will thus not intubate.  -Follow-up chest x-ray -Status post IV Lasix 40 mg x 3 dose. Continue Lasix infusion -Metolazone 10mg  po x1 dose -Monitor intake and output -Follow-up chest x-ray  #Acute Coronary Syndrome -EKG with ST elevations in V2-V4 -Serial troponin (17<10<2.9) -Status post Plavix load followed by Plavix 75 mg daily plus aspirin 81 mg daily -Continue heparin gtt -Increase Lipitor to 80 mg daily -EKGs -Hold beta-blocker for now -Sublingual nitroglycerin if recurrent heartburns and BP allows.  #Suspected H-CAP -Initial CT chest revealed pulmonary edema with probable superimposed pneumonia as well as small bilateral pleural effusions -Remained afebrile overnight, obesity with mild leukocytosis -Continue empiric antibiotics with vancomycin and cefepime -Follow-up RVP, sputum culture, blood culture  #Elevated D-dimer - etiology unclear. DDx include ACS vs PE vs Coagulopathy. -F/u on V/Q scan (CTA restricted due to AKI). -Continue empiric heparin gtt. -Add LE duplex studies.  #Acute on chronic kidney disease -Possible  etiologies include prerenal due to hypoperfusion from ongoing cardiac insult, follow-up renal ultrasound to rule out obstruction, follow-up urinalysis. -sCr 2.8 (baseline 1.5) - Follow-up urine sodium and creatinine  #Hypertension -BP stable  #Diabetes Mellitus -SSI -Will benefit for GLP1 agonist or  SGLT2 inhibitor however will require evaluation of PAD with ABI   #Cognitive impairment #Learning disability #Recent stroke - Sister is the primary caregiver and prior to admission he was independent of ADLs.  #Bilateral Upper extremity Rash -Continue to monitor   Best practice:  Diet: NPO. Pain/Anxiety/Delirium protocol (if indicated): N/A. VAP protocol (if indicated): N/A. DVT prophylaxis: heparin gtt. GI prophylaxis: N/A. Glucose control: N/A. Mobility: Bedrest. Code Status: DNR. Family Communication: None available. Disposition: ICU.  Labs   CBC: Recent Labs  Lab 05/11/18 1820 05/11/18 1833 05/11/18 2054 05/12/18 0323  WBC 16.2*  --   --  11.2*  HGB 10.2* 10.9* 10.2* 9.2*  HCT 32.4* 32.0* 30.0* 29.3*  MCV 90.8  --   --  89.6  PLT 242  --   --  035    Basic Metabolic Panel: Recent Labs  Lab 05/11/18 1820 05/11/18 1833 05/11/18 2054 05/12/18 0323  NA 138 138 140 139  K 4.8 4.8 4.7 4.3  CL 104  --   --  109  CO2 23  --   --  21*  GLUCOSE 156*  --   --  103*  BUN 43*  --   --  47*  CREATININE 2.82*  --   --  2.83*  CALCIUM 8.5*  --   --  7.9*   GFR: Estimated Creatinine Clearance: 27.2 mL/min (A) (by C-G formula based on SCr of 2.83 mg/dL (H)). Recent Labs  Lab 05/11/18 1820 05/12/18 0323  WBC 16.2* 11.2*  LATICACIDVEN 1.5  --     Liver Function Tests: Recent Labs  Lab 05/11/18 1820  AST 24  ALT 34  ALKPHOS 107  BILITOT 0.7  PROT 7.0  ALBUMIN 2.4*   No results for input(s): LIPASE, AMYLASE in the last 168 hours. No results for input(s): AMMONIA in the last 168 hours.  ABG    Component Value Date/Time   PHART 7.359 05/11/2018 2054   PCO2ART 39.3 05/11/2018 2054   PO2ART 56.0 (L) 05/11/2018 2054   HCO3 22.2 05/11/2018 2054   TCO2 23 05/11/2018 2054   ACIDBASEDEF 3.0 (H) 05/11/2018 2054   O2SAT 88.0 05/11/2018 2054     Coagulation Profile: Recent Labs  Lab 05/12/18 0323  INR 1.28    Cardiac Enzymes: Recent Labs  Lab  05/12/18 0010 05/12/18 0323  TROPONINI 10.31* 17.36*    HbA1C: Hgb A1c MFr Bld  Date/Time Value Ref Range Status  05/12/2018 03:23 AM 8.3 (H) 4.8 - 5.6 % Final    Comment:    (NOTE) Pre diabetes:          5.7%-6.4% Diabetes:              >6.4% Glycemic control for   <7.0% adults with diabetes   08/19/2017 03:19 AM 9.2 (H) 4.8 - 5.6 % Final    Comment:    (NOTE) Pre diabetes:          5.7%-6.4% Diabetes:              >6.4% Glycemic control for   <7.0% adults with diabetes     CBG: Recent Labs  Lab 05/12/18 0042  GLUCAP 121*   Attending Note:  58 year old with extensive PMH to include CVA,  HTN, CHF, CKD and a SNF resident who is DNR presenting to PCCM with respiratory failure from pulmonary edema after a NSTEMI.  Patient was placed on BiPAP overnight for respiratory failure and PCCM was asked to consult.  On exam, he continues to have crackles and is somewhat lethargic but easily arousable.  I reviewed CXR myself, bilateral pulmonary edema noted.  Discussed with Dr. Martinique.  Patient is not a candidate for any interventions at this time.  He is a full DNR, BiPAP only.  After discussion and given patient's wishes, will not intubate and manage medically.  If fails BiPAP then transition to comfort.  PCCM will continue to follow.  The patient is critically ill with multiple organ systems failure and requires high complexity decision making for assessment and support, frequent evaluation and titration of therapies, application of advanced monitoring technologies and extensive interpretation of multiple databases.   Critical Care Time devoted to patient care services described in this note is  45  Minutes. This time reflects time of care of this signee Dr Jennet Maduro. This critical care time does not reflect procedure time, or teaching time or supervisory time of PA/NP/Med student/Med Resident etc but could involve care discussion time.  Rush Farmer, M.D. [facility]  Pulmonary/Critical Care Medicine. Pager: [phone]. After hours pager: [phone].

## 2018-05-12 NOTE — Progress Notes (Signed)
  Echocardiogram 2D Echocardiogram has been performed.  William [name] 05/12/2018, 10:36 AM

## 2018-05-12 NOTE — Progress Notes (Signed)
Bilateral lower extremity venous duplex completed - Preliminary results - negative. Full report in Chart review CV Proc. [place] Taeshawn Helfman,RVS 05/12/2018, 3:50 PM

## 2018-05-12 NOTE — Progress Notes (Signed)
ANTICOAGULATION CONSULT NOTE - Follow-up Consult  Pharmacy Consult for Heparin Indication: chest pain/ACS  Allergies  Allergen Reactions  . Sulfamethoxazole     REACTION: rash    Patient Measurements: Height: 5\' 8"  (172.7 cm) Weight: 180 lb (81.6 kg) IBW/kg (Calculated) : 68.4  Vital Signs: Temp: 98.2 F (36.8 C) (01/23 0000) Temp Source: Axillary (01/23 0000) BP: 133/74 (01/23 0430) Pulse Rate: 67 (01/23 0430)  Labs: Recent Labs    05/11/18 1820 05/11/18 1833 05/11/18 2054 05/12/18 0010 05/12/18 0323  HGB 10.2* 10.9* 10.2*  --  9.2*  HCT 32.4* 32.0* 30.0*  --  29.3*  PLT 242  --   --   --  217  APTT  --   --   --   --  50*  LABPROT  --   --   --   --  15.9*  INR  --   --   --   --  1.28  HEPARINUNFRC  --   --   --   --  <0.10*  CREATININE 2.82*  --   --   --  2.83*  TROPONINI  --   --   --  10.31* 17.36*    Estimated Creatinine Clearance: 27.2 mL/min (A) (by C-G formula based on SCr of 2.83 mg/dL (H)).   Medical History: Past Medical History:  Diagnosis Date  . CEREBROVASCULAR ACCIDENT, HX OF 09/28/2006   2007 affected left side and peripheral vision  . DIABETES MELLITUS, TYPE II 09/28/2006  . GERD (gastroesophageal reflux disease)   . HYPERLIPIDEMIA 09/28/2006  . HYPERTENSION 09/28/2006  . Learning disability   . Stroke [facility]) years ago    Assessment: 58 year old male with history of diabetes, hypertension, hyperlipidemia, prior stroke brought here via EMS from [place] for evaluation of altered mental status.  Pt on heparin gtt for ACS (Trop up to 17.36) and r/o PE. VQ scan pending.  Heparin level undetectable on 1000 units/hr. Hgb down to 9.2. No issues with line or bleeding reported per RN  Goal of Therapy:  Heparin level 0.3-0.7 units/ml Monitor platelets by anticoagulation protocol: Yes   Plan:  Rebolus heparin 2500 units Increase heparin infusion to 1300 units/hr Will f/u 8hr heparin level  Sherlon Handing, PharmD, BCPS Clinical  pharmacist  **Pharmacist phone directory can now be found on [email or web address] ([email or web address]).  Listed under [place]. 05/12/2018,4:48 AM

## 2018-05-12 NOTE — Progress Notes (Signed)
Pt ordered continuous BIPAP RT found pt not on BIPAP as ordered. Pt WOB was increased with a SPO2 of 83 upon arrival to pt room. RT placed pt on BIPAP. Pts RR was  31 at time of application. RT Will continue to monitor.

## 2018-05-13 ENCOUNTER — Inpatient Hospital Stay (HOSPITAL_COMMUNITY): Payer: Medicare Other

## 2018-05-13 DIAGNOSIS — E876 Hypokalemia: Secondary | ICD-10-CM

## 2018-05-13 DIAGNOSIS — J810 Acute pulmonary edema: Secondary | ICD-10-CM

## 2018-05-13 DIAGNOSIS — J811 Chronic pulmonary edema: Secondary | ICD-10-CM

## 2018-05-13 LAB — CBC
HCT: 28.4 % — ABNORMAL LOW (ref 39.0–52.0)
Hemoglobin: 9 g/dL — ABNORMAL LOW (ref 13.0–17.0)
MCH: 27.9 pg (ref 26.0–34.0)
MCHC: 31.7 g/dL (ref 30.0–36.0)
MCV: 87.9 fL (ref 80.0–100.0)
Platelets: 226 10*3/uL (ref 150–400)
RBC: 3.23 MIL/uL — ABNORMAL LOW (ref 4.22–5.81)
RDW: 13.2 % (ref 11.5–15.5)
WBC: 9.4 10*3/uL (ref 4.0–10.5)
nRBC: 0 % (ref 0.0–0.2)

## 2018-05-13 LAB — GLUCOSE, CAPILLARY
Glucose-Capillary: 117 mg/dL — ABNORMAL HIGH (ref 70–99)
Glucose-Capillary: 132 mg/dL — ABNORMAL HIGH (ref 70–99)
Glucose-Capillary: 251 mg/dL — ABNORMAL HIGH (ref 70–99)

## 2018-05-13 LAB — BASIC METABOLIC PANEL
Anion gap: 12 (ref 5–15)
BUN: 53 mg/dL — ABNORMAL HIGH (ref 6–20)
CO2: 22 mmol/L (ref 22–32)
Calcium: 8.1 mg/dL — ABNORMAL LOW (ref 8.9–10.3)
Chloride: 101 mmol/L (ref 98–111)
Creatinine, Ser: 3.08 mg/dL — ABNORMAL HIGH (ref 0.61–1.24)
GFR calc Af Amer: 24 mL/min — ABNORMAL LOW (ref >60)
GFR calc non Af Amer: 21 mL/min — ABNORMAL LOW (ref >60)
Glucose, Bld: 144 mg/dL — ABNORMAL HIGH (ref 70–99)
Potassium: 3.6 mmol/L (ref 3.5–5.1)
SODIUM: 135 mmol/L (ref 135–145)

## 2018-05-13 LAB — PHOSPHORUS: Phosphorus: 3.5 mg/dL (ref 2.5–4.6)

## 2018-05-13 LAB — MAGNESIUM: MAGNESIUM: 1.8 mg/dL (ref 1.7–2.4)

## 2018-05-13 LAB — HEPARIN LEVEL (UNFRACTIONATED)
HEPARIN UNFRACTIONATED: 0.18 [IU]/mL — AB (ref 0.30–0.70)
Heparin Unfractionated: 0.38 IU/mL (ref 0.30–0.70)
Heparin Unfractionated: 0.43 IU/mL (ref 0.30–0.70)

## 2018-05-13 MED ORDER — HYDRALAZINE HCL 25 MG PO TABS
25.0000 mg | ORAL_TABLET | Freq: Three times a day (TID) | ORAL | Status: DC
  Administered 2018-05-13 – 2018-05-15 (×7): 25 mg via ORAL
  Filled 2018-05-13 (×7): qty 1

## 2018-05-13 MED ORDER — METOLAZONE 5 MG PO TABS
10.0000 mg | ORAL_TABLET | Freq: Once | ORAL | Status: AC
  Administered 2018-05-13: 10 mg via ORAL
  Filled 2018-05-13: qty 2

## 2018-05-13 MED ORDER — POTASSIUM CHLORIDE CRYS ER 20 MEQ PO TBCR
40.0000 meq | EXTENDED_RELEASE_TABLET | Freq: Four times a day (QID) | ORAL | Status: AC
  Administered 2018-05-13 (×2): 40 meq via ORAL
  Filled 2018-05-13 (×2): qty 2

## 2018-05-13 MED ORDER — ISOSORBIDE MONONITRATE ER 60 MG PO TB24
60.0000 mg | ORAL_TABLET | Freq: Every day | ORAL | Status: DC
  Administered 2018-05-13 – 2018-05-18 (×6): 60 mg via ORAL
  Filled 2018-05-13 (×6): qty 1

## 2018-05-13 MED ORDER — HEPARIN BOLUS VIA INFUSION
3000.0000 [IU] | Freq: Once | INTRAVENOUS | Status: AC
  Administered 2018-05-13: 3000 [IU] via INTRAVENOUS
  Filled 2018-05-13: qty 3000

## 2018-05-13 MED ORDER — HYDRALAZINE HCL 20 MG/ML IJ SOLN
10.0000 mg | Freq: Four times a day (QID) | INTRAMUSCULAR | Status: DC | PRN
  Administered 2018-05-15: 10 mg via INTRAVENOUS
  Filled 2018-05-13: qty 1

## 2018-05-13 MED ORDER — CARVEDILOL 3.125 MG PO TABS
3.1250 mg | ORAL_TABLET | Freq: Two times a day (BID) | ORAL | Status: DC
  Administered 2018-05-13 (×2): 3.125 mg via ORAL
  Filled 2018-05-13 (×2): qty 1

## 2018-05-13 NOTE — Progress Notes (Signed)
ANTICOAGULATION CONSULT NOTE - Follow-up Consult  Pharmacy Consult for Heparin Indication: chest pain/ACS  Allergies  Allergen Reactions  . Sulfamethoxazole     REACTION: rash    Patient Measurements: Height: 5\' 8"  (172.7 cm) Weight: 182 lb 5.1 oz (82.7 kg) IBW/kg (Calculated) : 68.4  Vital Signs: Temp: 98.5 F (36.9 C) (01/24 1134) Temp Source: Oral (01/24 1134) BP: 150/70 (01/24 1107) Pulse Rate: 73 (01/24 1125)  Labs: Recent Labs    05/11/18 1820  05/11/18 2054 05/12/18 0010  05/12/18 0323 05/12/18 0617 05/12/18 1432 05/13/18 0225 05/13/18 1139  HGB 10.2*   < > 10.2*  --   --  9.2*  --   --  9.0*  --   HCT 32.4*   < > 30.0*  --   --  29.3*  --   --  28.4*  --   PLT 242  --   --   --   --  217  --   --  226  --   APTT  --   --   --   --   --  50*  --   --   --   --   LABPROT  --   --   --   --   --  15.9*  --   --   --   --   INR  --   --   --   --   --  1.28  --   --   --   --   HEPARINUNFRC  --   --   --   --    < > <0.10*  --  0.18* 0.18* 0.38  CREATININE 2.82*  --   --   --   --  2.83*  --  3.22* 3.08*  --   TROPONINI  --   --   --  10.31*  --  17.36* 18.26*  --   --   --    < > = values in this interval not displayed.    Estimated Creatinine Clearance: 27.1 mL/min (A) (by C-G formula based on SCr of 3.08 mg/dL (H)).   Medical History: Past Medical History:  Diagnosis Date  . CEREBROVASCULAR ACCIDENT, HX OF 09/28/2006   2007 affected left side and peripheral vision  . DIABETES MELLITUS, TYPE II 09/28/2006  . GERD (gastroesophageal reflux disease)   . HYPERLIPIDEMIA 09/28/2006  . HYPERTENSION 09/28/2006  . Learning disability   . Stroke [facility]) years ago    Assessment: 58 year old male with history of diabetes, hypertension, hyperlipidemia, prior stroke brought here via EMS from [place] for evaluation of altered mental status. Pt on heparin gtt for ACS (Trop up to 18.26).  Heparin level therapeutic at 0.38 on 1800 units/hr. Hgb slightly down from  9.2 to 9.0. Pltc wnl. Confirmed there are no issues with line or bleeding reported per RN.  Goal of Therapy:  Heparin level 0.3-0.7 units/ml Monitor platelets by anticoagulation protocol: Yes   Plan:  Continue heparin infusion at 1800 units/hr Check confirmatory heparin level in 8 hours  Daily heparin level/CBC Monitor for signs and symptoms of bleeding  Thank you for allowing pharmacy to be a part of this patient's care.  Leron Croak, PharmD PGY1 Pharmacy Resident Phone: [phone]  Please check AMION for all [place] phone numbers 05/13/2018,12:13 PM

## 2018-05-13 NOTE — Progress Notes (Addendum)
NAME:  William [name], MRN:  [id number], DOB:  01-25-1961, LOS: 2 ADMISSION DATE:  05/11/2018, CONSULTATION DATE:  05/11/2018 REFERRING MD:  Dr. Marletta Lor, CHIEF COMPLAINT:  Heartburn   Brief History   William Collymore Prestonis a 58 y.o.malewho was admitted 1/22 with Acute Coronary Syndrome and hypoxic respiratory failure requiring BiPAP. Admitted by cardiology and PCCM asked to assist with management of NIMV  Past Medical History  CVA HTN HLD sCHF (echo from May 2019 with EF 30 - 35%) DM Intellectual delay  Significant Hospital Events   1/22 > admit.  Consults:  PCCM Cardiology   Procedures:  None   Significant Diagnostic Tests:  CT chest 1/22 >pulmonary edema with probable superimposed PNA. Small b/l pleural effusions. V/Q scan 1/22 > Echo 1/23 >LVEF 30-35% with diffuse hypokinesis. New anterolateral, anterior, anteroseptal akinesis LE duplex 1/23 >No evidence of DVT Renal ultrasound > No acute abnormality identified. No hydronephrosis or bladder distention  Micro Data:  Blood1/22> Sputum1/22> RVP1/22>Negative   Antimicrobials:  Vanc 1/22 >1/24 Cefepime 1/22 > 1/24  Interim history/subjective:  Overnight: Was placed back on BiPAP  Today, William [name] reports that he slept well and denies chest pain, SOB, nausea, vomiting, fevers, chills.   Objective   Blood pressure (!) 155/72, pulse 76, temperature 98.7 F (37.1 C), temperature source Oral, resp. rate (!) 23, height 5\' 8"  (1.727 m), weight 82.7 kg, SpO2 91 %.    FiO2 (%):  [40 %-50 %] 40 %   Intake/Output Summary (Last 24 hours) at 05/13/2018 0614 Last data filed at 05/13/2018 0500 Gross per 24 hour  Intake 1741.62 ml  Output 2585 ml  Net -843.38 ml   Filed Weights   05/11/18 1908 05/12/18 0500 05/13/18 0311  Weight: 81.6 kg 85 kg 82.7 kg    Examination: General: In NAD HENT: BiPAP in place, NCAT Lungs: Fine bibasilar crackles Cardiovascular: RRR, no MGR Abdomen: BS+,  NTTP Extremities: No edema Neuro: Awake, alert, conversational, speaks in full sentences.   Resolved Hospital Problem list     Assessment & Plan:  William [name] is a 58 year old gentleman with presumed ischemic cardiomyopathy, systolic heart failure with EF 30 to 35%, hypertension, poorly controlled diabetes mellitus, hyperlipidemia, CVA with residual left lower extremity weakness who initially presented with hypotension and hypoxia requiring BiPAP.  On arrival, he was found to have elevated ST segments on EKG, cardiac enzymes and d-dimer concerning for acute coronary syndrome.  CT chest without contrast was suggestive of pulmonary edema.  #Acute hypoxic respiratory failure  #Acute pulmonary edema from underling CHF, small bilateral pleural effusions -Continue BiPAP (12/6 FiO2 50%), wean as tolerated. He is currently DNR and will thus not intubate.  -Metolazone 10mg  po x1 dose -he had a good response to Laxis and CXR still shows persistent pulmonary edema. We will keep Lasix infusion going and monitor renal function. Echo does not reveal a severely reduced EF and suspicion for cardio-renal syndrome is less at this point.  -Monitor intake and output (UOP 2.7L with net -1L)  -Follow-up chest x-ray  #Acute Coronary Syndrome -EKG with ST elevations in V2-V4 -Serial troponin (17<10<2.9) -Status post Plavix load followed by Plavix 75 mg daily plus aspirin 81 mg daily -Continue heparin gtt -Increase Lipitor to 80 mg daily -serial EKGs -Hold ACEi, ARB, ARNI for now in the setting of AKI -Continue Indur, hydralazine, Coreg  #Suspecting H-CAP, less likey -Initial CT chest revealed pulmonary edema with probable superimposed pneumonia as well as small bilateral pleural effusions -Remained  afebrile overnight, CBC without leukocytosis -Hold empiric antibiotics with vancomycin and cefepime -Follow-up RVP, sputum culture, blood culture  #Elevated D-dimer - etiology unclear. DDx include ACS vs PE  vs Coagulopathy. -PE less likely as he is asymptomatic, LE duplex studies negative for DVT -Continue empiric heparin gtt.  #Acute on chronic kidney disease -Possible etiologies include prerenal due to hypoperfusion from ongoing cardiac insult, Though his Bp has been unremarkable since admission, his initial episode of hypotension could've played a role. Obstruction less likely as renal ultrasound showed no evidence of hydronephrosis.  -sCr 3<3.2<2.8 (baseline 1.7) - Follow-up urine sodium and creatinine  #Hypertension -BP stable  #Diabetes Mellitus -SSI -Will benefit for GLP1 agonist or SGLT2 inhibitor however will require evaluation of PAD with ABI   #Cognitive impairment #Learning disability #Recent stroke - Sister is the primary caregiver and prior to admission he was independent of ADLs.  #Bilateral Upper extremity Rash -Continue to monitor   Best practice:  Diet:NPO. Pain/Anxiety/Delirium protocol (if indicated):N/A. VAP protocol (if indicated):N/A. DVT prophylaxis:heparin gtt. GI prophylaxis:N/A. Glucose control:N/A. Mobility:Bedrest. Code Status:DNR. Family Communication:None available. Disposition:ICU.   Labs   CBC: Recent Labs  Lab 05/11/18 1820 05/11/18 1833 05/11/18 2054 05/12/18 0323 05/13/18 0225  WBC 16.2*  --   --  11.2* 9.4  HGB 10.2* 10.9* 10.2* 9.2* 9.0*  HCT 32.4* 32.0* 30.0* 29.3* 28.4*  MCV 90.8  --   --  89.6 87.9  PLT 242  --   --  217 195    Basic Metabolic Panel: Recent Labs  Lab 05/11/18 1820 05/11/18 1833 05/11/18 2054 05/12/18 0323 05/12/18 0617 05/12/18 1432 05/13/18 0225  NA 138 138 140 139  --  139 135  K 4.8 4.8 4.7 4.3  --  4.1 3.6  CL 104  --   --  109  --  106 101  CO2 23  --   --  21*  --  22 22  GLUCOSE 156*  --   --  103*  --  65* 144*  BUN 43*  --   --  47*  --  51* 53*  CREATININE 2.82*  --   --  2.83*  --  3.22* 3.08*  CALCIUM 8.5*  --   --  7.9*  --  8.2* 8.1*  MG  --   --   --   --  2.0   --  1.8  PHOS  --   --   --  4.2  --   --  3.5   GFR: Estimated Creatinine Clearance: 27.1 mL/William (A) (by C-G formula based on SCr of 3.08 mg/dL (H)). Recent Labs  Lab 05/11/18 1820 05/12/18 0323 05/13/18 0225  WBC 16.2* 11.2* 9.4  LATICACIDVEN 1.5  --   --     Liver Function Tests: Recent Labs  Lab 05/11/18 1820  AST 24  ALT 34  ALKPHOS 107  BILITOT 0.7  PROT 7.0  ALBUMIN 2.4*   No results for input(s): LIPASE, AMYLASE in the last 168 hours. No results for input(s): AMMONIA in the last 168 hours.  ABG    Component Value Date/Time   PHART 7.359 05/11/2018 2054   PCO2ART 39.3 05/11/2018 2054   PO2ART 56.0 (L) 05/11/2018 2054   HCO3 22.2 05/11/2018 2054   TCO2 23 05/11/2018 2054   ACIDBASEDEF 3.0 (H) 05/11/2018 2054   O2SAT 88.0 05/11/2018 2054     Coagulation Profile: Recent Labs  Lab 05/12/18 0323  INR 1.28    Cardiac Enzymes: Recent Labs  Lab 05/12/18 0010 05/12/18 0323 05/12/18 0617  TROPONINI 10.31* 17.36* 18.26*    HbA1C: Hgb A1c MFr Bld  Date/Time Value Ref Range Status  05/12/2018 03:23 AM 8.3 (H) 4.8 - 5.6 % Final    Comment:    (NOTE) Pre diabetes:          5.7%-6.4% Diabetes:              >6.4% Glycemic control for   <7.0% adults with diabetes   08/19/2017 03:19 AM 9.2 (H) 4.8 - 5.6 % Final    Comment:    (NOTE) Pre diabetes:          5.7%-6.4% Diabetes:              >6.4% Glycemic control for   <7.0% adults with diabetes     CBG: Recent Labs  Lab 05/12/18 0042 05/12/18 1716 05/12/18 2212  GLUCAP 121* 100 101*   Attending Note:  58 year old male with PMH of CVA who presents to PCCM with NSTEMI and acute hypoxemic respiratory failure due to pulmonary edema.  No events overnight, just removed BiPAP and seems to be doing well but on high flow O2 at 6L with diffuse crackles.  I reviewed CXR myself, pulmonary edema noted.  1L negative overnight.  Produced 2.8 liters of urine but renal function is deteriorating.  Now on lasix  120 mg IV BID.  Add 10 mg PO zaroxolyn.  Add K-dur.  Ronette Deter.  IS.  PCCM will continue to follow for respiratory failure.  The patient is critically ill with multiple organ systems failure and requires high complexity decision making for assessment and support, frequent evaluation and titration of therapies, application of advanced monitoring technologies and extensive interpretation of multiple databases.   Critical Care Time devoted to patient care services described in this note is  32  Minutes. This time reflects time of care of this signee Dr Jennet Maduro. This critical care time does not reflect procedure time, or teaching time or supervisory time of PA/NP/Med student/Med Resident etc but could involve care discussion time.  Rush Farmer, M.D. [facility] Pulmonary/Critical Care Medicine. Pager: [phone]. After hours pager: [phone].

## 2018-05-13 NOTE — Progress Notes (Signed)
ANTICOAGULATION CONSULT NOTE - Follow-up Consult  Pharmacy Consult for Heparin Indication: chest pain/ACS  Allergies  Allergen Reactions  . Sulfamethoxazole     REACTION: rash    Patient Measurements: Height: 5\' 8"  (172.7 cm) Weight: 182 lb 5.1 oz (82.7 kg) IBW/kg (Calculated) : 68.4  Vital Signs: Temp: 97.4 F (36.3 C) (01/24 1900) Temp Source: Oral (01/24 1900) BP: 147/67 (01/24 2000) Pulse Rate: 73 (01/24 2000)  Labs: Recent Labs    05/11/18 1820  05/11/18 2054 05/12/18 0010  05/12/18 0323 05/12/18 0617 05/12/18 1432 05/13/18 0225 05/13/18 1139 05/13/18 1928  HGB 10.2*   < > 10.2*  --   --  9.2*  --   --  9.0*  --   --   HCT 32.4*   < > 30.0*  --   --  29.3*  --   --  28.4*  --   --   PLT 242  --   --   --   --  217  --   --  226  --   --   APTT  --   --   --   --   --  50*  --   --   --   --   --   LABPROT  --   --   --   --   --  15.9*  --   --   --   --   --   INR  --   --   --   --   --  1.28  --   --   --   --   --   HEPARINUNFRC  --   --   --   --    < > <0.10*  --  0.18* 0.18* 0.38 0.43  CREATININE 2.82*  --   --   --   --  2.83*  --  3.22* 3.08*  --   --   TROPONINI  --   --   --  10.31*  --  17.36* 18.26*  --   --   --   --    < > = values in this interval not displayed.    Estimated Creatinine Clearance: 27.1 mL/min (A) (by C-G formula based on SCr of 3.08 mg/dL (H)).   Medical History: Past Medical History:  Diagnosis Date  . CEREBROVASCULAR ACCIDENT, HX OF 09/28/2006   2007 affected left side and peripheral vision  . DIABETES MELLITUS, TYPE II 09/28/2006  . GERD (gastroesophageal reflux disease)   . HYPERLIPIDEMIA 09/28/2006  . HYPERTENSION 09/28/2006  . Learning disability   . Stroke [facility]) years ago    Assessment: 58 year old male with history of diabetes, hypertension, hyperlipidemia, prior stroke brought here via EMS from [place] for evaluation of altered mental status. Pt on heparin gtt for ACS (Trop up to 18.26).  Repeat heparin  level remains within goal range.  Goal of Therapy:  Heparin level 0.3-0.7 units/ml Monitor platelets by anticoagulation protocol: Yes   Plan:  Continue heparin infusion at 1800 units/hr Daily heparin level/CBC Monitor for signs and symptoms of bleeding   Arrie Senate, PharmD, BCPS Clinical Pharmacist [phone] Please check AMION for all [place] numbers 05/13/2018

## 2018-05-13 NOTE — Progress Notes (Signed)
Progress Note  Patient Name: William [name] Date of Encounter: 05/13/2018  Primary Cardiologist: No primary care provider on file. new  Subjective   Patient on BIPAP. Denies any chest pain. States breathing is OK.   Inpatient Medications    Scheduled Meds: . aspirin  81 mg Oral Daily  . atorvastatin  80 mg Oral q1800  . chlorhexidine  15 mL Mouth Rinse BID  . clopidogrel  75 mg Oral Daily  . insulin aspart  0-5 Units Subcutaneous QHS  . insulin aspart  0-9 Units Subcutaneous TID WC  . isosorbide mononitrate  30 mg Oral Daily  . mouth rinse  15 mL Mouth Rinse q12n4p  . pantoprazole  40 mg Oral Daily   Continuous Infusions: . furosemide 20 mL/hr at 05/12/18 2309  . heparin 1,800 Units/hr (05/13/18 0700)   PRN Meds: hydrALAZINE, ondansetron (ZOFRAN) IV   Vital Signs    Vitals:   05/13/18 0500 05/13/18 0600 05/13/18 0700 05/13/18 0726  BP: (!) 140/122 (!) 155/72    Pulse: 80 76 72 71  Resp: 19 (!) 23 (!) 21 20  Temp:      TempSrc:      SpO2: 95% 91% 98% 98%  Weight:      Height:        Intake/Output Summary (Last 24 hours) at 05/13/2018 0800 Last data filed at 05/13/2018 0700 Gross per 24 hour  Intake 1741.52 ml  Output 2725 ml  Net -983.48 ml   Last 3 Weights 05/13/2018 05/12/2018 05/11/2018  Weight (lbs) 182 lb 5.1 oz 187 lb 6.3 oz 180 lb  Weight (kg) 82.7 kg 85 kg 81.647 kg      Telemetry    NSR, 6 beat run of NSVT - Personally Reviewed  ECG    NSR with old anterior infarct with Q waves V1-4 and poor R wave progression c/w old anterior infarct. ST elevation resolved.  Personally Reviewed  Physical Exam   GEN: WDWM No acute distress.   Neck:  JVD to 8 cm. No bruits. Cardiac: RRR, no murmurs, rubs, or gallops.  Respiratory: bilateral rales.  GI: Soft, nontender, non-distended  MS: No edema; No deformity. Neuro:  Nonfocal  Psych: Normal affect  Skin: multiple bullous and excoriated lesions on arms and legs.   Labs    Chemistry Recent Labs    Lab 05/11/18 1820  05/12/18 0323 05/12/18 1432 05/13/18 0225  NA 138   < > 139 139 135  K 4.8   < > 4.3 4.1 3.6  CL 104  --  109 106 101  CO2 23  --  21* 22 22  GLUCOSE 156*  --  103* 65* 144*  BUN 43*  --  47* 51* 53*  CREATININE 2.82*  --  2.83* 3.22* 3.08*  CALCIUM 8.5*  --  7.9* 8.2* 8.1*  PROT 7.0  --   --   --   --   ALBUMIN 2.4*  --   --   --   --   AST 24  --   --   --   --   ALT 34  --   --   --   --   ALKPHOS 107  --   --   --   --   BILITOT 0.7  --   --   --   --   GFRNONAA 23*  --  23* 20* 21*  GFRAA 27*  --  27* 23* 24*  ANIONGAP 11  --  9 11 12    < > = values in this interval not displayed.     Hematology Recent Labs  Lab 05/11/18 1820  05/11/18 2054 05/12/18 0323 05/13/18 0225  WBC 16.2*  --   --  11.2* 9.4  RBC 3.57*  --   --  3.27* 3.23*  HGB 10.2*   < > 10.2* 9.2* 9.0*  HCT 32.4*   < > 30.0* 29.3* 28.4*  MCV 90.8  --   --  89.6 87.9  MCH 28.6  --   --  28.1 27.9  MCHC 31.5  --   --  31.4 31.7  RDW 13.2  --   --  13.3 13.2  PLT 242  --   --  217 226   < > = values in this interval not displayed.    Cardiac Enzymes Recent Labs  Lab 05/12/18 0010 05/12/18 0323 05/12/18 0617  TROPONINI 10.31* 17.36* 18.26*    Recent Labs  Lab 05/11/18 1829 05/11/18 2137  TROPIPOC 0.62* 2.92*     BNPNo results for input(s): BNP, PROBNP in the last 168 hours.   DDimer  Recent Labs  Lab 05/11/18 1820  DDIMER 7.99*     Radiology    Ct Chest Wo Contrast  Result Date: 05/11/2018 CLINICAL DATA:  58 year old male with hypoxia. EXAM: CT CHEST WITHOUT CONTRAST TECHNIQUE: Multidetector CT imaging of the chest was performed following the standard protocol without IV contrast. COMPARISON:  Chest radiograph dated 05/11/2018 FINDINGS: Evaluation of this exam is limited in the absence of intravenous contrast. Cardiovascular: There is mild cardiomegaly. No significant pericardial effusion. There is multi vessel coronary vascular calcification. There is  hypoattenuation of the cardiac blood pool suggestive of a degree of anemia. Clinical correlation is recommended. There is moderate atherosclerotic calcification of the thoracic aorta. The central pulmonary arteries are grossly unremarkable. Mediastinum/Nodes: Top-normal paratracheal lymph nodes measure up to 10 mm in short axis. Subcarinal lymph node measures 17 mm in short axis. The esophagus is grossly unremarkable. No mediastinal fluid collection. Lungs/Pleura: There are small bilateral pleural effusions. There is diffuse interstitial and interlobular septal prominence with Kerley B-lines as well as patchy areas of central and perihilar ground-glass density. Findings most consistent with pulmonary edema with probable superimposed pneumonia. Clinical correlation is recommended. There is no pneumothorax. The central airways are patent. Upper Abdomen: There are stones within the gallbladder. There is slight irregularity of the liver contour which may represent early changes of cirrhosis. Clinical correlation is recommended. Musculoskeletal: Degenerative changes of the spine. No acute osseous pathology. IMPRESSION: 1. Findings most consistent with pulmonary edema with probable superimposed pneumonia. Clinical correlation is recommended. 2. Small bilateral pleural effusions. 3. Cholelithiasis. Electronically Signed   By: Anner Crete M.D.   On: 05/11/2018 19:59   US Renal  Result Date: 05/12/2018 CLINICAL DATA:  Renal injury. EXAM: RENAL / URINARY TRACT ULTRASOUND COMPLETE COMPARISON:  No prior. FINDINGS: Right Kidney: Renal measurements: 10.7 x 5.7 x 5.3 cm = volume: 168.2 mL. Increased echogenicity. No mass or hydronephrosis visualized. Left Kidney: Renal measurements: 10.6 x 7.0 x 6.7 cm = volume: 258.0 mL. Increased echogenicity. No mass or hydronephrosis visualized. Bladder: Foley catheter in the bladder. Right pleural effusion incidentally noted. IMPRESSION: 1. Increased renal echogenicity noted  bilaterally. These findings consistent chronic medical renal disease. No acute abnormality identified. No hydronephrosis or bladder distention. 2.  Right pleural effusion is incidentally noted. Electronically Signed   By: Marcello Moores  Register   On: 05/12/2018 11:40   Dg  Chest Port 1 View  Result Date: 05/13/2018 CLINICAL DATA:  Pulmonary edema short of breath EXAM: PORTABLE CHEST 1 VIEW COMPARISON:  05/12/2018 FINDINGS: Progression of by lateral airspace disease which is perihilar in location. No effusion. Heart size upper normal. IMPRESSION: Progression of bilateral airspace disease compatible with edema. Electronically Signed   By: Franchot Gallo M.D.   On: 05/13/2018 07:48   Dg Chest Port 1 View  Result Date: 05/12/2018 CLINICAL DATA:  Shortness of breath.  Respiratory failure. EXAM: PORTABLE CHEST 1 VIEW COMPARISON:  May 11, 2018 FINDINGS: No pneumothorax. Increasing bilateral pulmonary opacities, particularly in the bases. Diffuse interstitial prominence. The heart size is borderline. No other interval changes. IMPRESSION: 1. Worsening of bilateral pulmonary opacities, suggested to represent pneumonia superimposed on a background of edema on the CT scan from May 11, 2018. Recommend clinical correlation and follow-up to resolution. Electronically Signed   By: Dorise Bullion III M.D   On: 05/12/2018 08:34   Dg Chest Port 1 View  Result Date: 05/11/2018 CLINICAL DATA:  Shortness of breath, weakness, and vomiting. EXAM: PORTABLE CHEST 1 VIEW COMPARISON:  05/25/2017 FINDINGS: Mild cardiomegaly. Moderate diffuse interstitial infiltrates with Kerley B-lines, consistent with diffuse interstitial edema. No evidence of focal consolidation or significant pleural effusion. IMPRESSION: Cardiomegaly and diffuse interstitial edema, consistent with congestive heart failure. Electronically Signed   By: Earle Gell M.D.   On: 05/11/2018 18:47   Vas [place] Lower Extremity Venous (dvt)  Result Date: 05/12/2018   Lower Venous Study Indications: SOB.  Performing Technologist: Toma Copier RVS  Examination Guidelines: A complete evaluation includes B-mode imaging, spectral Doppler, color Doppler, and power Doppler as needed of all accessible portions of each vessel. Bilateral testing is considered an integral part of a complete examination. Limited examinations for reoccurring indications may be performed as noted.  Right Venous Findings: +---------+---------------+---------+-----------+----------+-------+          CompressibilityPhasicitySpontaneityPropertiesSummary +---------+---------------+---------+-----------+----------+-------+ CFV      Full           Yes      Yes                          +---------+---------------+---------+-----------+----------+-------+ SFJ      Full                                                 +---------+---------------+---------+-----------+----------+-------+ FV Prox  Full           Yes      Yes                          +---------+---------------+---------+-----------+----------+-------+ FV Mid   Full                                                 +---------+---------------+---------+-----------+----------+-------+ FV DistalFull           Yes      Yes                          +---------+---------------+---------+-----------+----------+-------+ PFV      Full           Yes  Yes                          +---------+---------------+---------+-----------+----------+-------+ POP      Full           Yes      Yes                          +---------+---------------+---------+-----------+----------+-------+ PTV      Full                                                 +---------+---------------+---------+-----------+----------+-------+ PERO     Full                                                 +---------+---------------+---------+-----------+----------+-------+ Mild technical difficulty due to involuntary jerking of the  legs  Left Venous Findings: +---------+---------------+---------+-----------+----------+-------+          CompressibilityPhasicitySpontaneityPropertiesSummary +---------+---------------+---------+-----------+----------+-------+ CFV      Full           Yes      Yes                          +---------+---------------+---------+-----------+----------+-------+ SFJ      Full                                                 +---------+---------------+---------+-----------+----------+-------+ FV Prox  Full           Yes      Yes                          +---------+---------------+---------+-----------+----------+-------+ FV Mid   Full                                                 +---------+---------------+---------+-----------+----------+-------+ FV DistalFull           Yes      Yes                          +---------+---------------+---------+-----------+----------+-------+ PFV      Full           Yes      Yes                          +---------+---------------+---------+-----------+----------+-------+ POP      Full           Yes      Yes                          +---------+---------------+---------+-----------+----------+-------+ PTV      Full                                                 +---------+---------------+---------+-----------+----------+-------+  PERO     Full                                                 +---------+---------------+---------+-----------+----------+-------+ Mild technical difficulty due to involuntary jerking of the leg    Summary: Right: There is no evidence of deep vein thrombosis in the lower extremity. No cystic structure found in the popliteal fossa. Left: There is no evidence of deep vein thrombosis in the lower extremity. No cystic structure found in the popliteal fossa.  *See table(s) above for measurements and observations. Electronically signed by Monica Martinez MD on 05/12/2018 at 7:25:49 PM.    Final      Cardiac Studies   Echo: 08/20/17: Study Conclusions  - Left ventricle: Septal apical and inferior wall hypokinesis   regionality suggests CAD. The cavity size was severely dilated.   Wall thickness was increased in a pattern of severe LVH. Systolic   function was moderately to severely reduced. The estimated   ejection fraction was in the range of 30% to 35%. Doppler   parameters are consistent with both elevated ventricular   end-diastolic filling pressure and elevated left atrial filling   pressure. - Atrial septum: No defect or patent foramen ovale was identified.  Echo 05/12/18: Study Conclusions  - Left ventricle: The cavity size was normal. There was moderate   focal basal and mild concentric hypertrophy of the septum.   Systolic function was moderately to severely reduced. The   estimated ejection fraction was in the range of 30% to 35%.   Diffuse hypokinesis. Features are consistent with a pseudonormal   left ventricular filling pattern, with concomitant abnormal   relaxation and increased filling pressure (grade 2 diastolic   dysfunction). No evidence of thrombus, though echo contrast not   used. - Regional wall motion abnormality: Akinesis of the mid-apical   anterior, mid anteroseptal, mid anterolateral, apical lateral,   and apical myocardium. - Aortic valve: Possibly bicuspid; mildly thickened, mildly   calcified leaflets. - Mitral valve: Calcified annulus. - Left atrium: The atrium was moderately to severely dilated.  Impressions:  - Reduced EF, with diffuse hypokinesis. New akinesis in the   anterolateral, anterior, anteroseptal areas from mid wall to   apex. Concerning for LAD infarct or ischemia.   Patient Profile     58 y.o. male former smoker with presumed ICM (EF 30-35%), HTN, poorly controlled DM (last a1c 9.2%), HTN, CVA resulting in LE weakness, cognitive impairment (prior to stroke independent in ADLs) who presents with hypotension and  hypoxia. Also noted frequent "heart burn"   Assessment & Plan    1. Acute on chronic systolic CHF. Known history of CM with EF 30-35%. On losartan HCT and labetalol at facility. Acute CHF related to STEMI and AKI. Still has significant pulmonary edema on CXR. On Lasix 120 mg IV bid. I/O negative 1 liter yesterday with 2700 cc urine output.  Promote negative fluid balance.  Sodium restriction. Continue Bipap support per CCM. Hold ACEi/ARB/HCT now due to AKI. Will increase Imdur and add hydralazine. Start low dose Coreg.   2. STEMI. Patient did have some ST elevation in V3-4 new since prior tracings which also demonstrate evidence of old anterior infarct with Q waves and poor R wave progression V1-5. Troponin increased to 18. Not a candidate for emergent cardiac cath due to AKI and multiple  comorbidities. Will treat with DAPT and IV heparin for at least 48 hours. He is asymptomatic. I don't anticipate invasive evaluation will be an option unless there is substantial improvement in renal function.   3. AKI. Baseline creatinine 1.7 in May 2019. Now 2.85>>3.08. He is responding to IV lasix and is still significantly volume overloaded. Will continue IV lasix and monitor. Renal US c/w medical renal disease. No obstruction.    4. Acute hypoxic respiratory failure secondary to pulmonary edema. Oxygen support with Bipap  5. IDDM. On SSI  6. HTN med adjustment as noted above.  7. Bullous rash. Monitor for now.   8. DNR. Patient has standing DNR. Will review with sister who is primary caregiver and POA  9. History of CVA- essentially wheelchair bound at NF  10. Learning disability.    The patient is critically ill with multiple organ systems failure and requires high complexity decision making for assessment and support, frequent evaluation and titration of therapies, application of advanced monitoring technologies and extensive interpretation of multiple databases.   For questions or updates,  please contact [place] Please consult [email or web address] for contact info under        Signed, Florice Hindle [place], MD  05/13/2018, 8:00 AM

## 2018-05-13 NOTE — Progress Notes (Signed)
Patient reexamined this PM, off BiPAP.  May transition to progressive care if ok with cards.  PCCM will sign off.  Rush Farmer, M.D. [facility] Pulmonary/Critical Care Medicine. Pager: [phone]. After hours pager: [phone].

## 2018-05-13 NOTE — Progress Notes (Signed)
Pt order for BIPAP PRN. Pt not in need of BIPAP at this time. RT will continue to monitor.

## 2018-05-13 NOTE — Progress Notes (Signed)
ANTICOAGULATION CONSULT NOTE - Follow Up Consult  Pharmacy Consult for heparin Indication: STEMI and r/o VTE  Labs: Recent Labs    05/11/18 1820  05/11/18 2054 05/12/18 0010 05/12/18 0323 05/12/18 0617 05/12/18 1432 05/13/18 0225  HGB 10.2*   < > 10.2*  --  9.2*  --   --  9.0*  HCT 32.4*   < > 30.0*  --  29.3*  --   --  28.4*  PLT 242  --   --   --  217  --   --  226  APTT  --   --   --   --  50*  --   --   --   LABPROT  --   --   --   --  15.9*  --   --   --   INR  --   --   --   --  1.28  --   --   --   HEPARINUNFRC  --   --   --   --  <0.10*  --  0.18* 0.18*  CREATININE 2.82*  --   --   --  2.83*  --  3.22* 3.08*  TROPONINI  --   --   --  10.31* 17.36* 18.26*  --   --    < > = values in this interval not displayed.    Assessment: 58yo male remains subtherapeutic on heparin with no change in level despite rate change; no gtt issues or signs of bleeding per RN.  Goal of Therapy:  Heparin level 0.3-0.7 units/ml   Plan:  Will rebolus with heparin 3000 units and increase heparin gtt by ~3 units/kg/hr to 1800 units/hr and check level in 8 hours.    Wynona Neat, PharmD, BCPS  05/13/2018,3:31 AM

## 2018-05-14 DIAGNOSIS — I214 Non-ST elevation (NSTEMI) myocardial infarction: Principal | ICD-10-CM

## 2018-05-14 LAB — CBC
HCT: 29.3 % — ABNORMAL LOW (ref 39.0–52.0)
Hemoglobin: 9.6 g/dL — ABNORMAL LOW (ref 13.0–17.0)
MCH: 28.5 pg (ref 26.0–34.0)
MCHC: 32.8 g/dL (ref 30.0–36.0)
MCV: 86.9 fL (ref 80.0–100.0)
Platelets: 259 10*3/uL (ref 150–400)
RBC: 3.37 MIL/uL — ABNORMAL LOW (ref 4.22–5.81)
RDW: 13 % (ref 11.5–15.5)
WBC: 9.8 10*3/uL (ref 4.0–10.5)
nRBC: 0 % (ref 0.0–0.2)

## 2018-05-14 LAB — GLUCOSE, CAPILLARY
GLUCOSE-CAPILLARY: 204 mg/dL — AB (ref 70–99)
Glucose-Capillary: 145 mg/dL — ABNORMAL HIGH (ref 70–99)
Glucose-Capillary: 171 mg/dL — ABNORMAL HIGH (ref 70–99)
Glucose-Capillary: 150 mg/dL — ABNORMAL HIGH (ref 70–99)
Glucose-Capillary: 148 mg/dL — ABNORMAL HIGH (ref 70–99)

## 2018-05-14 LAB — BASIC METABOLIC PANEL
Anion gap: 12 (ref 5–15)
BUN: 58 mg/dL — ABNORMAL HIGH (ref 6–20)
CHLORIDE: 99 mmol/L (ref 98–111)
CO2: 25 mmol/L (ref 22–32)
CREATININE: 3.11 mg/dL — AB (ref 0.61–1.24)
Calcium: 8.5 mg/dL — ABNORMAL LOW (ref 8.9–10.3)
GFR calc Af Amer: 24 mL/min — ABNORMAL LOW (ref >60)
GFR calc non Af Amer: 21 mL/min — ABNORMAL LOW (ref >60)
Glucose, Bld: 141 mg/dL — ABNORMAL HIGH (ref 70–99)
Potassium: 4 mmol/L (ref 3.5–5.1)
Sodium: 136 mmol/L (ref 135–145)

## 2018-05-14 LAB — MAGNESIUM: Magnesium: 1.8 mg/dL (ref 1.7–2.4)

## 2018-05-14 LAB — HEPARIN LEVEL (UNFRACTIONATED): Heparin Unfractionated: 0.4 IU/mL (ref 0.30–0.70)

## 2018-05-14 LAB — PHOSPHORUS: Phosphorus: 2.9 mg/dL (ref 2.5–4.6)

## 2018-05-14 MED ORDER — ORAL CARE MOUTH RINSE
15.0000 mL | Freq: Two times a day (BID) | OROMUCOSAL | Status: DC
  Administered 2018-05-14 – 2018-05-18 (×8): 15 mL via OROMUCOSAL

## 2018-05-14 MED ORDER — CARVEDILOL 6.25 MG PO TABS
6.2500 mg | ORAL_TABLET | Freq: Two times a day (BID) | ORAL | Status: DC
  Administered 2018-05-14 – 2018-05-15 (×4): 6.25 mg via ORAL
  Filled 2018-05-14 (×4): qty 1

## 2018-05-14 MED ORDER — FUROSEMIDE 10 MG/ML IJ SOLN
80.0000 mg | Freq: Two times a day (BID) | INTRAMUSCULAR | Status: DC
  Administered 2018-05-14 – 2018-05-15 (×3): 80 mg via INTRAVENOUS
  Filled 2018-05-14 (×3): qty 8

## 2018-05-14 NOTE — Progress Notes (Signed)
Spoke with [name] on the phone (patients sister and HCPOA). She stated the patient was recently been diagnosed with Bullous Pemphigoid, which is a non contagious skin disease (per sister). She wanted to make sure we were continuing to treat it. After looking through the chart from the skilled nursing facility, it appears he was on a Prednisone taper that stopped Jan. 18. Since the blisters are open we can not use topical steroids. Made Dr.Crenshaw aware and he will look at tomorrow when he rounds. Will continue to monitor.  Patient denies any itching currently from the spots and are covered with foam.

## 2018-05-14 NOTE — Progress Notes (Signed)
Progress Note  Patient Name: William [name] Date of Encounter: 05/14/2018  Primary Cardiologist: Dr Martinique  Subjective   Denies CP or dyspnea  Inpatient Medications    Scheduled Meds: . aspirin  81 mg Oral Daily  . atorvastatin  80 mg Oral q1800  . carvedilol  3.125 mg Oral BID WC  . chlorhexidine  15 mL Mouth Rinse BID  . clopidogrel  75 mg Oral Daily  . hydrALAZINE  25 mg Oral Q8H  . insulin aspart  0-5 Units Subcutaneous QHS  . insulin aspart  0-9 Units Subcutaneous TID WC  . isosorbide mononitrate  60 mg Oral Daily  . mouth rinse  15 mL Mouth Rinse q12n4p  . pantoprazole  40 mg Oral Daily   Continuous Infusions: . furosemide Stopped (05/13/18 2244)  . heparin 1,800 Units/hr (05/14/18 0640)   PRN Meds: hydrALAZINE, ondansetron (ZOFRAN) IV   Vital Signs    Vitals:   05/13/18 2300 05/14/18 0000 05/14/18 0300 05/14/18 0400  BP:  (!) 157/73  (!) 153/76  Pulse: 72 73 71 71  Resp: (!) 24 (!) 24 14 16   Temp: 98.3 F (36.8 C)  98.1 F (36.7 C)   TempSrc: Oral  Oral   SpO2: 94% 91% 95% 97%  Weight:    80.6 kg  Height:        Intake/Output Summary (Last 24 hours) at 05/14/2018 0701 Last data filed at 05/14/2018 0640 Gross per 24 hour  Intake 771.73 ml  Output 3115 ml  Net -2343.27 ml   Last 3 Weights 05/14/2018 05/13/2018 05/12/2018  Weight (lbs) 177 lb 11.1 oz 182 lb 5.1 oz 187 lb 6.3 oz  Weight (kg) 80.6 kg 82.7 kg 85 kg      Telemetry    Sinus with PVCs- Personally Reviewed   Physical Exam   GEN: No acute distress.   Neck: supple Cardiac: RRR Respiratory: mild diffuse rhonchi GI: Soft, nontender, non-distended  MS: No edema; diffuse excoriations Neuro:  Nonfocal   Labs    Chemistry Recent Labs  Lab 05/11/18 1820  05/12/18 1432 05/13/18 0225 05/14/18 0356  NA 138   < > 139 135 136  K 4.8   < > 4.1 3.6 4.0  CL 104   < > 106 101 99  CO2 23   < > 22 22 25   GLUCOSE 156*   < > 65* 144* 141*  BUN 43*   < > 51* 53* 58*  CREATININE 2.82*    < > 3.22* 3.08* 3.11*  CALCIUM 8.5*   < > 8.2* 8.1* 8.5*  PROT 7.0  --   --   --   --   ALBUMIN 2.4*  --   --   --   --   AST 24  --   --   --   --   ALT 34  --   --   --   --   ALKPHOS 107  --   --   --   --   BILITOT 0.7  --   --   --   --   GFRNONAA 23*   < > 20* 21* 21*  GFRAA 27*   < > 23* 24* 24*  ANIONGAP 11   < > 11 12 12    < > = values in this interval not displayed.     Hematology Recent Labs  Lab 05/12/18 0323 05/13/18 0225 05/14/18 0356  WBC 11.2* 9.4 9.8  RBC 3.27* 3.23* 3.37*  HGB 9.2* 9.0* 9.6*  HCT 29.3* 28.4* 29.3*  MCV 89.6 87.9 86.9  MCH 28.1 27.9 28.5  MCHC 31.4 31.7 32.8  RDW 13.3 13.2 13.0  PLT 217 226 259    Cardiac Enzymes Recent Labs  Lab 05/12/18 0010 05/12/18 0323 05/12/18 0617  TROPONINI 10.31* 17.36* 18.26*    Recent Labs  Lab 05/11/18 1829 05/11/18 2137  TROPIPOC 0.62* 2.92*      DDimer  Recent Labs  Lab 05/11/18 1820  DDIMER 7.99*     Radiology    US Renal  Result Date: 05/12/2018 CLINICAL DATA:  Renal injury. EXAM: RENAL / URINARY TRACT ULTRASOUND COMPLETE COMPARISON:  No prior. FINDINGS: Right Kidney: Renal measurements: 10.7 x 5.7 x 5.3 cm = volume: 168.2 mL. Increased echogenicity. No mass or hydronephrosis visualized. Left Kidney: Renal measurements: 10.6 x 7.0 x 6.7 cm = volume: 258.0 mL. Increased echogenicity. No mass or hydronephrosis visualized. Bladder: Foley catheter in the bladder. Right pleural effusion incidentally noted. IMPRESSION: 1. Increased renal echogenicity noted bilaterally. These findings consistent chronic medical renal disease. No acute abnormality identified. No hydronephrosis or bladder distention. 2.  Right pleural effusion is incidentally noted. Electronically Signed   By: Marcello Moores  Register   On: 05/12/2018 11:40   Dg Chest Port 1 View  Result Date: 05/13/2018 CLINICAL DATA:  Pulmonary edema short of breath EXAM: PORTABLE CHEST 1 VIEW COMPARISON:  05/12/2018 FINDINGS: Progression of by lateral  airspace disease which is perihilar in location. No effusion. Heart size upper normal. IMPRESSION: Progression of bilateral airspace disease compatible with edema. Electronically Signed   By: Franchot Gallo M.D.   On: 05/13/2018 07:48   Vas [place] Lower Extremity Venous (dvt)  Result Date: 05/12/2018  Lower Venous Study Indications: SOB.  Performing Technologist: Toma Copier RVS  Examination Guidelines: A complete evaluation includes B-mode imaging, spectral Doppler, color Doppler, and power Doppler as needed of all accessible portions of each vessel. Bilateral testing is considered an integral part of a complete examination. Limited examinations for reoccurring indications may be performed as noted.  Right Venous Findings: +---------+---------------+---------+-----------+----------+-------+          CompressibilityPhasicitySpontaneityPropertiesSummary +---------+---------------+---------+-----------+----------+-------+ CFV      Full           Yes      Yes                          +---------+---------------+---------+-----------+----------+-------+ SFJ      Full                                                 +---------+---------------+---------+-----------+----------+-------+ FV Prox  Full           Yes      Yes                          +---------+---------------+---------+-----------+----------+-------+ FV Mid   Full                                                 +---------+---------------+---------+-----------+----------+-------+ FV DistalFull           Yes      Yes                          +---------+---------------+---------+-----------+----------+-------+  PFV      Full           Yes      Yes                          +---------+---------------+---------+-----------+----------+-------+ POP      Full           Yes      Yes                          +---------+---------------+---------+-----------+----------+-------+ PTV      Full                                                  +---------+---------------+---------+-----------+----------+-------+ PERO     Full                                                 +---------+---------------+---------+-----------+----------+-------+ Mild technical difficulty due to involuntary jerking of the legs  Left Venous Findings: +---------+---------------+---------+-----------+----------+-------+          CompressibilityPhasicitySpontaneityPropertiesSummary +---------+---------------+---------+-----------+----------+-------+ CFV      Full           Yes      Yes                          +---------+---------------+---------+-----------+----------+-------+ SFJ      Full                                                 +---------+---------------+---------+-----------+----------+-------+ FV Prox  Full           Yes      Yes                          +---------+---------------+---------+-----------+----------+-------+ FV Mid   Full                                                 +---------+---------------+---------+-----------+----------+-------+ FV DistalFull           Yes      Yes                          +---------+---------------+---------+-----------+----------+-------+ PFV      Full           Yes      Yes                          +---------+---------------+---------+-----------+----------+-------+ POP      Full           Yes      Yes                          +---------+---------------+---------+-----------+----------+-------+ PTV      Full                                                 +---------+---------------+---------+-----------+----------+-------+  PERO     Full                                                 +---------+---------------+---------+-----------+----------+-------+ Mild technical difficulty due to involuntary jerking of the leg    Summary: Right: There is no evidence of deep vein thrombosis in the lower extremity. No cystic structure  found in the popliteal fossa. Left: There is no evidence of deep vein thrombosis in the lower extremity. No cystic structure found in the popliteal fossa.  *See table(s) above for measurements and observations. Electronically signed by Monica Martinez MD on 05/12/2018 at 7:25:49 PM.    Final     Patient Profile     57 y.o. male former smoker with presumed ICM (EF 30-35%), HTN, poorly controlled DM (last a1c 9.2%), HTN, CVA resulting in LE weakness, cognitive impairment (prior to stroke independent in ADLs) who presents with hypotension and hypoxia. Also noted frequent "heart burn".  Patient ruled in for myocardial infarction.  Echocardiogram shows ejection fraction 30 to 35% and moderate to severe left atrial enlargement.  Assessment & Plan    1 non-ST elevation myocardial infarction-patient denies chest pain.  Presently not a candidate for cardiac catheterization given acute on chronic renal insufficiency and other comorbidities.  We will continue aspirin, Plavix, heparin, beta-blocker and statin.  2 acute on chronic systolic congestive heart failure-volume status appears to be improving.  Decrease Lasix to 80 mg twice daily.  Follow renal function.  Probable ischemic cardiomyopathy.  Increase carvedilol to 6.25 mg twice daily.  Continue hydralazine/nitrates.  No ARB or Entresto given severity of renal insufficiency.  3 acute on chronic stage III kidney disease-creatinine 3.11 today.  Continue to follow with diuresis.  Likely secondary to chronic diabetes mellitus.  4 no CODE BLUE status  5 history of CVA  6 hypertension-patient's blood pressure is mildly elevated.  Increase carvedilol as outlined above.  Advance hydralazine later as needed.  7 history of learning disability  For questions or updates, please contact [place] Please consult [email or web address] for contact info under        Signed, Kirk Ruths, MD  05/14/2018, 7:01 AM

## 2018-05-14 NOTE — Progress Notes (Signed)
This note also relates to the following rows which could not be included: Pulse Rate - Cannot attach notes to unvalidated device data Resp - Cannot attach notes to unvalidated device data SpO2 - Cannot attach notes to unvalidated device data  Bipap not indicated at this time. RT will monitor.

## 2018-05-14 NOTE — Progress Notes (Signed)
Patient transferred to 6E23 via bed. Sister present at transfer. Report given to receiving nurse. Patient alert and oriented at his baseline and in stable condition. No personal belongings at bedside.

## 2018-05-14 NOTE — Progress Notes (Signed)
ANTICOAGULATION CONSULT NOTE - Follow-up Consult  Pharmacy Consult for Heparin Indication: chest pain/ACS  Allergies  Allergen Reactions  . Sulfamethoxazole     REACTION: rash    Patient Measurements: Height: 5\' 8"  (172.7 cm) Weight: 177 lb 11.1 oz (80.6 kg) IBW/kg (Calculated) : 68.4  Vital Signs: Temp: 98.2 F (36.8 C) (01/25 0740) Temp Source: Oral (01/25 0740) BP: 153/76 (01/25 0400) Pulse Rate: 71 (01/25 0400)  Labs: Recent Labs    05/12/18 0010  05/12/18 0323 05/12/18 0617 05/12/18 1432 05/13/18 0225 05/13/18 1139 05/13/18 1928 05/14/18 0356  HGB  --   --  9.2*  --   --  9.0*  --   --  9.6*  HCT  --   --  29.3*  --   --  28.4*  --   --  29.3*  PLT  --   --  217  --   --  226  --   --  259  APTT  --   --  50*  --   --   --   --   --   --   LABPROT  --   --  15.9*  --   --   --   --   --   --   INR  --   --  1.28  --   --   --   --   --   --   HEPARINUNFRC  --    < > <0.10*  --  0.18* 0.18* 0.38 0.43 0.40  CREATININE  --   --  2.83*  --  3.22* 3.08*  --   --  3.11*  TROPONINI 10.31*  --  17.36* 18.26*  --   --   --   --   --    < > = values in this interval not displayed.    Estimated Creatinine Clearance: 24.7 mL/min (A) (by C-G formula based on SCr of 3.11 mg/dL (H)).   Medical History: Past Medical History:  Diagnosis Date  . CEREBROVASCULAR ACCIDENT, HX OF 09/28/2006   2007 affected left side and peripheral vision  . DIABETES MELLITUS, TYPE II 09/28/2006  . GERD (gastroesophageal reflux disease)   . HYPERLIPIDEMIA 09/28/2006  . HYPERTENSION 09/28/2006  . Learning disability   . Stroke [facility] years ago    Assessment: 58 year old male with history of diabetes, hypertension, hyperlipidemia, prior stroke brought here via EMS from [place] for evaluation of altered mental status. Pt on heparin gtt for ACS (Trop up to 18.26).  Heparin level remains therapeutic at 0.4 on heparin 1800 units/hr. Hb low but stable. No signs/symptoms of bleeding or  issues with infusion reported by nursing.   Goal of Therapy:  Heparin level 0.3-0.7 units/ml Monitor platelets by anticoagulation protocol: Yes   Plan:  Continue heparin infusion at 1800 units/hr Daily heparin level/CBC Monitor for signs and symptoms of bleeding   Claiborne Billings, PharmD PGY2 Cardiology Pharmacy Resident Please check AMION for all Pharmacist numbers by unit 05/14/2018 7:55 AM

## 2018-05-14 NOTE — Plan of Care (Signed)
Due to the patients mental capacity, he isn't able to learn and manage his health. For this reson he lives in a skilled nursing facility.

## 2018-05-15 ENCOUNTER — Encounter (HOSPITAL_COMMUNITY): Payer: Self-pay | Admitting: *Deleted

## 2018-05-15 LAB — CBC
HCT: 28.4 % — ABNORMAL LOW (ref 39.0–52.0)
Hemoglobin: 9.5 g/dL — ABNORMAL LOW (ref 13.0–17.0)
MCH: 28.6 pg (ref 26.0–34.0)
MCHC: 33.5 g/dL (ref 30.0–36.0)
MCV: 85.5 fL (ref 80.0–100.0)
Platelets: 258 10*3/uL (ref 150–400)
RBC: 3.32 MIL/uL — ABNORMAL LOW (ref 4.22–5.81)
RDW: 13 % (ref 11.5–15.5)
WBC: 9.1 10*3/uL (ref 4.0–10.5)
nRBC: 0 % (ref 0.0–0.2)

## 2018-05-15 LAB — BASIC METABOLIC PANEL
Anion gap: 16 — ABNORMAL HIGH (ref 5–15)
BUN: 64 mg/dL — ABNORMAL HIGH (ref 6–20)
CO2: 25 mmol/L (ref 22–32)
Calcium: 8.4 mg/dL — ABNORMAL LOW (ref 8.9–10.3)
Chloride: 93 mmol/L — ABNORMAL LOW (ref 98–111)
Creatinine, Ser: 3.19 mg/dL — ABNORMAL HIGH (ref 0.61–1.24)
GFR calc Af Amer: 23 mL/min — ABNORMAL LOW (ref >60)
GFR calc non Af Amer: 20 mL/min — ABNORMAL LOW (ref >60)
Glucose, Bld: 150 mg/dL — ABNORMAL HIGH (ref 70–99)
Potassium: 3.3 mmol/L — ABNORMAL LOW (ref 3.5–5.1)
Sodium: 134 mmol/L — ABNORMAL LOW (ref 135–145)

## 2018-05-15 LAB — GLUCOSE, CAPILLARY
Glucose-Capillary: 133 mg/dL — ABNORMAL HIGH (ref 70–99)
Glucose-Capillary: 193 mg/dL — ABNORMAL HIGH (ref 70–99)
Glucose-Capillary: 194 mg/dL — ABNORMAL HIGH (ref 70–99)
Glucose-Capillary: 172 mg/dL — ABNORMAL HIGH (ref 70–99)

## 2018-05-15 LAB — HEPARIN LEVEL (UNFRACTIONATED): Heparin Unfractionated: 0.39 IU/mL (ref 0.30–0.70)

## 2018-05-15 MED ORDER — FUROSEMIDE 80 MG PO TABS
80.0000 mg | ORAL_TABLET | Freq: Every day | ORAL | Status: DC
  Administered 2018-05-16 – 2018-05-17 (×2): 80 mg via ORAL
  Filled 2018-05-15 (×2): qty 1

## 2018-05-15 MED ORDER — HYDRALAZINE HCL 50 MG PO TABS
50.0000 mg | ORAL_TABLET | Freq: Three times a day (TID) | ORAL | Status: DC
  Administered 2018-05-15 – 2018-05-18 (×10): 50 mg via ORAL
  Filled 2018-05-15 (×10): qty 1

## 2018-05-15 MED ORDER — POTASSIUM CHLORIDE CRYS ER 20 MEQ PO TBCR
40.0000 meq | EXTENDED_RELEASE_TABLET | Freq: Once | ORAL | Status: AC
  Administered 2018-05-15: 40 meq via ORAL
  Filled 2018-05-15: qty 2

## 2018-05-15 MED ORDER — HEPARIN SODIUM (PORCINE) 5000 UNIT/ML IJ SOLN
5000.0000 [IU] | Freq: Three times a day (TID) | INTRAMUSCULAR | Status: DC
  Administered 2018-05-15 – 2018-05-18 (×10): 5000 [IU] via SUBCUTANEOUS
  Filled 2018-05-15 (×10): qty 1

## 2018-05-15 NOTE — Clinical Social Work Note (Signed)
Clinical Social Work Assessment  Patient Details  Name: ADELL PANEK MRN: [id number] Date of Birth: 05/20/1960  Date of referral:  05/15/18               Reason for consult:  Facility Placement                Permission sought to share information with:  Family Supports, Customer service manager Permission granted to share information::     Name::     [name]  Agency::  [employer]  Relationship::  Sister, Administrator, arts Information:     Housing/Transportation Living arrangements for the past 2 months:  [place] of Information:  Power of [employer], Siblings Patient Interpreter Needed:  None Criminal Activity/Legal Involvement Pertinent to Current Situation/Hospitalization:  No - Comment as needed Significant Relationships:  Siblings Lives with:  Facility Resident Do you feel safe going back to the place where you live?  Yes Need for family participation in patient care:  No (Coment)  Care giving concerns:  Pt is alert and oriented however, pt was asleep. [name]/HCPOA present at bedside.   Social Worker assessment / plan:  CSW spoke with pt's sister. [name] confirmed pt is from [place] and was  there long term. [name] states the plan is for him to return at d/c. CSW to follow up with facility.  Employment status:  Disabled (Comment on whether or not currently receiving Disability) Insurance information:  Managed Medicare PT Recommendations:  Not assessed at this time Information / Referral to community resources:  [place]  Patient/Family's Response to care:  [name] verbalized understanding of CSW role and expressed appreciation for support. [name] denies any concern regarding pt care at this time.   Patient/Family's Understanding of and Emotional Response to Diagnosis, Current Treatment, and Prognosis:  [name] understanding of pt's physical limitations. [name] understanding and  agreeable to pt to returning to SNF at d/c. [name] denies any further questions or concerns.   Emotional Assessment Appearance:  Appears stated age Attitude/Demeanor/Rapport:  Unable to Assess Affect (typically observed):  Unable to Assess Orientation:  Oriented to Self, Oriented to Place, Oriented to  Time, Oriented to Situation(pt was asleep) Alcohol / Substance use:  Not Applicable Psych involvement (Current and /or in the community):  No (Comment)  Discharge Needs  Concerns to be addressed:  No discharge needs identified Readmission within the last 30 days:  No Current discharge risk:  Dependent with Mobility Barriers to Discharge:  Continued Medical Work up, [employer], LCSW 05/15/2018, 3:18 PM

## 2018-05-15 NOTE — Progress Notes (Signed)
I bladder scanned patient since no void after catheter d/c'd. I got 0cc on bladder scan. Will pass to next shift

## 2018-05-15 NOTE — Progress Notes (Signed)
Progress Note  Patient Name: William [name] Date of Encounter: 05/15/2018  Primary Cardiologist: Dr Martinique  Subjective   Pt denies CP or dyspnea  Inpatient Medications    Scheduled Meds: . aspirin  81 mg Oral Daily  . atorvastatin  80 mg Oral q1800  . carvedilol  6.25 mg Oral BID WC  . clopidogrel  75 mg Oral Daily  . furosemide  80 mg Intravenous Q12H  . hydrALAZINE  25 mg Oral Q8H  . insulin aspart  0-5 Units Subcutaneous QHS  . insulin aspart  0-9 Units Subcutaneous TID WC  . isosorbide mononitrate  60 mg Oral Daily  . mouth rinse  15 mL Mouth Rinse BID  . pantoprazole  40 mg Oral Daily   Continuous Infusions: . heparin 1,800 Units/hr (05/14/18 2056)   PRN Meds: hydrALAZINE, ondansetron (ZOFRAN) IV   Vital Signs    Vitals:   05/14/18 2019 05/15/18 0009 05/15/18 0536 05/15/18 0754  BP:  (!) 177/69 (!) 166/75 (!) 149/76  Pulse: 65 71 71 73  Resp: (!) 28   (!) 22  Temp:  98.5 F (36.9 C) (!) 97.3 F (36.3 C)   TempSrc:  Oral Oral   SpO2: 92% 92% 94% 99%  Weight:   83.8 kg   Height:        Intake/Output Summary (Last 24 hours) at 05/15/2018 1011 Last data filed at 05/15/2018 0649 Gross per 24 hour  Intake 769 ml  Output 1950 ml  Net -1181 ml   Last 3 Weights 05/15/2018 05/14/2018 05/13/2018  Weight (lbs) 184 lb 11.9 oz 177 lb 11.1 oz 182 lb 5.1 oz  Weight (kg) 83.8 kg 80.6 kg 82.7 kg      Telemetry    Sinus with PVCs- Personally Reviewed   Physical Exam   GEN: No acute distress. WD WN Neck: supple, NAD Cardiac: RRR, no murmur Respiratory: mildly diminished bases GI: Soft, NT/ND MS: No edema; diffuse excoriations on lower ext and abdomen Neuro:  Grossly intact  Labs    Chemistry Recent Labs  Lab 05/11/18 1820  05/13/18 0225 05/14/18 0356 05/15/18 0351  NA 138   < > 135 136 134*  K 4.8   < > 3.6 4.0 3.3*  CL 104   < > 101 99 93*  CO2 23   < > 22 25 25   GLUCOSE 156*   < > 144* 141* 150*  BUN 43*   < > 53* 58* 64*  CREATININE 2.82*    < > 3.08* 3.11* 3.19*  CALCIUM 8.5*   < > 8.1* 8.5* 8.4*  PROT 7.0  --   --   --   --   ALBUMIN 2.4*  --   --   --   --   AST 24  --   --   --   --   ALT 34  --   --   --   --   ALKPHOS 107  --   --   --   --   BILITOT 0.7  --   --   --   --   GFRNONAA 23*   < > 21* 21* 20*  GFRAA 27*   < > 24* 24* 23*  ANIONGAP 11   < > 12 12 16*   < > = values in this interval not displayed.     Hematology Recent Labs  Lab 05/13/18 0225 05/14/18 0356 05/15/18 0351  WBC 9.4 9.8 9.1  RBC 3.23* 3.37*  3.32*  HGB 9.0* 9.6* 9.5*  HCT 28.4* 29.3* 28.4*  MCV 87.9 86.9 85.5  MCH 27.9 28.5 28.6  MCHC 31.7 32.8 33.5  RDW 13.2 13.0 13.0  PLT 226 259 258    Cardiac Enzymes Recent Labs  Lab 05/12/18 0010 05/12/18 0323 05/12/18 0617  TROPONINI 10.31* 17.36* 18.26*    Recent Labs  Lab 05/11/18 1829 05/11/18 2137  TROPIPOC 0.62* 2.92*      DDimer  Recent Labs  Lab 05/11/18 1820  DDIMER 7.99*    Patient Profile     58 y.o. male former smoker with presumed ICM (EF 30-35%), HTN, poorly controlled DM (last a1c 9.2%), HTN, CVA resulting in LE weakness, cognitive impairment (prior to stroke independent in ADLs) who presents with hypotension and hypoxia. Also noted frequent "heart burn".  Patient ruled in for myocardial infarction.  Echocardiogram shows ejection fraction 30 to 35% and moderate to severe left atrial enlargement.  Assessment & Plan    1 non-ST elevation myocardial infarction-patient denies chest pain today.  As outlined in previous notes he is not a candidate for cardiac catheterization given multiple comorbidities including significant renal insufficiency.  We will continue with aspirin, Plavix, beta-blocker and statin.  Change heparin to subcutaneous today.    2 acute on chronic systolic congestive heart failure-patient appears to be euvolemic today.  BUN and creatinine mildly increased compared to yesterday.  Change Lasix to 80 mg by mouth daily.  Follow renal function.   Probable ischemic cardiomyopathy.  Continue present dose of carvedilol.  Continue hydralazine (increased to 50 mg p.o. 3 times daily)/nitrates.  No ARB or Entresto given severity of renal insufficiency.  3 acute on chronic stage III kidney disease-renal function slightly worse today.  Decrease Lasix as outlined above.  Continue to follow.  4 no CODE BLUE status  5 history of CVA  6 hypertension-blood pressure is elevated.  Increase hydralazine as outlined above.  7 history of learning disability  For questions or updates, please contact [place] Please consult [email or web address] for contact info under        Signed, Kirk Ruths, MD  05/15/2018, 10:11 AM

## 2018-05-15 NOTE — NC FL2 (Signed)
[place] LEVEL OF CARE SCREENING TOOL     IDENTIFICATION  Patient Name: William [name] Birthdate: 1960-08-06 Sex: male Admission Date (Current Location): 05/11/2018  [facility] and [place] Number:  Herbalist and Address:  The [place], [place] [address], [place], [place] [address]      Provider Number: [id number]  Attending Physician Name and Address:  Milus Banister, MD  Relative Name and Phone Number:       Current Level of Care: Hospital Recommended Level of Care: [place] Prior Approval Number:    Date Approved/Denied:   PASRR Number:    Discharge Plan: SNF    Current Diagnoses: Patient Active Problem List   Diagnosis Date Noted  . Pulmonary edema   . Hypokalemia   . Acute respiratory failure with hypoxia ([place])   . ST elevation myocardial infarction (STEMI) ([place])   . NSTEMI (non-ST elevated myocardial infarction) ([place])   . Acute pulmonary edema (HCC)   . Systolic heart failure ([place]) 05/11/2018  . Hypertension associated with stage 3 chronic kidney disease due to type 2 diabetes mellitus ([place]) 02/04/2018  . Hypertension associated with diabetes ([place]) 08/31/2017  . Hemiparesis affecting left side as late effect of cerebrovascular accident ([place]) 08/31/2017  . Dyslipidemia associated with type 2 diabetes mellitus ([place]) 08/31/2017  . Type 2 diabetes mellitus with stage 3 chronic kidney disease, with long-term current use of insulin ([place]) 08/31/2017  . Stage 3 chronic renal impairment associated with type 2 diabetes mellitus ([place]) 08/31/2017  . Anemia of chronic disease 08/31/2017  . Stroke (cerebrum) ([place]) 08/19/2017  . Frequent falls   . Urinary incontinence   . Abnormal gait 07/14/2017  . Depression, major, single episode, mild ([place]) 04/23/2017  . Inactivity 02/09/2017  . Iron deficiency anemia 06/25/2015  . CKD (chronic kidney disease), stage III ([place]) 04/30/2014  . Tobacco use disorder 11/21/2013   . GERD without esophagitis 09/25/2013  . Type II diabetes mellitus with renal manifestations ([place]) 09/28/2006  . Hyperlipidemia 09/28/2006  . Essential hypertension 09/28/2006  . History of cardiovascular disorder 09/28/2006    Orientation RESPIRATION BLADDER Height & Weight     Self, Time, Situation, Place  Normal Incontinent Weight: 184 lb 11.9 oz (83.8 kg) Height:  5\' 8"  (172.7 cm)  BEHAVIORAL SYMPTOMS/MOOD NEUROLOGICAL BOWEL NUTRITION STATUS      Incontinent Diet(heart healthy/carb modified, Fluid consistency: Thin; Fluid restriction: 1200 mL Fluid)  AMBULATORY STATUS COMMUNICATION OF NEEDS Skin   Limited Assist Verbally Normal                       Personal Care Assistance Level of Assistance  Dressing, Feeding, Bathing Bathing Assistance: Limited assistance Feeding assistance: Independent Dressing Assistance: Limited assistance     Functional Limitations Info  Sight, Hearing, Speech Sight Info: Adequate Hearing Info: Adequate Speech Info: Adequate    SPECIAL CARE FACTORS FREQUENCY  PT (By licensed PT), OT (By licensed OT)     PT Frequency: 5x OT Frequency: 5x            Contractures Contractures Info: Not present    Additional Factors Info  Code Status, Allergies Code Status Info: DNR Allergies Info: Sulfamethoxazole           Current Medications (05/15/2018):  This is the current hospital active medication list Current Facility-Administered Medications  Medication Dose Route Frequency Provider Last Rate Last Dose  . aspirin chewable tablet 81 mg  81 mg  Oral Daily Martinique, Peter M, MD   81 mg at 05/15/18 0945  . atorvastatin (LIPITOR) tablet 80 mg  80 mg Oral q1800 Martinique, Peter M, MD   80 mg at 05/14/18 2232  . carvedilol (COREG) tablet 6.25 mg  6.25 mg Oral BID WC Lelon Perla, MD   6.25 mg at 05/15/18 0945  . clopidogrel (PLAVIX) tablet 75 mg  75 mg Oral Daily Martinique, Peter M, MD   75 mg at 05/15/18 0945  . furosemide (LASIX) tablet 80 mg   80 mg Oral Daily Lelon Perla, MD      . heparin injection 5,000 Units  5,000 Units Subcutaneous Q8H Lelon Perla, MD   5,000 Units at 05/15/18 1341  . hydrALAZINE (APRESOLINE) injection 10 mg  10 mg Intravenous Q6H PRN Martinique, Peter M, MD   10 mg at 05/15/18 0020  . hydrALAZINE (APRESOLINE) tablet 50 mg  50 mg Oral Q8H Lelon Perla, MD   50 mg at 05/15/18 1341  . insulin aspart (novoLOG) injection 0-5 Units  0-5 Units Subcutaneous QHS Martinique, Peter M, MD   3 Units at 05/13/18 2230  . insulin aspart (novoLOG) injection 0-9 Units  0-9 Units Subcutaneous TID WC Martinique, Peter M, MD   2 Units at 05/15/18 1206  . isosorbide mononitrate (IMDUR) 24 hr tablet 60 mg  60 mg Oral Daily Martinique, Peter M, MD   60 mg at 05/15/18 0945  . MEDLINE mouth rinse  15 mL Mouth Rinse BID Milus Banister, MD   15 mL at 05/15/18 0946  . ondansetron (ZOFRAN) injection 4 mg  4 mg Intravenous Q6H PRN Martinique, Peter M, MD      . pantoprazole (PROTONIX) EC tablet 40 mg  40 mg Oral Daily Martinique, Peter M, MD   40 mg at 05/15/18 0945     Discharge Medications: Please see discharge summary for a list of discharge medications.  Relevant Imaging Results:  Relevant Lab Results:   Additional Information SS# [place] Amanda Pote, LCSW

## 2018-05-16 DIAGNOSIS — N184 Chronic kidney disease, stage 4 (severe): Secondary | ICD-10-CM

## 2018-05-16 DIAGNOSIS — I255 Ischemic cardiomyopathy: Secondary | ICD-10-CM

## 2018-05-16 DIAGNOSIS — I5043 Acute on chronic combined systolic (congestive) and diastolic (congestive) heart failure: Secondary | ICD-10-CM

## 2018-05-16 DIAGNOSIS — N289 Disorder of kidney and ureter, unspecified: Secondary | ICD-10-CM

## 2018-05-16 DIAGNOSIS — N189 Chronic kidney disease, unspecified: Secondary | ICD-10-CM

## 2018-05-16 LAB — CULTURE, BLOOD (ROUTINE X 2)
Culture: NO GROWTH
Culture: NO GROWTH
Special Requests: ADEQUATE
Special Requests: ADEQUATE

## 2018-05-16 LAB — BASIC METABOLIC PANEL
Anion gap: 15 (ref 5–15)
BUN: 71 mg/dL — ABNORMAL HIGH (ref 6–20)
CO2: 24 mmol/L (ref 22–32)
Calcium: 8.4 mg/dL — ABNORMAL LOW (ref 8.9–10.3)
Chloride: 93 mmol/L — ABNORMAL LOW (ref 98–111)
Creatinine, Ser: 3.54 mg/dL — ABNORMAL HIGH (ref 0.61–1.24)
GFR calc Af Amer: 21 mL/min — ABNORMAL LOW (ref >60)
GFR calc non Af Amer: 18 mL/min — ABNORMAL LOW (ref >60)
GLUCOSE: 190 mg/dL — AB (ref 70–99)
Potassium: 3.6 mmol/L (ref 3.5–5.1)
Sodium: 132 mmol/L — ABNORMAL LOW (ref 135–145)

## 2018-05-16 LAB — GLUCOSE, CAPILLARY
GLUCOSE-CAPILLARY: 158 mg/dL — AB (ref 70–99)
Glucose-Capillary: 154 mg/dL — ABNORMAL HIGH (ref 70–99)
Glucose-Capillary: 162 mg/dL — ABNORMAL HIGH (ref 70–99)
Glucose-Capillary: 172 mg/dL — ABNORMAL HIGH (ref 70–99)

## 2018-05-16 LAB — CBC
HCT: 28.9 % — ABNORMAL LOW (ref 39.0–52.0)
Hemoglobin: 9.2 g/dL — ABNORMAL LOW (ref 13.0–17.0)
MCH: 27.3 pg (ref 26.0–34.0)
MCHC: 31.8 g/dL (ref 30.0–36.0)
MCV: 85.8 fL (ref 80.0–100.0)
Platelets: 257 10*3/uL (ref 150–400)
RBC: 3.37 MIL/uL — ABNORMAL LOW (ref 4.22–5.81)
RDW: 13 % (ref 11.5–15.5)
WBC: 8.4 10*3/uL (ref 4.0–10.5)
nRBC: 0 % (ref 0.0–0.2)

## 2018-05-16 MED ORDER — CARVEDILOL 12.5 MG PO TABS
12.5000 mg | ORAL_TABLET | Freq: Two times a day (BID) | ORAL | Status: DC
  Administered 2018-05-16 – 2018-05-18 (×6): 12.5 mg via ORAL
  Filled 2018-05-16 (×6): qty 1

## 2018-05-16 MED ORDER — POTASSIUM CHLORIDE CRYS ER 20 MEQ PO TBCR
40.0000 meq | EXTENDED_RELEASE_TABLET | Freq: Once | ORAL | Status: AC
  Administered 2018-05-16: 40 meq via ORAL
  Filled 2018-05-16: qty 2

## 2018-05-16 MED ORDER — POLYETHYLENE GLYCOL 3350 17 G PO PACK
17.0000 g | PACK | Freq: Once | ORAL | Status: AC
  Administered 2018-05-17: 17 g via ORAL
  Filled 2018-05-16: qty 1

## 2018-05-16 NOTE — Progress Notes (Signed)
To MD on rounds: pls call pt POA  " William [name] " for updates daily on rounds per her request. Her Cellphone #  Is [phone].

## 2018-05-16 NOTE — Consult Note (Signed)
[place] Nurse wound consult note Reason for Consult:Bullous pemphigoid, per sister.  Has nonintact scabbed lesions to arms and legs and one area in neck. Since they are nonintact, we will implement nonadherent topical therapy.  Patient with noted sulfa allergy, will not begin silver containing topical treatment. Wound type:inflammatory Pressure Injury POA: NA Measurement: scattered 1.5 cm ruptured blisters Wound QUH:KISNGXE Drainage (amount, consistency, odor  Currently has silicone foam in place.  minimal serosanguinous no odor Periwound: dry skin Dressing procedure/placement/frequency: Cleanse nonintact lesions to arms, neck and legs with NS.  Apply Vaseline gauze and cover with dry dressing.  Change daily.  Will not follow at this time.  Please re-consult if needed.  Domenic Moras MSN, RN, FNP-BC CWON Wound, Ostomy, Continence Nurse Pager [phone]

## 2018-05-16 NOTE — Evaluation (Signed)
Physical Therapy Evaluation Patient Details Name: William [name] MRN: [id number] DOB: [birth year] Today's Date: 05/16/2018   History of Present Illness  Patient is a 58 y/o male who presents from SNF with emesis and hypotension. Found to have NSTEMI and hypoxic respiratory failure. Chest CT- multivessel calcification along with bilateral pleural effusions and pulmonary edema. PMH includes learning disability, CKD, DM, tobacco abuse, CVA with residual left sided weakness, falls.  Clinical Impression  Patient presents with generalized weakness (left>right), impaired cognition/learning disability, impaired balance and impaired mobility s/p above. Pt lives at [facility] and reports being w/c bound at baseline and requiring assist for ADLs. Pt is not currently at baseline and now requires mod A for bed mobility and transfers. Not able to stand fully upright due to weakness but performed squat pivot transfer to chair with Mod A. Would benefit from SNF to maximize independence and mobility so pt can return to PLOF. Will follow acutely.    Follow Up Recommendations SNF;Supervision for mobility/OOB    Equipment Recommendations  Other (comment)(defer to SNF)    Recommendations for Other Services       Precautions / Restrictions Precautions Precautions: Fall Precaution Comments: hx of CVA, learning disability  Restrictions Weight Bearing Restrictions: No      Mobility  Bed Mobility Overal bed mobility: Needs Assistance Bed Mobility: Rolling;Sidelying to Sit Rolling: Min guard Sidelying to sit: Mod assist;HOB elevated       General bed mobility comments: Step by step cues for sequencing-reach for rail; assist with LLE, bottom and to elevate trunk.   Transfers Overall transfer level: Needs assistance Equipment used: None Transfers: Sit to/from [employer] Sit to Stand: Mod assist   Squat pivot transfers: Mod assist     General transfer comment: Assist to  power to standing with cues for technique/hand placement. Not able to stand fully upright due to bil knee weakness. Squat pivot transfer to chair with Mod A.  Ambulation/Gait             General Gait Details: Does not walk at [facility]  Stairs            Wheelchair Mobility    Modified Rankin (Stroke Patients Only)       Balance Overall balance assessment: Needs assistance;History of Falls Sitting-balance support: Feet supported;No upper extremity supported Sitting balance-Leahy Scale: Fair     Standing balance support: During functional activity Standing balance-Leahy Scale: Poor Standing balance comment: external support for partial standing; not able to get fully upright                             Pertinent Vitals/Pain Pain Assessment: No/denies pain    Home Living Family/patient expects to be discharged to:: Skilled nursing facility                      Prior Function Level of Independence: Needs assistance   Gait / Transfers Assistance Needed: Reports not walking PTA, w/c bound but was doing transfers independently. Using UEs to propel w/c  ADL's / Homemaking Assistance Needed: Gets assist with ADLS from staff        Hand Dominance   Dominant Hand: Right    Extremity/Trunk Assessment   Upper Extremity Assessment Upper Extremity Assessment: Defer to OT evaluation    Lower Extremity Assessment Lower Extremity Assessment: LLE deficits/detail;Generalized weakness;RLE deficits/detail RLE Sensation: decreased light touch(tingling) LLE Deficits / Details: Residual deficits from CVA  LLE Sensation: decreased light touch(tingling)       Communication   Communication: No difficulties  Cognition Arousal/Alertness: Awake/alert Behavior During Therapy: WFL for tasks assessed/performed Overall Cognitive Status: History of cognitive impairments - at baseline Area of Impairment: Orientation;Following commands;Problem solving                  Orientation Level: Disoriented to;Time     Following Commands: Follows one step commands with increased time;Follows multi-step commands inconsistently     Problem Solving: Slow processing;Requires verbal cues General Comments: Increased time to answer questions at times; repetition needed to follow multi step commands with time. Able to state he was in the hospital but not the month despite contextual clues.       General Comments General comments (skin integrity, edema, etc.): VSS throughout    Exercises     Assessment/Plan    PT Assessment Patient needs continued PT services  PT Problem List Decreased strength;Decreased mobility;Decreased safety awareness;Decreased activity tolerance;Decreased cognition;Decreased balance;Decreased knowledge of use of DME;Impaired sensation       PT Treatment Interventions Functional mobility training;Balance training;Patient/family education;Gait training;Therapeutic activities;Therapeutic exercise;DME instruction;Neuromuscular re-education    PT Goals (Current goals can be found in the Care Plan section)  Acute Rehab PT Goals Patient Stated Goal: to get out of this bed PT Goal Formulation: With patient Time For Goal Achievement: 05/30/18 Potential to Achieve Goals: Good    Frequency Min 2X/week   Barriers to discharge        Co-evaluation               AM-PAC PT "6 Clicks" Mobility  Outcome Measure Help needed turning from your back to your side while in a flat bed without using bedrails?: A Little Help needed moving from lying on your back to sitting on the side of a flat bed without using bedrails?: A Lot Help needed moving to and from a bed to a chair (including a wheelchair)?: A Lot Help needed standing up from a chair using your arms (e.g., wheelchair or bedside chair)?: A Lot Help needed to walk in hospital room?: Total Help needed climbing 3-5 steps with a railing? : Total 6 Click Score: 11    End of  Session Equipment Utilized During Treatment: Gait belt Activity Tolerance: Patient limited by fatigue;Patient tolerated treatment well Patient left: in chair;with call bell/phone within reach;with chair alarm set Nurse Communication: Mobility status;Other (comment)(transfer technique back to bed) PT Visit Diagnosis: Muscle weakness (generalized) (M62.81);Unsteadiness on feet (R26.81);Difficulty in walking, not elsewhere classified (R26.2)    Time: 4076-8088 PT Time Calculation (min) (ACUTE ONLY): 23 min   Charges:   PT Evaluation $PT Eval Moderate Complexity: 1 Mod PT Treatments $Therapeutic Activity: 8-22 mins        Wray Kearns, PT, DPT Acute Rehabilitation Services Pager [phone] Office [phone]      William [name] 05/16/2018, 3:24 PM

## 2018-05-16 NOTE — Consult Note (Signed)
            [facility] CM Primary Care Navigator  05/16/2018  William [name] [birth year] [id number]   Went to see patient at the bedside to identify possible discharge needs but Inpatient social worker note states that patient's sister has confirmed that patient is from [facility] facility and was there long term, with anticipation to return back to[facility] term care facility [facility]) upon discharge.  NofurtherTHN care management needs identifiableatthis point.   For additional questions please contact:  Edwena Felty A. Mayford Alberg, BSN, RN-BC [facility] PRIMARY CARE Navigator Cell: [phone]

## 2018-05-16 NOTE — Progress Notes (Addendum)
Progress Note  Patient Name: William [name] Date of Encounter: 05/16/2018  Primary Cardiologist: Dr Martinique  Subjective   Patient doing fairly well this morning.  Denies chest pain or shortness of breath.  Has not yet walked, tried to get up and walk earlier today, and lost balance and sat back down in the bed.  Inpatient Medications    Scheduled Meds: . aspirin  81 mg Oral Daily  . atorvastatin  80 mg Oral q1800  . carvedilol  6.25 mg Oral BID WC  . clopidogrel  75 mg Oral Daily  . furosemide  80 mg Oral Daily  . heparin injection (subcutaneous)  5,000 Units Subcutaneous Q8H  . hydrALAZINE  50 mg Oral Q8H  . insulin aspart  0-5 Units Subcutaneous QHS  . insulin aspart  0-9 Units Subcutaneous TID WC  . isosorbide mononitrate  60 mg Oral Daily  . mouth rinse  15 mL Mouth Rinse BID  . pantoprazole  40 mg Oral Daily   Continuous Infusions:  PRN Meds: hydrALAZINE, ondansetron (ZOFRAN) IV   Vital Signs    Vitals:   05/15/18 1632 05/15/18 1945 05/16/18 0006 05/16/18 0426  BP: (!) 155/75 (!) 150/58 (!) 162/60 (!) 159/72  Pulse: 71 65 71 68  Resp: 16     Temp:  (!) 97.3 F (36.3 C) 98 F (36.7 C) 98.2 F (36.8 C)  TempSrc:  Oral Oral Oral  SpO2: 98% 96% 98% 96%  Weight:    83.2 kg  Height:        Intake/Output Summary (Last 24 hours) at 05/16/2018 0748 Last data filed at 05/16/2018 0600 Gross per 24 hour  Intake 240 ml  Output 750 ml  Net -510 ml   Filed Weights   05/14/18 0400 05/15/18 0536 05/16/18 0426  Weight: 80.6 kg 83.8 kg 83.2 kg    Physical Exam   General: Well developed, well nourished, NAD Skin: Warm, dry, intact  Head: Normocephalic, atraumatic, clear, moist mucus membranes. Neck: Negative for carotid bruits. No JVD Lungs:Clear to ausculation bilaterally. No wheezes, rales, or rhonchi. Breathing is unlabored. Cardiovascular: RRR with S1 S2. No murmurs, rubs, gallops, or LV heave appreciated. Abdomen: Soft, non-tender, non-distended with  normoactive bowel sounds. No obvious abdominal masses. MSK: Strength and tone appear normal for age. 5/5 in all extremities Extremities: No edema. No clubbing or cyanosis. DP/PT pulses 2+ bilaterally Neuro: Alert and oriented to self and situation. No focal deficits. No facial asymmetry. MAE spontaneously. Psych: Responds to questions somewhat appropriately with normal affect.    Labs    Chemistry Recent Labs  Lab 05/11/18 1820  05/14/18 0356 05/15/18 0351 05/16/18 0342  NA 138   < > 136 134* 132*  K 4.8   < > 4.0 3.3* 3.6  CL 104   < > 99 93* 93*  CO2 23   < > 25 25 24   GLUCOSE 156*   < > 141* 150* 190*  BUN 43*   < > 58* 64* 71*  CREATININE 2.82*   < > 3.11* 3.19* 3.54*  CALCIUM 8.5*   < > 8.5* 8.4* 8.4*  PROT 7.0  --   --   --   --   ALBUMIN 2.4*  --   --   --   --   AST 24  --   --   --   --   ALT 34  --   --   --   --   ALKPHOS 107  --   --   --   --  BILITOT 0.7  --   --   --   --   GFRNONAA 23*   < > 21* 20* 18*  GFRAA 27*   < > 24* 23* 21*  ANIONGAP 11   < > 12 16* 15   < > = values in this interval not displayed.     Hematology Recent Labs  Lab 05/14/18 0356 05/15/18 0351 05/16/18 0342  WBC 9.8 9.1 8.4  RBC 3.37* 3.32* 3.37*  HGB 9.6* 9.5* 9.2*  HCT 29.3* 28.4* 28.9*  MCV 86.9 85.5 85.8  MCH 28.5 28.6 27.3  MCHC 32.8 33.5 31.8  RDW 13.0 13.0 13.0  PLT 259 258 257    Cardiac Enzymes Recent Labs  Lab 05/12/18 0010 05/12/18 0323 05/12/18 0617  TROPONINI 10.31* 17.36* 18.26*    Recent Labs  Lab 05/11/18 1829 05/11/18 2137  TROPIPOC 0.62* 2.92*     BNPNo results for input(s): BNP, PROBNP in the last 168 hours.   DDimer  Recent Labs  Lab 05/11/18 1820  DDIMER 7.99*    Radiology    No results found.  Telemetry    05/16/2018 NSR HR 70- Personally Reviewed  ECG    No new tracings as of 05/16/2018- Personally Reviewed  Cardiac Studies   Echocardiogram 05/12/2018:  Study Conclusions  - Left ventricle: The cavity size was  normal. There was moderate   focal basal and mild concentric hypertrophy of the septum.   Systolic function was moderately to severely reduced. The   estimated ejection fraction was in the range of 30% to 35%.   Diffuse hypokinesis. Features are consistent with a pseudonormal   left ventricular filling pattern, with concomitant abnormal   relaxation and increased filling pressure (grade 2 diastolic   dysfunction). No evidence of thrombus, though echo contrast not   used. - Regional wall motion abnormality: Akinesis of the mid-apical   anterior, mid anteroseptal, mid anterolateral, apical lateral,   and apical myocardium. - Aortic valve: Possibly bicuspid; mildly thickened, mildly   calcified leaflets. - Mitral valve: Calcified annulus. - Left atrium: The atrium was moderately to severely dilated.  Impressions:  - Reduced EF, with diffuse hypokinesis. New akinesis in the   anterolateral, anterior, anteroseptal areas from mid wall to   apex. Concerning for LAD infarct or ischemia. Patient Profile     58 y.o. male with presumed ICM (EF 30-35%), HTN, poorly controlled DM (last a1c 9.2%), HTN, CVA resulting in LE weakness, cognitive impairment (prior to stroke independent in ADLs) who presents with hypotension and hypoxia. Also noted frequent "heart burn".  Patient ruled in for myocardial infarction.  Echocardiogram shows ejection fraction 30 to 35% and moderate to severe left atrial enlargement.  Assessment & Plan    1. NSTEMI: -Not a candidate for cardiac catheterization given multiple co-morbidities including significant renal insufficiency --> completed treatment with IV heparin -Continue ASA 81, Plavix 75, carvedilol 6.25 and high intensity atorvastatin 80 mg -Will increase carvedilol to 12.5 mg given elevated BP -Denies chest pain or other ACS symptoms   2. Acute on chronic combined systolic and diastolic CHF: -Per echocardiogram LVEF noted to be 30 to 35%, presumed ICM -Lasix  changed to 80 mg PO daily on 05/15/2018 -Weight, 183lb today, 180lb on 05/11/2018 -I&O, net -3.9 L since hospital admission -Continue hydralazine 50 mg 3 times daily, IMDUR 60 -No ARB or Entresto secondary to renal insufficiency -Does not appear to be fluid volume overloaded on exam  3. Acute on chronic CKD stage III: -Creatinine,  3.54 today, up from 3.19 on 05/15/2018 -Baseline appears to be in the 1.3-1.8 range -Consider nephrology consultation -Lasix decreased 05/15/2018  4. HTN: -Elevated, 159/72, 162/60, 150/58 -Continue hydralazine 50 mg 3 times daily, Imdur 60 mg daily -We will increase carvedilol to 12.5 mg   6.  Cognitive impairment: -Stable, basic understanding of care   Signed, Kathyrn Drown NP-C [facility] Pager: [phone] 05/16/2018, 7:48 AM     ATTENDING ATTESTATION  I have seen, examined and evaluated the patient this AM (note not siged until PM) along with Kathyrn Drown, NP-C.  After reviewing all the available data and chart, we discussed the patients laboratory, study & physical findings as well as symptoms in detail. I agree with her findings, examination as well as impression recommendations as per our discussion.    Attending adjustments noted in italics.   William [name] seems to be doing relatively well.  Relatively stable.  Agree with increase in carvedilol dose. Still diuresing on oral diuretic -stable CHF (acute on chronic combined systolic and diastolic)  Not considered to be a cardiac cath-PCI candidate, therefore medical management for non-ST elevation MI (likely in part related to renal insufficiency and cognitive impairment)  At this point disposition becomes a major issue as he seems to be somewhat debilitated and will need evaluation probably by PT/OT.  Was in a skilled nursing facility prior to hospitalization.    Glenetta Hew, M.D., M.S. Interventional Cardiologist   Pager # [phone] Phone # [phone] [address] [place], [place] [address]    For questions or updates, please contact   Please consult [email or web address] for contact info under Cardiology/STEMI.

## 2018-05-16 NOTE — Care Management Important Message (Signed)
Important Message  Patient Details  Name: William [name] MRN: [id number] Date of Birth: [birth year]   Medicare Important Message Given:  Yes    Kajah Santizo P Radin Raptis 05/16/2018, 12:16 PM

## 2018-05-16 NOTE — Progress Notes (Signed)
Inpatient Diabetes Program Recommendations  AACE/ADA: New Consensus Statement on Inpatient Glycemic Control (2015)  Target Ranges:  Prepandial:   less than 140 mg/dL      Peak postprandial:   less than 180 mg/dL (1-2 hours)      Critically ill patients:  140 - 180 mg/dL   Lab Results  Component Value Date   GLUCAP 154 (H) 05/16/2018   HGBA1C 8.3 (H) 05/12/2018    Review of Glycemic ControlResults for HUSSIEN, GREENBLATT (MRN [id number]) as of 05/16/2018 10:51  Ref. Range 05/15/2018 07:50 05/15/2018 11:30 05/15/2018 16:29 05/15/2018 21:04 05/16/2018 07:23  Glucose-Capillary Latest Ref Range: 70 - 99 mg/dL 133 (H) 193 (H) 194 (H) 172 (H) 154 (H)   Diabetes history: DM 2 Outpatient Diabetes medications:  Lantus 62 units q HS, Novolog 20 units with breakfast,Novolog 24 units with lunch, Novolog 20 units with supper, Tradjenta 5 mg daily Current orders for Inpatient glycemic control:  Novolog sensitive tid with meals and HS Inpatient Diabetes Program Recommendations:   Please consider adding Lantus 15 units daily while in the hospital. Also consider adding Novolog 3 units tid with meals.   Thanks,  Adah Perl, RN, BC-ADM Inpatient Diabetes Coordinator Pager [phone] (8a-5p)

## 2018-05-16 NOTE — Social Work (Signed)
Patient from SNF and needs PT eval for insurance authorization to return. CSW to follow and support with discharge planning.  Estanislado Emms, LCSW [phone]

## 2018-05-17 LAB — BASIC METABOLIC PANEL
Anion gap: 14 (ref 5–15)
BUN: 76 mg/dL — ABNORMAL HIGH (ref 6–20)
CO2: 23 mmol/L (ref 22–32)
Calcium: 8.4 mg/dL — ABNORMAL LOW (ref 8.9–10.3)
Chloride: 96 mmol/L — ABNORMAL LOW (ref 98–111)
Creatinine, Ser: 3.71 mg/dL — ABNORMAL HIGH (ref 0.61–1.24)
GFR calc Af Amer: 19 mL/min — ABNORMAL LOW (ref >60)
GFR, EST NON AFRICAN AMERICAN: 17 mL/min — AB (ref >60)
Glucose, Bld: 186 mg/dL — ABNORMAL HIGH (ref 70–99)
Potassium: 4.1 mmol/L (ref 3.5–5.1)
Sodium: 133 mmol/L — ABNORMAL LOW (ref 135–145)

## 2018-05-17 LAB — GLUCOSE, CAPILLARY
Glucose-Capillary: 130 mg/dL — ABNORMAL HIGH (ref 70–99)
Glucose-Capillary: 195 mg/dL — ABNORMAL HIGH (ref 70–99)
Glucose-Capillary: 210 mg/dL — ABNORMAL HIGH (ref 70–99)
Glucose-Capillary: 141 mg/dL — ABNORMAL HIGH (ref 70–99)

## 2018-05-17 MED ORDER — SODIUM CHLORIDE 0.9 % IV BOLUS
500.0000 mL | Freq: Once | INTRAVENOUS | Status: AC
  Administered 2018-05-17: 500 mL via INTRAVENOUS

## 2018-05-17 NOTE — Progress Notes (Addendum)
Progress Note  Patient Name: PREET PERRIER Date of Encounter: 05/17/2018  Primary Cardiologist:  Dr Martinique  Subjective   Pt with no complaints today. Awaiting insurance prior to DC back to SNF. Will get nephrology consult prior to discharge.   Inpatient Medications    Scheduled Meds: . aspirin  81 mg Oral Daily  . atorvastatin  80 mg Oral q1800  . carvedilol  12.5 mg Oral BID WC  . clopidogrel  75 mg Oral Daily  . furosemide  80 mg Oral Daily  . heparin injection (subcutaneous)  5,000 Units Subcutaneous Q8H  . hydrALAZINE  50 mg Oral Q8H  . insulin aspart  0-5 Units Subcutaneous QHS  . insulin aspart  0-9 Units Subcutaneous TID WC  . isosorbide mononitrate  60 mg Oral Daily  . mouth rinse  15 mL Mouth Rinse BID  . pantoprazole  40 mg Oral Daily   Continuous Infusions:  PRN Meds: hydrALAZINE, ondansetron (ZOFRAN) IV   Vital Signs    Vitals:   05/16/18 1750 05/16/18 1949 05/17/18 0435 05/17/18 0934  BP: (!) 143/75 (!) 141/65 (!) 155/73   Pulse: 61 66 66 71  Resp:  15 (!) 22   Temp:  97.8 F (36.6 C) 97.9 F (36.6 C)   TempSrc:  Oral Oral   SpO2:  96% 97%   Weight:   78.2 kg   Height:        Intake/Output Summary (Last 24 hours) at 05/17/2018 1119 Last data filed at 05/17/2018 0933 Gross per 24 hour  Intake 358 ml  Output 805 ml  Net -447 ml   Filed Weights   05/15/18 0536 05/16/18 0426 05/17/18 0435  Weight: 83.8 kg 83.2 kg 78.2 kg    Physical Exam   General: Well developed, well nourished, NAD Skin: Warm, dry, intact with multiple staging diffuse wounds over entire body Head: Normocephalic, atraumatic,  clear, moist mucus membranes. Neck: Negative for carotid bruits. No JVD Lungs:Clear to ausculation bilaterally. No wheezes, rales, or rhonchi. Breathing is unlabored. Cardiovascular: RRR with S1 S2. No murmurs, rubs, gallops, or LV heave appreciated. Abdomen: Soft, non-tender, non-distended with normoactive bowel sounds. No obvious abdominal  masses. MSK: Strength and tone appear normal for age. 5/5 in all extremities Extremities: No edema. No clubbing or cyanosis. DP/PT pulses 2+ bilaterally Neuro: Alert and oriented to person and situation. No focal deficits. No facial asymmetry. MAE spontaneously. Psych: Responds to questions appropriately with normal affect.    Labs    Chemistry Recent Labs  Lab 05/11/18 1820  05/15/18 0351 05/16/18 0342 05/17/18 0913  NA 138   < > 134* 132* 133*  K 4.8   < > 3.3* 3.6 4.1  CL 104   < > 93* 93* 96*  CO2 23   < > 25 24 23   GLUCOSE 156*   < > 150* 190* 186*  BUN 43*   < > 64* 71* 76*  CREATININE 2.82*   < > 3.19* 3.54* 3.71*  CALCIUM 8.5*   < > 8.4* 8.4* 8.4*  PROT 7.0  --   --   --   --   ALBUMIN 2.4*  --   --   --   --   AST 24  --   --   --   --   ALT 34  --   --   --   --   ALKPHOS 107  --   --   --   --  BILITOT 0.7  --   --   --   --   GFRNONAA 23*   < > 20* 18* 17*  GFRAA 27*   < > 23* 21* 19*  ANIONGAP 11   < > 16* 15 14   < > = values in this interval not displayed.     Hematology Recent Labs  Lab 05/14/18 0356 05/15/18 0351 05/16/18 0342  WBC 9.8 9.1 8.4  RBC 3.37* 3.32* 3.37*  HGB 9.6* 9.5* 9.2*  HCT 29.3* 28.4* 28.9*  MCV 86.9 85.5 85.8  MCH 28.5 28.6 27.3  MCHC 32.8 33.5 31.8  RDW 13.0 13.0 13.0  PLT 259 258 257   Cardiac Enzymes Recent Labs  Lab 05/12/18 0010 05/12/18 0323 05/12/18 0617  TROPONINI 10.31* 17.36* 18.26*    Recent Labs  Lab 05/11/18 1829 05/11/18 2137  TROPIPOC 0.62* 2.92*    BNPNo results for input(s): BNP, PROBNP in the last 168 hours.   DDimer  Recent Labs  Lab 05/11/18 1820  DDIMER 7.99*    Radiology    No results found.  Telemetry    05/17/2018 NSR - Personally Reviewed  ECG    No new tracing as of 05/17/2018- Personally Reviewed  Cardiac Studies   Echocardiogram 05/12/2018: Moderate to severely reduced EF of 30 to 35%.  This represents newly reduced EF with diffuse hypokinesis, and New akinesis in  theanterolateral, anterior, anteroseptal areas from mid wall to apex. Concerning for LAD infarct or ischemia.  Cannot exclude possible functional bicuspid aortic valve.  Patient Profile     58 y.o. male with presumed ICM (EF 30-35%), HTN, poorly controlled DM (last a1c 9.2%), HTN, CVA resulting in LE weakness, cognitive impairment (prior to stroke independent in ADLs) who presents with hypotension and hypoxia. Also noted frequent "heart burn". Patient ruled in for myocardial infarction. Echocardiogram shows newly reduced EF 30 to 35% (with anterior wall motion normality) and moderate to severe left atrial enlargement.  Assessment & Plan    1. NSTEMI: -Not a candidate for cardiac catheterization given multiple co-morbidities including significant renal insufficiency>> has completed course of IV Hep on admission; remains asymptomatic -Continue ASA 81, Plavix 75, carvedilol 6.25 and high intensity atorvastatin 80 mg -Carvedilol increased to 12.5 mg given persistently elevated BP  2. Acute on chronic combined systolic and diastolic CHF: Appears euvolemic today. -Per echocardiogram LVEF noted to be 30 to 35%, presumed ICM with anterior wall motion normality. -Weight, 172lb today, 180lb on 05/11/2018; -I&O, net -4.3 L since hospital admission --Now limited by worsening renal function -Unfortunately, Lasix was given this morning, will hold Lasix tomorrow and will resume 40mg  daily at discharge  -Continue hydralazine 50 mg 3 times daily, IMDUR 60  -No ARB or Entresto secondary to renal insufficiency  3. Acute on chronic CKD stage III: -Creatinine, 3.71 today, up from 3.54on 05/15/2018 -Baseline appears to be in the 1.3-1.8 range -Will hold Lasix tomorrow and will restart 40mg  at discharge  -Will given 519ml bolus>>likely too much diuresis  -Consider obtaining nephrology consultation prior to DC (initial recommendations of nephrology will be to continue to follow creatinine as the initial steps  would be to hold diuretic and gently hydrate.)   4. HTN: -Elevated, 144/64>155/73>141/65 --> we will not further titrate antihypertensives in order to allow for renal perfusion. -Continue hydralazine 50 mg 3 times daily, Imdur 60 mg daily -Carvedilol increased to 12.5 mg twice daily    6.  Cognitive impairment: -Stable, basic understanding of care   7. Skin  lesions/bullous pemphigoid: -Seen by WOC RN -Non-ntact>>implement nonadherent topical therapy with no addition of silver containing products secondary to sulfa allergy   -Measurement: scattered 1.5 cm ruptured blisters and scabbed  -Dressing procedure/placement/frequency: Cleanse nonintact lesions to arms, neck and legs with NS.  Apply Vaseline gauze and cover with dry dressing.  Change daily.    Signed, Kathyrn Drown NP-C [facility] Pager: [phone] 05/17/2018, 11:19 AM     ATTENDING ATTESTATION  I have seen, examined and evaluated the patient this PM following initial rounding note by Kathyrn Drown, NP-C earlier today. After reviewing all the available data and chart, we discussed the patients laboratory, study & physical findings as well as symptoms in detail. I agree with her findings, examination as well as impression recommendations as per our discussion.    Attending adjustments noted in italics.   Overall he is asymptomatic.  Major concern now is his lack of mobility and need for assistance with ambulation etc.  Will need to be discussed discharged back to skilled nursing facility.  At present, her we are waiting insurance approval but also stabilization of renal function.  Concerned with worsening renal function.  Initial plan had been to hold Lasix, unfortunately Lasix was given prior to this order being written.  We will therefore hold Lasix for the next day or 2 and gently hydrate today.  Reassess tomorrow.  If stable or improved, would probably be feel that he is okay for discharge home and have his renal function  reevaluated. Also encouraged increased p.o. fluid intake, and removing fluid restriction.   Glenetta Hew, M.D., M.S. Interventional Cardiologist   Pager # [phone] Phone # [phone] [address] [place], [place] [address]     For questions or updates, please contact   Please consult [email or web address] for contact info under Cardiology/STEMI.

## 2018-05-17 NOTE — Social Work (Signed)
Wandra Feinstein starting [facility] authorization today. Will need approval before returning to SNF. CSW to follow.  Estanislado Emms, LCSW [phone]

## 2018-05-17 NOTE — Care Management Note (Signed)
Case Management Note  Patient Details  Name: DERRIC DEALMEIDA MRN: [id number] Date of Birth: 1960/06/08  Subjective/Objective: Pt presented for Nstemi- not a candidate for cardiac cath and CHF. CSW following for SNF- awaiting insurance authorization.                   Action/Plan: CM will continue to monitor for additional transition of care needs.   Expected Discharge Date:  05/16/18               Expected Discharge Plan:  Skilled Nursing Facility  In-House Referral:  Clinical Social Work  Discharge planning Services  CM Consult  Post Acute Care Choice:  NA Choice offered to:  NA  DME Arranged:  N/A DME Agency:  NA  HH Arranged:  NA HH Agency:  NA  Status of Service:  Completed, signed off  If discussed at [employer] of Stay Meetings, dates discussed:    Additional Comments:  Bethena Roys, RN 05/17/2018, 1:54 PM

## 2018-05-18 DIAGNOSIS — M6281 Muscle weakness (generalized): Secondary | ICD-10-CM | POA: Diagnosis not present

## 2018-05-18 DIAGNOSIS — E1122 Type 2 diabetes mellitus with diabetic chronic kidney disease: Secondary | ICD-10-CM

## 2018-05-18 DIAGNOSIS — I69354 Hemiplegia and hemiparesis following cerebral infarction affecting left non-dominant side: Secondary | ICD-10-CM | POA: Diagnosis not present

## 2018-05-18 DIAGNOSIS — R7989 Other specified abnormal findings of blood chemistry: Secondary | ICD-10-CM | POA: Diagnosis not present

## 2018-05-18 DIAGNOSIS — Z833 Family history of diabetes mellitus: Secondary | ICD-10-CM | POA: Diagnosis not present

## 2018-05-18 DIAGNOSIS — I2102 ST elevation (STEMI) myocardial infarction involving left anterior descending coronary artery: Secondary | ICD-10-CM | POA: Diagnosis not present

## 2018-05-18 DIAGNOSIS — D631 Anemia in chronic kidney disease: Secondary | ICD-10-CM | POA: Diagnosis not present

## 2018-05-18 DIAGNOSIS — Z7189 Other specified counseling: Secondary | ICD-10-CM | POA: Diagnosis not present

## 2018-05-18 DIAGNOSIS — G459 Transient cerebral ischemic attack, unspecified: Secondary | ICD-10-CM | POA: Diagnosis not present

## 2018-05-18 DIAGNOSIS — E119 Type 2 diabetes mellitus without complications: Secondary | ICD-10-CM | POA: Diagnosis not present

## 2018-05-18 DIAGNOSIS — N179 Acute kidney failure, unspecified: Secondary | ICD-10-CM | POA: Diagnosis not present

## 2018-05-18 DIAGNOSIS — Z9181 History of falling: Secondary | ICD-10-CM | POA: Diagnosis not present

## 2018-05-18 DIAGNOSIS — F17210 Nicotine dependence, cigarettes, uncomplicated: Secondary | ICD-10-CM | POA: Diagnosis present

## 2018-05-18 DIAGNOSIS — R0602 Shortness of breath: Secondary | ICD-10-CM | POA: Diagnosis not present

## 2018-05-18 DIAGNOSIS — I10 Essential (primary) hypertension: Secondary | ICD-10-CM

## 2018-05-18 DIAGNOSIS — J449 Chronic obstructive pulmonary disease, unspecified: Secondary | ICD-10-CM | POA: Diagnosis not present

## 2018-05-18 DIAGNOSIS — Z66 Do not resuscitate: Secondary | ICD-10-CM | POA: Diagnosis not present

## 2018-05-18 DIAGNOSIS — I2510 Atherosclerotic heart disease of native coronary artery without angina pectoris: Secondary | ICD-10-CM | POA: Diagnosis not present

## 2018-05-18 DIAGNOSIS — J189 Pneumonia, unspecified organism: Secondary | ICD-10-CM | POA: Diagnosis not present

## 2018-05-18 DIAGNOSIS — I130 Hypertensive heart and chronic kidney disease with heart failure and stage 1 through stage 4 chronic kidney disease, or unspecified chronic kidney disease: Secondary | ICD-10-CM | POA: Diagnosis not present

## 2018-05-18 DIAGNOSIS — I5023 Acute on chronic systolic (congestive) heart failure: Secondary | ICD-10-CM | POA: Diagnosis not present

## 2018-05-18 DIAGNOSIS — R0989 Other specified symptoms and signs involving the circulatory and respiratory systems: Secondary | ICD-10-CM | POA: Diagnosis not present

## 2018-05-18 DIAGNOSIS — F819 Developmental disorder of scholastic skills, unspecified: Secondary | ICD-10-CM | POA: Diagnosis not present

## 2018-05-18 DIAGNOSIS — I5043 Acute on chronic combined systolic (congestive) and diastolic (congestive) heart failure: Secondary | ICD-10-CM | POA: Diagnosis not present

## 2018-05-18 DIAGNOSIS — R262 Difficulty in walking, not elsewhere classified: Secondary | ICD-10-CM | POA: Diagnosis not present

## 2018-05-18 DIAGNOSIS — R319 Hematuria, unspecified: Secondary | ICD-10-CM | POA: Diagnosis not present

## 2018-05-18 DIAGNOSIS — Z79899 Other long term (current) drug therapy: Secondary | ICD-10-CM | POA: Diagnosis not present

## 2018-05-18 DIAGNOSIS — Z882 Allergy status to sulfonamides status: Secondary | ICD-10-CM | POA: Diagnosis not present

## 2018-05-18 DIAGNOSIS — I2109 ST elevation (STEMI) myocardial infarction involving other coronary artery of anterior wall: Secondary | ICD-10-CM | POA: Diagnosis not present

## 2018-05-18 DIAGNOSIS — K219 Gastro-esophageal reflux disease without esophagitis: Secondary | ICD-10-CM | POA: Diagnosis not present

## 2018-05-18 DIAGNOSIS — R278 Other lack of coordination: Secondary | ICD-10-CM | POA: Diagnosis not present

## 2018-05-18 DIAGNOSIS — J9601 Acute respiratory failure with hypoxia: Secondary | ICD-10-CM | POA: Diagnosis not present

## 2018-05-18 DIAGNOSIS — N171 Acute kidney failure with acute cortical necrosis: Secondary | ICD-10-CM

## 2018-05-18 DIAGNOSIS — I214 Non-ST elevation (NSTEMI) myocardial infarction: Secondary | ICD-10-CM | POA: Diagnosis not present

## 2018-05-18 DIAGNOSIS — J811 Chronic pulmonary edema: Secondary | ICD-10-CM | POA: Diagnosis not present

## 2018-05-18 DIAGNOSIS — R531 Weakness: Secondary | ICD-10-CM | POA: Diagnosis not present

## 2018-05-18 DIAGNOSIS — M2550 Pain in unspecified joint: Secondary | ICD-10-CM | POA: Diagnosis not present

## 2018-05-18 DIAGNOSIS — F79 Unspecified intellectual disabilities: Secondary | ICD-10-CM | POA: Diagnosis present

## 2018-05-18 DIAGNOSIS — N184 Chronic kidney disease, stage 4 (severe): Secondary | ICD-10-CM | POA: Diagnosis not present

## 2018-05-18 DIAGNOSIS — I255 Ischemic cardiomyopathy: Secondary | ICD-10-CM | POA: Diagnosis not present

## 2018-05-18 DIAGNOSIS — Z7982 Long term (current) use of aspirin: Secondary | ICD-10-CM | POA: Diagnosis not present

## 2018-05-18 DIAGNOSIS — E1140 Type 2 diabetes mellitus with diabetic neuropathy, unspecified: Secondary | ICD-10-CM | POA: Diagnosis not present

## 2018-05-18 DIAGNOSIS — R269 Unspecified abnormalities of gait and mobility: Secondary | ICD-10-CM | POA: Diagnosis not present

## 2018-05-18 DIAGNOSIS — Z7401 Bed confinement status: Secondary | ICD-10-CM | POA: Diagnosis not present

## 2018-05-18 DIAGNOSIS — E782 Mixed hyperlipidemia: Secondary | ICD-10-CM | POA: Diagnosis not present

## 2018-05-18 DIAGNOSIS — L130 Dermatitis herpetiformis: Secondary | ICD-10-CM | POA: Diagnosis not present

## 2018-05-18 DIAGNOSIS — I5041 Acute combined systolic (congestive) and diastolic (congestive) heart failure: Secondary | ICD-10-CM

## 2018-05-18 DIAGNOSIS — J810 Acute pulmonary edema: Secondary | ICD-10-CM | POA: Diagnosis not present

## 2018-05-18 DIAGNOSIS — N189 Chronic kidney disease, unspecified: Secondary | ICD-10-CM | POA: Diagnosis not present

## 2018-05-18 DIAGNOSIS — R0902 Hypoxemia: Secondary | ICD-10-CM | POA: Diagnosis not present

## 2018-05-18 DIAGNOSIS — Z515 Encounter for palliative care: Secondary | ICD-10-CM | POA: Diagnosis not present

## 2018-05-18 DIAGNOSIS — N183 Chronic kidney disease, stage 3 (moderate): Secondary | ICD-10-CM

## 2018-05-18 DIAGNOSIS — E785 Hyperlipidemia, unspecified: Secondary | ICD-10-CM | POA: Diagnosis not present

## 2018-05-18 DIAGNOSIS — J9621 Acute and chronic respiratory failure with hypoxia: Secondary | ICD-10-CM | POA: Diagnosis not present

## 2018-05-18 DIAGNOSIS — I639 Cerebral infarction, unspecified: Secondary | ICD-10-CM | POA: Diagnosis not present

## 2018-05-18 LAB — BASIC METABOLIC PANEL
Anion gap: 15 (ref 5–15)
BUN: 75 mg/dL — ABNORMAL HIGH (ref 6–20)
CO2: 23 mmol/L (ref 22–32)
Calcium: 8.2 mg/dL — ABNORMAL LOW (ref 8.9–10.3)
Chloride: 94 mmol/L — ABNORMAL LOW (ref 98–111)
Creatinine, Ser: 3.54 mg/dL — ABNORMAL HIGH (ref 0.61–1.24)
GFR calc Af Amer: 21 mL/min — ABNORMAL LOW (ref >60)
GFR, EST NON AFRICAN AMERICAN: 18 mL/min — AB (ref >60)
Glucose, Bld: 134 mg/dL — ABNORMAL HIGH (ref 70–99)
Potassium: 3.6 mmol/L (ref 3.5–5.1)
Sodium: 132 mmol/L — ABNORMAL LOW (ref 135–145)

## 2018-05-18 LAB — GLUCOSE, CAPILLARY
Glucose-Capillary: 119 mg/dL — ABNORMAL HIGH (ref 70–99)
Glucose-Capillary: 172 mg/dL — ABNORMAL HIGH (ref 70–99)
Glucose-Capillary: 185 mg/dL — ABNORMAL HIGH (ref 70–99)

## 2018-05-18 MED ORDER — PANTOPRAZOLE SODIUM 40 MG PO TBEC: 40.0000 mg | DELAYED_RELEASE_TABLET | Freq: Every day | ORAL | 4 refills | Status: AC

## 2018-05-18 MED ORDER — CARVEDILOL 12.5 MG PO TABS: 12.5000 mg | ORAL_TABLET | Freq: Two times a day (BID) | ORAL | 3 refills | Status: AC

## 2018-05-18 MED ORDER — CLOPIDOGREL BISULFATE 75 MG PO TABS: 75.0000 mg | ORAL_TABLET | Freq: Every day | ORAL | 4 refills | Status: AC

## 2018-05-18 MED ORDER — ASPIRIN 81 MG PO CHEW: 81.0000 mg | CHEWABLE_TABLET | Freq: Every day | ORAL | 9 refills | Status: AC

## 2018-05-18 MED ORDER — ISOSORBIDE MONONITRATE ER 60 MG PO TB24: 60.0000 mg | ORAL_TABLET | Freq: Every day | ORAL | 4 refills | Status: AC

## 2018-05-18 MED ORDER — HYDRALAZINE HCL 50 MG PO TABS: 50.0000 mg | ORAL_TABLET | Freq: Three times a day (TID) | ORAL | 4 refills | Status: AC

## 2018-05-18 MED ORDER — ATORVASTATIN CALCIUM 80 MG PO TABS: 80.0000 mg | ORAL_TABLET | Freq: Every day | ORAL | 4 refills | Status: DC

## 2018-05-18 NOTE — Social Work (Signed)
[facility] SNF has insurance approval for patient to return today. Updated NP. Will follow for medical readiness and support with discharge planning.  Estanislado Emms, LCSW [phone]

## 2018-05-18 NOTE — Social Work (Signed)
Patient will discharge to [facility] Anticipated discharge date: 05/18/2018 Family notified: [name], sister Transportation by: [facility]  Nurse to call report to [phone]. Patient will go to room 226B at the facility.  CSW signing off.  Estanislado Emms, [employer]  Clinical Social Worker

## 2018-05-18 NOTE — Clinical Social Work Placement (Signed)
   CLINICAL SOCIAL WORK PLACEMENT  NOTE  Date:  05/18/2018  Patient Details  Name: William [name] MRN: [id number] Date of Birth: Oct 09, 1960  Clinical Social Work is seeking post-discharge placement for this patient at the [place] level of care (*CSW will initial, date and re-position this form in  chart as items are completed):  Yes   Patient/family provided with [facility] Work Department's list of facilities offering this level of care within the geographic area requested by the patient (or if unable, by the patient's family).  Yes   Patient/family informed of their freedom to choose among providers that offer the needed level of care, that participate in Medicare, Medicaid or managed care program needed by the patient, have an available bed and are willing to accept the patient.  Yes   Patient/family informed of [facility]'s ownership interest in [facility] and [facility], as well as of the fact that they are under no obligation to receive care at these facilities.  PASRR submitted to EDS on       PASRR number received on       Existing PASRR number confirmed on 05/16/18     FL2 transmitted to all facilities in geographic area requested by pt/family on 05/15/18     FL2 transmitted to all facilities within larger geographic area on       Patient informed that his/her managed care company has contracts with or will negotiate with certain facilities, including the following:  [facility])     Yes   Patient/family informed of bed offers received.  Patient chooses bed at [facility])     Physician recommends and patient chooses bed at      Patient to be transferred to [facility]) on 05/18/18.  Patient to be transferred to facility by [facility]     Patient family notified on 05/18/18 of transfer.  Name of family member notified:   [name], sister     PHYSICIAN       Additional Comment:    _______________________________________________ Estanislado Emms, LCSW 05/18/2018, 2:33 PM

## 2018-05-18 NOTE — Discharge Summary (Addendum)
Discharge Summary    Patient ID: William [name] MRN: [id number]; DOB: 07/01/1960  Admit date: 05/11/2018 Discharge date: 05/19/2018  Primary Care Provider: Marin Olp, MD  Primary Cardiologist: Dr. [place], MD   Discharge Diagnoses    Principal Problem:   NSTEMI (non-ST elevated myocardial infarction) [facility]) Active Problems:   Type II diabetes mellitus with renal manifestations ([place])   Hyperlipidemia   Essential hypertension   Stroke (cerebrum) ([place])   Type 2 diabetes mellitus with stage 3 chronic kidney disease, with long-term current use of insulin (HCC)   Stage 3 chronic renal impairment associated with type 2 diabetes mellitus (HCC)   Systolic heart failure (HCC)   Acute respiratory failure with hypoxia (HCC)   Acute pulmonary edema (HCC)   Pulmonary edema   Hypokalemia  Allergies Allergies  Allergen Reactions  . Sulfamethoxazole     REACTION: rash   Diagnostic Studies/Procedures    Echo 05/12/18: Study Conclusions  - Left ventricle: The cavity size was normal. There was moderate focal basal and mild concentric hypertrophy of the septum. Systolic function was moderately to severely reduced. The estimated ejection fraction was in the range of 30% to 35%. Diffuse hypokinesis. Features are consistent with a pseudonormal left ventricular filling pattern, with concomitant abnormal relaxation and increased filling pressure (grade 2 diastolic dysfunction). No evidence of thrombus, though echo contrast not used. - Regional wall motion abnormality: Akinesis of the mid-apical anterior, mid anteroseptal, mid anterolateral, apical lateral, and apical myocardium. - Aortic valve: Possibly bicuspid; mildly thickened, mildly calcified leaflets. - Mitral valve: Calcified annulus. - Left atrium: The atrium was moderately to severely dilated.  Impressions:  - Reduced EF, with diffuse hypokinesis. New akinesis in the anterolateral, anterior,  anteroseptal areas from mid wall to apex. Concerning for LAD infarct or ischemia.  History of Present Illness     William [name] is a 58 y.o. male former smoker with presumed ICM (EF 30-35%), HTN, poorly controlled DM (last a1c 9.2%), HTN, CVA resulting in LE weakness, cognitive impairment (prior to stroke independent in ADLs) who presents with hypotension and hypoxia, found to have ST elevations and elevated cardiac biomarkers on presentation.   Per Mr. Velis family, he was last in his baseline state of health (mostly bedbound due to LE weakness from stroke), with significant learning disability and cognitive impairment at the time of their visit one day prior to admission. When his sister arrived that afternoon, she found him in bed and lethargic appearing. EMS was activated, and found the patient to be hypotensive to the 80s/60s and hypoxic to 61% on room air. He was complaining of "heartburn" and vomited x 2.  Upon arrival to the ER, he was normotensive to 127/78 with SpO2 95% on NRB. Initial blood gas 7.35/38/56. Labs notable for lactate of 1.5, Cr of 2.8 (up from 1.6 prior), albumin 2.3. D-dimer was also elevated at 7.99. EKG demonstrated ST elevations in V2-V4 which were new from prior EKG in 08/2017 and initial troponin elevated to 0.62. CT chest w/out contrast performed demonstrated multivessel calcification of the coronary arteries as well as bilateral pleural effusions with diffuse interstitial opacities and Kerley B lines suggestive of pulmonary edema.   At the time of initial evaluation, the patient was without "heartburn". According to the patient's sister [facility]) he had been experiencing intermittent heartburns x weeks which is a new symptom for him.   With regards to his cardiomyopathy, it appears that it was initially diagnosed in 08/2017 in the setting of his  CVA. Regional wall motion abnormalities were suggestive of ischemic disease.   Given the above cardiology was asked  to admit.   Hospital Course    On presentation, patient did have some ST elevation in V3-4 new since prior tracings which also demonstrated evidence of old anterior infarct with Q waves and poor R wave progression V1-5. Troponin increased to 18. Not a candidate for emergent cardiac cath due to AKI and multiple comorbidities. Will treat with DAPT and IV heparin. Cycle enzymes and EKG. He was loaded with Plavix and stated on 75mg  daily along with ASA. His Lipitor was increased to 80mg  daily with anticipation of CAD. He was started on heparin gtt. Beta blocker was held given pulmonary edema.   Other hospital problems include:  -Acute on chronic systolic CHF:  -Probable ischemic cardiomyopathy with known history of LV dysfucntion with an EF of 30-35%.  -Volume status stabilized after initial diuretics. Lasix decreased from 120 to 80 mg twice daily.  -Unfortunately, renal function continued to climb throughout his hospital course and his Lasix was eventually held on 05/17/2018  -Carvedilol increased to 6.25 mg twice daily.   -Continue hydralazine 50mg  TID/IMDUR 60mg    -No ARB or Entresto given severity of renal insufficiency -Weight, 172lb today,180lb on 05/11/2018;-I&O,net  -4.6L since hospital admission  -Hypoxemic Respiratory Failure: -PCCM initially consulted for Bipap ventilation>>stabilized after IV diuresis    -History of CVA: -Essentially wheelchair bound at SNF  -DM -SSI for glucose control while inpatient status  -Will need to be followed by PCP at discharge   -AKI on CKD -Baseline creatinine 1.7 in May 2019 -Presenting creatinine 2.82 with a peak at 3.71 on 05/17/2018. Nephrology was verbally consulted and recommended holding diuretics and giving IV fluid hydration and follow -Renal US c/w medical renal disease with no obstruction -Creatinine improved slightly after this and was 3.54 on day of discharge -Will hold diuretic for three days then resume 40mg  PO daily  thereafter -Will obtain BMET in two days at his SNF for re-assessment of function   -HTN -Elevated,146/59>153/47>129/59 -Continue hydralazine 50 mg 3 times daily, Imdur 60 mg daily -Carvedilolincreasedto 12.5 mgtwice daily  -Bullous rash: -Seen by [place] RN during this visit  -Non-intact>>implement nonadherent topical therapywith no addition of silver containing products secondary to sulfa allergy  -Measurement:scattered 1.5 cm ruptured blistersand scabbed  -Dressing procedure/placement/frequency:Cleanse nonintact lesions to arms, neck and legs with NS.  -Apply Vaseline gauze and cover with dry dressing with daily changes    -Cognitive Impairment with learning disability: -Stable, sister is POA. Does not have capacity to make complex medical decisions for himself.  -Has basic understanding of care  -Code Status: -Patient DNR   -Elevated D-dimer on presentation:  -F/u on V/Q scan (CTA restricted due to AKI) -Was placed on IV Hep for ACS/empiric PE/DVT -LE duplex studies, negative    Consultants: PCCM, [place] RN  The patient was seen and examined by Dr. Ellyn Hack who feels that he is stable and ready for discharge today, 05/18/2018.  _____________  Discharge Vitals Blood pressure 140/71, pulse 67, temperature 98.2 F (36.8 C), temperature source Oral, resp. rate (!) 21, height 5\' 8"  (1.727 m), weight 78.2 kg, SpO2 100 %.  Filed Weights   05/15/18 0536 05/16/18 0426 05/17/18 0435  Weight: 83.8 kg 83.2 kg 78.2 kg   Labs & Radiologic Studies    CBC No results for input(s): WBC, NEUTROABS, HGB, HCT, MCV, PLT in the last 72 hours. Basic Metabolic Panel Recent Labs    05/17/18  0913 05/18/18 0709  NA 133* 132*  K 4.1 3.6  CL 96* 94*  CO2 23 23  GLUCOSE 186* 134*  BUN 76* 75*  CREATININE 3.71* 3.54*  CALCIUM 8.4* 8.2*  _____________  Ct Chest Wo Contrast  Result Date: 05/11/2018 CLINICAL DATA:  58 year old male with hypoxia. EXAM: CT CHEST WITHOUT CONTRAST  TECHNIQUE: Multidetector CT imaging of the chest was performed following the standard protocol without IV contrast. COMPARISON:  Chest radiograph dated 05/11/2018 FINDINGS: Evaluation of this exam is limited in the absence of intravenous contrast. Cardiovascular: There is mild cardiomegaly. No significant pericardial effusion. There is multi vessel coronary vascular calcification. There is hypoattenuation of the cardiac blood pool suggestive of a degree of anemia. Clinical correlation is recommended. There is moderate atherosclerotic calcification of the thoracic aorta. The central pulmonary arteries are grossly unremarkable. Mediastinum/Nodes: Top-normal paratracheal lymph nodes measure up to 10 mm in short axis. Subcarinal lymph node measures 17 mm in short axis. The esophagus is grossly unremarkable. No mediastinal fluid collection. Lungs/Pleura: There are small bilateral pleural effusions. There is diffuse interstitial and interlobular septal prominence with Kerley B-lines as well as patchy areas of central and perihilar ground-glass density. Findings most consistent with pulmonary edema with probable superimposed pneumonia. Clinical correlation is recommended. There is no pneumothorax. The central airways are patent. Upper Abdomen: There are stones within the gallbladder. There is slight irregularity of the liver contour which may represent early changes of cirrhosis. Clinical correlation is recommended. Musculoskeletal: Degenerative changes of the spine. No acute osseous pathology. IMPRESSION: 1. Findings most consistent with pulmonary edema with probable superimposed pneumonia. Clinical correlation is recommended. 2. Small bilateral pleural effusions. 3. Cholelithiasis. Electronically Signed   By: Anner Crete M.D.   On: 05/11/2018 19:59   US Renal  Result Date: 05/12/2018 CLINICAL DATA:  Renal injury. EXAM: RENAL / URINARY TRACT ULTRASOUND COMPLETE COMPARISON:  No prior. FINDINGS: Right Kidney: Renal  measurements: 10.7 x 5.7 x 5.3 cm = volume: 168.2 mL. Increased echogenicity. No mass or hydronephrosis visualized. Left Kidney: Renal measurements: 10.6 x 7.0 x 6.7 cm = volume: 258.0 mL. Increased echogenicity. No mass or hydronephrosis visualized. Bladder: Foley catheter in the bladder. Right pleural effusion incidentally noted. IMPRESSION: 1. Increased renal echogenicity noted bilaterally. These findings consistent chronic medical renal disease. No acute abnormality identified. No hydronephrosis or bladder distention. 2.  Right pleural effusion is incidentally noted. Electronically Signed   By: Marcello Moores  Register   On: 05/12/2018 11:40   Dg Chest Port 1 View  Result Date: 05/13/2018 CLINICAL DATA:  Pulmonary edema short of breath EXAM: PORTABLE CHEST 1 VIEW COMPARISON:  05/12/2018 FINDINGS: Progression of by lateral airspace disease which is perihilar in location. No effusion. Heart size upper normal. IMPRESSION: Progression of bilateral airspace disease compatible with edema. Electronically Signed   By: Franchot Gallo M.D.   On: 05/13/2018 07:48   Dg Chest Port 1 View  Result Date: 05/12/2018 CLINICAL DATA:  Shortness of breath.  Respiratory failure. EXAM: PORTABLE CHEST 1 VIEW COMPARISON:  May 11, 2018 FINDINGS: No pneumothorax. Increasing bilateral pulmonary opacities, particularly in the bases. Diffuse interstitial prominence. The heart size is borderline. No other interval changes. IMPRESSION: 1. Worsening of bilateral pulmonary opacities, suggested to represent pneumonia superimposed on a background of edema on the CT scan from May 11, 2018. Recommend clinical correlation and follow-up to resolution. Electronically Signed   By: Dorise Bullion III M.D   On: 05/12/2018 08:34   Dg Chest Port 1 [address]  Result Date: 05/11/2018 CLINICAL DATA:  Shortness of breath, weakness, and vomiting. EXAM: PORTABLE CHEST 1 VIEW COMPARISON:  05/25/2017 FINDINGS: Mild cardiomegaly. Moderate diffuse  interstitial infiltrates with Kerley B-lines, consistent with diffuse interstitial edema. No evidence of focal consolidation or significant pleural effusion. IMPRESSION: Cardiomegaly and diffuse interstitial edema, consistent with congestive heart failure. Electronically Signed   By: Earle Gell M.D.   On: 05/11/2018 18:47   Vas [place] Lower Extremity Venous (dvt)  Result Date: 05/12/2018  Lower Venous Study Indications: SOB.  Performing Technologist: Toma Copier RVS  Examination Guidelines: A complete evaluation includes B-mode imaging, spectral Doppler, color Doppler, and power Doppler as needed of all accessible portions of each vessel. Bilateral testing is considered an integral part of a complete examination. Limited examinations for reoccurring indications may be performed as noted.  Right Venous Findings: +---------+---------------+---------+-----------+----------+-------+          CompressibilityPhasicitySpontaneityPropertiesSummary +---------+---------------+---------+-----------+----------+-------+ CFV      Full           Yes      Yes                          +---------+---------------+---------+-----------+----------+-------+ SFJ      Full                                                 +---------+---------------+---------+-----------+----------+-------+ FV Prox  Full           Yes      Yes                          +---------+---------------+---------+-----------+----------+-------+ FV Mid   Full                                                 +---------+---------------+---------+-----------+----------+-------+ FV DistalFull           Yes      Yes                          +---------+---------------+---------+-----------+----------+-------+ PFV      Full           Yes      Yes                          +---------+---------------+---------+-----------+----------+-------+ POP      Full           Yes      Yes                           +---------+---------------+---------+-----------+----------+-------+ PTV      Full                                                 +---------+---------------+---------+-----------+----------+-------+ PERO     Full                                                 +---------+---------------+---------+-----------+----------+-------+  Mild technical difficulty due to involuntary jerking of the legs  Left Venous Findings: +---------+---------------+---------+-----------+----------+-------+          CompressibilityPhasicitySpontaneityPropertiesSummary +---------+---------------+---------+-----------+----------+-------+ CFV      Full           Yes      Yes                          +---------+---------------+---------+-----------+----------+-------+ SFJ      Full                                                 +---------+---------------+---------+-----------+----------+-------+ FV Prox  Full           Yes      Yes                          +---------+---------------+---------+-----------+----------+-------+ FV Mid   Full                                                 +---------+---------------+---------+-----------+----------+-------+ FV DistalFull           Yes      Yes                          +---------+---------------+---------+-----------+----------+-------+ PFV      Full           Yes      Yes                          +---------+---------------+---------+-----------+----------+-------+ POP      Full           Yes      Yes                          +---------+---------------+---------+-----------+----------+-------+ PTV      Full                                                 +---------+---------------+---------+-----------+----------+-------+ PERO     Full                                                 +---------+---------------+---------+-----------+----------+-------+ Mild technical difficulty due to involuntary jerking of the leg     Summary: Right: There is no evidence of deep vein thrombosis in the lower extremity. No cystic structure found in the popliteal fossa. Left: There is no evidence of deep vein thrombosis in the lower extremity. No cystic structure found in the popliteal fossa.  *See table(s) above for measurements and observations. Electronically signed by Monica Martinez MD on 05/12/2018 at 7:25:49 PM.    Final    Disposition   Pt is being discharged home today in good condition.  Follow-up Plans & Appointments     Contact information for follow-up providers  Kerin Ransom K, PA-C Follow up on 06/01/2018.   Specialties:  Cardiology, Radiology Why:  Your follow up appointment will be on 06/01/2018 at 1030am.  Contact information: [place] [address] [phone]            Contact information for after-discharge care    Destination    [facility] .   Service:  Skilled Nursing Contact information: [address] [place] [phone]                 Discharge Instructions    Call MD for:  difficulty breathing, headache or visual disturbances   Complete by:  As directed    Call MD for:  extreme fatigue   Complete by:  As directed    Call MD for:  hives   Complete by:  As directed    Call MD for:  persistant dizziness or light-headedness   Complete by:  As directed    Call MD for:  persistant nausea and vomiting   Complete by:  As directed    Call MD for:  redness, tenderness, or signs of infection (pain, swelling, redness, odor or green/yellow discharge around incision site)   Complete by:  As directed    Call MD for:  severe uncontrolled pain   Complete by:  As directed    Call MD for:  temperature >100.4   Complete by:  As directed    Diet - low sodium heart healthy   Complete by:  As directed    Discharge instructions   Complete by:  As directed    PLEASE DO NOT [place]!!!!! Also keep a log of you blood pressures and bring back to your follow up appt. Please call the office with any questions.   Patients taking blood thinners should generally stay away from medicines like ibuprofen, Advil, Motrin, naproxen, and Aleve due to risk of stomach bleeding. You may take Tylenol as directed or talk to your primary doctor about alternatives.  Some studies suggest Prilosec/Omeprazole interacts with Plavix. We changed your Prilosec/Omeprazole to the equivalent dose of Protonix for less chance of interaction.   Increase activity slowly   Complete by:  As directed      Discharge Medications   Allergies as of 05/18/2018      Reactions   Sulfamethoxazole    REACTION: rash      Medication List    STOP taking these medications   amLODipine 10 MG tablet Commonly known as:  NORVASC   aspirin 325 MG tablet Replaced by:  aspirin 81 MG chewable tablet   doxycycline 100 MG capsule Commonly known as:  VIBRAMYCIN   labetalol 200 MG tablet Commonly known as:  NORMODYNE   losartan-hydrochlorothiazide 100-25 MG tablet Commonly known as:  HYZAAR   omeprazole 20 MG capsule Commonly known as:  PRILOSEC     TAKE these medications   acetaminophen 325 MG tablet Commonly known as:  TYLENOL Take 650 mg by mouth every 6 (six) hours as needed for fever.   ARTIFICIAL TEARS 0.4 % Soln Generic drug:  Hypromellose Apply 2 drops to eye 2 (two) times daily.   aspirin 81 MG chewable tablet Chew 1 tablet (81 mg total) by mouth daily. Replaces:  aspirin 325 MG tablet   atorvastatin 80 MG tablet Commonly known as:  LIPITOR Take 1 tablet (80 mg total) by mouth daily at 6 PM. What changed:  medication strength  how much to take  when to take this   buPROPion 150 MG 24 hr tablet Commonly known as:  WELLBUTRIN XL Take 1 tablet (150 mg total) by mouth daily.   carvedilol 12.5 MG tablet Commonly known as:  COREG Take 1 tablet (12.5 mg total) by mouth 2 (two)  times daily with a meal.   clopidogrel 75 MG tablet Commonly known as:  PLAVIX Take 1 tablet (75 mg total) by mouth daily.   escitalopram 10 MG tablet Commonly known as:  LEXAPRO Take 10 mg by mouth daily. What changed:  Another medication with the same name was removed. Continue taking this medication, and follow the directions you see here.   ferrous sulfate 325 (65 FE) MG tablet Take 1 tablet (325 mg total) by mouth 2 (two) times daily with a meal. What changed:  when to take this   hydrALAZINE 50 MG tablet Commonly known as:  APRESOLINE Take 1 tablet (50 mg total) by mouth every 8 (eight) hours.   hydrOXYzine 25 MG tablet Commonly known as:  ATARAX/VISTARIL Take 1 tablet (25 mg total) by mouth every 6 (six) hours as needed for itching.   isosorbide mononitrate 60 MG 24 hr tablet Commonly known as:  IMDUR Take 1 tablet (60 mg total) by mouth daily.   LANTUS SOLOSTAR 100 UNIT/ML Solostar Pen Generic drug:  Insulin Glargine Inject 62 Units into the skin at bedtime.   linagliptin 5 MG Tabs tablet Commonly known as:  TRADJENTA Take 5 mg by mouth daily.   NOVOLOG PENFILL cartridge Generic drug:  insulin aspart Inject 20-24 Units into the skin 3 (three) times daily before meals. 20 units in the morning. 24 units in the afternoon. 20 units in the evening   pantoprazole 40 MG tablet Commonly known as:  PROTONIX Take 1 tablet (40 mg total) by mouth daily.   simethicone 80 MG chewable tablet Commonly known as:  MYLICON Chew 275 mg by mouth 3 (three) times daily as needed for flatulence.        Acute coronary syndrome (MI, NSTEMI, STEMI, etc) this admission?: Yes.     AHA/ACC Clinical Performance & Quality Measures: 1. Aspirin prescribed? - Yes 2. ADP Receptor Inhibitor (Plavix/Clopidogrel, Brilinta/Ticagrelor or Effient/Prasugrel) prescribed (includes medically managed patients)? - Yes 3. Beta Blocker prescribed? - Yes 4. High Intensity Statin (Lipitor 40-80mg  or  Crestor 20-40mg ) prescribed? - Yes 5. EF assessed during THIS hospitalization? - Yes 6. For EF <40%, was ACEI/ARB prescribed? - Not Applicable (EF >/= 17%) 7. For EF <40%, Aldosterone Antagonist (Spironolactone or Eplerenone) prescribed? - Not Applicable (EF >/= 00%) 8. Cardiac Rehab Phase II ordered (Included Medically managed Patients)? - Yes   Outstanding Labs/Studies   BMET in 3 days to assess renal function   Duration of Discharge Encounter   Greater than 30 minutes including physician time.  Signed, Glenetta Hew, MD 05/19/2018, 4:59 PM

## 2018-05-18 NOTE — Progress Notes (Addendum)
Progress Note  Patient Name: William [name] Date of Encounter: 05/18/2018  Primary Cardiologist: Dr Martinique  Subjective   Pt with no complaints today. Awaiting insurance prior to DC back to SNF.  Held lasix yesterday and gave fluids with mild improvement in renal function   Inpatient Medications    Scheduled Meds: . aspirin  81 mg Oral Daily  . atorvastatin  80 mg Oral q1800  . carvedilol  12.5 mg Oral BID WC  . clopidogrel  75 mg Oral Daily  . heparin injection (subcutaneous)  5,000 Units Subcutaneous Q8H  . hydrALAZINE  50 mg Oral Q8H  . insulin aspart  0-5 Units Subcutaneous QHS  . insulin aspart  0-9 Units Subcutaneous TID WC  . isosorbide mononitrate  60 mg Oral Daily  . mouth rinse  15 mL Mouth Rinse BID  . pantoprazole  40 mg Oral Daily   Continuous Infusions:  PRN Meds: hydrALAZINE, ondansetron (ZOFRAN) IV   Vital Signs    Vitals:   05/17/18 2210 05/18/18 0503 05/18/18 0810 05/18/18 0821  BP: (!) 144/64 (!) 129/59 (!) 153/47 (!) 146/59  Pulse:  67    Resp:  (!) 21    Temp:  98.2 F (36.8 C) 98.2 F (36.8 C)   TempSrc:  Oral Oral   SpO2:  100%    Weight:      Height:        Intake/Output Summary (Last 24 hours) at 05/18/2018 0941 Last data filed at 05/18/2018 0856 Gross per 24 hour  Intake 920 ml  Output 1050 ml  Net -130 ml   Filed Weights   05/15/18 0536 05/16/18 0426 05/17/18 0435  Weight: 83.8 kg 83.2 kg 78.2 kg    Physical Exam   General: Well developed, well nourished, NAD Skin: Warm, dry, intact, intact with multiple staging diffuse wounds over entire body Head: Normocephalic, atraumatic, clear, moist mucus membranes. Neck: Negative for carotid bruits. No JVD Lungs:Clear to ausculation bilaterally. No wheezes, rales, or rhonchi. Breathing is unlabored. Cardiovascular: RRR with S1 S2. No murmurs, rubs, gallops, or LV heave appreciated. Abdomen: Soft, non-tender, non-distended with normoactive bowel sounds. No obvious abdominal  masses. MSK: Strength and tone appear normal for age. 5/5 in all extremities Extremities: No edema. No clubbing or cyanosis. DP/PT pulses 2+ bilaterally Neuro: Alert and oriented to person and situation. No focal deficits. No facial asymmetry. MAE spontaneously. Psych: Responds to questions somewhat appropriately with normal affect.   Labs    Chemistry Recent Labs  Lab 05/11/18 1820  05/16/18 0342 05/17/18 0913 05/18/18 0709  NA 138   < > 132* 133* 132*  K 4.8   < > 3.6 4.1 3.6  CL 104   < > 93* 96* 94*  CO2 23   < > 24 23 23   GLUCOSE 156*   < > 190* 186* 134*  BUN 43*   < > 71* 76* 75*  CREATININE 2.82*   < > 3.54* 3.71* 3.54*  CALCIUM 8.5*   < > 8.4* 8.4* 8.2*  PROT 7.0  --   --   --   --   ALBUMIN 2.4*  --   --   --   --   AST 24  --   --   --   --   ALT 34  --   --   --   --   ALKPHOS 107  --   --   --   --   BILITOT 0.7  --   --   --   --  GFRNONAA 23*   < > 18* 17* 18*  GFRAA 27*   < > 21* 19* 21*  ANIONGAP 11   < > 15 14 15    < > = values in this interval not displayed.     Hematology Recent Labs  Lab 05/14/18 0356 05/15/18 0351 05/16/18 0342  WBC 9.8 9.1 8.4  RBC 3.37* 3.32* 3.37*  HGB 9.6* 9.5* 9.2*  HCT 29.3* 28.4* 28.9*  MCV 86.9 85.5 85.8  MCH 28.5 28.6 27.3  MCHC 32.8 33.5 31.8  RDW 13.0 13.0 13.0  PLT 259 258 257    Cardiac Enzymes Recent Labs  Lab 05/12/18 0010 05/12/18 0323 05/12/18 0617  TROPONINI 10.31* 17.36* 18.26*    Recent Labs  Lab 05/11/18 1829 05/11/18 2137  TROPIPOC 0.62* 2.92*     BNPNo results for input(s): BNP, PROBNP in the last 168 hours.   DDimer  Recent Labs  Lab 05/11/18 1820  DDIMER 7.99*     Radiology    No results found.  Telemetry    05/18/2018 NSR - Personally Reviewed  ECG    No new tracing as of 05/18/2018- Personally Reviewed  Cardiac Studies   Echocardiogram 05/12/2018:  Study Conclusions  - Left ventricle: The cavity size was normal. There was moderate   focal basal and mild  concentric hypertrophy of the septum.   Systolic function was moderately to severely reduced. The   estimated ejection fraction was in the range of 30% to 35%.   Diffuse hypokinesis. Features are consistent with a pseudonormal   left ventricular filling pattern, with concomitant abnormal   relaxation and increased filling pressure (grade 2 diastolic   dysfunction). No evidence of thrombus, though echo contrast not   used. - Regional wall motion abnormality: Akinesis of the mid-apical   anterior, mid anteroseptal, mid anterolateral, apical lateral,   and apical myocardium. - Aortic valve: Possibly bicuspid; mildly thickened, mildly   calcified leaflets. - Mitral valve: Calcified annulus. - Left atrium: The atrium was moderately to severely dilated.  Impressions:  - Reduced EF, with diffuse hypokinesis. New akinesis in the   anterolateral, anterior, anteroseptal areas from mid wall to   apex. Concerning for LAD infarct or ischemia.  Patient Profile     58 y.o. male with presumed ICM (EF 30-35%), HTN, poorly controlled DM (last a1c 9.2%), HTN, CVA resulting in LE weakness, cognitive impairment (prior to stroke independent in ADLs) who presents with hypotension and hypoxia. Also noted frequent "heart burn". Patient ruled in for myocardial infarction. Echocardiogram shows newly reduced EF 30 to 35% (with anterior wall motion normality) and moderate to severe left atrial enlargement.   Assessment & Plan    1. NSTEMI:  -Not a candidate for cardiac catheterization given multipleco-morbidities including significant renal insufficiency>>noted to have completed IV hep course on admission -Asymptomatic  -Continue ASA 81, Plavix 75, carvedilol 6.25 and high intensity atorvastatin 80 mg -Carvedilol increased to 12.5 mggiven persistently elevated BP  2.Acute on chronic combinedsystolicand diastolicCHF: -Appears euvolemic today  -Per echocardiogram LVEF noted to be 30 to 35%, presumed  ICM with normal anterior wall motion -Weight, 172lb today,180lb on 05/11/2018; -I&O,net  -4.6 L since hospital admission -Lasix held yesterday and will continue to hold for 3 days in the setting of worsening renal function with mild improvement after holding diuretics  -Continue hydralazine 50 mg 3 times daily,IMDUR 60 -No ARB or Entresto secondary to renal insufficiency  3. Acute on chronic CKD stage III: -Creatinine, 3.54 today, down from 3.71on  05/15/2018 -Baseline appears to be in the 1.3-1.8range -Will hold Lasix for three additional days>>then resume Lasix 40mg  daily thereafter   4. HTN: -Elevated, 146/59>153/47>129/59 -Continue hydralazine 50 mg 3 times daily, Imdur 60 mg daily -Carvedilol increased to 12.5 mg twice daily   6.Cognitive impairment: -Stable, basic understanding of care   7. Skin lesions/bullous pemphigoid: -Seen by WOC RN -Non-ntact>>implement nonadherent topical therapy with no addition of silver containing products secondary to sulfa allergy  -Measurement:scattered 1.5 cm ruptured blisters and scabbed  -Dressing procedure/placement/frequency:Cleanse nonintact lesions to arms, neck and legs with NS.  -Apply Vaseline gauze and cover with dry dressing with daily changes     Signed, Kathyrn Drown NP-C [facility] Pager: [phone] 05/18/2018, 9:41 AM     ATTENDING ATTESTATION  I have seen, examined and evaluated the patient this AM along with Kathyrn Drown, NP-C.  After reviewing all the available data and chart, we discussed the patients laboratory, study & physical findings as well as symptoms in detail. I agree with her findings, examination as well as impression recommendations as per our discussion.    Renal Fxn stable -improved.  Holding diuretic until recheck of BMP -end of week.   OTW - no SSX of CHF or angina. ON stable regimen.  OK for d/c to SNF.    Glenetta Hew, M.D., M.S. Interventional Cardiologist   Pager #  [phone] Phone # [phone] [address]. [place], [place] [address]     For questions or updates, please contact   Please consult [email or web address] for contact info under Cardiology/STEMI.

## 2018-05-19 DIAGNOSIS — E785 Hyperlipidemia, unspecified: Secondary | ICD-10-CM | POA: Diagnosis not present

## 2018-05-19 DIAGNOSIS — I639 Cerebral infarction, unspecified: Secondary | ICD-10-CM | POA: Diagnosis not present

## 2018-05-19 DIAGNOSIS — E119 Type 2 diabetes mellitus without complications: Secondary | ICD-10-CM | POA: Diagnosis not present

## 2018-05-19 DIAGNOSIS — I214 Non-ST elevation (NSTEMI) myocardial infarction: Secondary | ICD-10-CM | POA: Diagnosis not present

## 2018-05-20 DIAGNOSIS — I10 Essential (primary) hypertension: Secondary | ICD-10-CM | POA: Diagnosis not present

## 2018-05-20 DIAGNOSIS — I255 Ischemic cardiomyopathy: Secondary | ICD-10-CM | POA: Diagnosis not present

## 2018-05-20 DIAGNOSIS — I5023 Acute on chronic systolic (congestive) heart failure: Secondary | ICD-10-CM | POA: Diagnosis not present

## 2018-05-20 DIAGNOSIS — I639 Cerebral infarction, unspecified: Secondary | ICD-10-CM | POA: Diagnosis not present

## 2018-05-20 DIAGNOSIS — I2102 ST elevation (STEMI) myocardial infarction involving left anterior descending coronary artery: Secondary | ICD-10-CM | POA: Diagnosis not present

## 2018-05-23 DIAGNOSIS — N179 Acute kidney failure, unspecified: Secondary | ICD-10-CM | POA: Diagnosis not present

## 2018-05-23 DIAGNOSIS — I2102 ST elevation (STEMI) myocardial infarction involving left anterior descending coronary artery: Secondary | ICD-10-CM | POA: Diagnosis not present

## 2018-05-23 DIAGNOSIS — N189 Chronic kidney disease, unspecified: Secondary | ICD-10-CM | POA: Diagnosis not present

## 2018-05-23 DIAGNOSIS — I639 Cerebral infarction, unspecified: Secondary | ICD-10-CM | POA: Diagnosis not present

## 2018-05-25 DIAGNOSIS — M6281 Muscle weakness (generalized): Secondary | ICD-10-CM | POA: Diagnosis not present

## 2018-05-25 DIAGNOSIS — L130 Dermatitis herpetiformis: Secondary | ICD-10-CM | POA: Diagnosis not present

## 2018-05-26 ENCOUNTER — Inpatient Hospital Stay (HOSPITAL_COMMUNITY)
Admission: EM | Admit: 2018-05-26 | Discharge: 2018-05-31 | DRG: 291 | Disposition: A | Discharge status: Skilled Nursing Facility | Payer: Medicare Other | Attending: Family Medicine | Admitting: Family Medicine

## 2018-05-26 ENCOUNTER — Encounter (HOSPITAL_COMMUNITY): Payer: Self-pay | Admitting: Emergency Medicine

## 2018-05-26 ENCOUNTER — Other Ambulatory Visit: Payer: Self-pay

## 2018-05-26 ENCOUNTER — Emergency Department (HOSPITAL_COMMUNITY): Payer: Medicare Other

## 2018-05-26 DIAGNOSIS — R531 Weakness: Secondary | ICD-10-CM | POA: Diagnosis not present

## 2018-05-26 DIAGNOSIS — I130 Hypertensive heart and chronic kidney disease with heart failure and stage 1 through stage 4 chronic kidney disease, or unspecified chronic kidney disease: Principal | ICD-10-CM | POA: Diagnosis present

## 2018-05-26 DIAGNOSIS — Z515 Encounter for palliative care: Secondary | ICD-10-CM | POA: Diagnosis not present

## 2018-05-26 DIAGNOSIS — R0602 Shortness of breath: Secondary | ICD-10-CM | POA: Diagnosis not present

## 2018-05-26 DIAGNOSIS — Z79899 Other long term (current) drug therapy: Secondary | ICD-10-CM

## 2018-05-26 DIAGNOSIS — R319 Hematuria, unspecified: Secondary | ICD-10-CM | POA: Diagnosis present

## 2018-05-26 DIAGNOSIS — E785 Hyperlipidemia, unspecified: Secondary | ICD-10-CM | POA: Diagnosis present

## 2018-05-26 DIAGNOSIS — J449 Chronic obstructive pulmonary disease, unspecified: Secondary | ICD-10-CM | POA: Diagnosis present

## 2018-05-26 DIAGNOSIS — I5023 Acute on chronic systolic (congestive) heart failure: Secondary | ICD-10-CM | POA: Diagnosis not present

## 2018-05-26 DIAGNOSIS — I2510 Atherosclerotic heart disease of native coronary artery without angina pectoris: Secondary | ICD-10-CM | POA: Diagnosis not present

## 2018-05-26 DIAGNOSIS — I69354 Hemiplegia and hemiparesis following cerebral infarction affecting left non-dominant side: Secondary | ICD-10-CM | POA: Diagnosis not present

## 2018-05-26 DIAGNOSIS — Z882 Allergy status to sulfonamides status: Secondary | ICD-10-CM

## 2018-05-26 DIAGNOSIS — R269 Unspecified abnormalities of gait and mobility: Secondary | ICD-10-CM | POA: Diagnosis not present

## 2018-05-26 DIAGNOSIS — Z794 Long term (current) use of insulin: Secondary | ICD-10-CM

## 2018-05-26 DIAGNOSIS — F819 Developmental disorder of scholastic skills, unspecified: Secondary | ICD-10-CM | POA: Diagnosis present

## 2018-05-26 DIAGNOSIS — N179 Acute kidney failure, unspecified: Secondary | ICD-10-CM

## 2018-05-26 DIAGNOSIS — F79 Unspecified intellectual disabilities: Secondary | ICD-10-CM | POA: Diagnosis present

## 2018-05-26 DIAGNOSIS — K219 Gastro-esophageal reflux disease without esophagitis: Secondary | ICD-10-CM | POA: Diagnosis present

## 2018-05-26 DIAGNOSIS — R7989 Other specified abnormal findings of blood chemistry: Secondary | ICD-10-CM

## 2018-05-26 DIAGNOSIS — Z7982 Long term (current) use of aspirin: Secondary | ICD-10-CM | POA: Diagnosis not present

## 2018-05-26 DIAGNOSIS — Z993 Dependence on wheelchair: Secondary | ICD-10-CM

## 2018-05-26 DIAGNOSIS — I5043 Acute on chronic combined systolic (congestive) and diastolic (congestive) heart failure: Secondary | ICD-10-CM | POA: Diagnosis not present

## 2018-05-26 DIAGNOSIS — E1159 Type 2 diabetes mellitus with other circulatory complications: Secondary | ICD-10-CM

## 2018-05-26 DIAGNOSIS — N183 Chronic kidney disease, stage 3 unspecified: Secondary | ICD-10-CM

## 2018-05-26 DIAGNOSIS — J811 Chronic pulmonary edema: Secondary | ICD-10-CM | POA: Diagnosis present

## 2018-05-26 DIAGNOSIS — J9601 Acute respiratory failure with hypoxia: Secondary | ICD-10-CM | POA: Diagnosis not present

## 2018-05-26 DIAGNOSIS — D631 Anemia in chronic kidney disease: Secondary | ICD-10-CM | POA: Diagnosis not present

## 2018-05-26 DIAGNOSIS — Z7902 Long term (current) use of antithrombotics/antiplatelets: Secondary | ICD-10-CM

## 2018-05-26 DIAGNOSIS — F17210 Nicotine dependence, cigarettes, uncomplicated: Secondary | ICD-10-CM | POA: Diagnosis present

## 2018-05-26 DIAGNOSIS — E1140 Type 2 diabetes mellitus with diabetic neuropathy, unspecified: Secondary | ICD-10-CM | POA: Diagnosis present

## 2018-05-26 DIAGNOSIS — Z833 Family history of diabetes mellitus: Secondary | ICD-10-CM | POA: Diagnosis not present

## 2018-05-26 DIAGNOSIS — N184 Chronic kidney disease, stage 4 (severe): Secondary | ICD-10-CM | POA: Diagnosis present

## 2018-05-26 DIAGNOSIS — Z8249 Family history of ischemic heart disease and other diseases of the circulatory system: Secondary | ICD-10-CM

## 2018-05-26 DIAGNOSIS — R279 Unspecified lack of coordination: Secondary | ICD-10-CM | POA: Diagnosis not present

## 2018-05-26 DIAGNOSIS — E1122 Type 2 diabetes mellitus with diabetic chronic kidney disease: Secondary | ICD-10-CM | POA: Diagnosis not present

## 2018-05-26 DIAGNOSIS — I10 Essential (primary) hypertension: Secondary | ICD-10-CM

## 2018-05-26 DIAGNOSIS — I152 Hypertension secondary to endocrine disorders: Secondary | ICD-10-CM | POA: Diagnosis present

## 2018-05-26 DIAGNOSIS — Z743 Need for continuous supervision: Secondary | ICD-10-CM | POA: Diagnosis not present

## 2018-05-26 DIAGNOSIS — Z7189 Other specified counseling: Secondary | ICD-10-CM

## 2018-05-26 DIAGNOSIS — Z66 Do not resuscitate: Secondary | ICD-10-CM | POA: Diagnosis not present

## 2018-05-26 DIAGNOSIS — F17200 Nicotine dependence, unspecified, uncomplicated: Secondary | ICD-10-CM | POA: Diagnosis present

## 2018-05-26 DIAGNOSIS — Z823 Family history of stroke: Secondary | ICD-10-CM

## 2018-05-26 DIAGNOSIS — I129 Hypertensive chronic kidney disease with stage 1 through stage 4 chronic kidney disease, or unspecified chronic kidney disease: Secondary | ICD-10-CM | POA: Diagnosis not present

## 2018-05-26 DIAGNOSIS — I252 Old myocardial infarction: Secondary | ICD-10-CM

## 2018-05-26 DIAGNOSIS — R778 Other specified abnormalities of plasma proteins: Secondary | ICD-10-CM

## 2018-05-26 DIAGNOSIS — R0902 Hypoxemia: Secondary | ICD-10-CM | POA: Diagnosis not present

## 2018-05-26 DIAGNOSIS — J9690 Respiratory failure, unspecified, unspecified whether with hypoxia or hypercapnia: Secondary | ICD-10-CM | POA: Diagnosis not present

## 2018-05-26 DIAGNOSIS — E1129 Type 2 diabetes mellitus with other diabetic kidney complication: Secondary | ICD-10-CM | POA: Diagnosis present

## 2018-05-26 LAB — CBC WITH DIFFERENTIAL/PLATELET
Abs Immature Granulocytes: 0.11 10*3/uL — ABNORMAL HIGH (ref 0.00–0.07)
Basophils Absolute: 0.1 10*3/uL (ref 0.0–0.1)
Basophils Relative: 0 %
Eosinophils Absolute: 0.2 10*3/uL (ref 0.0–0.5)
Eosinophils Relative: 1 %
HCT: 27.4 % — ABNORMAL LOW (ref 39.0–52.0)
Hemoglobin: 8.6 g/dL — ABNORMAL LOW (ref 13.0–17.0)
IMMATURE GRANULOCYTES: 1 %
Lymphocytes Relative: 8 %
Lymphs Abs: 1.1 10*3/uL (ref 0.7–4.0)
MCH: 27.6 pg (ref 26.0–34.0)
MCHC: 31.4 g/dL (ref 30.0–36.0)
MCV: 87.8 fL (ref 80.0–100.0)
Monocytes Absolute: 1.1 10*3/uL — ABNORMAL HIGH (ref 0.1–1.0)
Monocytes Relative: 8 %
NEUTROS PCT: 82 %
Neutro Abs: 10.5 10*3/uL — ABNORMAL HIGH (ref 1.7–7.7)
Platelets: 328 10*3/uL (ref 150–400)
RBC: 3.12 MIL/uL — ABNORMAL LOW (ref 4.22–5.81)
RDW: 13.6 % (ref 11.5–15.5)
WBC: 13 10*3/uL — ABNORMAL HIGH (ref 4.0–10.5)
nRBC: 0 % (ref 0.0–0.2)

## 2018-05-26 LAB — COMPREHENSIVE METABOLIC PANEL
ALT: 22 U/L (ref 0–44)
AST: 21 U/L (ref 15–41)
Albumin: 2 g/dL — ABNORMAL LOW (ref 3.5–5.0)
Alkaline Phosphatase: 72 U/L (ref 38–126)
Anion gap: 14 (ref 5–15)
BUN: 74 mg/dL — ABNORMAL HIGH (ref 6–20)
CALCIUM: 7.9 mg/dL — AB (ref 8.9–10.3)
CO2: 22 mmol/L (ref 22–32)
Chloride: 98 mmol/L (ref 98–111)
Creatinine, Ser: 4.77 mg/dL — ABNORMAL HIGH (ref 0.61–1.24)
GFR calc Af Amer: 14 mL/min — ABNORMAL LOW (ref >60)
GFR calc non Af Amer: 12 mL/min — ABNORMAL LOW (ref >60)
Glucose, Bld: 177 mg/dL — ABNORMAL HIGH (ref 70–99)
Potassium: 3.6 mmol/L (ref 3.5–5.1)
Sodium: 134 mmol/L — ABNORMAL LOW (ref 135–145)
Total Bilirubin: 0.7 mg/dL (ref 0.3–1.2)
Total Protein: 7.1 g/dL (ref 6.5–8.1)

## 2018-05-26 LAB — I-STAT TROPONIN, ED: Troponin i, poc: 0.28 ng/mL (ref 0.00–0.08)

## 2018-05-26 LAB — BRAIN NATRIURETIC PEPTIDE: B Natriuretic Peptide: 490.8 pg/mL — ABNORMAL HIGH (ref 0.0–100.0)

## 2018-05-26 LAB — D-DIMER, QUANTITATIVE: D-Dimer, Quant: 6.63 ug/mL-FEU — ABNORMAL HIGH (ref 0.00–0.50)

## 2018-05-26 LAB — GLUCOSE, CAPILLARY: Glucose-Capillary: 128 mg/dL — ABNORMAL HIGH (ref 70–99)

## 2018-05-26 MED ORDER — DOCUSATE SODIUM 283 MG RE ENEM
1.0000 | ENEMA | RECTAL | Status: DC | PRN
  Filled 2018-05-26: qty 1

## 2018-05-26 MED ORDER — SIMETHICONE 80 MG PO CHEW: 160.0000 mg | CHEWABLE_TABLET | Freq: Three times a day (TID) | ORAL | Status: DC | PRN

## 2018-05-26 MED ORDER — POLYVINYL ALCOHOL 1.4 % OP SOLN
2.0000 [drp] | Freq: Two times a day (BID) | OPHTHALMIC | Status: DC
  Administered 2018-05-27 – 2018-05-30 (×4): 2 [drp] via OPHTHALMIC
  Filled 2018-05-26: qty 15

## 2018-05-26 MED ORDER — CALCIUM CARBONATE ANTACID 1250 MG/5ML PO SUSP
500.0000 mg | Freq: Four times a day (QID) | ORAL | Status: DC | PRN
  Filled 2018-05-26: qty 5

## 2018-05-26 MED ORDER — INSULIN ASPART 100 UNIT/ML ~~LOC~~ SOLN: 0.0000 [IU] | Freq: Three times a day (TID) | SUBCUTANEOUS | Status: DC

## 2018-05-26 MED ORDER — ACETAMINOPHEN 325 MG PO TABS
650.0000 mg | ORAL_TABLET | Freq: Four times a day (QID) | ORAL | Status: DC | PRN
  Administered 2018-05-27: 650 mg via ORAL
  Filled 2018-05-26 (×2): qty 2

## 2018-05-26 MED ORDER — HEPARIN BOLUS VIA INFUSION
3900.0000 [IU] | Freq: Once | INTRAVENOUS | Status: AC
  Administered 2018-05-26: 3900 [IU] via INTRAVENOUS
  Filled 2018-05-26: qty 3900

## 2018-05-26 MED ORDER — LINAGLIPTIN 5 MG PO TABS
5.0000 mg | ORAL_TABLET | Freq: Every day | ORAL | Status: DC
  Administered 2018-05-27 – 2018-05-28 (×2): 5 mg via ORAL
  Filled 2018-05-26 (×2): qty 1

## 2018-05-26 MED ORDER — ASPIRIN 81 MG PO CHEW
81.0000 mg | CHEWABLE_TABLET | Freq: Every day | ORAL | Status: DC
  Administered 2018-05-27 – 2018-05-31 (×5): 81 mg via ORAL
  Filled 2018-05-26 (×5): qty 1

## 2018-05-26 MED ORDER — HYDROXYZINE HCL 25 MG PO TABS: 25.0000 mg | ORAL_TABLET | Freq: Three times a day (TID) | ORAL | Status: DC | PRN

## 2018-05-26 MED ORDER — ISOSORBIDE MONONITRATE ER 60 MG PO TB24
60.0000 mg | ORAL_TABLET | Freq: Every day | ORAL | Status: DC
  Administered 2018-05-27 – 2018-05-31 (×5): 60 mg via ORAL
  Filled 2018-05-26 (×5): qty 1

## 2018-05-26 MED ORDER — ACETAMINOPHEN 325 MG PO TABS
650.0000 mg | ORAL_TABLET | Freq: Four times a day (QID) | ORAL | Status: DC | PRN
  Administered 2018-05-28: 650 mg via ORAL

## 2018-05-26 MED ORDER — HYPROMELLOSE 0.4 % OP SOLN: 2.0000 [drp] | Freq: Two times a day (BID) | OPHTHALMIC | Status: DC

## 2018-05-26 MED ORDER — ESCITALOPRAM OXALATE 10 MG PO TABS
10.0000 mg | ORAL_TABLET | Freq: Every day | ORAL | Status: DC
  Administered 2018-05-27 – 2018-05-31 (×5): 10 mg via ORAL
  Filled 2018-05-26 (×5): qty 1

## 2018-05-26 MED ORDER — SODIUM CHLORIDE 0.9% FLUSH
3.0000 mL | Freq: Two times a day (BID) | INTRAVENOUS | Status: DC
  Administered 2018-05-27 – 2018-05-31 (×8): 3 mL via INTRAVENOUS

## 2018-05-26 MED ORDER — ONDANSETRON HCL 4 MG PO TABS: 4.0000 mg | ORAL_TABLET | Freq: Four times a day (QID) | ORAL | Status: DC | PRN

## 2018-05-26 MED ORDER — CAMPHOR-MENTHOL 0.5-0.5 % EX LOTN
1.0000 "application " | TOPICAL_LOTION | Freq: Three times a day (TID) | CUTANEOUS | Status: DC | PRN
  Filled 2018-05-26: qty 222

## 2018-05-26 MED ORDER — HEPARIN (PORCINE) 25000 UT/250ML-% IV SOLN
1250.0000 [IU]/h | INTRAVENOUS | Status: DC
  Administered 2018-05-26: 1250 [IU]/h via INTRAVENOUS
  Filled 2018-05-26: qty 250

## 2018-05-26 MED ORDER — PANTOPRAZOLE SODIUM 40 MG PO TBEC
40.0000 mg | DELAYED_RELEASE_TABLET | Freq: Every day | ORAL | Status: DC
  Administered 2018-05-27 – 2018-05-31 (×5): 40 mg via ORAL
  Filled 2018-05-26 (×5): qty 1

## 2018-05-26 MED ORDER — BUPROPION HCL ER (SR) 150 MG PO TB12
150.0000 mg | ORAL_TABLET | Freq: Two times a day (BID) | ORAL | Status: DC
  Administered 2018-05-26 – 2018-05-31 (×10): 150 mg via ORAL
  Filled 2018-05-26 (×10): qty 1

## 2018-05-26 MED ORDER — SORBITOL 70 % SOLN: 30.0000 mL | Status: DC | PRN

## 2018-05-26 MED ORDER — ATORVASTATIN CALCIUM 80 MG PO TABS
80.0000 mg | ORAL_TABLET | Freq: Every day | ORAL | Status: DC
  Administered 2018-05-28 – 2018-05-30 (×3): 80 mg via ORAL
  Filled 2018-05-26 (×4): qty 1

## 2018-05-26 MED ORDER — INSULIN ASPART 100 UNIT/ML ~~LOC~~ SOLN: 0.0000 [IU] | Freq: Every day | SUBCUTANEOUS | Status: DC

## 2018-05-26 MED ORDER — GLUCERNA SHAKE PO LIQD
237.0000 mL | Freq: Two times a day (BID) | ORAL | Status: DC
  Administered 2018-05-28 – 2018-05-30 (×2): 237 mL via ORAL
  Filled 2018-05-26 (×9): qty 237

## 2018-05-26 MED ORDER — FERROUS SULFATE 325 (65 FE) MG PO TABS
325.0000 mg | ORAL_TABLET | Freq: Every day | ORAL | Status: DC
  Administered 2018-05-27 – 2018-05-31 (×5): 325 mg via ORAL
  Filled 2018-05-26 (×5): qty 1

## 2018-05-26 MED ORDER — ACETAMINOPHEN 650 MG RE SUPP: 650.0000 mg | Freq: Four times a day (QID) | RECTAL | Status: DC | PRN

## 2018-05-26 MED ORDER — NICOTINE 21 MG/24HR TD PT24
21.0000 mg | MEDICATED_PATCH | Freq: Every day | TRANSDERMAL | Status: DC
  Administered 2018-05-27 – 2018-05-31 (×5): 21 mg via TRANSDERMAL
  Filled 2018-05-26 (×5): qty 1

## 2018-05-26 MED ORDER — HYDROXYZINE HCL 25 MG PO TABS: 25.0000 mg | ORAL_TABLET | Freq: Four times a day (QID) | ORAL | Status: DC | PRN

## 2018-05-26 MED ORDER — HYDRALAZINE HCL 50 MG PO TABS
50.0000 mg | ORAL_TABLET | Freq: Three times a day (TID) | ORAL | Status: DC
  Administered 2018-05-26 – 2018-05-30 (×12): 50 mg via ORAL
  Filled 2018-05-26 (×13): qty 1

## 2018-05-26 MED ORDER — SODIUM CHLORIDE 0.9 % IV SOLN: 250.0000 mL | INTRAVENOUS | Status: DC | PRN

## 2018-05-26 MED ORDER — INSULIN GLARGINE 100 UNIT/ML ~~LOC~~ SOLN
62.0000 [IU] | Freq: Every day | SUBCUTANEOUS | Status: DC
  Administered 2018-05-26 – 2018-05-27 (×2): 62 [IU] via SUBCUTANEOUS
  Filled 2018-05-26 (×3): qty 0.62

## 2018-05-26 MED ORDER — CLOPIDOGREL BISULFATE 75 MG PO TABS
75.0000 mg | ORAL_TABLET | Freq: Every day | ORAL | Status: DC
  Administered 2018-05-27 – 2018-05-31 (×5): 75 mg via ORAL
  Filled 2018-05-26 (×5): qty 1

## 2018-05-26 MED ORDER — SODIUM CHLORIDE 0.9% FLUSH: 3.0000 mL | INTRAVENOUS | Status: DC | PRN

## 2018-05-26 MED ORDER — FUROSEMIDE 10 MG/ML IJ SOLN
40.0000 mg | Freq: Two times a day (BID) | INTRAMUSCULAR | Status: DC
  Administered 2018-05-27 – 2018-05-31 (×9): 40 mg via INTRAVENOUS
  Filled 2018-05-26 (×9): qty 4

## 2018-05-26 MED ORDER — ZOLPIDEM TARTRATE 5 MG PO TABS: 5.0000 mg | ORAL_TABLET | Freq: Every evening | ORAL | Status: DC | PRN

## 2018-05-26 MED ORDER — ONDANSETRON HCL 4 MG/2ML IJ SOLN
4.0000 mg | Freq: Four times a day (QID) | INTRAMUSCULAR | Status: DC | PRN
  Administered 2018-05-29 – 2018-05-31 (×2): 4 mg via INTRAVENOUS
  Filled 2018-05-26 (×2): qty 2

## 2018-05-26 MED ORDER — NEPRO/CARBSTEADY PO LIQD: 237.0000 mL | Freq: Three times a day (TID) | ORAL | Status: DC | PRN

## 2018-05-26 MED ORDER — CARVEDILOL 12.5 MG PO TABS
12.5000 mg | ORAL_TABLET | Freq: Two times a day (BID) | ORAL | Status: DC
  Administered 2018-05-27 – 2018-05-31 (×8): 12.5 mg via ORAL
  Filled 2018-05-26 (×9): qty 1

## 2018-05-26 NOTE — Consult Note (Signed)
Cardiology Consultation Note    Patient ID: William [name], MRN: [id number], DOB/AGE: 1960-04-21 57 y.o. Admit date: 05/26/2018   Date of Consult: 05/26/2018 Primary Physician: Marin Olp, MD Primary Cardiologist: Dr. Martinique  Chief Complaint: SOB Reason for Consultation: history of CHF, recent NSTEMI admission Requesting MD: Dr. Jonelle Sidle  HPI: William [name] is a 58 y.o. male with a history of NSTEMI (recently admitted from 7/59/16-3/84/66), chronic systolic heart failure (EF 30 to 35%), diabetes type 2, hypertension presents to the ED emergency department with 1 week of shortness of breath.  The patient states that he has felt like he cannot catch his breath for the past week.  He states that there is no associated chest pain, there is no positional component (denies orthopnea, PND) and is not associated with lower extremity edema.  Patient states that he typically gets around using a wheelchair.  When pressed, he states that he can walk, but his sister is concern for falls so makes him use a wheelchair.  Of note, patient has cognitive impairment with list learning disability and resides in a nursing home.  The nursing home reports that he has had decreased oral intake for the past 2 weeks and has recently developed a new oxygen requirement.  In the ER, there was concern for acute on chronic systolic heart failure versus pulmonary embolus as a cause of his dyspnea.  Cardiology was consulted for evaluation of his congestive heart failure  Past Medical History:  Diagnosis Date  . CEREBROVASCULAR ACCIDENT, HX OF 09/28/2006   2007 affected left side and peripheral vision  . DIABETES MELLITUS, TYPE II 09/28/2006  . GERD (gastroesophageal reflux disease)   . HYPERLIPIDEMIA 09/28/2006  . HYPERTENSION 09/28/2006  . Learning disability   . Stroke [facility]) years ago      Surgical History:  Past Surgical History:  Procedure Laterality Date  . COLONOSCOPY    . LYMPHADENECTOMY    . RHINOPLASTY     . SIGMOIDOSCOPY       Home Meds: Prior to Admission medications   Medication Sig Start Date End Date Taking? Authorizing Provider  acetaminophen (TYLENOL) 325 MG tablet Take 650 mg by mouth every 6 (six) hours as needed for fever.   Yes [provider]  aspirin 81 MG chewable tablet Chew 1 tablet (81 mg total) by mouth daily. 05/19/18  Yes Kathyrn Drown D, NP  atorvastatin (LIPITOR) 80 MG tablet Take 1 tablet (80 mg total) by mouth daily at 6 PM. 05/18/18  Yes Kathyrn Drown D, NP  buPROPion [facility] SR) 150 MG 12 hr tablet Take 150 mg by mouth 2 (two) times daily.   Yes [provider]  carvedilol (COREG) 12.5 MG tablet Take 1 tablet (12.5 mg total) by mouth 2 (two) times daily with a meal. 05/18/18  Yes Kathyrn Drown D, NP  clopidogrel (PLAVIX) 75 MG tablet Take 1 tablet (75 mg total) by mouth daily. 05/19/18  Yes Kathyrn Drown D, NP  escitalopram (LEXAPRO) 10 MG tablet Take 10 mg by mouth daily.   Yes [provider]  ferrous sulfate 325 (65 FE) MG tablet Take 1 tablet (325 mg total) by mouth 2 (two) times daily with a meal. Patient taking differently: Take 325 mg by mouth daily with breakfast.  03/24/17  Yes Marin Olp, MD  GLUCERNA [facility]) LIQD Take 237 mLs by mouth 2 (two) times daily.   Yes [provider]  hydrALAZINE (APRESOLINE) 50 MG tablet Take 1 tablet (50  mg total) by mouth every 8 (eight) hours. 05/18/18  Yes Kathyrn Drown D, NP  hydrOXYzine (ATARAX/VISTARIL) 25 MG tablet Take 1 tablet (25 mg total) by mouth every 6 (six) hours as needed for itching. 05/07/18  Yes Street, [place], PA-C  Hypromellose (ARTIFICIAL TEARS) 0.4 % SOLN Place 2 drops into both eyes 2 (two) times daily.    Yes [provider]  insulin aspart (NOVOLOG PENFILL) cartridge Inject 20-24 Units into the skin 3 (three) times daily before meals. 20 units in the morning. 24 units in the afternoon. 20 units in the evening 11/24/17  Yes [provider]    Insulin Glargine (LANTUS SOLOSTAR) 100 UNIT/ML Solostar Pen Inject 62 Units into the skin at bedtime.  11/17/17  Yes [provider]  isosorbide mononitrate (IMDUR) 60 MG 24 hr tablet Take 1 tablet (60 mg total) by mouth daily. 05/19/18  Yes Kathyrn Drown D, NP  linagliptin (TRADJENTA) 5 MG TABS tablet Take 5 mg by mouth daily. 09/27/17  Yes [provider]  pantoprazole (PROTONIX) 40 MG tablet Take 1 tablet (40 mg total) by mouth daily. 05/19/18  Yes Kathyrn Drown D, NP  simethicone (MYLICON) 80 MG chewable tablet Chew 160 mg by mouth 3 (three) times daily as needed for flatulence.   Yes [provider]    Inpatient Medications:  . [START ON 05/27/2018] aspirin  81 mg Oral Daily  . [START ON 05/27/2018] atorvastatin  80 mg Oral q1800  . buPROPion  150 mg Oral BID  . [START ON 05/27/2018] carvedilol  12.5 mg Oral BID WC  . [START ON 05/27/2018] clopidogrel  75 mg Oral Daily  . [START ON 05/27/2018] escitalopram  10 mg Oral Daily  . [START ON 05/27/2018] feeding supplement (GLUCERNA SHAKE)  237 mL Oral BID  . [START ON 05/27/2018] ferrous sulfate  325 mg Oral Q breakfast  . [START ON 05/27/2018] furosemide  40 mg Intravenous BID  . hydrALAZINE  50 mg Oral Q8H  . insulin aspart  0-5 Units Subcutaneous QHS  . [START ON 05/27/2018] insulin aspart  0-9 Units Subcutaneous TID WC  . insulin glargine  62 Units Subcutaneous QHS  . [START ON 05/27/2018] isosorbide mononitrate  60 mg Oral Daily  . [START ON 05/27/2018] linagliptin  5 mg Oral Daily  . [START ON 05/27/2018] nicotine  21 mg Transdermal Daily  . [START ON 05/27/2018] pantoprazole  40 mg Oral Daily  . [START ON 05/27/2018] polyvinyl alcohol  2 drop Both Eyes BID  . sodium chloride flush  3 mL Intravenous Q12H   . sodium chloride    . heparin 1,250 Units/hr (05/26/18 2131)    Allergies:  Allergies  Allergen Reactions  . Sulfamethoxazole Rash    Social History   Socioeconomic History  . Marital status: Divorced    Spouse name:  Not on file  . Number of children: Not on file  . Years of education: Not on file  . Highest education level: Not on file  Occupational History  . Not on file  Social Needs  . Financial resource strain: Not hard at all  . Food insecurity:    Worry: Never true    Inability: Never true  . Transportation needs:    Medical: No    Non-medical: No  Tobacco Use  . Smoking status: Current Every Day Smoker    Packs/day: 1.00    Types: Cigarettes  . Smokeless tobacco: Former Network engineer and Sexual Activity  . Alcohol use: No  Alcohol/week: 0.0 standard drinks  . Drug use: No  . Sexual activity: Not on file  Lifestyle  . Physical activity:    Days per week: 0 days    Minutes per session: 0 min  . Stress: Not at all  Relationships  . Social connections:    Talks on phone: Three times a week    Gets together: Three times a week    Attends religious service: Never    Active member of club or organization: No    Attends meetings of clubs or organizations: Never    Relationship status: Divorced  . Intimate partner violence:    Fear of current or ex partner: No    Emotionally abused: No    Physically abused: No    Forced sexual activity: No  Other Topics Concern  . Not on file  Social History Narrative   Single. Lives alone will be living in a trailer near family friends Otho Perl and husband Renee Rival- will be staying in their trailer). They watch out for him.    Sister is also involved heavily.       Disabled after CVA, low literacy at baseline      Enjoys hunting     Family History  Problem Relation Age of Onset  . Lung cancer Mother   . Diabetes Father   . Stroke Father   . Alcohol abuse Brother   . Diabetes Brother   . Heart attack Sister   . Colon cancer Neg Hx      Review of Systems: General: negative for chills, fever, night sweats or weight changes.  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 Respiratory: negative for cough or wheezing, positive for SOB per HPI  Urologic: negative for hematuria Abdominal: negative for nausea, vomiting, diarrhea, bright red blood per rectum, melena, or hematemesis Neurologic: negative for visual changes, syncope, or dizziness All other systems reviewed and are otherwise negative except as noted above.  Labs: No results for input(s): CKTOTAL, CKMB, TROPONINI in the last 72 hours. Lab Results  Component Value Date   WBC 13.0 (H) 05/26/2018   HGB 8.6 (L) 05/26/2018   HCT 27.4 (L) 05/26/2018   MCV 87.8 05/26/2018   PLT 328 05/26/2018    Recent Labs  Lab 05/26/18 1947  NA 134*  K 3.6  CL 98  CO2 22  BUN 74*  CREATININE 4.77*  CALCIUM 7.9*  PROT 7.1  BILITOT 0.7  ALKPHOS 72  ALT 22  AST 21  GLUCOSE 177*   Lab Results  Component Value Date   CHOL 90 05/12/2018   HDL 22 (L) 05/12/2018   LDLCALC 49 05/12/2018   TRIG 95 05/12/2018   Lab Results  Component Value Date   DDIMER 6.63 (H) 05/26/2018    Radiology/Studies:  Ct Chest Wo Contrast  Result Date: 05/11/2018 CLINICAL DATA:  58 year old male with hypoxia. EXAM: CT CHEST WITHOUT CONTRAST TECHNIQUE: Multidetector CT imaging of the chest was performed following the standard protocol without IV contrast. COMPARISON:  Chest radiograph dated 05/11/2018 FINDINGS: Evaluation of this exam is limited in the absence of intravenous contrast. Cardiovascular: There is mild cardiomegaly. No significant pericardial effusion. There is multi vessel coronary vascular calcification. There is hypoattenuation of the cardiac blood pool suggestive of a degree of anemia. Clinical correlation is recommended. There  is moderate atherosclerotic calcification of the thoracic aorta. The central pulmonary arteries are grossly  unremarkable. Mediastinum/Nodes: Top-normal paratracheal lymph nodes measure up to 10 mm in short axis. Subcarinal lymph node measures 17 mm in short axis. The esophagus is grossly unremarkable. No mediastinal fluid collection. Lungs/Pleura: There are small bilateral pleural effusions. There is diffuse interstitial and interlobular septal prominence with Kerley B-lines as well as patchy areas of central and perihilar ground-glass density. Findings most consistent with pulmonary edema with probable superimposed pneumonia. Clinical correlation is recommended. There is no pneumothorax. The central airways are patent. Upper Abdomen: There are stones within the gallbladder. There is slight irregularity of the liver contour which may represent early changes of cirrhosis. Clinical correlation is recommended. Musculoskeletal: Degenerative changes of the spine. No acute osseous pathology. IMPRESSION: 1. Findings most consistent with pulmonary edema with probable superimposed pneumonia. Clinical correlation is recommended. 2. Small bilateral pleural effusions. 3. Cholelithiasis. Electronically Signed   By: Anner Crete M.D.   On: 05/11/2018 19:59   US Renal  Result Date: 05/12/2018 CLINICAL DATA:  Renal injury. EXAM: RENAL / URINARY TRACT ULTRASOUND COMPLETE COMPARISON:  No prior. FINDINGS: Right Kidney: Renal measurements: 10.7 x 5.7 x 5.3 cm = volume: 168.2 mL. Increased echogenicity. No mass or hydronephrosis visualized. Left Kidney: Renal measurements: 10.6 x 7.0 x 6.7 cm = volume: 258.0 mL. Increased echogenicity. No mass or hydronephrosis visualized. Bladder: Foley catheter in the bladder. Right pleural effusion incidentally noted. IMPRESSION: 1. Increased renal echogenicity noted bilaterally. These findings consistent chronic medical renal disease. No acute abnormality identified. No hydronephrosis or bladder distention. 2.  Right pleural effusion is incidentally noted. Electronically Signed   By: [place]   On: 05/12/2018 11:40   Dg Chest Port 1 View  Result Date: 05/26/2018 CLINICAL DATA:  Shortness of breath. EXAM: PORTABLE CHEST 1 VIEW COMPARISON:  05/13/2018. FINDINGS: Mildly enlarged cardiac silhouette with a mild increase in size. Resolved bilateral airspace opacity. Residual interstitial prominence with [employer]. No visible pleural fluid. The right lateral costophrenic angle is not included. The lungs are mildly hyperexpanded. Thoracic spine degenerative changes. IMPRESSION: Improving changes of congestive heart failure superimposed on COPD with residual interstitial pulmonary edema. Electronically Signed   By: Claudie Revering M.D.   On: 05/26/2018 20:26   Dg Chest Port 1 View  Result Date: 05/13/2018 CLINICAL DATA:  Pulmonary edema short of breath EXAM: PORTABLE CHEST 1 VIEW COMPARISON:  05/12/2018 FINDINGS: Progression of by lateral airspace disease which is perihilar in location. No effusion. Heart size upper normal. IMPRESSION: Progression of bilateral airspace disease compatible with edema. Electronically Signed   By: Franchot Gallo M.D.   On: 05/13/2018 07:48   Dg Chest Port 1 View  Result Date: 05/12/2018 CLINICAL DATA:  Shortness of breath.  Respiratory failure. EXAM: PORTABLE CHEST 1 VIEW COMPARISON:  May 11, 2018 FINDINGS: No pneumothorax. Increasing bilateral pulmonary opacities, particularly in the bases. Diffuse interstitial prominence. The heart size is borderline. No other interval changes. IMPRESSION: 1. Worsening of bilateral pulmonary opacities, suggested to represent pneumonia superimposed on a background of edema on the CT scan from May 11, 2018. Recommend clinical correlation and follow-up to resolution. Electronically Signed   By: Dorise Bullion III M.D   On: 05/12/2018 08:34   Dg Chest Port 1 View  Result Date: 05/11/2018 CLINICAL DATA:  Shortness of breath, weakness, and vomiting. EXAM: PORTABLE CHEST 1 VIEW COMPARISON:  05/25/2017 FINDINGS: Mild  cardiomegaly. Moderate diffuse interstitial infiltrates with Kerley B-lines, consistent  with diffuse interstitial edema. No evidence of focal consolidation or significant pleural effusion. IMPRESSION: Cardiomegaly and diffuse interstitial edema, consistent with congestive heart failure. Electronically Signed   By: Earle Gell M.D.   On: 05/11/2018 18:47   Vas [place] Lower Extremity Venous (dvt)  Result Date: 05/12/2018  Lower Venous Study Indications: SOB.  Performing Technologist: Toma Copier RVS  Examination Guidelines: A complete evaluation includes B-mode imaging, spectral Doppler, color Doppler, and power Doppler as needed of all accessible portions of each vessel. Bilateral testing is considered an integral part of a complete examination. Limited examinations for reoccurring indications may be performed as noted.  Right Venous Findings: +---------+---------------+---------+-----------+----------+-------+          CompressibilityPhasicitySpontaneityPropertiesSummary +---------+---------------+---------+-----------+----------+-------+ CFV      Full           Yes      Yes                          +---------+---------------+---------+-----------+----------+-------+ SFJ      Full                                                 +---------+---------------+---------+-----------+----------+-------+ FV Prox  Full           Yes      Yes                          +---------+---------------+---------+-----------+----------+-------+ FV Mid   Full                                                 +---------+---------------+---------+-----------+----------+-------+ FV DistalFull           Yes      Yes                          +---------+---------------+---------+-----------+----------+-------+ PFV      Full           Yes      Yes                          +---------+---------------+---------+-----------+----------+-------+ POP      Full           Yes      Yes                           +---------+---------------+---------+-----------+----------+-------+ PTV      Full                                                 +---------+---------------+---------+-----------+----------+-------+ PERO     Full                                                 +---------+---------------+---------+-----------+----------+-------+ Mild technical difficulty due to involuntary jerking of the legs  Left  Venous Findings: +---------+---------------+---------+-----------+----------+-------+          CompressibilityPhasicitySpontaneityPropertiesSummary +---------+---------------+---------+-----------+----------+-------+ CFV      Full           Yes      Yes                          +---------+---------------+---------+-----------+----------+-------+ SFJ      Full                                                 +---------+---------------+---------+-----------+----------+-------+ FV Prox  Full           Yes      Yes                          +---------+---------------+---------+-----------+----------+-------+ FV Mid   Full                                                 +---------+---------------+---------+-----------+----------+-------+ FV DistalFull           Yes      Yes                          +---------+---------------+---------+-----------+----------+-------+ PFV      Full           Yes      Yes                          +---------+---------------+---------+-----------+----------+-------+ POP      Full           Yes      Yes                          +---------+---------------+---------+-----------+----------+-------+ PTV      Full                                                 +---------+---------------+---------+-----------+----------+-------+ PERO     Full                                                 +---------+---------------+---------+-----------+----------+-------+ Mild technical difficulty due to involuntary  jerking of the leg    Summary: Right: There is no evidence of deep vein thrombosis in the lower extremity. No cystic structure found in the popliteal fossa. Left: There is no evidence of deep vein thrombosis in the lower extremity. No cystic structure found in the popliteal fossa.  *See table(s) above for measurements and observations. Electronically signed by Monica Martinez MD on 05/12/2018 at 7:25:49 PM.    Final     Wt Readings from Last 3 Encounters:  05/26/18 78.2 kg  05/17/18 78.2 kg  02/04/18 80.3 kg    EKG: Normal sinus rhythm with nonspecific ST-T changes in leads V3, V4.  Physical Exam: Blood pressure 140/63,  pulse 78, temperature 98 F (36.7 C), temperature source Oral, resp. rate (!) 25, height 5\' 8"  (1.727 m), weight 78.2 kg, SpO2 95 %. Body mass index is 26.21 kg/m. General: Well developed, well nourished, in no acute distress. Head: Normocephalic, atraumatic, sclera non-icteric, no xanthomas, nares are without discharge.  Neck: Negative for carotid bruits. JVD not elevated. Lungs: Clear bilaterally to auscultation without wheezes, bilateral rales noted up to 1/3 of lung fields, no rhonchi. Breathing is unlabored. Heart: RRR with S1 S2. No murmurs, rubs, or gallops appreciated. Abdomen: Soft, non-tender, non-distended with normoactive bowel sounds. No hepatomegaly. No rebound/guarding. No obvious abdominal masses. Msk:  Strength and tone appear normal for age. Extremities: No clubbing or cyanosis. No edema.  Distal pedal pulses are 2+ and equal bilaterally.  Crusted scabs on lower extremities, upper extremities wrapped in Kerlix dressings due to weeping scab wounds. Neuro: Alert and oriented X 3. No facial asymmetry. No focal deficit. Moves all extremities spontaneously. Psych:  Responds to questions appropriately with a normal affect.     Assessment and Plan  William [name] is a 58 y.o. male with a history of NSTEMI (recently admitted from 05/11/18-05/19/18), chronic  systolic heart failure (EF 30 to 35%), diabetes type 2, hypertension presents to the ED emergency department with 1 week of shortness of breath.  #Shortness of breath secondary in part to acute on chronic systolic heart failure: Patient presents with 1 week of progressive shortness of breath and new oxygen requirement.  His nursing home reports decreased p.o. intake and his current weight is identical to his discharge weight.  He demonstrates no JVD or peripheral edema; however, he does have rales up to one third of the way up his lung fields.  The chest x-ray was read as improved pulmonary edema compared to prior suggesting that this is resolving pulmonary edema rather than acute exacerbation.  His new oxygen requirement is concerning and I agree with the primary team's plan for VQ scan to evaluate for pulmonary embolus.  The patient reports having a autoimmune condition causing his scabbing on his arms and legs, raising the possibility of an autoimmune process causing lung destruction as well.  However, the patient had a CT non-con during his last admission which demonstrated pulmonary edema without a clear other pulmonary process.  His Lasix dosing has been intermittent due to his CKD so perhaps his current hypoxia is due to inadequate diuresis.  Will recommend gentle diuresis and appreciate nephrology consultation given his stage IV CKD. - Recommend repeat echo in a.m. to evaluate for acute causes of pulmonary edema including valvular dysfunction - Recommend gentle diuresis with 20 mg of IV Lasix (can re-dose if does not put out 500 to 1 L) -Agree with VQ scan -Continue home carvedilol  #Coronary artery disease: Patient has a history of coronary artery disease with a recent NSTEMI that was treated medically.  He currently denies chest pain.  Recommend continuing CAD medical therapy. -Continue aspirin, atorvastatin, carvedilol, Plavix   Signed, Lujean Amel, MD 05/26/2018, 11:49 PM

## 2018-05-26 NOTE — H&P (Signed)
History and Physical   William [name]:[id number] DOB: 1960-12-15 DOA: 05/26/2018  Referring MD/NP/PA: Dr. Ronnald Nian  PCP: Marin Olp, MD   Patient coming from: [facility] skilled facility  Chief Complaint: Shortness of breath  HPI: [name] is a 58 y.o. male with medical history significant of CVA in 2007 with residual cognitive impairment, learning disability, left hemiparesis, diabetes with diabetic neuropathy and nephropathy, chronic kidney disease stage IV, hypertension, hyperlipidemia, who presented with progressive shortness of breath from the nursing facility.  He also has had decreased oral intake in the last 2 weeks.  Patient has worsening renal function.  Not on any diuretics.  He was not needing oxygen but currently requires oxygen.  He has had some cough but no wheezing.  No fever.  No symptoms of any respiratory infection per facility records.  Patient was seen in the ER and no evidence of pneumonia on his chest x-ray.  Suspected PE with CHF as differentials.  He has been admitted to the medical service for work-up.  ED Course: Temperature is 99 blood pressure 141/73 pulse 78 respiratory rate of 29 oxygen sat is 89% on room air.  White count is 13,000 with hemoglobin 8.6.  Sodium 134 potassium 3.6.  BUN is 74 and creatinine 4.77.  Baseline is at 3.5.Troponin 0 0.28.  Next x-ray showed CHF with COPD findings  Review of Systems: As per HPI otherwise 10 point review of systems negative.    Past Medical History:  Diagnosis Date  . CEREBROVASCULAR ACCIDENT, HX OF 09/28/2006   2007 affected left side and peripheral vision  . DIABETES MELLITUS, TYPE II 09/28/2006  . GERD (gastroesophageal reflux disease)   . HYPERLIPIDEMIA 09/28/2006  . HYPERTENSION 09/28/2006  . Learning disability   . Stroke [facility]) years ago    Past Surgical History:  Procedure Laterality Date  . COLONOSCOPY    . LYMPHADENECTOMY    . RHINOPLASTY    . SIGMOIDOSCOPY       reports that  he has been smoking cigarettes. He has been smoking about 1.00 pack per day. He has quit using smokeless tobacco. He reports that he does not drink alcohol or use drugs.  Allergies  Allergen Reactions  . Sulfamethoxazole Rash    Family History  Problem Relation Age of Onset  . Lung cancer Mother   . Diabetes Father   . Stroke Father   . Alcohol abuse Brother   . Diabetes Brother   . Heart attack Sister   . Colon cancer Neg Hx      Prior to Admission medications   Medication Sig Start Date End Date Taking? Authorizing Provider  acetaminophen (TYLENOL) 325 MG tablet Take 650 mg by mouth every 6 (six) hours as needed for fever.   Yes [provider]  aspirin 81 MG chewable tablet Chew 1 tablet (81 mg total) by mouth daily. 05/19/18  Yes Kathyrn Drown D, NP  atorvastatin (LIPITOR) 80 MG tablet Take 1 tablet (80 mg total) by mouth daily at 6 PM. 05/18/18  Yes Kathyrn Drown D, NP  buPROPion [facility] SR) 150 MG 12 hr tablet Take 150 mg by mouth 2 (two) times daily.   Yes [provider]  carvedilol (COREG) 12.5 MG tablet Take 1 tablet (12.5 mg total) by mouth 2 (two) times daily with a meal. 05/18/18  Yes Kathyrn Drown D, NP  clopidogrel (PLAVIX) 75 MG tablet Take 1 tablet (75 mg total) by mouth daily. 05/19/18  Yes Tommie Raymond,  NP  escitalopram (LEXAPRO) 10 MG tablet Take 10 mg by mouth daily.   Yes [provider]  ferrous sulfate 325 (65 FE) MG tablet Take 1 tablet (325 mg total) by mouth 2 (two) times daily with a meal. Patient taking differently: Take 325 mg by mouth daily with breakfast.  03/24/17  Yes Marin Olp, MD  GLUCERNA [facility]) LIQD Take 237 mLs by mouth 2 (two) times daily.   Yes [provider]  hydrALAZINE (APRESOLINE) 50 MG tablet Take 1 tablet (50 mg total) by mouth every 8 (eight) hours. 05/18/18  Yes Kathyrn Drown D, NP  hydrOXYzine (ATARAX/VISTARIL) 25 MG tablet Take 1 tablet (25 mg total) by mouth every 6 (six) hours  as needed for itching. 05/07/18  Yes Street, [place], PA-C  Hypromellose (ARTIFICIAL TEARS) 0.4 % SOLN Place 2 drops into both eyes 2 (two) times daily.    Yes [provider]  insulin aspart (NOVOLOG PENFILL) cartridge Inject 20-24 Units into the skin 3 (three) times daily before meals. 20 units in the morning. 24 units in the afternoon. 20 units in the evening 11/24/17  Yes [provider]  Insulin Glargine (LANTUS SOLOSTAR) 100 UNIT/ML Solostar Pen Inject 62 Units into the skin at bedtime.  11/17/17  Yes [provider]  isosorbide mononitrate (IMDUR) 60 MG 24 hr tablet Take 1 tablet (60 mg total) by mouth daily. 05/19/18  Yes Kathyrn Drown D, NP  linagliptin (TRADJENTA) 5 MG TABS tablet Take 5 mg by mouth daily. 09/27/17  Yes [provider]  pantoprazole (PROTONIX) 40 MG tablet Take 1 tablet (40 mg total) by mouth daily. 05/19/18  Yes Kathyrn Drown D, NP  simethicone (MYLICON) 80 MG chewable tablet Chew 160 mg by mouth 3 (three) times daily as needed for flatulence.   Yes [provider]    Physical Exam: Vitals:   05/26/18 2000 05/26/18 2015 05/26/18 2045 05/26/18 2100  BP: 122/75 121/82 128/64 140/63  Pulse: 77 78 76 76  Resp: (!) 29 (!) 24 (!) 26 (!) 23  Temp:      TempSrc:      SpO2: 92% 97% (!) 89% 91%  Weight:      Height:          Constitutional: NAD, calm, comfortable, chronically ill looking laying in bed Vitals:   05/26/18 2000 05/26/18 2015 05/26/18 2045 05/26/18 2100  BP: 122/75 121/82 128/64 140/63  Pulse: 77 78 76 76  Resp: (!) 29 (!) 24 (!) 26 (!) 23  Temp:      TempSrc:      SpO2: 92% 97% (!) 89% 91%  Weight:      Height:       Eyes: PERRL, lids and conjunctivae normal ENMT: Mucous membranes are moist. Posterior pharynx clear of any exudate or lesions.Normal dentition.  Neck: normal, supple, no masses, no thyromegaly Respiratory: Decreased air entry at the bases, no significant wheezing but marked bilateral  crackles. Normal respiratory effort. No accessory muscle use.  Cardiovascular: Regular rate and rhythm, no murmurs / rubs / gallops. No extremity edema. 2+ pedal pulses. No carotid bruits.  Abdomen: no tenderness, no masses palpated. No hepatosplenomegaly. Bowel sounds positive.  Musculoskeletal: no clubbing / cyanosis. No joint deformity upper and lower extremities. Good ROM, no contractures. Normal muscle tone.  Skin: no rashes, lesions, ulcers. No induration Neurologic: Left-sided hemiparesis Psychiatric:  Alert and oriented x 3. Normal mood.     Labs on Admission: I have personally reviewed following labs and  imaging studies  CBC: Recent Labs  Lab 05/26/18 1947  WBC 13.0*  NEUTROABS 10.5*  HGB 8.6*  HCT 27.4*  MCV 87.8  PLT 301   Basic Metabolic Panel: Recent Labs  Lab 05/26/18 1947  NA 134*  K 3.6  CL 98  CO2 22  GLUCOSE 177*  BUN 74*  CREATININE 4.77*  CALCIUM 7.9*   GFR: Estimated Creatinine Clearance: 16.1 mL/min (A) (by C-G formula based on SCr of 4.77 mg/dL (H)). Liver Function Tests: Recent Labs  Lab 05/26/18 1947  AST 21  ALT 22  ALKPHOS 72  BILITOT 0.7  PROT 7.1  ALBUMIN 2.0*   No results for input(s): LIPASE, AMYLASE in the last 168 hours. No results for input(s): AMMONIA in the last 168 hours. Coagulation Profile: No results for input(s): INR, PROTIME in the last 168 hours. Cardiac Enzymes: No results for input(s): CKTOTAL, CKMB, CKMBINDEX, TROPONINI in the last 168 hours. BNP (last 3 results) No results for input(s): PROBNP in the last 8760 hours. HbA1C: No results for input(s): HGBA1C in the last 72 hours. CBG: No results for input(s): GLUCAP in the last 168 hours. Lipid Profile: No results for input(s): CHOL, HDL, LDLCALC, TRIG, CHOLHDL, LDLDIRECT in the last 72 hours. Thyroid Function Tests: No results for input(s): TSH, T4TOTAL, FREET4, T3FREE, THYROIDAB in the last 72 hours. Anemia Panel: No results for input(s): VITAMINB12,  FOLATE, FERRITIN, TIBC, IRON, RETICCTPCT in the last 72 hours. Urine analysis:    Component Value Date/Time   COLORURINE YELLOW 05/12/2018 0602   APPEARANCEUR CLOUDY (A) 05/12/2018 0602   LABSPEC 1.014 05/12/2018 0602   PHURINE 5.0 05/12/2018 0602   GLUCOSEU NEGATIVE 05/12/2018 0602   HGBUR MODERATE (A) 05/12/2018 0602   BILIRUBINUR NEGATIVE 05/12/2018 0602   KETONESUR NEGATIVE 05/12/2018 0602   PROTEINUR 100 (A) 05/12/2018 0602   UROBILINOGEN 0.2 09/19/2010 0945   NITRITE NEGATIVE 05/12/2018 0602   LEUKOCYTESUR NEGATIVE 05/12/2018 0602   Sepsis Labs: @LABRCNTIP (procalcitonin:4,lacticidven:4) )No results found for this or any previous visit (from the past 240 hour(s)).   Radiological Exams on Admission: Dg Chest Port 1 View  Result Date: 05/26/2018 CLINICAL DATA:  Shortness of breath. EXAM: PORTABLE CHEST 1 VIEW COMPARISON:  05/13/2018. FINDINGS: Mildly enlarged cardiac silhouette with a mild increase in size. Resolved bilateral airspace opacity. Residual interstitial prominence with [employer]. No visible pleural fluid. The right lateral costophrenic angle is not included. The lungs are mildly hyperexpanded. Thoracic spine degenerative changes. IMPRESSION: Improving changes of congestive heart failure superimposed on COPD with residual interstitial pulmonary edema. Electronically Signed   By: Claudie Revering M.D.   On: 05/26/2018 20:26    EKG: Independently reviewed.  Normal sinus rhythm with a rate of 79.  Inverted T waves in the lateral leads not new.  Assessment/Plan Principal Problem:   Acute respiratory failure with hypoxemia (HCC) Active Problems:   Type II diabetes mellitus with renal manifestations (HCC)   Hyperlipidemia   Tobacco use disorder   Hypertension associated with diabetes ([place])   Stage 3 chronic renal impairment associated with type 2 diabetes mellitus ([place])   Pulmonary edema     #1 acute respiratory failure with hypoxemia: Based on chest x-ray findings  most likely due to CHF.  Patient's creatinine has worsened from 3.5-4.7.  Other possibilities include PE.  Patient cannot get a CTA.  VQ scan is likely going to be ordered.  Empirically start on heparin drip.  Also diuresis.  Nephrology been consulted due to worsening renal function adequate diuresis  may be difficult to obtain.  Admit to cardiac telemetry.  #2 diabetes: Sliding scale insulin with regimen from home.  #3 hyperlipidemia: Continue with statin  #4 stage IV chronic kidney disease: Nephrology consulted.  Continue per recommendations.  #5 pulmonary edema: Patient had previous systolic dysfunction.  His echo in January showed an EF of 30 to 35%.  He is not on diuretics in the skilled facility at least on arrival.  Cardiology consulted.  #6 tobacco abuse: Patient will require nicotine patch.  #7 cognitive impairment: Largely related to previous CVA.  Patient at baseline  #8 leukocytosis: Patient had recent non-ST elevation MI.  May be related to underlying pneumonia also.  Continue to monitor  #9 coronary artery disease: Status post recent non-ST elevation MI.  Appreciate cardiology's involvement.  Troponin still elevated but may be due to renal dysfunction  #10 anemia of chronic disease: Hemoglobin is low at 8.6.  Continue monitoring H&H   DVT prophylaxis: Heparin Code Status: DNR Family Communication: Family spoken to on the phone Disposition Plan: Back to facility Consults called: Cardiology and nephrology Admission status: Inpatient  Severity of Illness: The appropriate patient status for this patient is INPATIENT. Inpatient status is judged to be reasonable and necessary in order to provide the required intensity of service to ensure the patient's safety. The patient's presenting symptoms, physical exam findings, and initial radiographic and laboratory data in the context of their chronic comorbidities is felt to place them at high risk for further clinical deterioration.  Furthermore, it is not anticipated that the patient will be medically stable for discharge from the hospital within 2 midnights of admission. The following factors support the patient status of inpatient.   " The patient's presenting symptoms include shortness of breath. " The worrisome physical exam findings include left-sided hemiparesis with marked crackles in the lungs. " The initial radiographic and laboratory data are worrisome because of chest x-ray findings of pulmonary edema. " The chronic co-morbidities include CHF with diabetes.   * I certify that at the point of admission it is my clinical judgment that the patient will require inpatient hospital care spanning beyond 2 midnights from the point of admission due to high intensity of service, high risk for further deterioration and high frequency of surveillance required.Barbette Merino MD [facility] Pager [phone]  If 7PM-7AM, please contact night-coverage [email or web address] Password [facility]  05/26/2018, 9:19 PM

## 2018-05-26 NOTE — Progress Notes (Signed)
ANTICOAGULATION CONSULT NOTE - Initial Consult  Pharmacy Consult for heparin Indication: pulmonary embolus  Allergies  Allergen Reactions  . Sulfamethoxazole Rash    Patient Measurements: Height: 5\' 8"  (172.7 cm) Weight: 172 lb 6.4 oz (78.2 kg) IBW/kg (Calculated) : 68.4 Heparin Dosing Weight: 78.2 kg   Vital Signs: Temp: 99 F (37.2 C) (02/06 1952) Temp Source: Rectal (02/06 1952) BP: 141/73 (02/06 1928) Pulse Rate: 78 (02/06 1928)  Labs: Recent Labs    05/26/18 1947  HGB 8.6*  HCT 27.4*  PLT 328  CREATININE 4.77*    Estimated Creatinine Clearance: 16.1 mL/min (A) (by C-G formula based on SCr of 4.77 mg/dL (H)).   Medical History: Past Medical History:  Diagnosis Date  . CEREBROVASCULAR ACCIDENT, HX OF 09/28/2006   2007 affected left side and peripheral vision  . DIABETES MELLITUS, TYPE II 09/28/2006  . GERD (gastroesophageal reflux disease)   . HYPERLIPIDEMIA 09/28/2006  . HYPERTENSION 09/28/2006  . Learning disability   . Stroke [facility]) years ago    Assessment: 89 yom presenting with SOB and decreased oral intake. Has hx of CVA - on plavix PTA. No anticoag PTA.   Hgb 8.6, plt 328. D-dimer 6.63. Trop 0.28. No s/sx of bleeding.  Goal of Therapy:  Heparin level 0.3-0.7 units/ml Monitor platelets by anticoagulation protocol: Yes   Plan:  Give 3900 units bolus x 1 Start heparin infusion at 1250 units/hr Check anti-Xa level in 8 hours and daily while on heparin Continue to monitor H&H and platelets  F/u work-up for PE to determine duration of heparin  Antonietta Jewel, PharmD, BCCCP Clinical Pharmacist  Pager: [phone] Phone: [phone] 05/26/2018,9:00 PM

## 2018-05-26 NOTE — ED Provider Notes (Signed)
[facility] DEPARTMENT Provider Note   CSN: [id number] Arrival date & time: 05/26/18  1921     History   Chief Complaint Chief Complaint  Patient presents with  . Shortness of Breath    HPI William [name] is a 58 y.o. male.  The history is provided by the patient.  Shortness of Breath  Severity:  Moderate Onset quality:  Gradual Timing:  Constant Progression:  Worsening Chronicity:  New Context: not activity and not URI   Relieved by:  Nothing Worsened by:  Nothing Associated symptoms: no abdominal pain, no chest pain, no cough, no ear pain, no fever, no rash, no sore throat, no sputum production and no vomiting   Risk factors: prolonged immobilization   Risk factors: no hx of PE/DVT   Risk factors comment:  DM, HLD, HTN, CAD   Past Medical History:  Diagnosis Date  . CEREBROVASCULAR ACCIDENT, HX OF 09/28/2006   2007 affected left side and peripheral vision  . DIABETES MELLITUS, TYPE II 09/28/2006  . GERD (gastroesophageal reflux disease)   . HYPERLIPIDEMIA 09/28/2006  . HYPERTENSION 09/28/2006  . Learning disability   . Stroke [facility]) years ago    Patient Active Problem List   Diagnosis Date Noted  . Acute respiratory failure with hypoxemia ([place]) 05/26/2018  . Pulmonary edema   . Hypokalemia   . Acute respiratory failure with hypoxia ([place])   . ST elevation myocardial infarction (STEMI) ([place])   . NSTEMI (non-ST elevated myocardial infarction) ([place])   . Acute pulmonary edema (HCC)   . Systolic heart failure ([place]) 05/11/2018  . Hypertension associated with stage 3 chronic kidney disease due to type 2 diabetes mellitus ([place]) 02/04/2018  . Hypertension associated with diabetes ([place]) 08/31/2017  . Hemiparesis affecting left side as late effect of cerebrovascular accident ([place]) 08/31/2017  . Dyslipidemia associated with type 2 diabetes mellitus ([place]) 08/31/2017  . Type 2 diabetes mellitus with stage 3 chronic kidney disease, with  long-term current use of insulin ([place]) 08/31/2017  . Stage 3 chronic renal impairment associated with type 2 diabetes mellitus ([place]) 08/31/2017  . Anemia of chronic disease 08/31/2017  . Stroke (cerebrum) ([place]) 08/19/2017  . Frequent falls   . Urinary incontinence   . Abnormal gait 07/14/2017  . Depression, major, single episode, mild ([place]) 04/23/2017  . Inactivity 02/09/2017  . Iron deficiency anemia 06/25/2015  . CKD (chronic kidney disease), stage III ([place]) 04/30/2014  . Tobacco use disorder 11/21/2013  . GERD without esophagitis 09/25/2013  . Type II diabetes mellitus with renal manifestations ([place]) 09/28/2006  . Hyperlipidemia 09/28/2006  . Essential hypertension 09/28/2006  . History of cardiovascular disorder 09/28/2006    Past Surgical History:  Procedure Laterality Date  . COLONOSCOPY    . LYMPHADENECTOMY    . RHINOPLASTY    . SIGMOIDOSCOPY          Home Medications    Prior to Admission medications   Medication Sig Start Date End Date Taking? Authorizing Provider  acetaminophen (TYLENOL) 325 MG tablet Take 650 mg by mouth every 6 (six) hours as needed for fever.   Yes [provider]  aspirin 81 MG chewable tablet Chew 1 tablet (81 mg total) by mouth daily. 05/19/18  Yes Kathyrn Drown D, NP  atorvastatin (LIPITOR) 80 MG tablet Take 1 tablet (80 mg total) by mouth daily at 6 PM. 05/18/18  Yes Kathyrn Drown D, NP  buPROPion [facility] SR) 150 MG 12 hr tablet Take 150 mg by mouth  2 (two) times daily.   Yes [provider]  carvedilol (COREG) 12.5 MG tablet Take 1 tablet (12.5 mg total) by mouth 2 (two) times daily with a meal. 05/18/18  Yes Kathyrn Drown D, NP  clopidogrel (PLAVIX) 75 MG tablet Take 1 tablet (75 mg total) by mouth daily. 05/19/18  Yes Kathyrn Drown D, NP  escitalopram (LEXAPRO) 10 MG tablet Take 10 mg by mouth daily.   Yes [provider]  ferrous sulfate 325 (65 FE) MG tablet Take 1 tablet (325 mg total) by mouth 2 (two)  times daily with a meal. Patient taking differently: Take 325 mg by mouth daily with breakfast.  03/24/17  Yes Marin Olp, MD  GLUCERNA [facility]) LIQD Take 237 mLs by mouth 2 (two) times daily.   Yes [provider]  hydrALAZINE (APRESOLINE) 50 MG tablet Take 1 tablet (50 mg total) by mouth every 8 (eight) hours. 05/18/18  Yes Kathyrn Drown D, NP  hydrOXYzine (ATARAX/VISTARIL) 25 MG tablet Take 1 tablet (25 mg total) by mouth every 6 (six) hours as needed for itching. 05/07/18  Yes Street, [place], PA-C  Hypromellose (ARTIFICIAL TEARS) 0.4 % SOLN Place 2 drops into both eyes 2 (two) times daily.    Yes [provider]  insulin aspart (NOVOLOG PENFILL) cartridge Inject 20-24 Units into the skin 3 (three) times daily before meals. 20 units in the morning. 24 units in the afternoon. 20 units in the evening 11/24/17  Yes [provider]  Insulin Glargine (LANTUS SOLOSTAR) 100 UNIT/ML Solostar Pen Inject 62 Units into the skin at bedtime.  11/17/17  Yes [provider]  isosorbide mononitrate (IMDUR) 60 MG 24 hr tablet Take 1 tablet (60 mg total) by mouth daily. 05/19/18  Yes Kathyrn Drown D, NP  linagliptin (TRADJENTA) 5 MG TABS tablet Take 5 mg by mouth daily. 09/27/17  Yes [provider]  pantoprazole (PROTONIX) 40 MG tablet Take 1 tablet (40 mg total) by mouth daily. 05/19/18  Yes Kathyrn Drown D, NP  simethicone (MYLICON) 80 MG chewable tablet Chew 160 mg by mouth 3 (three) times daily as needed for flatulence.   Yes [provider]    Family History Family History  Problem Relation Age of Onset  . Lung cancer Mother   . Diabetes Father   . Stroke Father   . Alcohol abuse Brother   . Diabetes Brother   . Heart attack Sister   . Colon cancer Neg Hx     Social History Social History   Tobacco Use  . Smoking status: Current Every Day Smoker    Packs/day: 1.00    Types: Cigarettes  . Smokeless tobacco: Former Network engineer Use  Topics  . Alcohol use: No    Alcohol/week: 0.0 standard drinks  . Drug use: No     Allergies   Sulfamethoxazole   Review of Systems Review of Systems  Constitutional: Negative for chills and fever.  HENT: Negative for ear pain and sore throat.   Eyes: Negative for pain and visual disturbance.  Respiratory: Positive for shortness of breath. Negative for cough and sputum production.   Cardiovascular: Negative for chest pain and palpitations.  Gastrointestinal: Negative for abdominal pain and vomiting.  Genitourinary: Negative for dysuria and hematuria.  Musculoskeletal: Negative for arthralgias and back pain.  Skin: Negative for color change and rash.  Neurological: Negative for seizures and syncope.  All other systems reviewed and are negative.    Physical Exam Updated Vital Signs BP  140/63   Pulse 78   Temp 98 F (36.7 C) (Oral)   Resp (!) 25   Ht 5\' 8"  (1.727 m)   Wt 78.2 kg   SpO2 95%   BMI 26.21 kg/m   Physical Exam Vitals signs and nursing note reviewed.  Constitutional:      General: He is not in acute distress.    Appearance: He is well-developed. He is ill-appearing.  HENT:     Head: Normocephalic and atraumatic.  Eyes:     Extraocular Movements: Extraocular movements intact.     Conjunctiva/sclera: Conjunctivae normal.     Pupils: Pupils are equal, round, and reactive to light.  Neck:     Musculoskeletal: Normal range of motion and neck supple.  Cardiovascular:     Rate and Rhythm: Normal rate and regular rhythm.     Heart sounds: Normal heart sounds. No murmur.  Pulmonary:     Effort: Pulmonary effort is normal. No respiratory distress.     Breath sounds: Normal breath sounds. No decreased breath sounds, wheezing, rhonchi or rales.  Abdominal:     Palpations: Abdomen is soft.     Tenderness: There is no abdominal tenderness.  Musculoskeletal:     Right lower leg: No edema.     Left lower leg: No edema.  Skin:    General: Skin is warm and dry.      Capillary Refill: Capillary refill takes less than 2 seconds.  Neurological:     General: No focal deficit present.     Mental Status: He is alert.  Psychiatric:        Mood and Affect: Mood normal.      ED Treatments / Results  Labs (all labs ordered are listed, but only abnormal results are displayed) Labs Reviewed  COMPREHENSIVE METABOLIC PANEL - Abnormal; Notable for the following components:      Result Value   Sodium 134 (*)    Glucose, Bld 177 (*)    BUN 74 (*)    Creatinine, Ser 4.77 (*)    Calcium 7.9 (*)    Albumin 2.0 (*)    GFR calc non Af Amer 12 (*)    GFR calc Af Amer 14 (*)    All other components within normal limits  CBC WITH DIFFERENTIAL/PLATELET - Abnormal; Notable for the following components:   WBC 13.0 (*)    RBC 3.12 (*)    Hemoglobin 8.6 (*)    HCT 27.4 (*)    Neutro Abs 10.5 (*)    Monocytes Absolute 1.1 (*)    Abs Immature Granulocytes 0.11 (*)    All other components within normal limits  D-DIMER, QUANTITATIVE (NOT AT [facility]) - Abnormal; Notable for the following components:   D-Dimer, Quant 6.63 (*)    All other components within normal limits  BRAIN NATRIURETIC PEPTIDE - Abnormal; Notable for the following components:   B Natriuretic Peptide 490.8 (*)    All other components within normal limits  I-STAT TROPONIN, ED - Abnormal; Notable for the following components:   Troponin i, poc 0.28 (*)    All other components within normal limits  HEPARIN LEVEL (UNFRACTIONATED)  CBC  RENAL FUNCTION PANEL    EKG EKG Interpretation  Date/Time:  Thursday May 26 2018 19:24:50 EST Ventricular Rate:  79 PR Interval:    QRS Duration: 81 QT Interval:  393 QTC Calculation: 451 R Axis:   13 Text Interpretation:  Sinus rhythm Anteroseptal infarct, old Borderline repolarization abnormality Confirmed by Quita Skye,  Raymel Cull [phone]) on 05/26/2018 8:19:52 PM   Radiology Dg Chest Port 1 View  Result Date: 05/26/2018 CLINICAL DATA:  Shortness of  breath. EXAM: PORTABLE CHEST 1 VIEW COMPARISON:  05/13/2018. FINDINGS: Mildly enlarged cardiac silhouette with a mild increase in size. Resolved bilateral airspace opacity. Residual interstitial prominence with [employer]. No visible pleural fluid. The right lateral costophrenic angle is not included. The lungs are mildly hyperexpanded. Thoracic spine degenerative changes. IMPRESSION: Improving changes of congestive heart failure superimposed on COPD with residual interstitial pulmonary edema. Electronically Signed   By: Claudie Revering M.D.   On: 05/26/2018 20:26    Procedures .Critical Care Performed by: Lennice Sites, DO Authorized by: Lennice Sites, DO   Critical care provider statement:    Critical care time (minutes):  40   Critical care was necessary to treat or prevent imminent or life-threatening deterioration of the following conditions:  Respiratory failure and renal failure   Critical care was time spent personally by me on the following activities:  Blood draw for specimens, discussions with consultants, discussions with primary provider, evaluation of patient's response to treatment, examination of patient, obtaining history from patient or surrogate, ordering and performing treatments and interventions, ordering and review of laboratory studies, ordering and review of radiographic studies, pulse oximetry, re-evaluation of patient's condition and review of old charts   I assumed direction of critical care for this patient from another provider in my specialty: no     (including critical care time)  Medications Ordered in ED Medications  ferrous sulfate tablet 325 mg (has no administration in time range)  insulin glargine (LANTUS) injection 62 Units (has no administration in time range)  linagliptin (TRADJENTA) tablet 5 mg (has no administration in time range)  hydrOXYzine (ATARAX/VISTARIL) tablet 25 mg (has no administration in time range)  escitalopram (LEXAPRO) tablet 10 mg  (has no administration in time range)  acetaminophen (TYLENOL) tablet 650 mg (has no administration in time range)  simethicone (MYLICON) chewable tablet 160 mg (has no administration in time range)  aspirin chewable tablet 81 mg (has no administration in time range)  atorvastatin (LIPITOR) tablet 80 mg (has no administration in time range)  carvedilol (COREG) tablet 12.5 mg (has no administration in time range)  hydrALAZINE (APRESOLINE) tablet 50 mg (has no administration in time range)  isosorbide mononitrate (IMDUR) 24 hr tablet 60 mg (has no administration in time range)  pantoprazole (PROTONIX) EC tablet 40 mg (has no administration in time range)  clopidogrel (PLAVIX) tablet 75 mg (has no administration in time range)  buPROPion (WELLBUTRIN SR) 12 hr tablet 150 mg (has no administration in time range)  feeding supplement (GLUCERNA SHAKE) (GLUCERNA SHAKE) liquid 237 mL (has no administration in time range)  acetaminophen (TYLENOL) tablet 650 mg (has no administration in time range)    Or  acetaminophen (TYLENOL) suppository 650 mg (has no administration in time range)  zolpidem (AMBIEN) tablet 5 mg (has no administration in time range)  camphor-menthol (SARNA) lotion 1 application (has no administration in time range)    And  hydrOXYzine (ATARAX/VISTARIL) tablet 25 mg (has no administration in time range)  sorbitol 70 % solution 30 mL (has no administration in time range)  docusate sodium (ENEMEEZ) enema 283 mg (has no administration in time range)  ondansetron (ZOFRAN) tablet 4 mg (has no administration in time range)    Or  ondansetron (ZOFRAN) injection 4 mg (has no administration in time range)  calcium carbonate (dosed in mg elemental calcium) suspension 500 mg  of elemental calcium (has no administration in time range)  feeding supplement (NEPRO CARB STEADY) liquid 237 mL (has no administration in time range)  insulin aspart (novoLOG) injection 0-9 Units (has no administration in  time range)  insulin aspart (novoLOG) injection 0-5 Units (has no administration in time range)  sodium chloride flush (NS) 0.9 % injection 3 mL (has no administration in time range)  sodium chloride flush (NS) 0.9 % injection 3 mL (has no administration in time range)  0.9 %  sodium chloride infusion (has no administration in time range)  furosemide (LASIX) injection 40 mg (has no administration in time range)  heparin ADULT infusion 100 units/mL (25000 units/25mL sodium chloride 0.45%) (1,250 Units/hr Intravenous Handoff 05/26/18 2211)  nicotine (NICODERM CQ - dosed in mg/24 hours) patch 21 mg (has no administration in time range)  polyvinyl alcohol (LIQUIFILM TEARS) 1.4 % ophthalmic solution 2 drop (has no administration in time range)  heparin bolus via infusion 3,900 Units (3,900 Units Intravenous Bolus from Bag 05/26/18 2131)     Initial Impression / Assessment and Plan / ED Course  I have reviewed the triage vital signs and the nursing notes.  Pertinent labs & imaging results that were available during my care of the patient were reviewed by me and considered in my medical decision making (see chart for details).     William [name] is a 58 year old male with history of diabetes, hypertension, high cholesterol, stroke who presents to the ED with shortness of breath.  Patient hypoxic upon arrival that improved on 3 L of oxygen.  Patient otherwise with normal vitals.  No fever.  Patient recently admitted and treated medically for a non-ST elevation myocardial infarction.  He has been back at facility and started to develop worsening shortness of breath today.  Has overall clear breath sounds on exam.  There is no peripheral edema.  EKG shows sinus rhythm.  No new ischemic changes.  Patient does not have any wheezing on exam.  Did have an elevated d-dimer last ED stay and hospital visit but the never had a VQ scan done.  Concern for infectious process, cardiac process, pulmonary process  including pulmonary embolism.  Will evaluate with lab work, chest x-ray.  Patient with elevated troponin, elevated BNP.  However chest x-ray shows overall improvement of vascular congestion.  No pneumonia.  Patient does have elevated creatinine at 4.77 that is slightly worse than his baseline.  Otherwise no significant anemia.  Mild leukocytosis.  Patient continues to have elevated d-dimer.  Overall patient with acute hypoxic respiratory failure that appears multifactorial.  Concern for possible PE versus continued volume overload.  We will empirically start the patient on IV heparin.  Cardiology has been consulted and will evaluate but at this time less concerned about an acute coronary syndrome.  Likely volume overload versus PE.  Possible renal failure as well and nephrology was consulted and will come and evaluate the patient in the morning.  Doubt infectious process.  Patient admitted to hospitalist service for further care.  Remained hemodynamically stable on heparin drip.  This chart was dictated using voice recognition software.  Despite best efforts to proofread,  errors can occur which can change the documentation meaning.    F inal Clinical Impressions(s) / ED Diagnoses   Final diagnoses:  Acute respiratory failure with hypoxia (HCC)  Troponin level elevated  AKI (acute kidney injury) [facility])    ED Discharge Orders    None       Jahaan Vanwagner,  [place], DO 05/26/18 2344

## 2018-05-26 NOTE — ED Triage Notes (Signed)
BIB [facility] for shortness of breath and decreased intake. Per EMS facility reports pt has not been eating or drinking regularly X 2 weeks. Arms are wrapped in gauze due to auto immune disease causing pt to scratch repeatedly.

## 2018-05-27 ENCOUNTER — Inpatient Hospital Stay (HOSPITAL_COMMUNITY): Payer: Medicare Other

## 2018-05-27 DIAGNOSIS — I5043 Acute on chronic combined systolic (congestive) and diastolic (congestive) heart failure: Secondary | ICD-10-CM

## 2018-05-27 LAB — URINALYSIS, ROUTINE W REFLEX MICROSCOPIC
Bilirubin Urine: NEGATIVE
Glucose, UA: NEGATIVE mg/dL
Ketones, ur: NEGATIVE mg/dL
Leukocytes, UA: NEGATIVE
Nitrite: NEGATIVE
Protein, ur: >=300 mg/dL — AB
RBC / HPF: >50 RBC/hpf — ABNORMAL HIGH (ref 0–5)
Specific Gravity, Urine: 1.018 (ref 1.005–1.030)
pH: 5 (ref 5.0–8.0)

## 2018-05-27 LAB — GLUCOSE, CAPILLARY
Glucose-Capillary: 40 mg/dL — CL (ref 70–99)
Glucose-Capillary: 65 mg/dL — ABNORMAL LOW (ref 70–99)
Glucose-Capillary: 68 mg/dL — ABNORMAL LOW (ref 70–99)
Glucose-Capillary: 177 mg/dL — ABNORMAL HIGH (ref 70–99)
Glucose-Capillary: 118 mg/dL — ABNORMAL HIGH (ref 70–99)

## 2018-05-27 LAB — CBC
HCT: 26.6 % — ABNORMAL LOW (ref 39.0–52.0)
Hemoglobin: 8.6 g/dL — ABNORMAL LOW (ref 13.0–17.0)
MCH: 27.7 pg (ref 26.0–34.0)
MCHC: 32.3 g/dL (ref 30.0–36.0)
MCV: 85.5 fL (ref 80.0–100.0)
Platelets: 307 10*3/uL (ref 150–400)
RBC: 3.11 MIL/uL — AB (ref 4.22–5.81)
RDW: 13.5 % (ref 11.5–15.5)
WBC: 15.3 10*3/uL — ABNORMAL HIGH (ref 4.0–10.5)
nRBC: 0 % (ref 0.0–0.2)

## 2018-05-27 LAB — SEDIMENTATION RATE: Sed Rate: >140 mm/hr — ABNORMAL HIGH (ref 0–16)

## 2018-05-27 LAB — RENAL FUNCTION PANEL
Albumin: 1.9 g/dL — ABNORMAL LOW (ref 3.5–5.0)
Anion gap: 16 — ABNORMAL HIGH (ref 5–15)
BUN: 77 mg/dL — ABNORMAL HIGH (ref 6–20)
CO2: 21 mmol/L — ABNORMAL LOW (ref 22–32)
Calcium: 8.2 mg/dL — ABNORMAL LOW (ref 8.9–10.3)
Chloride: 97 mmol/L — ABNORMAL LOW (ref 98–111)
Creatinine, Ser: 4.92 mg/dL — ABNORMAL HIGH (ref 0.61–1.24)
GFR calc Af Amer: 14 mL/min — ABNORMAL LOW (ref >60)
GFR, EST NON AFRICAN AMERICAN: 12 mL/min — AB (ref >60)
Glucose, Bld: 69 mg/dL — ABNORMAL LOW (ref 70–99)
Phosphorus: 5 mg/dL — ABNORMAL HIGH (ref 2.5–4.6)
Potassium: 3.3 mmol/L — ABNORMAL LOW (ref 3.5–5.1)
Sodium: 134 mmol/L — ABNORMAL LOW (ref 135–145)

## 2018-05-27 LAB — HEPARIN LEVEL (UNFRACTIONATED): Heparin Unfractionated: 1.96 IU/mL — ABNORMAL HIGH (ref 0.30–0.70)

## 2018-05-27 MED ORDER — SENNOSIDES-DOCUSATE SODIUM 8.6-50 MG PO TABS
2.0000 | ORAL_TABLET | Freq: Two times a day (BID) | ORAL | Status: DC
  Administered 2018-05-28 – 2018-05-30 (×5): 2 via ORAL
  Filled 2018-05-27 (×6): qty 2

## 2018-05-27 MED ORDER — HEPARIN (PORCINE) 25000 UT/250ML-% IV SOLN: 1000.0000 [IU]/h | INTRAVENOUS | Status: DC

## 2018-05-27 MED ORDER — TECHNETIUM TO 99M ALBUMIN AGGREGATED
4.3000 | Freq: Once | INTRAVENOUS | Status: AC | PRN
  Administered 2018-05-27: 4.3 via INTRAVENOUS

## 2018-05-27 MED ORDER — TECHNETIUM TC 99M DIETHYLENETRIAME-PENTAACETIC ACID
32.3000 | Freq: Once | INTRAVENOUS | Status: AC | PRN
  Administered 2018-05-27: 32.3 via RESPIRATORY_TRACT

## 2018-05-27 MED ORDER — DARBEPOETIN ALFA 150 MCG/0.3ML IJ SOSY
150.0000 ug | PREFILLED_SYRINGE | INTRAMUSCULAR | Status: DC
  Administered 2018-05-28: 150 ug via SUBCUTANEOUS
  Filled 2018-05-27 (×2): qty 0.3

## 2018-05-27 NOTE — Progress Notes (Signed)
Hypoglycemic Event  CBG: 40  Treatment:  orange juice, pt was about to eat breakfast  Symptoms: unknown   Follow-up CBG: Time: 1013 CBG Result  65  Possible Reasons for Event: poor p.o. intake   Comments/MD notified:Dr. Denton Brick text paged , late call at [address]

## 2018-05-27 NOTE — Progress Notes (Signed)
Patient Demographics:    William [name], is a 58 y.o. male, DOB - 1961-03-30, YNW:[id number]  Admit date - 05/26/2018   Admitting Physician Elwyn Reach, MD  Outpatient Primary MD for the patient is [name], MD  LOS - 1   Chief Complaint  Patient presents with  . Shortness of Breath        Subjective:    William [name] today has no fevers, no emesis,  No chest pain, needs/family relative at bedside, questions answered, complains of shortness of breath,   Assessment  & Plan :    Principal Problem:   Acute respiratory failure with hypoxemia (HCC) Active Problems:   Type II diabetes mellitus with renal manifestations (HCC)   Hyperlipidemia   Tobacco use disorder   Hypertension associated with diabetes ([place])   Stage 3 chronic renal impairment associated with type 2 diabetes mellitus ([place])   Pulmonary edema  Brief summary 58 y.o. male w/ former smoker with presumed ICM (EF 30-35%), HTN, poorly controlled DM, HTN, prior CVA resulting in LE weakness, cognitive impairment (resides in SNF), CKD IV, mostly bedbound/wheelchair bound, recent NSTEMI admitted to 07/08/2018 with cough, shortness of breath and new onset hypoxia. VQ scan low probability for PE   Plan:- 1) new onset hypoxia/acute hypoxic respiratory failure--- ???  Etiology, clinically and by imaging findings no evidence of pneumonia, VQ scan with low possibility for PE,, clinically suspect CHF exacerbation, stop IV heparin given VQ scan findings, only on 3 L of oxygen via nasal cannula  2)HFrEF--- patient with history of combined diastolic and systolic dysfunction CHF with presumed ICM, last known EF 30 to 35 %, BNP is 480, troponin 0 0.28 cardiology consult appreciated, plan nephrology and cardiology services okay to treat with IV Lasix 40 mg every 12 hours, continue Coreg 12.5 mg twice daily and hydralazine 50 mg 3 times daily/  Imdur 60 mg daily  3)H/o CVA--- patient has residual cognitive deficits as well as difficulty with mobility related activities of daily living mostly bedbound/wheelchair bound, lives at SNF  4)AKI----acute kidney injury on CKD stage -     creatinine on admission=4.77 (up from 3.58 days prior to admission),   baseline creatinine =    , creatinine is now=      , renally adjust medications, avoid nephrotoxic agents/dehydration/hypotension, allergy consult appreciated, renal ultrasound and Foley ordered by nephrology service, urine output is poor  5) anemia of chronic kidney disease--- ESA/Aranesp per nephrology service, evidence of ongoing bleeding  6) tobacco abuse--- smoking cessation advised, okay to use nicotine patch  7)DM2--A1c 6.3 reflecting poor diabetic control, continue Lantus insulin to 62 units nightly along with sliding scale coverage, continue Tradjenta 5 mg daily  8)H/o CAD/ICM--- patient had an STEMI in January 2020, continue aspirin 81 mg daily, Lipitor 80 mg every afternoon, Coreg 12.5 mg twice daily and Plavix 75 mg daily, continue isosorbide 60 mg daily  Disposition/Need for in-Hospital Stay- patient unable to be discharged at this time due to new onset hypoxia requiring further work-up, and treatment for CHF  Code Status : DNR  Family Communication:   Niece at bedside   Disposition Plan  : SNF  Consults  :  Cardiology/Nephrology  DVT Prophylaxis  :   x -  Heparin -   Lab Results  Component Value Date   PLT 307 05/27/2018    Inpatient Medications  Scheduled Meds: . aspirin  81 mg Oral Daily  . atorvastatin  80 mg Oral q1800  . buPROPion  150 mg Oral BID  . carvedilol  12.5 mg Oral BID WC  . clopidogrel  75 mg Oral Daily  . darbepoetin (ARANESP) injection - NON-DIALYSIS  150 mcg Subcutaneous Q Fri-1800  . escitalopram  10 mg Oral Daily  . feeding supplement (GLUCERNA SHAKE)  237 mL Oral BID  . ferrous sulfate  325 mg Oral Q breakfast  . furosemide  40 mg  Intravenous BID  . hydrALAZINE  50 mg Oral Q8H  . insulin aspart  0-5 Units Subcutaneous QHS  . insulin aspart  0-9 Units Subcutaneous TID WC  . insulin glargine  62 Units Subcutaneous QHS  . isosorbide mononitrate  60 mg Oral Daily  . linagliptin  5 mg Oral Daily  . nicotine  21 mg Transdermal Daily  . pantoprazole  40 mg Oral Daily  . polyvinyl alcohol  2 drop Both Eyes BID  . sodium chloride flush  3 mL Intravenous Q12H   Continuous Infusions: . sodium chloride    . heparin 1,000 Units/hr (05/27/18 1031)   PRN Meds:.sodium chloride, acetaminophen **OR** acetaminophen, acetaminophen, calcium carbonate (dosed in mg elemental calcium), camphor-menthol **AND** hydrOXYzine, docusate sodium, feeding supplement (NEPRO CARB STEADY), hydrOXYzine, ondansetron **OR** ondansetron (ZOFRAN) IV, simethicone, sodium chloride flush, sorbitol, zolpidem    Anti-infectives (From admission, onward)   None        Objective:   Vitals:   05/27/18 0604 05/27/18 0606 05/27/18 1009 05/27/18 1500  BP: (!) 141/73 (!) 141/73    Pulse:  (!) 40    Resp:  (!) 22    Temp:  98.6 F (37 C) 98.6 F (37 C) (!) 97.5 F (36.4 C)  TempSrc:  Oral Oral Oral  SpO2:  100%    Weight:      Height:  5\' 8"  (1.727 m)      Wt Readings from Last 3 Encounters:  05/26/18 78.2 kg  05/17/18 78.2 kg  02/04/18 80.3 kg     Intake/Output Summary (Last 24 hours) at 05/27/2018 1610 Last data filed at 05/27/2018 1100 Gross per 24 hour  Intake 228.4 ml  Output 300 ml  Net -71.6 ml     Physical Exam Patient is examined daily including today on 05/27/18 , exams remain the same as of yesterday except that has changed   Gen:- Awake Alert, in no acute distress HEENT:- [place].AT, No sclera icterus Nose- [place] 3 L/min Neck-Supple Neck. Lungs-diminished in bases with bibasilar rales CV- S1, S2 normal, regular  Abd-  +ve B.Sounds, Abd Soft, No tenderness,    Extremity/Skin:- +ve edema, pedal pulses present  Psych-affect is flat,  some cognitive deficits Neuro-left-sided hemiparesis  GU--- Foley with dark urine   Data Review:   Micro Results No results found for this or any previous visit (from the past 240 hour(s)).  Radiology Reports Ct Chest Wo Contrast  Result Date: 05/11/2018 CLINICAL DATA:  58 year old male with hypoxia. EXAM: CT CHEST WITHOUT CONTRAST TECHNIQUE: Multidetector CT imaging of the chest was performed following the standard protocol without IV contrast. COMPARISON:  Chest radiograph dated 05/11/2018 FINDINGS: Evaluation of this exam is limited in the absence of intravenous contrast. Cardiovascular: There is mild cardiomegaly. No significant pericardial effusion. There is multi vessel coronary vascular calcification. There is hypoattenuation of the  cardiac blood pool suggestive of a degree of anemia. Clinical correlation is recommended. There is moderate atherosclerotic calcification of the thoracic aorta. The central pulmonary arteries are grossly unremarkable. Mediastinum/Nodes: Top-normal paratracheal lymph nodes measure up to 10 mm in short axis. Subcarinal lymph node measures 17 mm in short axis. The esophagus is grossly unremarkable. No mediastinal fluid collection. Lungs/Pleura: There are small bilateral pleural effusions. There is diffuse interstitial and interlobular septal prominence with Kerley B-lines as well as patchy areas of central and perihilar ground-glass density. Findings most consistent with pulmonary edema with probable superimposed pneumonia. Clinical correlation is recommended. There is no pneumothorax. The central airways are patent. Upper Abdomen: There are stones within the gallbladder. There is slight irregularity of the liver contour which may represent early changes of cirrhosis. Clinical correlation is recommended. Musculoskeletal: Degenerative changes of the spine. No acute osseous pathology. IMPRESSION: 1. Findings most consistent with pulmonary edema with probable superimposed  pneumonia. Clinical correlation is recommended. 2. Small bilateral pleural effusions. 3. Cholelithiasis. Electronically Signed   By: Anner Crete M.D.   On: 05/11/2018 19:59   US Renal  Result Date: 05/12/2018 CLINICAL DATA:  Renal injury. EXAM: RENAL / URINARY TRACT ULTRASOUND COMPLETE COMPARISON:  No prior. FINDINGS: Right Kidney: Renal measurements: 10.7 x 5.7 x 5.3 cm = volume: 168.2 mL. Increased echogenicity. No mass or hydronephrosis visualized. Left Kidney: Renal measurements: 10.6 x 7.0 x 6.7 cm = volume: 258.0 mL. Increased echogenicity. No mass or hydronephrosis visualized. Bladder: Foley catheter in the bladder. Right pleural effusion incidentally noted. IMPRESSION: 1. Increased renal echogenicity noted bilaterally. These findings consistent chronic medical renal disease. No acute abnormality identified. No hydronephrosis or bladder distention. 2.  Right pleural effusion is incidentally noted. Electronically Signed   By: Marcello Moores  Register   On: 05/12/2018 11:40   Nm Pulmonary Perf And Vent  Result Date: 05/27/2018 CLINICAL DATA:  Respiratory failure EXAM: NUCLEAR MEDICINE VENTILATION - PERFUSION LUNG SCAN VIEWS: Anterior, posterior, left lateral, right lateral, RPO, LPO, RAO, LAO-ventilation and perfusion RADIOPHARMACEUTICALS:  32.3 mCi of Tc-74m DTPA aerosol inhalation and 4.30 mCi Tc72m MAA IV COMPARISON:  Chest radiograph May 26, 2018 FINDINGS: Ventilation: Radiotracer uptake is homogeneous and symmetric bilaterally. No ventilation defects evident. Perfusion: Radiotracer uptake is homogeneous and symmetric bilaterally. No perfusion defects evident. IMPRESSION: No appreciable ventilation or perfusion defects. Very low probability of pulmonary embolus. Electronically Signed   By: Lowella Grip III M.D.   On: 05/27/2018 13:17   Dg Chest Port 1 View  Result Date: 05/26/2018 CLINICAL DATA:  Shortness of breath. EXAM: PORTABLE CHEST 1 VIEW COMPARISON:  05/13/2018. FINDINGS: Mildly  enlarged cardiac silhouette with a mild increase in size. Resolved bilateral airspace opacity. Residual interstitial prominence with [employer]. No visible pleural fluid. The right lateral costophrenic angle is not included. The lungs are mildly hyperexpanded. Thoracic spine degenerative changes. IMPRESSION: Improving changes of congestive heart failure superimposed on COPD with residual interstitial pulmonary edema. Electronically Signed   By: Claudie Revering M.D.   On: 05/26/2018 20:26   Dg Chest Port 1 View  Result Date: 05/13/2018 CLINICAL DATA:  Pulmonary edema short of breath EXAM: PORTABLE CHEST 1 VIEW COMPARISON:  05/12/2018 FINDINGS: Progression of by lateral airspace disease which is perihilar in location. No effusion. Heart size upper normal. IMPRESSION: Progression of bilateral airspace disease compatible with edema. Electronically Signed   By: Franchot Gallo M.D.   On: 05/13/2018 07:48   Dg Chest Port 1 View  Result Date: 05/12/2018 CLINICAL DATA:  Shortness of breath.  Respiratory failure. EXAM: PORTABLE CHEST 1 VIEW COMPARISON:  May 11, 2018 FINDINGS: No pneumothorax. Increasing bilateral pulmonary opacities, particularly in the bases. Diffuse interstitial prominence. The heart size is borderline. No other interval changes. IMPRESSION: 1. Worsening of bilateral pulmonary opacities, suggested to represent pneumonia superimposed on a background of edema on the CT scan from May 11, 2018. Recommend clinical correlation and follow-up to resolution. Electronically Signed   By: Dorise Bullion III M.D   On: 05/12/2018 08:34   Dg Chest Port 1 View  Result Date: 05/11/2018 CLINICAL DATA:  Shortness of breath, weakness, and vomiting. EXAM: PORTABLE CHEST 1 VIEW COMPARISON:  05/25/2017 FINDINGS: Mild cardiomegaly. Moderate diffuse interstitial infiltrates with Kerley B-lines, consistent with diffuse interstitial edema. No evidence of focal consolidation or significant pleural effusion.  IMPRESSION: Cardiomegaly and diffuse interstitial edema, consistent with congestive heart failure. Electronically Signed   By: Earle Gell M.D.   On: 05/11/2018 18:47   Vas [place] Lower Extremity Venous (dvt)  Result Date: 05/12/2018  Lower Venous Study Indications: SOB.  Performing Technologist: Toma Copier RVS  Examination Guidelines: A complete evaluation includes B-mode imaging, spectral Doppler, color Doppler, and power Doppler as needed of all accessible portions of each vessel. Bilateral testing is considered an integral part of a complete examination. Limited examinations for reoccurring indications may be performed as noted.  Right Venous Findings: +---------+---------------+---------+-----------+----------+-------+          CompressibilityPhasicitySpontaneityPropertiesSummary +---------+---------------+---------+-----------+----------+-------+ CFV      Full           Yes      Yes                          +---------+---------------+---------+-----------+----------+-------+ SFJ      Full                                                 +---------+---------------+---------+-----------+----------+-------+ FV Prox  Full           Yes      Yes                          +---------+---------------+---------+-----------+----------+-------+ FV Mid   Full                                                 +---------+---------------+---------+-----------+----------+-------+ FV DistalFull           Yes      Yes                          +---------+---------------+---------+-----------+----------+-------+ PFV      Full           Yes      Yes                          +---------+---------------+---------+-----------+----------+-------+ POP      Full           Yes      Yes                          +---------+---------------+---------+-----------+----------+-------+  PTV      Full                                                  +---------+---------------+---------+-----------+----------+-------+ PERO     Full                                                 +---------+---------------+---------+-----------+----------+-------+ Mild technical difficulty due to involuntary jerking of the legs  Left Venous Findings: +---------+---------------+---------+-----------+----------+-------+          CompressibilityPhasicitySpontaneityPropertiesSummary +---------+---------------+---------+-----------+----------+-------+ CFV      Full           Yes      Yes                          +---------+---------------+---------+-----------+----------+-------+ SFJ      Full                                                 +---------+---------------+---------+-----------+----------+-------+ FV Prox  Full           Yes      Yes                          +---------+---------------+---------+-----------+----------+-------+ FV Mid   Full                                                 +---------+---------------+---------+-----------+----------+-------+ FV DistalFull           Yes      Yes                          +---------+---------------+---------+-----------+----------+-------+ PFV      Full           Yes      Yes                          +---------+---------------+---------+-----------+----------+-------+ POP      Full           Yes      Yes                          +---------+---------------+---------+-----------+----------+-------+ PTV      Full                                                 +---------+---------------+---------+-----------+----------+-------+ PERO     Full                                                 +---------+---------------+---------+-----------+----------+-------+  Mild technical difficulty due to involuntary jerking of the leg    Summary: Right: There is no evidence of deep vein thrombosis in the lower extremity. No cystic structure found in the popliteal fossa.  Left: There is no evidence of deep vein thrombosis in the lower extremity. No cystic structure found in the popliteal fossa.  *See table(s) above for measurements and observations. Electronically signed by Monica Martinez MD on 05/12/2018 at 7:25:49 PM.    Final      CBC Recent Labs  Lab 05/26/18 1947 05/27/18 0621  WBC 13.0* 15.3*  HGB 8.6* 8.6*  HCT 27.4* 26.6*  PLT 328 307  MCV 87.8 85.5  MCH 27.6 27.7  MCHC 31.4 32.3  RDW 13.6 13.5  LYMPHSABS 1.1  --   MONOABS 1.1*  --   EOSABS 0.2  --   BASOSABS 0.1  --     Chemistries  Recent Labs  Lab 05/26/18 1947 05/27/18 0621  NA 134* 134*  K 3.6 3.3*  CL 98 97*  CO2 22 21*  GLUCOSE 177* 69*  BUN 74* 77*  CREATININE 4.77* 4.92*  CALCIUM 7.9* 8.2*  AST 21  --   ALT 22  --   ALKPHOS 72  --   BILITOT 0.7  --    ------------------------------------------------------------------------------------------------------------------ No results for input(s): CHOL, HDL, LDLCALC, TRIG, CHOLHDL, LDLDIRECT in the last 72 hours.  Lab Results  Component Value Date   HGBA1C 8.3 (H) 05/12/2018   ------------------------------------------------------------------------------------------------------------------ No results for input(s): TSH, T4TOTAL, T3FREE, THYROIDAB in the last 72 hours.  Invalid input(s): FREET3 ------------------------------------------------------------------------------------------------------------------ No results for input(s): VITAMINB12, FOLATE, FERRITIN, TIBC, IRON, RETICCTPCT in the last 72 hours.  Coagulation profile No results for input(s): INR, PROTIME in the last 168 hours.  Recent Labs    05/26/18 1947  DDIMER 6.63*    Cardiac Enzymes No results for input(s): CKMB, TROPONINI, MYOGLOBIN in the last 168 hours.  Invalid input(s): CK ------------------------------------------------------------------------------------------------------------------    Component Value Date/Time   BNP 490.8 (H)  05/26/2018 1948     Roxan Hockey M.D on 05/27/2018 at 4:10 PM  Go to [email or web address] - for contact info  [facility] - Office  [phone]

## 2018-05-27 NOTE — Evaluation (Signed)
Physical Therapy Evaluation Patient Details Name: William [name] MRN: [id number] DOB: 17-Apr-1961 Today's Date: 05/27/2018   History of Present Illness  Pt is a 58 y.o. male who resides at [facility] SNF admitted 05/26/18 with hypoxia and SOB. Worked up for recurrent acute hypoxic respiratory failure concerning for multifactorial process given progressive renal failure. PMH includes CVA (residual weakness and cognitive deficits), CAD, CKD, DM, HTN. Of note, recent admission 04/2018 with NSTEMI.    Clinical Impression  Pt presents with an overall decrease in functional mobility secondary to above. PTA, pt resides at [facility]; requires assist for ADLs and transfers to/from wheelchair, able to self-propel w/c. Today, pt required mod-maxA for transfer to recliner; pt unable to initiate steps upon standing, suspect this related to residual deficits from CVA and fear of falling. Pt would benefit from continued acute PT services to maximize functional mobility and independence prior to d/c to SNF.     Follow Up Recommendations SNF;Supervision for mobility/OOB    Equipment Recommendations       Recommendations for Other Services       Precautions / Restrictions Precautions Precautions: Fall Precaution Comments: hx of CVA, learning disability  Restrictions Weight Bearing Restrictions: No      Mobility  Bed Mobility Overal bed mobility: Needs Assistance Bed Mobility: Supine to Sit;Sit to Supine     Supine to sit: Mod assist Sit to supine: Supervision   General bed mobility comments: ModA for UE support to assist trunk elevation; able to return to supine with supervision  Transfers Overall transfer level: Needs assistance Equipment used: 1 person hand held assist Transfers: Sit to/from [employer] Sit to Stand: Mod assist   Squat pivot transfers: Max assist     General transfer comment: ModA to assist trunk elevation with HHA; pt unable to achieve  fully upright, difficulty intiating weight shifts/steps, eventually requiring maxA to squat pivot to recliner  Ambulation/Gait             General Gait Details: Does not walk at SNF. Attempted steps, but pt unable to intiate weight shifts/steps despite max, multimodal cues and physical assist to move LE; BUE support and maxA. Endorses fear of falling  Financial trader Rank[employer] (Stroke Patients Only)       Balance Overall balance assessment: Needs assistance Sitting-balance support: Feet supported;No upper extremity supported Sitting balance-Leahy Scale: Fair     Standing balance support: During functional activity Standing balance-Leahy Scale: Poor Standing balance comment: external support for partial standing; not able to get fully upright                             Pertinent Vitals/Pain Pain Assessment: No/denies pain    Home Living Family/patient expects to be discharged to:: Skilled nursing facility                 Additional Comments: Resident at [facility] SNF    Prior Function Level of Independence: Needs assistance   Gait / Transfers Assistance Needed: Reports not walking PTA, w/c bound, requires assist for transfers. Able to slowly self-propel w/c with BUEs/BLEs  ADL's / Homemaking Assistance Needed: Assist from staff for all ADLs        Hand Dominance        Extremity/Trunk Assessment   Upper Extremity Assessment Upper Extremity Assessment: Generalized weakness;LUE deficits/detail LUE Deficits /  Details: Residual deficits from CVA    Lower Extremity Assessment Lower Extremity Assessment: RLE deficits/detail;LLE deficits/detail RLE Deficits / Details: Residual deficits from CVA RLE Coordination: decreased gross motor;decreased fine motor LLE Deficits / Details: Residual deficits from CVA LLE Coordination: decreased fine motor;decreased gross motor       Communication    Communication: No difficulties  Cognition Arousal/Alertness: Awake/alert Behavior During Therapy: WFL for tasks assessed/performed Overall Cognitive Status: History of cognitive impairments - at baseline Area of Impairment: Attention;Following commands;Problem solving                   Current Attention Level: Selective   Following Commands: Follows one step commands with increased time;Follows multi-step commands inconsistently     Problem Solving: Slow processing;Requires verbal cues;Decreased initiation;Difficulty sequencing;Requires tactile cues        General Comments      Exercises     Assessment/Plan    PT Assessment Patient needs continued PT services  PT Problem List Decreased strength;Decreased mobility;Decreased safety awareness;Decreased activity tolerance;Decreased cognition;Decreased balance;Decreased knowledge of use of DME;Impaired sensation       PT Treatment Interventions Functional mobility training;Balance training;Patient/family education;Gait training;Therapeutic activities;Therapeutic exercise;DME instruction;Neuromuscular re-education;Stair training    PT Goals (Current goals can be found in the Care Plan section)  Acute Rehab PT Goals Patient Stated Goal: Stop itching PT Goal Formulation: With patient Time For Goal Achievement: 06/10/18 Potential to Achieve Goals: Good    Frequency Min 2X/week   Barriers to discharge        Co-evaluation               AM-PAC PT "6 Clicks" Mobility  Outcome Measure Help needed turning from your back to your side while in a flat bed without using bedrails?: A Little Help needed moving from lying on your back to sitting on the side of a flat bed without using bedrails?: A Lot Help needed moving to and from a bed to a chair (including a wheelchair)?: A Lot Help needed standing up from a chair using your arms (e.g., wheelchair or bedside chair)?: A Lot Help needed to walk in hospital room?:  Total Help needed climbing 3-5 steps with a railing? : Total 6 Click Score: 11    End of Session Equipment Utilized During Treatment: Gait belt Activity Tolerance: Patient tolerated treatment well;Patient limited by fatigue Patient left: in chair;with call bell/phone within reach;with chair alarm set Nurse Communication: Mobility status PT Visit Diagnosis: Muscle weakness (generalized) (M62.81);Unsteadiness on feet (R26.81);Difficulty in walking, not elsewhere classified (R26.2)    Time: 5053-9767 PT Time Calculation (min) (ACUTE ONLY): 25 min   Charges:   PT Evaluation $PT Eval Moderate Complexity: 1 Mod PT Treatments $Therapeutic Activity: 8-22 mins      Mabeline Caras, PT, DPT Acute Rehabilitation Services  Pager [phone] Office [place] 05/27/2018, 2:42 PM

## 2018-05-27 NOTE — Consult Note (Signed)
[facility] Renal Consultation Note  Requesting MD: Jonelle Sidle Indication for Consultation: worsening CKD  HPI:  William [name] is a 58 y.o. male longstanding SNF resident mostly bedbound and wheelchair using due to CVAs in past, intellectual disability, DM, HTN and recent NSTEMI ([id number]) with found cardiomyopathy EF 30-35%, DNR.  Regardig kidney function crt seemed to be in the mid to high 1's in 2019, then when hospitalized for NSTEMI- crt was in the high 2's to 3's- seemed to progress while here in the setting of diuresis, nearly 5 liters.  Renal U/S at the time showed 10 cm kidneys with increased echogenicity- urinalysis showed 100 of protein and 20-50 RBC which was different from May2019- no RBC.  Renal was not involved during last hospitalization. He was sent out on coreg and hydralazine.  He was sent back to SNF but returned last night with c/o SOB.  CXR does show some edema but improved from last hosp- work up for PE pending and he is on heparin.  Crt is now 4.77 (from 3.5 8 days ago) BP does not seem low- mostly 140's although one recording of 79 (? validity)  No UOP recorded - BNP 480 and troponin 0.28.  Very simple- one word answers SOB , also says is nauseated for 2 days.  Says knows what dialysis is, but when I asked him to tell me he said "I dont"    Creatinine, Ser  Date/Time Value Ref Range Status  05/26/2018 07:47 PM 4.77 (H) 0.61 - 1.24 mg/dL Final  05/18/2018 07:09 AM 3.54 (H) 0.61 - 1.24 mg/dL Final  05/17/2018 09:13 AM 3.71 (H) 0.61 - 1.24 mg/dL Final  05/16/2018 03:42 AM 3.54 (H) 0.61 - 1.24 mg/dL Final  05/15/2018 03:51 AM 3.19 (H) 0.61 - 1.24 mg/dL Final  05/14/2018 03:56 AM 3.11 (H) 0.61 - 1.24 mg/dL Final  05/13/2018 02:25 AM 3.08 (H) 0.61 - 1.24 mg/dL Final  05/12/2018 02:32 PM 3.22 (H) 0.61 - 1.24 mg/dL Final  05/12/2018 03:23 AM 2.83 (H) 0.61 - 1.24 mg/dL Final  05/11/2018 06:20 PM 2.82 (H) 0.61 - 1.24 mg/dL Final  08/23/2017 04:07 AM 1.56 (H) 0.61 -  1.24 mg/dL Final  08/19/2017 03:19 AM 1.86 (H) 0.61 - 1.24 mg/dL Final  08/18/2017 07:17 PM 1.71 (H) 0.61 - 1.24 mg/dL Final  05/25/2017 04:12 PM 1.62 (H) 0.40 - 1.50 mg/dL Final  11/03/2016 10:45 AM 1.37 0.40 - 1.50 mg/dL Final  06/09/2016 10:04 AM 1.34 0.40 - 1.50 mg/dL Final  02/06/2016 11:17 AM 1.31 0.40 - 1.50 mg/dL Final  10/09/2015 11:04 AM 1.51 (H) 0.40 - 1.50 mg/dL Final  06/11/2015 10:48 AM 1.55 (H) 0.40 - 1.50 mg/dL Final  03/11/2015 09:38 AM 1.31 0.40 - 1.50 mg/dL Final  04/30/2014 09:45 AM 1.3 0.4 - 1.5 mg/dL Final  09/13/2013 09:14 AM 1.1 0.4 - 1.5 mg/dL Final  07/12/2013 10:01 AM 1.3 0.4 - 1.5 mg/dL Final  03/14/2013 09:03 AM 1.5 0.4 - 1.5 mg/dL Final  12/30/2011 08:19 AM 1.3 0.4 - 1.5 mg/dL Final  06/29/2011 09:21 AM 1.2 0.4 - 1.5 mg/dL Final  10/30/2010 08:28 AM 1.2 0.4 - 1.5 mg/dL Final  09/19/2010 09:44 AM 1.07 0.4 - 1.5 mg/dL Final  05/27/2010 08:13 AM 1.0 0.4 - 1.5 mg/dL Final  01/21/2010 08:42 AM 1.1 0.4 - 1.5 mg/dL Final  07/22/2009 08:41 AM 0.9 0.4 - 1.5 mg/dL Final  04/29/2009 09:21 AM 1.2 0.4 - 1.5 mg/dL Final  05/30/2008 11:29 AM 1.0 0.4 - 1.5 mg/dL Final  03/01/2008 08:33 AM 1.0 0.4 - 1.5 mg/dL Final  11/17/2007 09:49 AM 1.0 0.4 - 1.5 mg/dL Final  08/17/2007 03:50 PM 1.1 0.4 - 1.5 mg/dL Final  05/23/2007 11:39 AM 1.2 0.4 - 1.5 mg/dL Final  05/27/2006 01:33 PM 1.0 0.4 - 1.5 mg/dL Final  02/04/2006 12:50 PM 1.1 0.4 - 1.5 mg/dL Final     PMHx:   Past Medical History:  Diagnosis Date  . CEREBROVASCULAR ACCIDENT, HX OF 09/28/2006   2007 affected left side and peripheral vision  . DIABETES MELLITUS, TYPE II 09/28/2006  . GERD (gastroesophageal reflux disease)   . HYPERLIPIDEMIA 09/28/2006  . HYPERTENSION 09/28/2006  . Learning disability   . Stroke [facility]) years ago    Past Surgical History:  Procedure Laterality Date  . COLONOSCOPY    . LYMPHADENECTOMY    . RHINOPLASTY    . SIGMOIDOSCOPY      Family Hx:  Family History  Problem Relation Age of  Onset  . Lung cancer Mother   . Diabetes Father   . Stroke Father   . Alcohol abuse Brother   . Diabetes Brother   . Heart attack Sister   . Colon cancer Neg Hx     Social History:  reports that he has been smoking cigarettes. He has been smoking about 1.00 pack per day. He has quit using smokeless tobacco. He reports that he does not drink alcohol or use drugs.  Allergies:  Allergies  Allergen Reactions  . Sulfamethoxazole Rash    Medications: Prior to Admission medications   Medication Sig Start Date End Date Taking? Authorizing Provider  acetaminophen (TYLENOL) 325 MG tablet Take 650 mg by mouth every 6 (six) hours as needed for fever.   Yes [provider]  aspirin 81 MG chewable tablet Chew 1 tablet (81 mg total) by mouth daily. 05/19/18  Yes Kathyrn Drown D, NP  atorvastatin (LIPITOR) 80 MG tablet Take 1 tablet (80 mg total) by mouth daily at 6 PM. 05/18/18  Yes Kathyrn Drown D, NP  buPROPion [facility] SR) 150 MG 12 hr tablet Take 150 mg by mouth 2 (two) times daily.   Yes [provider]  carvedilol (COREG) 12.5 MG tablet Take 1 tablet (12.5 mg total) by mouth 2 (two) times daily with a meal. 05/18/18  Yes Kathyrn Drown D, NP  clopidogrel (PLAVIX) 75 MG tablet Take 1 tablet (75 mg total) by mouth daily. 05/19/18  Yes Kathyrn Drown D, NP  escitalopram (LEXAPRO) 10 MG tablet Take 10 mg by mouth daily.   Yes [provider]  ferrous sulfate 325 (65 FE) MG tablet Take 1 tablet (325 mg total) by mouth 2 (two) times daily with a meal. Patient taking differently: Take 325 mg by mouth daily with breakfast.  03/24/17  Yes Marin Olp, MD  GLUCERNA [facility]) LIQD Take 237 mLs by mouth 2 (two) times daily.   Yes [provider]  hydrALAZINE (APRESOLINE) 50 MG tablet Take 1 tablet (50 mg total) by mouth every 8 (eight) hours. 05/18/18  Yes Kathyrn Drown D, NP  hydrOXYzine (ATARAX/VISTARIL) 25 MG tablet Take 1 tablet (25 mg total) by mouth every 6  (six) hours as needed for itching. 05/07/18  Yes Street, [place], PA-C  Hypromellose (ARTIFICIAL TEARS) 0.4 % SOLN Place 2 drops into both eyes 2 (two) times daily.    Yes [provider]  insulin aspart (NOVOLOG PENFILL) cartridge Inject 20-24 Units into the skin 3 (three) times daily before meals. 20 units in the  morning. 24 units in the afternoon. 20 units in the evening 11/24/17  Yes [provider]  Insulin Glargine (LANTUS SOLOSTAR) 100 UNIT/ML Solostar Pen Inject 62 Units into the skin at bedtime.  11/17/17  Yes [provider]  isosorbide mononitrate (IMDUR) 60 MG 24 hr tablet Take 1 tablet (60 mg total) by mouth daily. 05/19/18  Yes Kathyrn Drown D, NP  linagliptin (TRADJENTA) 5 MG TABS tablet Take 5 mg by mouth daily. 09/27/17  Yes [provider]  pantoprazole (PROTONIX) 40 MG tablet Take 1 tablet (40 mg total) by mouth daily. 05/19/18  Yes Kathyrn Drown D, NP  simethicone (MYLICON) 80 MG chewable tablet Chew 160 mg by mouth 3 (three) times daily as needed for flatulence.   Yes [provider]    I have reviewed the patient's current medications.  Labs:  Results for orders placed or performed during the hospital encounter of 05/26/18 (from the past 48 hour(s))  Comprehensive metabolic panel     Status: Abnormal   Collection Time: 05/26/18  7:47 PM  Result Value Ref Range   Sodium 134 (L) 135 - 145 mmol/L   Potassium 3.6 3.5 - 5.1 mmol/L   Chloride 98 98 - 111 mmol/L   CO2 22 22 - 32 mmol/L   Glucose, Bld 177 (H) 70 - 99 mg/dL   BUN 74 (H) 6 - 20 mg/dL   Creatinine, Ser 4.77 (H) 0.61 - 1.24 mg/dL   Calcium 7.9 (L) 8.9 - 10.3 mg/dL   Total Protein 7.1 6.5 - 8.1 g/dL   Albumin 2.0 (L) 3.5 - 5.0 g/dL   AST 21 15 - 41 U/L   ALT 22 0 - 44 U/L   Alkaline Phosphatase 72 38 - 126 U/L   Total Bilirubin 0.7 0.3 - 1.2 mg/dL   GFR calc non Af Amer 12 (L) >60 mL/min   GFR calc Af Amer 14 (L) >60 mL/min   Anion gap 14 5 - 15    Comment: Performed  at [facility], [address], [place], [place] [address]  CBC WITH DIFFERENTIAL     Status: Abnormal   Collection Time: 05/26/18  7:47 PM  Result Value Ref Range   WBC 13.0 (H) 4.0 - 10.5 K/uL   RBC 3.12 (L) 4.22 - 5.81 MIL/uL   Hemoglobin 8.6 (L) 13.0 - 17.0 g/dL   HCT 27.4 (L) 39.0 - 52.0 %   MCV 87.8 80.0 - 100.0 fL   MCH 27.6 26.0 - 34.0 pg   MCHC 31.4 30.0 - 36.0 g/dL   RDW 13.6 11.5 - 15.5 %   Platelets 328 150 - 400 K/uL   nRBC 0.0 0.0 - 0.2 %   Neutrophils Relative % 82 %   Neutro Abs 10.5 (H) 1.7 - 7.7 K/uL   Lymphocytes Relative 8 %   Lymphs Abs 1.1 0.7 - 4.0 K/uL   Monocytes Relative 8 %   Monocytes Absolute 1.1 (H) 0.1 - 1.0 K/uL   Eosinophils Relative 1 %   Eosinophils Absolute 0.2 0.0 - 0.5 K/uL   Basophils Relative 0 %   Basophils Absolute 0.1 0.0 - 0.1 K/uL   Immature Granulocytes 1 %   Abs Immature Granulocytes 0.11 (H) 0.00 - 0.07 K/uL    Comment: Performed at [facility], [address], [place], [place] [address]  D-dimer, quantitative (not at [facility])     Status: Abnormal   Collection Time: 05/26/18  7:47 PM  Result Value Ref Range  D-Dimer, Quant 6.63 (H) 0.00 - 0.50 ug/mL-FEU    Comment: (NOTE) At the manufacturer cut-off of 0.50 ug/mL FEU, this assay has been documented to exclude PE with a sensitivity and negative predictive value of 97 to 99%.  At this time, this assay has not been approved by the FDA to exclude DVT/VTE. Results should be correlated with clinical presentation. Performed at [facility], [place] [address] [place], [place] [address]   Brain natriuretic peptide     Status: Abnormal   Collection Time: 05/26/18  7:48 PM  Result Value Ref Range   B Natriuretic Peptide 490.8 (H) 0.0 - 100.0 pg/mL    Comment: Performed at [facility] [address], [place], [place] [address]  I-stat troponin, ED  (not at [facility], [facility])     Status: Abnormal   Collection Time: 05/26/18  8:08 PM  Result Value Ref Range   Troponin  i, poc 0.28 (HH) 0.00 - 0.08 ng/mL   Comment NOTIFIED PHYSICIAN    Comment 3            Comment: Due to the release kinetics of cTnI, a negative result within the first hours of the onset of symptoms does not rule out myocardial infarction with certainty. If myocardial infarction is still suspected, repeat the test at appropriate intervals.   Glucose, capillary     Status: Abnormal   Collection Time: 05/26/18 11:43 PM  Result Value Ref Range   Glucose-Capillary 128 (H) 70 - 99 mg/dL     ROS:  A comprehensive review of systems was negative except for: Respiratory: positive for SOB Gastrointestinal: positive for nausea  Physical Exam: Vitals:   05/27/18 0604 05/27/18 0606  BP: (!) 141/73 (!) 141/73  Pulse:  (!) 40  Resp:  (!) 22  Temp:  98.6 F (37 C)  SpO2:  100%     General: alert when aroused- one word answers- seems to understand simple questions- multiple skin lesions on extremities HEENT: PERRLA, EOMI, mucous membranes moist Neck: no JVD Heart: RRR Lungs: CBS bilat- poor effort Abdomen: soft, non tender- bladder not palp Extremities: multiple skin abrasions- no periph edema Skin: dry Neuro: alert, simple  Foley with around 300 ccs of dark urine  Assessment/Plan: 58 year old WM with multiple medical issues as outlined above with subacute progression of renal disease over the last year 1.Renal- progression of renal disease in the last year from 1.6 now to 4.7.  Last month U/A did show hematuria which was new- proteinuria had stayed around 100 mg/dL not that impressive.  Will send off another U/A and check another renal U/S- place foley.  Possibility of cardiorenal syndrome although does not seem that BP has been low- did not get cardiac cath or other contrast, I cannot elicit NSAID use. Progression too quick for just run ordinary HTN or DM.  ? Some type of pulmonary renal syndrome ? Will check serologies.   Not sure it would be worthwhile to pursue a renal biopsy as  cat  may be out of the bag.  There are no acute indications for dialysis and I am not sure this patient can understand the procedure and reasons for it.  Will need to have discussions with family before embarking on that as a chronic treatment.  For now look for reversibility  2. Hypertension/volume  - BP is not low.  He does not appear that volume overloaded but CXR does show some fluid.  I am Ok with lasix  40 I q 12 hours for now.  Can also continue coreg and hydralazine unless BP gets low 3. SOB- thought is PE vs CHF- on heparin- VQ being planned.   Gentle diuresis.  Sat is good on 3 liters- anemia is likely not helping  4. Anemia  - has been steadily dropping, likely related to CKD- will add ESA and check iron stores    Louis Meckel 05/27/2018, 6:46 AM

## 2018-05-27 NOTE — Progress Notes (Signed)
Inpatient Diabetes Program Recommendations  AACE/ADA: New Consensus Statement on Inpatient Glycemic Control (2015)  Target Ranges:  Prepandial:   less than 140 mg/dL      Peak postprandial:   less than 180 mg/dL (1-2 hours)      Critically ill patients:  140 - 180 mg/dL   Results for CREG, GILMER (MRN [id number]) as of 05/27/2018 10:35  Ref. Range 05/26/2018 23:43 05/27/2018 08:06 05/27/2018 10:13  Glucose-Capillary Latest Ref Range: 70 - 99 mg/dL 128 (H) 40 (LL) 65 (L)   Review of Glycemic Control  Diabetes history: DM 2 Outpatient Diabetes medications: Lantus 62 units, Novolog 20 units breakfast, 24 units lunch, 20 units supper, Tradjenta 5 mg Daily Current orders for Inpatient glycemic control: Lantus 62 units qhs, Tradjenta 5 mg Daily, Novolog 0-9 units tid, Novolog 0-5 units qhs  Inpatient Diabetes Program Recommendations:    Patient with hypoglycemia in the 40's this am on home dose of Lantus. Please reduce Lantus to 30 units. Monitor patient for hypoglycemia until this evening.  Thanks,  Tama Headings RN, MSN, BC-ADM Inpatient Diabetes Coordinator Team Pager [phone] (8a-5p)

## 2018-05-27 NOTE — Progress Notes (Addendum)
Progress Note  Patient Name: William [name] Date of Encounter: 05/27/2018  Primary Cardiologist: Peter Martinique, MD  Subjective   Remains SOB, no change in symptoms. Denies CP but says he's had daily heartburn, not exertional. Sister at bedside. Very dark urine, concentrated, noted in foley bag  Inpatient Medications    Scheduled Meds: . aspirin  81 mg Oral Daily  . atorvastatin  80 mg Oral q1800  . buPROPion  150 mg Oral BID  . carvedilol  12.5 mg Oral BID WC  . clopidogrel  75 mg Oral Daily  . darbepoetin (ARANESP) injection - NON-DIALYSIS  150 mcg Subcutaneous Q Fri-1800  . escitalopram  10 mg Oral Daily  . feeding supplement (GLUCERNA SHAKE)  237 mL Oral BID  . ferrous sulfate  325 mg Oral Q breakfast  . furosemide  40 mg Intravenous BID  . hydrALAZINE  50 mg Oral Q8H  . insulin aspart  0-5 Units Subcutaneous QHS  . insulin aspart  0-9 Units Subcutaneous TID WC  . insulin glargine  62 Units Subcutaneous QHS  . isosorbide mononitrate  60 mg Oral Daily  . linagliptin  5 mg Oral Daily  . nicotine  21 mg Transdermal Daily  . pantoprazole  40 mg Oral Daily  . polyvinyl alcohol  2 drop Both Eyes BID  . sodium chloride flush  3 mL Intravenous Q12H   Continuous Infusions: . sodium chloride    . heparin     PRN Meds: sodium chloride, acetaminophen **OR** acetaminophen, acetaminophen, calcium carbonate (dosed in mg elemental calcium), camphor-menthol **AND** hydrOXYzine, docusate sodium, feeding supplement (NEPRO CARB STEADY), hydrOXYzine, ondansetron **OR** ondansetron (ZOFRAN) IV, simethicone, sodium chloride flush, sorbitol, zolpidem   Vital Signs    Vitals:   05/26/18 2203 05/27/18 0604 05/27/18 0606 05/27/18 1009  BP:  (!) 141/73 (!) 141/73   Pulse: 78  (!) 40   Resp: (!) 25  (!) 22   Temp: 98 F (36.7 C)  98.6 F (37 C) 98.6 F (37 C)  TempSrc: Oral  Oral Oral  SpO2: 95%  100%   Weight:      Height:   5\' 8"  (1.727 m)     Intake/Output Summary (Last 24  hours) at 05/27/2018 1022 Last data filed at 05/27/2018 1008 Gross per 24 hour  Intake 228.4 ml  Output -  Net 228.4 ml   Last 3 Weights 05/26/2018 05/17/2018 05/16/2018  Weight (lbs) 172 lb 6.4 oz 172 lb 6.4 oz 183 lb 6.8 oz  Weight (kg) 78.2 kg 78.2 kg 83.2 kg     Telemetry    NSR - Personally Reviewed  Physical Exam   GEN: No acute distress.  HEENT: Normocephalic, atraumatic, sclera non-icteric. Mild exopthalmos Neck: No JVD or bruits. Cardiac: RRR no murmurs, rubs, or gallops.  Radials/DP/PT 1+ and equal bilaterally.  Respiratory: Coarse BS bilaterally. Poor inspiratory effort. Breathing is unlabored. GI: Soft, nontender, non-distended, BS +x 4. MS: no deformity. Extremities: No clubbing or cyanosis. No edema. Distal pedal pulses are 2+ and equal bilaterally. Neuro:  AAOx3. Follows commands. Psych:  Responds to questions appropriately with a normal affect, simple understanding of conversation  Labs    Chemistry Recent Labs  Lab 05/26/18 1947 05/27/18 0621  NA 134* 134*  K 3.6 3.3*  CL 98 97*  CO2 22 21*  GLUCOSE 177* 69*  BUN 74* 77*  CREATININE 4.77* 4.92*  CALCIUM 7.9* 8.2*  PROT 7.1  --   ALBUMIN 2.0* 1.9*  AST 21  --  ALT 22  --   ALKPHOS 72  --   BILITOT 0.7  --   GFRNONAA 12* 12*  GFRAA 14* 14*  ANIONGAP 14 16*     Hematology Recent Labs  Lab 05/26/18 1947 05/27/18 0621  WBC 13.0* 15.3*  RBC 3.12* 3.11*  HGB 8.6* 8.6*  HCT 27.4* 26.6*  MCV 87.8 85.5  MCH 27.6 27.7  MCHC 31.4 32.3  RDW 13.6 13.5  PLT 328 307    Cardiac EnzymesNo results for input(s): TROPONINI in the last 168 hours.  Recent Labs  Lab 05/26/18 2008  TROPIPOC 0.28*     BNP Recent Labs  Lab 05/26/18 1948  BNP 490.8*     DDimer  Recent Labs  Lab 05/26/18 1947  DDIMER 6.63*     Radiology    Dg Chest Port 1 View  Result Date: 05/26/2018 CLINICAL DATA:  Shortness of breath. EXAM: PORTABLE CHEST 1 VIEW COMPARISON:  05/13/2018. FINDINGS: Mildly enlarged cardiac  silhouette with a mild increase in size. Resolved bilateral airspace opacity. Residual interstitial prominence with [employer]. No visible pleural fluid. The right lateral costophrenic angle is not included. The lungs are mildly hyperexpanded. Thoracic spine degenerative changes. IMPRESSION: Improving changes of congestive heart failure superimposed on COPD with residual interstitial pulmonary edema. Electronically Signed   By: Claudie Revering M.D.   On: 05/26/2018 20:26    Cardiac Studies   2D Echo 05/12/18 Study Conclusions  - Left ventricle: The cavity size was normal. There was moderate   focal basal and mild concentric hypertrophy of the septum.   Systolic function was moderately to severely reduced. The   estimated ejection fraction was in the range of 30% to 35%.   Diffuse hypokinesis. Features are consistent with a pseudonormal   left ventricular filling pattern, with concomitant abnormal   relaxation and increased filling pressure (grade 2 diastolic   dysfunction). No evidence of thrombus, though echo contrast not   used. - Regional wall motion abnormality: Akinesis of the mid-apical   anterior, mid anteroseptal, mid anterolateral, apical lateral,   and apical myocardium. - Aortic valve: Possibly bicuspid; mildly thickened, mildly   calcified leaflets. - Mitral valve: Calcified annulus. - Left atrium: The atrium was moderately to severely dilated.  Impressions:  - Reduced EF, with diffuse hypokinesis. New akinesis in the   anterolateral, anterior, anteroseptal areas from mid wall to   apex. Concerning for LAD infarct or ischemia.  Patient Profile     58 y.o. male w/ former smoker with presumed ICM (EF 30-35%), HTN, poorly controlled DM, HTN, CVA resulting in LE weakness, cognitive impairment (resides in SNF), CKD III, mostly bedbound/wheelchair bound, recent NSTEMI readmitted with hypoxia and SOB.   In 08/2017 he was dx with cardiomyopathy in setting of stroke, presumed to  be ischemic but did not have w/u at that time. He was recently admitted 04/2018 with lethargy, hypoxia and hypotension, also found to have lactic acidosis, AKI, and troponin of 18. Echo showed EF 30-35%, grade 2 DD, mod-severe LAE. Cath deferred due to comorbidities and progressive renal insufficiency. Admission also notable for skin bullae. CT Chest 1/22 + pulmonary edema with probable superimposed PNA, small bilateral effusions -> PCCM followed along, recommended rx for pulm edema; tx with abx 1/22-1/24. Was discharged to SNF. Returned with worsening SOB and hypoxia with O2 requirement. Renal function has continued to deteriorate with Cr 4.92 (was 1.5-1.8 in 08/2017).  Assessment & Plan    1. Recurrent acute hypoxic respiratory failure - concerning  for multifactorial process given progressive renal failure as well, ? pulm-renal issue. BNP seems out of proportion for degree of dyspnea/hypoxia and CXR actually showed improved pulm edema compared to prior. If accurate, admit weight is similar to DC weight. Will ask for re-weigh. May benefit from pulm re-consultation. Renal function continues to deteriorate. VQ pending to exclude PE given elevated d-dimer.  2. Possible acute on chronic combined CHF - being diuresed with IV Lasix which nephrology has given OK to continue. Will order I/Os. Also placed order for weight today too.  3. CAD - managed medically recently. Will d/w MD whether necessary to continue to trend trops.   4. Progressive AKI on CKD - renal on board.  5. Anemia of chronic disease with leukocytosis - per IM/renal.  For questions or updates, please contact [place] Please consult [email or web address] for contact info under Cardiology/STEMI.  Signed, Charlie Pitter, PA-C 05/27/2018, 10:22 AM     Attending Note:   The patient was seen and examined.  Agree with assessment and plan as noted above.  Changes made to the above note as needed.  Patient seen and independently examined with   Melina Copa, PA .   We discussed all aspects of the encounter. I agree with the assessment and plan as stated above.  1.  Recurrent acute hypoxic respiratory failure:  This unfortunate patient has multiple medical issues .    He has chronic combined systolic and diastolic congestive heart failure. He has progressive renal failure with a creatinine this morning of 4.9.  Potassium is 3.3.  He is severely malnourished with a albumin level of 1.9.  His family states that he is has not eaten well for the past several weeks.  He is moderately anemic with a hemoglobin of 8.6.  His white blood cell count is 15.3.  He is not a candidate for heart catheterization or really any procedure/operation at this point. I do not think that he is a candidate for dialysis.  I think that he should be seen by palliative care/hospice. I've Suggested this to his sister who has POA .     Thayer Headings, Brooke Bonito., MD, [facility] 05/27/2018, 5:41 PM [address]  [place] Pager [phone]

## 2018-05-27 NOTE — Progress Notes (Signed)
ANTICOAGULATION CONSULT NOTE  Pharmacy Consult:  Heparin Indication: pulmonary embolus  Allergies  Allergen Reactions  . Sulfamethoxazole Rash    Patient Measurements: Height: 5\' 8"  (172.7 cm) Weight: 172 lb 6.4 oz (78.2 kg) IBW/kg (Calculated) : 68.4 Heparin Dosing Weight: 78 kg   Vital Signs: Temp: 98.6 F (37 C) (02/07 0606) Temp Source: Oral (02/07 0606) BP: 141/73 (02/07 0606) Pulse Rate: 40 (02/07 0606)  Labs: Recent Labs    05/26/18 1947 05/27/18 0621  HGB 8.6* 8.6*  HCT 27.4* 26.6*  PLT 328 307  HEPARINUNFRC  --  1.96*  CREATININE 4.77* 4.92*    Estimated Creatinine Clearance: 15.6 mL/min (A) (by C-G formula based on SCr of 4.92 mg/dL (H)).   Assessment: 49 YOM presented with SOB and elevated d-dimer, and started on IV heparin for rule out PE.  Heparin level is supra-therapeutic this AM at 1.96.  Confirmed with patient that lab was drawn on the opposite arm of the heparin infusion.  No bleeding reported.  It is puzzling that patient was therapeutic at ~0.4 last admit with heparin infusion at 1800 units/hr.  His renal function; however, was better at that time (SCr ~3.5 compared to now at 4.92).  Goal of Therapy:  Heparin level 0.3-0.7 units/ml Monitor platelets by anticoagulation protocol: Yes   Plan:  Hold heparin for 1.5 hours (held at 0830) At 1000, restart heparin infusion at 1000 units/hr Check 8 hr heparin level Daily heparin level and CBC F/u work-up for PE to determine duration of heparin   Khale Nigh D. Mina Marble, PharmD, BCPS, [place] 05/27/2018, 8:34 AM

## 2018-05-28 LAB — CBC
HCT: 23.1 % — ABNORMAL LOW (ref 39.0–52.0)
Hemoglobin: 7.8 g/dL — ABNORMAL LOW (ref 13.0–17.0)
MCH: 29 pg (ref 26.0–34.0)
MCHC: 33.8 g/dL (ref 30.0–36.0)
MCV: 85.9 fL (ref 80.0–100.0)
Platelets: 276 10*3/uL (ref 150–400)
RBC: 2.69 MIL/uL — ABNORMAL LOW (ref 4.22–5.81)
RDW: 13.7 % (ref 11.5–15.5)
WBC: 11.4 10*3/uL — ABNORMAL HIGH (ref 4.0–10.5)
nRBC: 0 % (ref 0.0–0.2)

## 2018-05-28 LAB — MPO/PR-3 (ANCA) ANTIBODIES
ANCA Proteinase 3: 4.2 U/mL — ABNORMAL HIGH (ref 0.0–3.5)
Myeloperoxidase Abs: <9 U/mL (ref 0.0–9.0)

## 2018-05-28 LAB — GLUCOSE, CAPILLARY
Glucose-Capillary: 45 mg/dL — ABNORMAL LOW (ref 70–99)
Glucose-Capillary: 81 mg/dL (ref 70–99)
Glucose-Capillary: 84 mg/dL (ref 70–99)
Glucose-Capillary: 104 mg/dL — ABNORMAL HIGH (ref 70–99)
Glucose-Capillary: 101 mg/dL — ABNORMAL HIGH (ref 70–99)

## 2018-05-28 LAB — RENAL FUNCTION PANEL
Albumin: 1.7 g/dL — ABNORMAL LOW (ref 3.5–5.0)
Anion gap: 13 (ref 5–15)
BUN: 84 mg/dL — ABNORMAL HIGH (ref 6–20)
CO2: 22 mmol/L (ref 22–32)
Calcium: 7.9 mg/dL — ABNORMAL LOW (ref 8.9–10.3)
Chloride: 100 mmol/L (ref 98–111)
Creatinine, Ser: 5.21 mg/dL — ABNORMAL HIGH (ref 0.61–1.24)
GFR calc Af Amer: 13 mL/min — ABNORMAL LOW (ref >60)
GFR, EST NON AFRICAN AMERICAN: 11 mL/min — AB (ref >60)
Glucose, Bld: 76 mg/dL (ref 70–99)
Phosphorus: 5.1 mg/dL — ABNORMAL HIGH (ref 2.5–4.6)
Potassium: 3.1 mmol/L — ABNORMAL LOW (ref 3.5–5.1)
SODIUM: 135 mmol/L (ref 135–145)

## 2018-05-28 LAB — IRON AND TIBC
Iron: 16 ug/dL — ABNORMAL LOW (ref 45–182)
Saturation Ratios: 13 % — ABNORMAL LOW (ref 17.9–39.5)
TIBC: 127 ug/dL — ABNORMAL LOW (ref 250–450)
UIBC: 111 ug/dL

## 2018-05-28 LAB — TSH: TSH: 8.783 u[IU]/mL — AB (ref 0.350–4.500)

## 2018-05-28 LAB — FERRITIN: Ferritin: 92 ng/mL (ref 24–336)

## 2018-05-28 LAB — T4, FREE: Free T4: 0.91 ng/dL (ref 0.82–1.77)

## 2018-05-28 MED ORDER — POTASSIUM CHLORIDE 20 MEQ PO PACK
40.0000 meq | PACK | Freq: Once | ORAL | Status: AC
  Administered 2018-05-28: 40 meq via ORAL
  Filled 2018-05-28 (×2): qty 2

## 2018-05-28 MED ORDER — INSULIN GLARGINE 100 UNIT/ML ~~LOC~~ SOLN
50.0000 [IU] | Freq: Every day | SUBCUTANEOUS | Status: DC
  Administered 2018-05-28: 50 [IU] via SUBCUTANEOUS
  Filled 2018-05-28 (×2): qty 0.5

## 2018-05-28 MED ORDER — DARBEPOETIN ALFA 150 MCG/0.3ML IJ SOSY: 150.0000 ug | PREFILLED_SYRINGE | INTRAMUSCULAR | Status: DC

## 2018-05-28 MED ORDER — POTASSIUM CHLORIDE 20 MEQ PO PACK
40.0000 meq | PACK | Freq: Once | ORAL | Status: AC
  Administered 2018-05-28: 40 meq via ORAL
  Filled 2018-05-28: qty 2

## 2018-05-28 NOTE — Progress Notes (Signed)
[facility]    NEPHROLOGY PROGRESS NOTE  SUBJECTIVE: Patient with no acute complaints today other than intermittent nausea.  Denies chest pain, shortness of breath, fevers, chills, or other symptoms.   OBJECTIVE:  Vitals:   05/28/18 1029 05/28/18 1455  BP: 111/60 (!) 110/94  Pulse: 74 68  Resp:  18  Temp:  (!) 97.5 F (36.4 C)  SpO2:  98%    Intake/Output Summary (Last 24 hours) at 05/28/2018 1514 Last data filed at 05/28/2018 1456 Gross per 24 hour  Intake 182 ml  Output 650 ml  Net -468 ml      Genearl: Alert to self, answers in one-word answers NAD HEENT: MMM [place] AT anicteric sclera Neck:  No JVD, no adenopathy CV:  Heart RRR  Lungs:  L/S CTA bilaterally Abd:  abd SNT/ND with normal BS GU:  Bladder non-palpable Extremities:  No LE edema. Skin: Multiple excoriations all over her arms and legs.  MEDICATIONS:  . aspirin  81 mg Oral Daily  . atorvastatin  80 mg Oral q1800  . buPROPion  150 mg Oral BID  . carvedilol  12.5 mg Oral BID WC  . clopidogrel  75 mg Oral Daily  . [START ON 06/04/2018] darbepoetin (ARANESP) injection - NON-DIALYSIS  150 mcg Subcutaneous Q Sat-1800  . escitalopram  10 mg Oral Daily  . feeding supplement (GLUCERNA SHAKE)  237 mL Oral BID  . ferrous sulfate  325 mg Oral Q breakfast  . furosemide  40 mg Intravenous BID  . hydrALAZINE  50 mg Oral Q8H  . insulin aspart  0-5 Units Subcutaneous QHS  . insulin aspart  0-9 Units Subcutaneous TID WC  . insulin glargine  62 Units Subcutaneous QHS  . isosorbide mononitrate  60 mg Oral Daily  . linagliptin  5 mg Oral Daily  . nicotine  21 mg Transdermal Daily  . pantoprazole  40 mg Oral Daily  . polyvinyl alcohol  2 drop Both Eyes BID  . senna-docusate  2 tablet Oral BID  . sodium chloride flush  3 mL Intravenous Q12H       LABS:   CBC Latest Ref Rng & Units 05/28/2018 05/27/2018 05/26/2018  WBC 4.0 - 10.5 K/uL 11.4(H) 15.3(H) 13.0(H)  Hemoglobin 13.0 - 17.0 g/dL 7.8(L) 8.6(L) 8.6(L)   Hematocrit 39.0 - 52.0 % 23.1(L) 26.6(L) 27.4(L)  Platelets 150 - 400 K/uL 276 307 328    CMP Latest Ref Rng & Units 05/28/2018 05/27/2018 05/26/2018  Glucose 70 - 99 mg/dL 76 69(L) 177(H)  BUN 6 - 20 mg/dL 84(H) 77(H) 74(H)  Creatinine 0.61 - 1.24 mg/dL 5.21(H) 4.92(H) 4.77(H)  Sodium 135 - 145 mmol/L 135 134(L) 134(L)  Potassium 3.5 - 5.1 mmol/L 3.1(L) 3.3(L) 3.6  Chloride 98 - 111 mmol/L 100 97(L) 98  CO2 22 - 32 mmol/L 22 21(L) 22  Calcium 8.9 - 10.3 mg/dL 7.9(L) 8.2(L) 7.9(L)  Total Protein 6.5 - 8.1 g/dL - - 7.1  Total Bilirubin 0.3 - 1.2 mg/dL - - 0.7  Alkaline Phos 38 - 126 U/L - - 72  AST 15 - 41 U/L - - 21  ALT 0 - 44 U/L - - 22    Lab Results  Component Value Date   CALCIUM 7.9 (L) 05/28/2018   CAION 1.15 05/11/2018   PHOS 5.1 (H) 05/28/2018       Component Value Date/Time   COLORURINE AMBER (A) 05/27/2018 2020   APPEARANCEUR CLOUDY (A) 05/27/2018 2020   LABSPEC 1.018 05/27/2018 2020  PHURINE 5.0 05/27/2018 2020   GLUCOSEU NEGATIVE 05/27/2018 2020   HGBUR LARGE (A) 05/27/2018 2020   BILIRUBINUR NEGATIVE 05/27/2018 2020   KETONESUR NEGATIVE 05/27/2018 2020   PROTEINUR >=300 (A) 05/27/2018 2020   UROBILINOGEN 0.2 09/19/2010 0945   NITRITE NEGATIVE 05/27/2018 2020   LEUKOCYTESUR NEGATIVE 05/27/2018 2020      Component Value Date/Time   PHART 7.359 05/11/2018 2054   PCO2ART 39.3 05/11/2018 2054   PO2ART 56.0 (L) 05/11/2018 2054   HCO3 22.2 05/11/2018 2054   TCO2 23 05/11/2018 2054   ACIDBASEDEF 3.0 (H) 05/11/2018 2054   O2SAT 88.0 05/11/2018 2054      Component Value Date/Time   IRON 16 (L) 05/28/2018 0518   TIBC 127 (L) 05/28/2018 0518   FERRITIN 92 05/28/2018 0518   IRONPCTSAT 13 (L) 05/28/2018 0518   IRONPCTSAT 23 05/25/2017 1612       ASSESSMENT/PLAN:     1. Chronic kidney disease stage III with a baseline serum creatinine around 1.6 from last year.  He has had progressive worsening of his renal disease over the past couple of months.  He does have  underlying hematuria and proteinuria.  He also has a longstanding history of hypertension and diabetes.  Given his hematuria, proteinuria, and rapid progression of renal dysfunction, I suspect he has some underlying [place] low nephritis.  Discussed with the patient sister, who states she is the POA.  She does not want dialysis or aggressive work-up.  Is interested in hospice.  We will hold off on any additional work-up.  2.  Hypertension.  Blood pressures are currently stable.  3.  Shortness of breath.  Oxygenation is stable on room air.  Suspect anemia is contributing.  4.  Anemia.  Continue ESA.  Iron studies are poor.  We will add ferrous sulfate.  Family is requesting hospice.  Will be available as needed.  Please call if status changes to full treatment.  Patient is not a dialysis candidate.  Agree with family's decision for hospice.    [place], DO, [place]

## 2018-05-28 NOTE — Progress Notes (Signed)
Hypoglycemic Event  CBG: 45  Treatment: 1 cup orange juice  Symptoms: none noted  Follow-up CBG: Time:0805 CBG Result:81  Possible Reasons for Event: poor P.O intake  Comments/MD notified:Dr. Denton Brick, text page via Radene Knee, [name]

## 2018-05-28 NOTE — Progress Notes (Signed)
[facility] will sign off.   Medication Recommendations:  See note on 05/27/18 Other recommendations (labs, testing, etc):  None Follow up as an outpatient:  prn

## 2018-05-28 NOTE — Progress Notes (Addendum)
Patient Demographics:    William [name], is a 58 y.o. male, DOB - 1960-09-15, JOA:[id number]  Admit date - 05/26/2018   Admitting Physician Elwyn Reach, MD  Outpatient Primary MD for the patient is William Channel Brayton Mars, MD  LOS - 2   Chief Complaint  Patient presents with  . Shortness of Breath        Subjective:    William [name] today has no fevers, no emesis,  No chest pain, poor oral intake, d/w sister (POA) William [name]  Assessment  & Plan :    Principal Problem:   Acute respiratory failure with hypoxemia [facility]) Active Problems:   Type II diabetes mellitus with renal manifestations ([place])   Hyperlipidemia   Tobacco use disorder   Hypertension associated with diabetes ([place])   Stage 3 chronic renal impairment associated with type 2 diabetes mellitus ([place])   Pulmonary edema   Acute on chronic combined systolic and diastolic CHF (congestive heart failure) ([place])  Brief Summary 58 y.o. male w/ former smoker with presumed ICM (EF 30-35%), HTN, poorly controlled DM, HTN, prior CVA resulting in LE weakness, cognitive impairment (resides in SNF), CKD IV, mostly bedbound/wheelchair bound, recent NSTEMI admitted to 07/08/2018 with cough, shortness of breath and new onset hypoxia. VQ scan low probability for PE. [name] (POA) William [name] is requesting palliative care consultation   Plan:- 1)New onset hypoxia/acute hypoxic respiratory failure--- ???  Etiology, clinically and by imaging findings no evidence of pneumonia, VQ scan with low possibility for PE,, clinically suspect CHF exacerbation, stop IV heparin given VQ scan findings, hypoxia is resolving  2)HFrEF--- patient with history of combined diastolic and systolic dysfunction CHF with presumed ICM, last known EF 30 to 35 %, BNP is 480, troponin 0 0.28 , cardiology consult appreciated, per nephrology and cardiology services okay to treat  with Lasix , continue Coreg 12.5 mg twice daily and hydralazine 50 mg 3 times daily/ Imdur 60 mg daily  3)H/o CVA--- patient has residual cognitive deficits as well as difficulty with mobility related activities of daily living mostly bedbound/wheelchair bound, lives at SNF  4)AKI----acute kidney injury on CKD stage -     creatinine on admission=4.77 (up from 3.58 days prior to admission),   baseline creatinine =    , creatinine is now=      , renally adjust medications, avoid nephrotoxic agents/dehydration/hypotension, allergy consult appreciated, renal ultrasound and Foley ordered by nephrology service, urine output is poor  5)Anemia of chronic kidney disease--- ESA/Aranesp per nephrology service, evidence of ongoing bleeding , hemoglobin down to 7.8  6) tobacco abuse--- smoking cessation advised, okay to use nicotine patch  7)DM2--A1c 6.3 reflecting poor diabetic control, patient with recurrent episodes of hypoglycemia due to poor oral intake, so decrease Lantus insulin to 50 units from 62 units nightly along with sliding scale coverage, STOP Tradjenta 5 mg daily  8)H/o CAD/ICM--- last known EF 30 to 35%, patient had an STEMI in January 2020, continue aspirin 81 mg daily, Lipitor 80 mg every afternoon, Coreg 12.5 mg twice daily and Plavix 75 mg daily, continue isosorbide 60 mg daily  9)Social/Ethics---  d/w sister (POA) William [name], patient is a DNR/DNI, has renal and cardiac failure, as well as worsening anemia,  POA would like palliative care consult for goals of care, at this time they declined aggressive treatment protocols  Disposition/Need for in-Hospital Stay- patient unable to be discharged at this time due worsening renal failure and worsening CHF requiring IV diuresis  Code Status : DNR  Family Communication:   Niece at bedside  Disposition Plan  : SNF  Consults  :  Cardiology/Nephrology/palliative care  DVT Prophylaxis  :   x - Heparin -   Lab Results  Component Value  Date   PLT 276 05/28/2018    Inpatient Medications  Scheduled Meds: . aspirin  81 mg Oral Daily  . atorvastatin  80 mg Oral q1800  . buPROPion  150 mg Oral BID  . carvedilol  12.5 mg Oral BID WC  . clopidogrel  75 mg Oral Daily  . [START ON 06/04/2018] darbepoetin (ARANESP) injection - NON-DIALYSIS  150 mcg Subcutaneous Q Sat-1800  . escitalopram  10 mg Oral Daily  . feeding supplement (GLUCERNA SHAKE)  237 mL Oral BID  . ferrous sulfate  325 mg Oral Q breakfast  . furosemide  40 mg Intravenous BID  . hydrALAZINE  50 mg Oral Q8H  . insulin aspart  0-5 Units Subcutaneous QHS  . insulin aspart  0-9 Units Subcutaneous TID WC  . insulin glargine  62 Units Subcutaneous QHS  . isosorbide mononitrate  60 mg Oral Daily  . linagliptin  5 mg Oral Daily  . nicotine  21 mg Transdermal Daily  . pantoprazole  40 mg Oral Daily  . polyvinyl alcohol  2 drop Both Eyes BID  . senna-docusate  2 tablet Oral BID  . sodium chloride flush  3 mL Intravenous Q12H   Continuous Infusions: . sodium chloride     PRN Meds:.sodium chloride, acetaminophen **OR** acetaminophen, acetaminophen, calcium carbonate (dosed in mg elemental calcium), camphor-menthol **AND** hydrOXYzine, docusate sodium, feeding supplement (NEPRO CARB STEADY), hydrOXYzine, ondansetron **OR** ondansetron (ZOFRAN) IV, simethicone, sodium chloride flush, sorbitol, zolpidem    Anti-infectives (From admission, onward)   None        Objective:   Vitals:   05/27/18 2023 05/28/18 0419 05/28/18 1029 05/28/18 1455  BP:  95/67 111/60 (!) 110/94  Pulse: 70 73 74 68  Resp: 15 18  18   Temp: 98.7 F (37.1 C) 98.7 F (37.1 C)  (!) 97.5 F (36.4 C)  TempSrc: Axillary Axillary  Oral  SpO2: 96% 99%  98%  Weight:  80.2 kg    Height:        Wt Readings from Last 3 Encounters:  05/28/18 80.2 kg  05/17/18 78.2 kg  02/04/18 80.3 kg     Intake/Output Summary (Last 24 hours) at 05/28/2018 1552 Last data filed at 05/28/2018 1456 Gross per  24 hour  Intake 182 ml  Output 650 ml  Net -468 ml     Physical Exam Patient is examined daily including today on 05/28/18 , exams remain the same as of yesterday except that has changed   Gen:- Awake Alert, in no acute distress HEENT:- [place].AT, No sclera icterus Nose- [place] 1 L/min Neck-Supple Neck.  Lungs-diminished in bases with bibasilar rales CV- S1, S2 normal, regular  Abd-  +ve B.Sounds, Abd Soft, No tenderness,    Extremity/Skin:- +ve edema, pedal pulses present  Psych-affect is flat, some cognitive deficits Neuro-left-sided hemiparesis  GU--- condom cath with less dark urine   Data Review:   Micro Results No results found for this or any previous visit (from the past 240 hour(s)).  Radiology Reports Ct Chest Wo Contrast  Result Date: 05/11/2018 CLINICAL DATA:  58 year old male with hypoxia. EXAM: CT CHEST WITHOUT CONTRAST TECHNIQUE: Multidetector CT imaging of the chest was performed following the standard protocol without IV contrast. COMPARISON:  Chest radiograph dated 05/11/2018 FINDINGS: Evaluation of this exam is limited in the absence of intravenous contrast. Cardiovascular: There is mild cardiomegaly. No significant pericardial effusion. There is multi vessel coronary vascular calcification. There is hypoattenuation of the cardiac blood pool suggestive of a degree of anemia. Clinical correlation is recommended. There is moderate atherosclerotic calcification of the thoracic aorta. The central pulmonary arteries are grossly unremarkable. Mediastinum/Nodes: Top-normal paratracheal lymph nodes measure up to 10 mm in short axis. Subcarinal lymph node measures 17 mm in short axis. The esophagus is grossly unremarkable. No mediastinal fluid collection. Lungs/Pleura: There are small bilateral pleural effusions. There is diffuse interstitial and interlobular septal prominence with Kerley B-lines as well as patchy areas of central and perihilar ground-glass density. Findings most  consistent with pulmonary edema with probable superimposed pneumonia. Clinical correlation is recommended. There is no pneumothorax. The central airways are patent. Upper Abdomen: There are stones within the gallbladder. There is slight irregularity of the liver contour which may represent early changes of cirrhosis. Clinical correlation is recommended. Musculoskeletal: Degenerative changes of the spine. No acute osseous pathology. IMPRESSION: 1. Findings most consistent with pulmonary edema with probable superimposed pneumonia. Clinical correlation is recommended. 2. Small bilateral pleural effusions. 3. Cholelithiasis. Electronically Signed   By: Anner Crete M.D.   On: 05/11/2018 19:59   US Renal  Result Date: 05/12/2018 CLINICAL DATA:  Renal injury. EXAM: RENAL / URINARY TRACT ULTRASOUND COMPLETE COMPARISON:  No prior. FINDINGS: Right Kidney: Renal measurements: 10.7 x 5.7 x 5.3 cm = volume: 168.2 mL. Increased echogenicity. No mass or hydronephrosis visualized. Left Kidney: Renal measurements: 10.6 x 7.0 x 6.7 cm = volume: 258.0 mL. Increased echogenicity. No mass or hydronephrosis visualized. Bladder: Foley catheter in the bladder. Right pleural effusion incidentally noted. IMPRESSION: 1. Increased renal echogenicity noted bilaterally. These findings consistent chronic medical renal disease. No acute abnormality identified. No hydronephrosis or bladder distention. 2.  Right pleural effusion is incidentally noted. Electronically Signed   By: Marcello Moores  Register   On: 05/12/2018 11:40   Nm Pulmonary Perf And Vent  Result Date: 05/27/2018 CLINICAL DATA:  Respiratory failure EXAM: NUCLEAR MEDICINE VENTILATION - PERFUSION LUNG SCAN VIEWS: Anterior, posterior, left lateral, right lateral, RPO, LPO, RAO, LAO-ventilation and perfusion RADIOPHARMACEUTICALS:  32.3 mCi of Tc-70m DTPA aerosol inhalation and 4.30 mCi Tc56m MAA IV COMPARISON:  Chest radiograph May 26, 2018 FINDINGS: Ventilation: Radiotracer  uptake is homogeneous and symmetric bilaterally. No ventilation defects evident. Perfusion: Radiotracer uptake is homogeneous and symmetric bilaterally. No perfusion defects evident. IMPRESSION: No appreciable ventilation or perfusion defects. Very low probability of pulmonary embolus. Electronically Signed   By: Lowella Grip III M.D.   On: 05/27/2018 13:17   Dg Chest Port 1 View  Result Date: 05/26/2018 CLINICAL DATA:  Shortness of breath. EXAM: PORTABLE CHEST 1 VIEW COMPARISON:  05/13/2018. FINDINGS: Mildly enlarged cardiac silhouette with a mild increase in size. Resolved bilateral airspace opacity. Residual interstitial prominence with [employer]. No visible pleural fluid. The right lateral costophrenic angle is not included. The lungs are mildly hyperexpanded. Thoracic spine degenerative changes. IMPRESSION: Improving changes of congestive heart failure superimposed on COPD with residual interstitial pulmonary edema. Electronically Signed   By: Claudie Revering M.D.   On: 05/26/2018 20:26   Dg Chest  Port 1 View  Result Date: 05/13/2018 CLINICAL DATA:  Pulmonary edema short of breath EXAM: PORTABLE CHEST 1 VIEW COMPARISON:  05/12/2018 FINDINGS: Progression of by lateral airspace disease which is perihilar in location. No effusion. Heart size upper normal. IMPRESSION: Progression of bilateral airspace disease compatible with edema. Electronically Signed   By: Franchot Gallo M.D.   On: 05/13/2018 07:48   Dg Chest Port 1 View  Result Date: 05/12/2018 CLINICAL DATA:  Shortness of breath.  Respiratory failure. EXAM: PORTABLE CHEST 1 VIEW COMPARISON:  May 11, 2018 FINDINGS: No pneumothorax. Increasing bilateral pulmonary opacities, particularly in the bases. Diffuse interstitial prominence. The heart size is borderline. No other interval changes. IMPRESSION: 1. Worsening of bilateral pulmonary opacities, suggested to represent pneumonia superimposed on a background of edema on the CT scan from  May 11, 2018. Recommend clinical correlation and follow-up to resolution. Electronically Signed   By: Dorise Bullion III M.D   On: 05/12/2018 08:34   Dg Chest Port 1 View  Result Date: 05/11/2018 CLINICAL DATA:  Shortness of breath, weakness, and vomiting. EXAM: PORTABLE CHEST 1 VIEW COMPARISON:  05/25/2017 FINDINGS: Mild cardiomegaly. Moderate diffuse interstitial infiltrates with Kerley B-lines, consistent with diffuse interstitial edema. No evidence of focal consolidation or significant pleural effusion. IMPRESSION: Cardiomegaly and diffuse interstitial edema, consistent with congestive heart failure. Electronically Signed   By: Earle Gell M.D.   On: 05/11/2018 18:47   Vas [place] Lower Extremity Venous (dvt)  Result Date: 05/12/2018  Lower Venous Study Indications: SOB.  Performing Technologist: Toma Copier RVS  Examination Guidelines: A complete evaluation includes B-mode imaging, spectral Doppler, color Doppler, and power Doppler as needed of all accessible portions of each vessel. Bilateral testing is considered an integral part of a complete examination. Limited examinations for reoccurring indications may be performed as noted.  Right Venous Findings: +---------+---------------+---------+-----------+----------+-------+          CompressibilityPhasicitySpontaneityPropertiesSummary +---------+---------------+---------+-----------+----------+-------+ CFV      Full           Yes      Yes                          +---------+---------------+---------+-----------+----------+-------+ SFJ      Full                                                 +---------+---------------+---------+-----------+----------+-------+ FV Prox  Full           Yes      Yes                          +---------+---------------+---------+-----------+----------+-------+ FV Mid   Full                                                  +---------+---------------+---------+-----------+----------+-------+ FV DistalFull           Yes      Yes                          +---------+---------------+---------+-----------+----------+-------+ PFV      Full           Yes  Yes                          +---------+---------------+---------+-----------+----------+-------+ POP      Full           Yes      Yes                          +---------+---------------+---------+-----------+----------+-------+ PTV      Full                                                 +---------+---------------+---------+-----------+----------+-------+ PERO     Full                                                 +---------+---------------+---------+-----------+----------+-------+ Mild technical difficulty due to involuntary jerking of the legs  Left Venous Findings: +---------+---------------+---------+-----------+----------+-------+          CompressibilityPhasicitySpontaneityPropertiesSummary +---------+---------------+---------+-----------+----------+-------+ CFV      Full           Yes      Yes                          +---------+---------------+---------+-----------+----------+-------+ SFJ      Full                                                 +---------+---------------+---------+-----------+----------+-------+ FV Prox  Full           Yes      Yes                          +---------+---------------+---------+-----------+----------+-------+ FV Mid   Full                                                 +---------+---------------+---------+-----------+----------+-------+ FV DistalFull           Yes      Yes                          +---------+---------------+---------+-----------+----------+-------+ PFV      Full           Yes      Yes                          +---------+---------------+---------+-----------+----------+-------+ POP      Full           Yes      Yes                           +---------+---------------+---------+-----------+----------+-------+ PTV      Full                                                 +---------+---------------+---------+-----------+----------+-------+  PERO     Full                                                 +---------+---------------+---------+-----------+----------+-------+ Mild technical difficulty due to involuntary jerking of the leg    Summary: Right: There is no evidence of deep vein thrombosis in the lower extremity. No cystic structure found in the popliteal fossa. Left: There is no evidence of deep vein thrombosis in the lower extremity. No cystic structure found in the popliteal fossa.  *See table(s) above for measurements and observations. Electronically signed by Monica Martinez MD on 05/12/2018 at 7:25:49 PM.    Final      CBC Recent Labs  Lab 05/26/18 1947 05/27/18 0621 05/28/18 0518  WBC 13.0* 15.3* 11.4*  HGB 8.6* 8.6* 7.8*  HCT 27.4* 26.6* 23.1*  PLT 328 307 276  MCV 87.8 85.5 85.9  MCH 27.6 27.7 29.0  MCHC 31.4 32.3 33.8  RDW 13.6 13.5 13.7  LYMPHSABS 1.1  --   --   MONOABS 1.1*  --   --   EOSABS 0.2  --   --   BASOSABS 0.1  --   --     Chemistries  Recent Labs  Lab 05/26/18 1947 05/27/18 0621 05/28/18 0518  NA 134* 134* 135  K 3.6 3.3* 3.1*  CL 98 97* 100  CO2 22 21* 22  GLUCOSE 177* 69* 76  BUN 74* 77* 84*  CREATININE 4.77* 4.92* 5.21*  CALCIUM 7.9* 8.2* 7.9*  AST 21  --   --   ALT 22  --   --   ALKPHOS 72  --   --   BILITOT 0.7  --   --    ------------------------------------------------------------------------------------------------------------------ No results for input(s): CHOL, HDL, LDLCALC, TRIG, CHOLHDL, LDLDIRECT in the last 72 hours.  Lab Results  Component Value Date   HGBA1C 8.3 (H) 05/12/2018   ------------------------------------------------------------------------------------------------------------------ Recent Labs    05/28/18 0518  TSH 8.783*    ------------------------------------------------------------------------------------------------------------------ Recent Labs    05/28/18 0518  FERRITIN 92  TIBC 127*  IRON 16*    Coagulation profile No results for input(s): INR, PROTIME in the last 168 hours.  Recent Labs    05/26/18 1947  DDIMER 6.63*    Cardiac Enzymes No results for input(s): CKMB, TROPONINI, MYOGLOBIN in the last 168 hours.  Invalid input(s): CK ------------------------------------------------------------------------------------------------------------------    Component Value Date/Time   BNP 490.8 (H) 05/26/2018 1948     Roxan Hockey M.D on 05/28/2018 at 3:52 PM  Go to [email or web address] - for contact info  [facility] - Office  [phone]

## 2018-05-28 NOTE — NC FL2 (Signed)
[place] LEVEL OF CARE SCREENING TOOL     IDENTIFICATION  Patient Name: William [name] Birthdate: [birth year] Sex: male Admission Date (Current Location): 05/26/2018  [facility] and [place] Number:  Herbalist and Address:  [facility] [place], [place] [address] [place], [place] [address]      Provider Number: [id number]  Attending Physician Name and Address:  Roxan Hockey, MD  Relative Name and Phone Number:  [name] (sister) [phone]    Current Level of Care: Hospital Recommended Level of Care: [place] Prior Approval Number:    Date Approved/Denied: 08/19/17 PASRR Number: [id number]  Discharge Plan: SNF    Current Diagnoses: Patient Active Problem List   Diagnosis Date Noted  . Acute on chronic combined systolic and diastolic CHF (congestive heart failure) ([place])   . Acute respiratory failure with hypoxemia ([place]) 05/26/2018  . Pulmonary edema   . Hypokalemia   . Acute respiratory failure with hypoxia ([place])   . ST elevation myocardial infarction (STEMI) ([place])   . NSTEMI (non-ST elevated myocardial infarction) ([place])   . Acute pulmonary edema (HCC)   . Systolic heart failure ([place]) 05/11/2018  . Hypertension associated with stage 3 chronic kidney disease due to type 2 diabetes mellitus ([place]) 02/04/2018  . Hypertension associated with diabetes ([place]) 08/31/2017  . Hemiparesis affecting left side as late effect of cerebrovascular accident ([place]) 08/31/2017  . Dyslipidemia associated with type 2 diabetes mellitus ([place]) 08/31/2017  . Type 2 diabetes mellitus with stage 3 chronic kidney disease, with long-term current use of insulin ([place]) 08/31/2017  . Stage 3 chronic renal impairment associated with type 2 diabetes mellitus ([place]) 08/31/2017  . Anemia of chronic disease 08/31/2017  . Stroke (cerebrum) ([place]) 08/19/2017  . Frequent falls   . Urinary incontinence   . Abnormal gait 07/14/2017  . Depression, major,  single episode, mild ([place]) 04/23/2017  . Inactivity 02/09/2017  . Iron deficiency anemia 06/25/2015  . CKD (chronic kidney disease), stage III ([place]) 04/30/2014  . Tobacco use disorder 11/21/2013  . GERD without esophagitis 09/25/2013  . Type II diabetes mellitus with renal manifestations ([place]) 09/28/2006  . Hyperlipidemia 09/28/2006  . Essential hypertension 09/28/2006  . History of cardiovascular disorder 09/28/2006    Orientation RESPIRATION BLADDER Height & Weight     Self, Time, Situation, Place  Normal Incontinent, External catheter Weight: 80.2 kg Height:  5\' 8"  (172.7 cm)  BEHAVIORAL SYMPTOMS/MOOD NEUROLOGICAL BOWEL NUTRITION STATUS      Incontinent Diet(see discharge summary)  AMBULATORY STATUS COMMUNICATION OF NEEDS Skin   Extensive Assist Verbally Other (Comment)(excoriated scratch marks, )                       Personal Care Assistance Level of Assistance  Bathing, Feeding, Dressing, Total care Bathing Assistance: Maximum assistance Feeding assistance: Limited assistance(needs sit up) Dressing Assistance: Maximum assistance Total Care Assistance: Maximum assistance   Functional Limitations Info  Sight, Hearing, Speech Sight Info: Adequate Hearing Info: Adequate Speech Info: Adequate    SPECIAL CARE FACTORS FREQUENCY  PT (By licensed PT), OT (By licensed OT)     PT Frequency: min 5x weekly OT Frequency: min 5x weekly            Contractures Contractures Info: Not present    Additional Factors Info  Code Status, Allergies Code Status Info: DNR Allergies Info: Sulfamethoxazole           Current Medications (05/28/2018):  This is the  current hospital active medication list Current Facility-Administered Medications  Medication Dose Route Frequency Provider Last Rate Last Dose  . 0.9 %  sodium chloride infusion  250 mL Intravenous PRN Elwyn Reach, MD      . acetaminophen (TYLENOL) tablet 650 mg  650 mg Oral Q6H PRN Elwyn Reach, MD        Or  . acetaminophen (TYLENOL) suppository 650 mg  650 mg Rectal Q6H PRN Elwyn Reach, MD      . acetaminophen (TYLENOL) tablet 650 mg  650 mg Oral Q6H PRN Elwyn Reach, MD   650 mg at 05/27/18 8185  . aspirin chewable tablet 81 mg  81 mg Oral Daily Elwyn Reach, MD   81 mg at 05/27/18 6314  . atorvastatin (LIPITOR) tablet 80 mg  80 mg Oral q1800 Gala Romney L, MD      . buPROPion [facility] SR) 12 hr tablet 150 mg  150 mg Oral BID Gala Romney L, MD   150 mg at 05/27/18 2309  . calcium carbonate (dosed in mg elemental calcium) suspension 500 mg of elemental calcium  500 mg of elemental calcium Oral Q6H PRN Elwyn Reach, MD      . camphor-menthol (SARNA) lotion 1 application  1 application Topical H7W PRN Elwyn Reach, MD       And  . hydrOXYzine (ATARAX/VISTARIL) tablet 25 mg  25 mg Oral Q8H PRN Jonelle Sidle, Mohammad L, MD      . carvedilol (COREG) tablet 12.5 mg  12.5 mg Oral BID WC Gala Romney L, MD   12.5 mg at 05/27/18 0832  . clopidogrel (PLAVIX) tablet 75 mg  75 mg Oral Daily Elwyn Reach, MD   75 mg at 05/27/18 0831  . Darbepoetin Alfa (ARANESP) injection 150 mcg  150 mcg Subcutaneous Q [id number] Corliss Parish, MD   150 mcg at 05/28/18 0011  . docusate sodium (ENEMEEZ) enema 283 mg  1 enema Rectal PRN Gala Romney L, MD      . escitalopram (LEXAPRO) tablet 10 mg  10 mg Oral Daily Elwyn Reach, MD   10 mg at 05/27/18 0831  . feeding supplement (GLUCERNA SHAKE) (GLUCERNA SHAKE) liquid 237 mL  237 mL Oral BID Gala Romney L, MD      . feeding supplement (NEPRO CARB STEADY) liquid 237 mL  237 mL Oral TID PRN Gala Romney L, MD      . ferrous sulfate tablet 325 mg  325 mg Oral Q breakfast Elwyn Reach, MD   325 mg at 05/27/18 0832  . furosemide (LASIX) injection 40 mg  40 mg Intravenous BID Gala Romney L, MD   40 mg at 05/27/18 0831  . hydrALAZINE (APRESOLINE) tablet 50 mg  50 mg Oral Q8H Garba, Mohammad L, MD   50 mg at  05/28/18 0542  . hydrOXYzine (ATARAX/VISTARIL) tablet 25 mg  25 mg Oral Q6H PRN Gala Romney L, MD      . insulin aspart (novoLOG) injection 0-5 Units  0-5 Units Subcutaneous QHS Garba, Mohammad L, MD      . insulin aspart (novoLOG) injection 0-9 Units  0-9 Units Subcutaneous TID WC Garba, Mohammad L, MD      . insulin glargine (LANTUS) injection 62 Units  62 Units Subcutaneous QHS Elwyn Reach, MD   62 Units at 05/27/18 2310  . isosorbide mononitrate (IMDUR) 24 hr tablet 60 mg  60 mg Oral Daily Elwyn Reach, MD   60 mg  at 05/27/18 6045  . linagliptin (TRADJENTA) tablet 5 mg  5 mg Oral Daily Elwyn Reach, MD   5 mg at 05/27/18 4098  . nicotine (NICODERM CQ - dosed in mg/24 hours) patch 21 mg  21 mg Transdermal Daily Elwyn Reach, MD   21 mg at 05/27/18 0831  . ondansetron (ZOFRAN) tablet 4 mg  4 mg Oral Q6H PRN Elwyn Reach, MD       Or  . ondansetron (ZOFRAN) injection 4 mg  4 mg Intravenous Q6H PRN Gala Romney L, MD      . pantoprazole (PROTONIX) EC tablet 40 mg  40 mg Oral Daily Elwyn Reach, MD   40 mg at 05/27/18 0832  . polyvinyl alcohol (LIQUIFILM TEARS) 1.4 % ophthalmic solution 2 drop  2 drop Both Eyes BID Elwyn Reach, MD   2 drop at 05/27/18 0846  . senna-docusate (Senokot-S) tablet 2 tablet  2 tablet Oral BID Emokpae, Courage, MD      . simethicone (MYLICON) chewable tablet 160 mg  160 mg Oral TID PRN Gala Romney L, MD      . sodium chloride flush (NS) 0.9 % injection 3 mL  3 mL Intravenous Q12H Elwyn Reach, MD   3 mL at 05/27/18 2309  . sodium chloride flush (NS) 0.9 % injection 3 mL  3 mL Intravenous PRN Garba, Mohammad L, MD      . sorbitol 70 % solution 30 mL  30 mL Oral PRN Jonelle Sidle, Mohammad L, MD      . zolpidem (AMBIEN) tablet 5 mg  5 mg Oral QHS PRN Elwyn Reach, MD         Discharge Medications: Please see discharge summary for a list of discharge medications.  Relevant Imaging Results:  Relevant Lab  Results:   Additional Information SSN: [id number]  Alberteen Sam, LCSW

## 2018-05-29 DIAGNOSIS — Z515 Encounter for palliative care: Secondary | ICD-10-CM

## 2018-05-29 DIAGNOSIS — Z7189 Other specified counseling: Secondary | ICD-10-CM

## 2018-05-29 LAB — RENAL FUNCTION PANEL
Albumin: 1.7 g/dL — ABNORMAL LOW (ref 3.5–5.0)
Anion gap: 13 (ref 5–15)
BUN: 82 mg/dL — ABNORMAL HIGH (ref 6–20)
CALCIUM: 8.1 mg/dL — AB (ref 8.9–10.3)
CO2: 22 mmol/L (ref 22–32)
Chloride: 102 mmol/L (ref 98–111)
Creatinine, Ser: 5.17 mg/dL — ABNORMAL HIGH (ref 0.61–1.24)
GFR calc Af Amer: 13 mL/min — ABNORMAL LOW (ref >60)
GFR calc non Af Amer: 11 mL/min — ABNORMAL LOW (ref >60)
Glucose, Bld: 54 mg/dL — ABNORMAL LOW (ref 70–99)
PHOSPHORUS: 4.6 mg/dL (ref 2.5–4.6)
Potassium: 3.8 mmol/L (ref 3.5–5.1)
Sodium: 137 mmol/L (ref 135–145)

## 2018-05-29 LAB — GLUCOSE, CAPILLARY
GLUCOSE-CAPILLARY: 60 mg/dL — AB (ref 70–99)
Glucose-Capillary: 49 mg/dL — ABNORMAL LOW (ref 70–99)
Glucose-Capillary: 70 mg/dL (ref 70–99)
Glucose-Capillary: 113 mg/dL — ABNORMAL HIGH (ref 70–99)
Glucose-Capillary: 72 mg/dL (ref 70–99)
Glucose-Capillary: 93 mg/dL (ref 70–99)
Glucose-Capillary: 49 mg/dL — ABNORMAL LOW (ref 70–99)
Glucose-Capillary: 111 mg/dL — ABNORMAL HIGH (ref 70–99)

## 2018-05-29 MED ORDER — INSULIN GLARGINE 100 UNIT/ML ~~LOC~~ SOLN
40.0000 [IU] | Freq: Every day | SUBCUTANEOUS | Status: DC
  Filled 2018-05-29: qty 0.4

## 2018-05-29 NOTE — Progress Notes (Signed)
[facility]    NEPHROLOGY PROGRESS NOTE  SUBJECTIVE: Patient with no acute complaints today other than intermittent nausea.  Denies chest pain, shortness of breath, fevers, chills, or other symptoms.   OBJECTIVE:  Vitals:   05/29/18 1424 05/29/18 1445  BP: 138/62   Pulse:    Resp:    Temp:  (!) 97.5 F (36.4 C)  SpO2:  98%    Intake/Output Summary (Last 24 hours) at 05/29/2018 1628 Last data filed at 05/29/2018 1300 Gross per 24 hour  Intake 600 ml  Output 550 ml  Net 50 ml      [place]l: Alert to self, answers in one-word answers NAD HEENT: MMM [place] AT anicteric sclera Neck:  No JVD, no adenopathy CV:  Heart RRR  Lungs:  L/S CTA bilaterally Abd:  abd SNT/ND with normal BS GU:  Bladder non-palpable Extremities:  No LE edema. Skin: Multiple excoriations all over arms and legs.  MEDICATIONS:  . aspirin  81 mg Oral Daily  . atorvastatin  80 mg Oral q1800  . buPROPion  150 mg Oral BID  . carvedilol  12.5 mg Oral BID WC  . clopidogrel  75 mg Oral Daily  . [START ON 06/04/2018] darbepoetin (ARANESP) injection - NON-DIALYSIS  150 mcg Subcutaneous Q Sat-1800  . escitalopram  10 mg Oral Daily  . feeding supplement (GLUCERNA SHAKE)  237 mL Oral BID  . ferrous sulfate  325 mg Oral Q breakfast  . furosemide  40 mg Intravenous BID  . hydrALAZINE  50 mg Oral Q8H  . insulin aspart  0-5 Units Subcutaneous QHS  . insulin aspart  0-9 Units Subcutaneous TID WC  . insulin glargine  40 Units Subcutaneous QHS  . isosorbide mononitrate  60 mg Oral Daily  . nicotine  21 mg Transdermal Daily  . pantoprazole  40 mg Oral Daily  . polyvinyl alcohol  2 drop Both Eyes BID  . senna-docusate  2 tablet Oral BID  . sodium chloride flush  3 mL Intravenous Q12H       LABS:   CBC Latest Ref Rng & Units 05/28/2018 05/27/2018 05/26/2018  WBC 4.0 - 10.5 K/uL 11.4(H) 15.3(H) 13.0(H)  Hemoglobin 13.0 - 17.0 g/dL 7.8(L) 8.6(L) 8.6(L)  Hematocrit 39.0 - 52.0 % 23.1(L) 26.6(L) 27.4(L)   Platelets 150 - 400 K/uL 276 307 328    CMP Latest Ref Rng & Units 05/29/2018 05/28/2018 05/27/2018  Glucose 70 - 99 mg/dL 54(L) 76 69(L)  BUN 6 - 20 mg/dL 82(H) 84(H) 77(H)  Creatinine 0.61 - 1.24 mg/dL 5.17(H) 5.21(H) 4.92(H)  Sodium 135 - 145 mmol/L 137 135 134(L)  Potassium 3.5 - 5.1 mmol/L 3.8 3.1(L) 3.3(L)  Chloride 98 - 111 mmol/L 102 100 97(L)  CO2 22 - 32 mmol/L 22 22 21(L)  Calcium 8.9 - 10.3 mg/dL 8.1(L) 7.9(L) 8.2(L)  Total Protein 6.5 - 8.1 g/dL - - -  Total Bilirubin 0.3 - 1.2 mg/dL - - -  Alkaline Phos 38 - 126 U/L - - -  AST 15 - 41 U/L - - -  ALT 0 - 44 U/L - - -    Lab Results  Component Value Date   CALCIUM 8.1 (L) 05/29/2018   CAION 1.15 05/11/2018   PHOS 4.6 05/29/2018       Component Value Date/Time   COLORURINE AMBER (A) 05/27/2018 2020   APPEARANCEUR CLOUDY (A) 05/27/2018 2020   LABSPEC 1.018 05/27/2018 2020   PHURINE 5.0 05/27/2018 2020   GLUCOSEU NEGATIVE 05/27/2018 2020  HGBUR LARGE (A) 05/27/2018 2020   BILIRUBINUR NEGATIVE 05/27/2018 2020   KETONESUR NEGATIVE 05/27/2018 2020   PROTEINUR >=300 (A) 05/27/2018 2020   UROBILINOGEN 0.2 09/19/2010 0945   NITRITE NEGATIVE 05/27/2018 2020   LEUKOCYTESUR NEGATIVE 05/27/2018 2020      Component Value Date/Time   PHART 7.359 05/11/2018 2054   PCO2ART 39.3 05/11/2018 2054   PO2ART 56.0 (L) 05/11/2018 2054   HCO3 22.2 05/11/2018 2054   TCO2 23 05/11/2018 2054   ACIDBASEDEF 3.0 (H) 05/11/2018 2054   O2SAT 88.0 05/11/2018 2054      Component Value Date/Time   IRON 16 (L) 05/28/2018 0518   TIBC 127 (L) 05/28/2018 0518   FERRITIN 92 05/28/2018 0518   IRONPCTSAT 13 (L) 05/28/2018 0518   IRONPCTSAT 23 05/25/2017 1612       ASSESSMENT/PLAN:     1. Chronic kidney disease stage III with a baseline serum creatinine around 1.6 from last year.  He has had progressive worsening of his renal disease over the past couple of months.  He does have underlying hematuria and proteinuria.  He also has a  longstanding history of hypertension and diabetes.  Given his hematuria, proteinuria, and rapid progression of renal dysfunction, I suspect he has some underlying glomerular nephritis, particularly with positive proteinase 3 antibody..  Discussed with the patient sister, who states she is the POA.  She does not want dialysis or aggressive work-up.  Is interested in hospice.  We will hold off on any additional work-up.  2.  Hypertension.  Blood pressures are currently stable.  3.  Shortness of breath.  Oxygenation is stable on room air.  Suspect anemia is contributing.  4.  Anemia.  Continue ESA.  Iron studies are poor.  We will add ferrous sulfate.  Family is requesting hospice.  Will be available as needed.  Please call if status changes to full treatment.  Patient is not a dialysis candidate.  Agree with family's decision for hospice.    [place], DO, [place]

## 2018-05-29 NOTE — Progress Notes (Addendum)
Patient Demographics:    William [name], is a 58 y.o. male, DOB - 06/05/1960, ZOX:[id number]  Admit date - 05/26/2018   Admitting Physician Elwyn Reach, MD  Outpatient Primary MD for the patient is Marin Olp, MD  LOS - 3   Chief Complaint  Patient presents with  . Shortness of Breath        Subjective:    William [name] today has no fevers, no emesis,  No chest pain, poor oral intake, d/w sister (POA) William [name], patient had another episode of hypoglycemia, Lantus insulin has been reduced for the  Assessment  & Plan :    Principal Problem:   Acute respiratory failure with hypoxemia [facility]) Active Problems:   Type II diabetes mellitus with renal manifestations ([place])   Hyperlipidemia   Tobacco use disorder   Hypertension associated with diabetes ([place])   Stage 3 chronic renal impairment associated with type 2 diabetes mellitus (HCC)   Pulmonary edema   Acute on chronic combined systolic and diastolic CHF (congestive heart failure) ([place])   Palliative care by specialist   Goals of care, counseling/discussion  Brief Summary 58 y.o. male w/ former smoker with presumed ICM (EF 30-35%), HTN, poorly controlled DM, HTN, prior CVA resulting in LE weakness, cognitive impairment (resides in SNF), CKD IV, mostly bedbound/wheelchair bound, recent NSTEMI admitted to 07/08/2018 with cough, shortness of breath and new onset hypoxia. VQ scan low probability for PE. [name] (POA) William [name] is requesting palliative care consultation, palliative care consult appreciated   Plan:- 1)New onset hypoxia/acute hypoxic respiratory failure--- ???  Etiology, clinically and by imaging findings no evidence of pneumonia, VQ scan with low possibility for PE,, clinically suspect CHF exacerbation, stop IV heparin given VQ scan findings, hypoxia is resolving  2)HFrEF--- patient with history of combined  diastolic and systolic dysfunction CHF with presumed ICM, last known EF 30 to 35 %, BNP is 480, troponin 0 0.28 , cardiology consult appreciated, per nephrology and cardiology services okay to treat with Lasix , ontinue IV Lasix until transfer back to SNF  continue Coreg 12.5 mg twice daily and hydralazine 50 mg 3 times daily/ Imdur 60 mg daily  3)H/o CVA--- patient has residual cognitive deficits as well as difficulty with mobility related activities of daily living mostly bedbound/wheelchair bound, lives at SNF  4)AKI----acute kidney injury on CKD stage -     creatinine on admission=4.77 (up from 3.58 days prior to admission),   baseline creatinine =    , creatinine is now=      , renally adjust medications, avoid nephrotoxic agents/dehydration/hypotension, allergy consult appreciated, renal ultrasound and Foley ordered by nephrology service, urine output is poor  5)Anemia of chronic kidney disease--- ESA/Aranesp per nephrology service, evidence of ongoing bleeding , hemoglobin down to 7.8  6) tobacco abuse--- smoking cessation advised, okay to use nicotine patch  7)DM2--A1c 8.3 reflecting poor diabetic control, patient with recurrent episodes of hypoglycemia due to poor oral intake, so decrease Lantus insulin to 40 units from 50 (was previously on 62 units)  nightly along with sliding scale coverage, STOP Tradjenta 5 mg daily  8)H/o CAD/ICM--- last known EF 30 to 35%, patient had an STEMI in January 2020, continue aspirin 81 mg daily,  Lipitor 80 mg every afternoon, Coreg 12.5 mg twice daily and Plavix 75 mg daily, continue isosorbide 60 mg daily, continue IV Lasix until transfer back to SNF  9)Social/Ethics---  d/w sister (POA) William [name], patient is a DNR/DNI, has renal and cardiac failure, as well as worsening anemia, goals of care discussed with POA,, at this time they declined aggressive treatment protocols--- palliative care consult appreciated---SUMMARY OF RECOMMENDATIONS: 1. DNR  DNI 2. Recommend back to [facility] with palliative care, monitor disease trajectory and will need to be enrolled in hospice services soon, depending on his renal function and his overall health.  3. Continue efforts at diuresis and other current treatment.   Disposition/Need for in-Hospital Stay- patient unable to be discharged at this time due worsening renal failure and worsening CHF requiring IV diuresis----eventual plan is to transfer back to SNF with palliative care involvement  Code Status : DNR  Family Communication:   Niece at bedside  Disposition Plan  : SNF  Consults  :  Cardiology/Nephrology/palliative care  DVT Prophylaxis  :   x - Heparin -   Lab Results  Component Value Date   PLT 276 05/28/2018    Inpatient Medications  Scheduled Meds: . aspirin  81 mg Oral Daily  . atorvastatin  80 mg Oral q1800  . buPROPion  150 mg Oral BID  . carvedilol  12.5 mg Oral BID WC  . clopidogrel  75 mg Oral Daily  . [START ON 06/04/2018] darbepoetin (ARANESP) injection - NON-DIALYSIS  150 mcg Subcutaneous Q Sat-1800  . escitalopram  10 mg Oral Daily  . feeding supplement (GLUCERNA SHAKE)  237 mL Oral BID  . ferrous sulfate  325 mg Oral Q breakfast  . furosemide  40 mg Intravenous BID  . hydrALAZINE  50 mg Oral Q8H  . insulin aspart  0-5 Units Subcutaneous QHS  . insulin aspart  0-9 Units Subcutaneous TID WC  . insulin glargine  40 Units Subcutaneous QHS  . isosorbide mononitrate  60 mg Oral Daily  . nicotine  21 mg Transdermal Daily  . pantoprazole  40 mg Oral Daily  . polyvinyl alcohol  2 drop Both Eyes BID  . senna-docusate  2 tablet Oral BID  . sodium chloride flush  3 mL Intravenous Q12H   Continuous Infusions: . sodium chloride     PRN Meds:.sodium chloride, acetaminophen **OR** acetaminophen, acetaminophen, calcium carbonate (dosed in mg elemental calcium), camphor-menthol **AND** hydrOXYzine, docusate sodium, feeding supplement (NEPRO CARB STEADY),  hydrOXYzine, ondansetron **OR** ondansetron (ZOFRAN) IV, simethicone, sodium chloride flush, sorbitol, zolpidem    Anti-infectives (From admission, onward)   None        Objective:   Vitals:   05/29/18 0504 05/29/18 0839 05/29/18 1424 05/29/18 1445  BP: 137/70 137/77 138/62   Pulse: 72     Resp: 15     Temp: 98.7 F (37.1 C)   (!) 97.5 F (36.4 C)  TempSrc: Axillary   Axillary  SpO2: 93%   98%  Weight: 78.6 kg     Height:        Wt Readings from Last 3 Encounters:  05/29/18 78.6 kg  05/17/18 78.2 kg  02/04/18 80.3 kg     Intake/Output Summary (Last 24 hours) at 05/29/2018 1607 Last data filed at 05/29/2018 1300 Gross per 24 hour  Intake 600 ml  Output 550 ml  Net 50 ml     Physical Exam Patient is examined daily including today on 05/29/18 , exams remain the  same as of yesterday except that has changed   Gen:- Awake Alert, in no acute distress HEENT:- [place].AT, No sclera icterus Nose- [place] 1 L/min Neck-Supple Neck.  Lungs-diminished in bases with bibasilar rales CV- S1, S2 normal, regular  Abd-  +ve B.Sounds, Abd Soft, No tenderness,    Extremity:- +ve edema, pedal pulses present  Psych-affect is flat, some cognitive deficits Neuro-left-sided hemiparesis  GU--- condom cath with less dark urine Skin--- excoriated areas on upper back, back of neck and lower extremities  Data Review:   Micro Results No results found for this or any previous visit (from the past 240 hour(s)).  Radiology Reports Ct Chest Wo Contrast  Result Date: 05/11/2018 CLINICAL DATA:  58 year old male with hypoxia. EXAM: CT CHEST WITHOUT CONTRAST TECHNIQUE: Multidetector CT imaging of the chest was performed following the standard protocol without IV contrast. COMPARISON:  Chest radiograph dated 05/11/2018 FINDINGS: Evaluation of this exam is limited in the absence of intravenous contrast. Cardiovascular: There is mild cardiomegaly. No significant pericardial effusion. There is multi vessel  coronary vascular calcification. There is hypoattenuation of the cardiac blood pool suggestive of a degree of anemia. Clinical correlation is recommended. There is moderate atherosclerotic calcification of the thoracic aorta. The central pulmonary arteries are grossly unremarkable. Mediastinum/Nodes: Top-normal paratracheal lymph nodes measure up to 10 mm in short axis. Subcarinal lymph node measures 17 mm in short axis. The esophagus is grossly unremarkable. No mediastinal fluid collection. Lungs/Pleura: There are small bilateral pleural effusions. There is diffuse interstitial and interlobular septal prominence with Kerley B-lines as well as patchy areas of central and perihilar ground-glass density. Findings most consistent with pulmonary edema with probable superimposed pneumonia. Clinical correlation is recommended. There is no pneumothorax. The central airways are patent. Upper Abdomen: There are stones within the gallbladder. There is slight irregularity of the liver contour which may represent early changes of cirrhosis. Clinical correlation is recommended. Musculoskeletal: Degenerative changes of the spine. No acute osseous pathology. IMPRESSION: 1. Findings most consistent with pulmonary edema with probable superimposed pneumonia. Clinical correlation is recommended. 2. Small bilateral pleural effusions. 3. Cholelithiasis. Electronically Signed   By: Anner Crete M.D.   On: 05/11/2018 19:59   US Renal  Result Date: 05/12/2018 CLINICAL DATA:  Renal injury. EXAM: RENAL / URINARY TRACT ULTRASOUND COMPLETE COMPARISON:  No prior. FINDINGS: Right Kidney: Renal measurements: 10.7 x 5.7 x 5.3 cm = volume: 168.2 mL. Increased echogenicity. No mass or hydronephrosis visualized. Left Kidney: Renal measurements: 10.6 x 7.0 x 6.7 cm = volume: 258.0 mL. Increased echogenicity. No mass or hydronephrosis visualized. Bladder: Foley catheter in the bladder. Right pleural effusion incidentally noted. IMPRESSION: 1.  Increased renal echogenicity noted bilaterally. These findings consistent chronic medical renal disease. No acute abnormality identified. No hydronephrosis or bladder distention. 2.  Right pleural effusion is incidentally noted. Electronically Signed   By: Marcello Moores  Register   On: 05/12/2018 11:40   Nm Pulmonary Perf And Vent  Result Date: 05/27/2018 CLINICAL DATA:  Respiratory failure EXAM: NUCLEAR MEDICINE VENTILATION - PERFUSION LUNG SCAN VIEWS: Anterior, posterior, left lateral, right lateral, RPO, LPO, RAO, LAO-ventilation and perfusion RADIOPHARMACEUTICALS:  32.3 mCi of Tc-79m DTPA aerosol inhalation and 4.30 mCi Tc58m MAA IV COMPARISON:  Chest radiograph May 26, 2018 FINDINGS: Ventilation: Radiotracer uptake is homogeneous and symmetric bilaterally. No ventilation defects evident. Perfusion: Radiotracer uptake is homogeneous and symmetric bilaterally. No perfusion defects evident. IMPRESSION: No appreciable ventilation or perfusion defects. Very low probability of pulmonary embolus. Electronically Signed   By: Gwyndolyn Saxon  Jasmine December III M.D.   On: 05/27/2018 13:17   Dg Chest Port 1 View  Result Date: 05/26/2018 CLINICAL DATA:  Shortness of breath. EXAM: PORTABLE CHEST 1 VIEW COMPARISON:  05/13/2018. FINDINGS: Mildly enlarged cardiac silhouette with a mild increase in size. Resolved bilateral airspace opacity. Residual interstitial prominence with [employer]. No visible pleural fluid. The right lateral costophrenic angle is not included. The lungs are mildly hyperexpanded. Thoracic spine degenerative changes. IMPRESSION: Improving changes of congestive heart failure superimposed on COPD with residual interstitial pulmonary edema. Electronically Signed   By: Claudie Revering M.D.   On: 05/26/2018 20:26   Dg Chest Port 1 View  Result Date: 05/13/2018 CLINICAL DATA:  Pulmonary edema short of breath EXAM: PORTABLE CHEST 1 VIEW COMPARISON:  05/12/2018 FINDINGS: Progression of by lateral airspace disease which  is perihilar in location. No effusion. Heart size upper normal. IMPRESSION: Progression of bilateral airspace disease compatible with edema. Electronically Signed   By: Franchot Gallo M.D.   On: 05/13/2018 07:48   Dg Chest Port 1 View  Result Date: 05/12/2018 CLINICAL DATA:  Shortness of breath.  Respiratory failure. EXAM: PORTABLE CHEST 1 VIEW COMPARISON:  May 11, 2018 FINDINGS: No pneumothorax. Increasing bilateral pulmonary opacities, particularly in the bases. Diffuse interstitial prominence. The heart size is borderline. No other interval changes. IMPRESSION: 1. Worsening of bilateral pulmonary opacities, suggested to represent pneumonia superimposed on a background of edema on the CT scan from May 11, 2018. Recommend clinical correlation and follow-up to resolution. Electronically Signed   By: Dorise Bullion III M.D   On: 05/12/2018 08:34   Dg Chest Port 1 View  Result Date: 05/11/2018 CLINICAL DATA:  Shortness of breath, weakness, and vomiting. EXAM: PORTABLE CHEST 1 VIEW COMPARISON:  05/25/2017 FINDINGS: Mild cardiomegaly. Moderate diffuse interstitial infiltrates with Kerley B-lines, consistent with diffuse interstitial edema. No evidence of focal consolidation or significant pleural effusion. IMPRESSION: Cardiomegaly and diffuse interstitial edema, consistent with congestive heart failure. Electronically Signed   By: Earle Gell M.D.   On: 05/11/2018 18:47   Vas [place] Lower Extremity Venous (dvt)  Result Date: 05/12/2018  Lower Venous Study Indications: SOB.  Performing Technologist: Toma Copier RVS  Examination Guidelines: A complete evaluation includes B-mode imaging, spectral Doppler, color Doppler, and power Doppler as needed of all accessible portions of each vessel. Bilateral testing is considered an integral part of a complete examination. Limited examinations for reoccurring indications may be performed as noted.  Right Venous Findings:  +---------+---------------+---------+-----------+----------+-------+          CompressibilityPhasicitySpontaneityPropertiesSummary +---------+---------------+---------+-----------+----------+-------+ CFV      Full           Yes      Yes                          +---------+---------------+---------+-----------+----------+-------+ SFJ      Full                                                 +---------+---------------+---------+-----------+----------+-------+ FV Prox  Full           Yes      Yes                          +---------+---------------+---------+-----------+----------+-------+ FV Mid   Full                                                 +---------+---------------+---------+-----------+----------+-------+  FV DistalFull           Yes      Yes                          +---------+---------------+---------+-----------+----------+-------+ PFV      Full           Yes      Yes                          +---------+---------------+---------+-----------+----------+-------+ POP      Full           Yes      Yes                          +---------+---------------+---------+-----------+----------+-------+ PTV      Full                                                 +---------+---------------+---------+-----------+----------+-------+ PERO     Full                                                 +---------+---------------+---------+-----------+----------+-------+ Mild technical difficulty due to involuntary jerking of the legs  Left Venous Findings: +---------+---------------+---------+-----------+----------+-------+          CompressibilityPhasicitySpontaneityPropertiesSummary +---------+---------------+---------+-----------+----------+-------+ CFV      Full           Yes      Yes                          +---------+---------------+---------+-----------+----------+-------+ SFJ      Full                                                  +---------+---------------+---------+-----------+----------+-------+ FV Prox  Full           Yes      Yes                          +---------+---------------+---------+-----------+----------+-------+ FV Mid   Full                                                 +---------+---------------+---------+-----------+----------+-------+ FV DistalFull           Yes      Yes                          +---------+---------------+---------+-----------+----------+-------+ PFV      Full           Yes      Yes                          +---------+---------------+---------+-----------+----------+-------+ POP      Full  Yes      Yes                          +---------+---------------+---------+-----------+----------+-------+ PTV      Full                                                 +---------+---------------+---------+-----------+----------+-------+ PERO     Full                                                 +---------+---------------+---------+-----------+----------+-------+ Mild technical difficulty due to involuntary jerking of the leg    Summary: Right: There is no evidence of deep vein thrombosis in the lower extremity. No cystic structure found in the popliteal fossa. Left: There is no evidence of deep vein thrombosis in the lower extremity. No cystic structure found in the popliteal fossa.  *See table(s) above for measurements and observations. Electronically signed by Monica Martinez MD on 05/12/2018 at 7:25:49 PM.    Final      CBC Recent Labs  Lab 05/26/18 1947 05/27/18 0621 05/28/18 0518  WBC 13.0* 15.3* 11.4*  HGB 8.6* 8.6* 7.8*  HCT 27.4* 26.6* 23.1*  PLT 328 307 276  MCV 87.8 85.5 85.9  MCH 27.6 27.7 29.0  MCHC 31.4 32.3 33.8  RDW 13.6 13.5 13.7  LYMPHSABS 1.1  --   --   MONOABS 1.1*  --   --   EOSABS 0.2  --   --   BASOSABS 0.1  --   --     Chemistries  Recent Labs  Lab 05/26/18 1947 05/27/18 0621 05/28/18 0518 05/29/18 0330    NA 134* 134* 135 137  K 3.6 3.3* 3.1* 3.8  CL 98 97* 100 102  CO2 22 21* 22 22  GLUCOSE 177* 69* 76 54*  BUN 74* 77* 84* 82*  CREATININE 4.77* 4.92* 5.21* 5.17*  CALCIUM 7.9* 8.2* 7.9* 8.1*  AST 21  --   --   --   ALT 22  --   --   --   ALKPHOS 72  --   --   --   BILITOT 0.7  --   --   --    ------------------------------------------------------------------------------------------------------------------ No results for input(s): CHOL, HDL, LDLCALC, TRIG, CHOLHDL, LDLDIRECT in the last 72 hours.  Lab Results  Component Value Date   HGBA1C 8.3 (H) 05/12/2018   ------------------------------------------------------------------------------------------------------------------ Recent Labs    05/28/18 0518  TSH 8.783*   ------------------------------------------------------------------------------------------------------------------ Recent Labs    05/28/18 0518  FERRITIN 92  TIBC 127*  IRON 16*    Coagulation profile No results for input(s): INR, PROTIME in the last 168 hours.  Recent Labs    05/26/18 1947  DDIMER 6.63*    Cardiac Enzymes No results for input(s): CKMB, TROPONINI, MYOGLOBIN in the last 168 hours.  Invalid input(s): CK ------------------------------------------------------------------------------------------------------------------    Component Value Date/Time   BNP 490.8 (H) 05/26/2018 1948     Roxan Hockey M.D on 05/29/2018 at 4:07 PM  Go to [email or web address] - for contact info  [facility] - Office  [phone]

## 2018-05-29 NOTE — Progress Notes (Signed)
CRITICAL VALUE ALERT  Critical Value:  CBG 49  Date & Time Notied:  05/29/18 6:07 am  Provider Notified:   Orders Received/Actions taken: Gave 8 ounces of orange juice.  Rechecked in 15 minutes and CBG is 70

## 2018-05-29 NOTE — Progress Notes (Signed)
Hypoglycemic Event  CBG: 49  Treatment: 4 oz juice/soda (patient only drank 4 oz but offered 8 oz.)  Symptoms: None  Follow-up CBG: Time: 2249 CBG Result:60  Possible Reasons for Event: Inadequate meal intake  Comments/MD notified:  [place], NP (on call with [facility]) notified, stated do not give scheduled Lantus this evening.    Sallee Lange, [place]

## 2018-05-29 NOTE — Progress Notes (Signed)
Hypoglycemic Event  CBG: 60  Treatment: 4 oz juice/soda  Symptoms: None  Follow-up CBG: Time: 2317 CBG Result: 111  Possible Reasons for Event: Inadequate meal intake  Comments/MD notified: previously spoke with on call for [facility], Silas [place], NP    Sallee Lange, [place]

## 2018-05-29 NOTE — Consult Note (Signed)
Consultation Note Date: 05/29/2018   Patient Name: William [name]  DOB: October 09, 1960  MRN: [id number]  Age / Sex: 58 y.o., male  PCP: Marin Olp, MD Referring Physician: Roxan Hockey, MD  Reason for Consultation: Establishing goals of care  HPI/Patient Profile: 58 y.o. male     admitted on 05/26/2018  57 y.o.malew/former smokerwith presumedICM (EF 30-35%), HTN, poorly controlled DM, HTN, prior CVA resulting in LE weakness, cognitive impairment(resides in SNF), CKD IV, mostly bedbound/wheelchair bound, recent NSTEMI admitted to 07/08/2018 with cough, shortness of breath and new onset hypoxia. VQ scan low probability for PE. [name] (POA) [name] has requested a palliative care consultation.  Clinical Assessment and Goals of Care:  Patient is resting in bed. He has eaten only a few bites of his breakfast. Although he is able to answer a few questions, he is not able to provide all answers during history taking. There is no family at the bedside.   I introduced myself and palliative care as follows: Palliative medicine is specialized medical care for people living with serious illness. It focuses on providing relief from the symptoms and stress of a serious illness. The goal is to improve quality of life for both the patient and the family.  The patient states that he lives in a nursing home. He states that he has had a stroke and a heart attack. He has a MOST form indicating DNR status in the room.   Call placed and discussed with sister [name] in detail. She is the patient's next of kin and medical decision maker. She states that the patient has been at [facility] facility since the past year. He has made some friends there and she would like to keep him there for as long as possible.   We talked about the patient's current condition, he has heart failure with reduced  ejection fraction, he has had a stroke and is with residual cognitive and functional deficits. More acutely, he has worsening AKI in addition to his underlying CKD.   Goals, wishes and values attempted to be explored, DNR DNI, no feeding tube, no dialysis discussed in detail and agreed upon.   Discussed about differences between hospice and palliative care. At this time, would recommend palliative services start following the patient at [facility].   Decline disease trajectory from heart failure and also from renal failure discussed with sister, she is thankful for the information she has received from other hospital providers and staff. She wishes to avoid aggressive interventions, she wishes to keep him as comfortable as possible. See below.   HCPOA  sister [name] who is his HCPOA agent.  Patient doesn't have a spouse, he doesn't have kids.   SUMMARY OF RECOMMENDATIONS: 1. DNR DNI 2. Recommend back to [facility] with palliative care, monitor disease trajectory and will need to be enrolled in hospice services soon, depending on his renal function and his overall health.  3. Continue efforts at diuresis and other current treatment.  Code Status/Advance Care Planning:  DNR    Symptom Management:      Palliative Prophylaxis:   Delirium Protocol   Psycho-social/Spiritual:   Desire for further Chaplaincy support:yes  Additional Recommendations: Education on Hospice  Prognosis:   < 6 months  Discharge Planning: [place] for rehab with Palliative care service follow-up      Primary Diagnoses: Present on Admission: . Acute respiratory failure with hypoxemia ([place]) . Type II diabetes mellitus with renal manifestations ([place]) . Tobacco use disorder . Stage 3 chronic renal impairment associated with type 2 diabetes mellitus ([place]) . Pulmonary edema . Hypertension associated with diabetes ([place]) . Hyperlipidemia   I have reviewed the  medical record, interviewed the patient and family, and examined the patient. The following aspects are pertinent.  Past Medical History:  Diagnosis Date  . CEREBROVASCULAR ACCIDENT, HX OF 09/28/2006   2007 affected left side and peripheral vision  . DIABETES MELLITUS, TYPE II 09/28/2006  . GERD (gastroesophageal reflux disease)   . HYPERLIPIDEMIA 09/28/2006  . HYPERTENSION 09/28/2006  . Learning disability   . Stroke [facility]) years ago   Social History   Socioeconomic History  . Marital status: Divorced    Spouse name: Not on file  . Number of children: Not on file  . Years of education: Not on file  . Highest education level: Not on file  Occupational History  . Not on file  Social Needs  . Financial resource strain: Not hard at all  . Food insecurity:    Worry: Never true    Inability: Never true  . Transportation needs:    Medical: No    Non-medical: No  Tobacco Use  . Smoking status: Current Every Day Smoker    Packs/day: 1.00    Types: Cigarettes  . Smokeless tobacco: Former Network engineer and Sexual Activity  . Alcohol use: No    Alcohol/week: 0.0 standard drinks  . Drug use: No  . Sexual activity: Not on file  Lifestyle  . Physical activity:    Days per week: 0 days    Minutes per session: 0 min  . Stress: Not at all  Relationships  . Social connections:    Talks on phone: Three times a week    Gets together: Three times a week    Attends religious service: Never    Active member of club or organization: No    Attends meetings of clubs or organizations: Never    Relationship status: Divorced  Other Topics Concern  . Not on file  Social History Narrative   Single. Lives alone will be living in a trailer near family friends Otho Perl and husband Renee Rival- will be staying in their trailer). They watch out for him.    Sister is also involved heavily.       Disabled after CVA, low literacy at baseline      Enjoys hunting   Family History  Problem  Relation Age of Onset  . Lung cancer Mother   . Diabetes Father   . Stroke Father   . Alcohol abuse Brother   . Diabetes Brother   . Heart attack Sister   . Colon cancer Neg Hx    Scheduled Meds: . aspirin  81 mg Oral Daily  . atorvastatin  80 mg Oral q1800  . buPROPion  150 mg Oral BID  . carvedilol  12.5 mg Oral BID WC  . clopidogrel  75 mg Oral Daily  . [START ON 06/04/2018]  darbepoetin (ARANESP) injection - NON-DIALYSIS  150 mcg Subcutaneous Q Sat-1800  . escitalopram  10 mg Oral Daily  . feeding supplement (GLUCERNA SHAKE)  237 mL Oral BID  . ferrous sulfate  325 mg Oral Q breakfast  . furosemide  40 mg Intravenous BID  . hydrALAZINE  50 mg Oral Q8H  . insulin aspart  0-5 Units Subcutaneous QHS  . insulin aspart  0-9 Units Subcutaneous TID WC  . insulin glargine  50 Units Subcutaneous QHS  . isosorbide mononitrate  60 mg Oral Daily  . nicotine  21 mg Transdermal Daily  . pantoprazole  40 mg Oral Daily  . polyvinyl alcohol  2 drop Both Eyes BID  . senna-docusate  2 tablet Oral BID  . sodium chloride flush  3 mL Intravenous Q12H   Continuous Infusions: . sodium chloride     PRN Meds:.sodium chloride, acetaminophen **OR** acetaminophen, acetaminophen, calcium carbonate (dosed in mg elemental calcium), camphor-menthol **AND** hydrOXYzine, docusate sodium, feeding supplement (NEPRO CARB STEADY), hydrOXYzine, ondansetron **OR** ondansetron (ZOFRAN) IV, simethicone, sodium chloride flush, sorbitol, zolpidem Medications Prior to Admission:  Prior to Admission medications   Medication Sig Start Date End Date Taking? Authorizing Provider  acetaminophen (TYLENOL) 325 MG tablet Take 650 mg by mouth every 6 (six) hours as needed for fever.   Yes [provider]  aspirin 81 MG chewable tablet Chew 1 tablet (81 mg total) by mouth daily. 05/19/18  Yes Kathyrn Drown D, NP  atorvastatin (LIPITOR) 80 MG tablet Take 1 tablet (80 mg total) by mouth daily at 6 PM. 05/18/18  Yes  Kathyrn Drown D, NP  buPROPion [facility] SR) 150 MG 12 hr tablet Take 150 mg by mouth 2 (two) times daily.   Yes [provider]  carvedilol (COREG) 12.5 MG tablet Take 1 tablet (12.5 mg total) by mouth 2 (two) times daily with a meal. 05/18/18  Yes Kathyrn Drown D, NP  clopidogrel (PLAVIX) 75 MG tablet Take 1 tablet (75 mg total) by mouth daily. 05/19/18  Yes Kathyrn Drown D, NP  escitalopram (LEXAPRO) 10 MG tablet Take 10 mg by mouth daily.   Yes [provider]  ferrous sulfate 325 (65 FE) MG tablet Take 1 tablet (325 mg total) by mouth 2 (two) times daily with a meal. Patient taking differently: Take 325 mg by mouth daily with breakfast.  03/24/17  Yes Marin Olp, MD  GLUCERNA [facility]) LIQD Take 237 mLs by mouth 2 (two) times daily.   Yes [provider]  hydrALAZINE (APRESOLINE) 50 MG tablet Take 1 tablet (50 mg total) by mouth every 8 (eight) hours. 05/18/18  Yes Kathyrn Drown D, NP  hydrOXYzine (ATARAX/VISTARIL) 25 MG tablet Take 1 tablet (25 mg total) by mouth every 6 (six) hours as needed for itching. 05/07/18  Yes Street, [place], PA-C  Hypromellose (ARTIFICIAL TEARS) 0.4 % SOLN Place 2 drops into both eyes 2 (two) times daily.    Yes [provider]  insulin aspart (NOVOLOG PENFILL) cartridge Inject 20-24 Units into the skin 3 (three) times daily before meals. 20 units in the morning. 24 units in the afternoon. 20 units in the evening 11/24/17  Yes [provider]  Insulin Glargine (LANTUS SOLOSTAR) 100 UNIT/ML Solostar Pen Inject 62 Units into the skin at bedtime.  11/17/17  Yes [provider]  isosorbide mononitrate (IMDUR) 60 MG 24 hr tablet Take 1 tablet (60 mg total) by mouth daily. 05/19/18  Yes Kathyrn Drown D, NP  linagliptin (TRADJENTA) 5 MG  TABS tablet Take 5 mg by mouth daily. 09/27/17  Yes [provider]  pantoprazole (PROTONIX) 40 MG tablet Take 1 tablet (40 mg total) by mouth daily. 05/19/18  Yes Kathyrn Drown D, NP  simethicone (MYLICON) 80 MG chewable tablet Chew 160 mg by mouth 3 (three) times daily as needed for flatulence.   Yes [provider]   Allergies  Allergen Reactions  . Sulfamethoxazole Rash   Review of Systems +weakness +low appetite  Physical Exam Awake alert resting in bed Denies chest pain denies shortness of breath Mostly gives 1 word answers No acute distress Clear breath sounds S1-S2 Abdomen is not tender  Vital Signs: BP 137/77   Pulse 72   Temp 98.7 F (37.1 C) (Axillary)   Resp 15   Ht 5\' 8"  (1.727 m)   Wt 78.6 kg   SpO2 93%   BMI 26.35 kg/m  Pain Scale: 0-10   Pain Score: 0-No pain   SpO2: SpO2: 93 % O2 Device:SpO2: 93 % O2 Flow Rate: .O2 Flow Rate (L/min): 3 L/min  IO: Intake/output summary:   Intake/Output Summary (Last 24 hours) at 05/29/2018 1240 Last data filed at 05/28/2018 1855 Gross per 24 hour  Intake 240 ml  Output 300 ml  Net -60 ml    LBM: Last BM Date: 05/28/18 Baseline Weight: Weight: 78.2 kg Most recent weight: Weight: 78.6 kg     Palliative Assessment/Data:   PPS 40%  Time In:  11.30 Time Out:  12.30 Time Total:   60 min  Greater than 50%  of this time was spent counseling and coordinating care related to the above assessment and plan.  Signed by: Loistine Chance, MD [phone]  Please contact Palliative Medicine Team phone at [phone] for questions and concerns.  For individual provider: See Shea Evans

## 2018-05-30 LAB — GLOMERULAR BASEMENT MEMBRANE ANTIBODIES: GBM Ab: 5 units (ref 0–20)

## 2018-05-30 LAB — CBC
HCT: 25.8 % — ABNORMAL LOW (ref 39.0–52.0)
Hemoglobin: 8.3 g/dL — ABNORMAL LOW (ref 13.0–17.0)
MCH: 28.2 pg (ref 26.0–34.0)
MCHC: 32.2 g/dL (ref 30.0–36.0)
MCV: 87.8 fL (ref 80.0–100.0)
PLATELETS: 286 10*3/uL (ref 150–400)
RBC: 2.94 MIL/uL — ABNORMAL LOW (ref 4.22–5.81)
RDW: 13.9 % (ref 11.5–15.5)
WBC: 10.5 10*3/uL (ref 4.0–10.5)
nRBC: 0 % (ref 0.0–0.2)

## 2018-05-30 LAB — RENAL FUNCTION PANEL
ALBUMIN: 1.8 g/dL — AB (ref 3.5–5.0)
Anion gap: 12 (ref 5–15)
BUN: 79 mg/dL — ABNORMAL HIGH (ref 6–20)
CO2: 23 mmol/L (ref 22–32)
Calcium: 8.1 mg/dL — ABNORMAL LOW (ref 8.9–10.3)
Chloride: 100 mmol/L (ref 98–111)
Creatinine, Ser: 5.33 mg/dL — ABNORMAL HIGH (ref 0.61–1.24)
GFR calc Af Amer: 13 mL/min — ABNORMAL LOW (ref >60)
GFR calc non Af Amer: 11 mL/min — ABNORMAL LOW (ref >60)
Glucose, Bld: 78 mg/dL (ref 70–99)
Phosphorus: 5.4 mg/dL — ABNORMAL HIGH (ref 2.5–4.6)
Potassium: 4 mmol/L (ref 3.5–5.1)
Sodium: 135 mmol/L (ref 135–145)

## 2018-05-30 LAB — ANTINUCLEAR ANTIBODIES, IFA: ANA Ab, IFA: NEGATIVE

## 2018-05-30 LAB — GLUCOSE, CAPILLARY
Glucose-Capillary: 73 mg/dL (ref 70–99)
Glucose-Capillary: 92 mg/dL (ref 70–99)
Glucose-Capillary: 69 mg/dL — ABNORMAL LOW (ref 70–99)
Glucose-Capillary: 85 mg/dL (ref 70–99)

## 2018-05-30 MED ORDER — INSULIN GLARGINE 100 UNIT/ML ~~LOC~~ SOLN
30.0000 [IU] | Freq: Every day | SUBCUTANEOUS | Status: DC
  Filled 2018-05-30: qty 0.3

## 2018-05-30 NOTE — Progress Notes (Signed)
PMT progress note  Patient resting in bed, denies any chest pain, denies shortness of breath. In no distress, had some breakfast.   No family at bedside, sister visited over the weekend and dropped off some soup for the patient.   BP (!) 132/57   Pulse 72   Temp 98.1 F (36.7 C) (Oral)   Resp (!) 22   Ht 5\' 8"  (1.727 m)   Wt 80.3 kg   SpO2 99%   BMI 26.93 kg/m  Labs and imaging noted Chart reviewed.   PPS 40%  AKI with worsening III CKD Heart failure with reduced EF Excoriations on both UE and LE, in dressings noted.  History of stroke and CKD Generalized deconditioning.   Continue current mode of care PT note reviewed Recommend SNF with palliative care, may need full hospice enrollment depending on his disease trajectory.   25 minute spent.  Loistine Chance MD [place] palliative medicine team [phone]

## 2018-05-30 NOTE — Progress Notes (Addendum)
NEPHROLOGY PROGRESS NOTE  SUBJECTIVE:  No complaints. Feeling ok.  Denies headaches, fevers, chills, chest pain, shortness of breath, nausea, vomiting, diarrhea or dysuria.  All other review of systems are negative.   OBJECTIVE:  Vitals:   05/30/18 0700 05/30/18 1037  BP: (!) 116/100 (!) 166/77  Pulse: 71   Resp: 17   Temp: 97.9 F (36.6 C)   SpO2: 98%     Intake/Output Summary (Last 24 hours) at 05/30/2018 1042 Last data filed at 05/29/2018 2257 Gross per 24 hour  Intake 363 ml  Output 800 ml  Net -437 ml      General: alert to self, no acute distress, resting comfortably CV:  Rrr, no m/r/g. No BLE edema Lungs:  Lungs ctab, no accessory muscle use Abd:  abd SNT/ND with normal BS Extremities:  No LE edema. Skin: Multiple excoriations all over arms and legs.  MEDICATIONS:  . aspirin  81 mg Oral Daily  . atorvastatin  80 mg Oral q1800  . buPROPion  150 mg Oral BID  . carvedilol  12.5 mg Oral BID WC  . clopidogrel  75 mg Oral Daily  . [START ON 06/04/2018] darbepoetin (ARANESP) injection - NON-DIALYSIS  150 mcg Subcutaneous Q Sat-1800  . escitalopram  10 mg Oral Daily  . feeding supplement (GLUCERNA SHAKE)  237 mL Oral BID  . ferrous sulfate  325 mg Oral Q breakfast  . furosemide  40 mg Intravenous BID  . hydrALAZINE  50 mg Oral Q8H  . insulin aspart  0-5 Units Subcutaneous QHS  . insulin aspart  0-9 Units Subcutaneous TID WC  . insulin glargine  30 Units Subcutaneous QHS  . isosorbide mononitrate  60 mg Oral Daily  . nicotine  21 mg Transdermal Daily  . pantoprazole  40 mg Oral Daily  . polyvinyl alcohol  2 drop Both Eyes BID  . senna-docusate  2 tablet Oral BID  . sodium chloride flush  3 mL Intravenous Q12H       LABS:   CBC Latest Ref Rng & Units 05/30/2018 05/28/2018 05/27/2018  WBC 4.0 - 10.5 K/uL 10.5 11.4(H) 15.3(H)  Hemoglobin 13.0 - 17.0 g/dL 8.3(L) 7.8(L) 8.6(L)  Hematocrit 39.0 - 52.0 % 25.8(L) 23.1(L) 26.6(L)  Platelets 150 - 400 K/uL 286 276 307     CMP Latest Ref Rng & Units 05/30/2018 05/29/2018 05/28/2018  Glucose 70 - 99 mg/dL 78 54(L) 76  BUN 6 - 20 mg/dL 79(H) 82(H) 84(H)  Creatinine 0.61 - 1.24 mg/dL 5.33(H) 5.17(H) 5.21(H)  Sodium 135 - 145 mmol/L 135 137 135  Potassium 3.5 - 5.1 mmol/L 4.0 3.8 3.1(L)  Chloride 98 - 111 mmol/L 100 102 100  CO2 22 - 32 mmol/L 23 22 22   Calcium 8.9 - 10.3 mg/dL 8.1(L) 8.1(L) 7.9(L)  Total Protein 6.5 - 8.1 g/dL - - -  Total Bilirubin 0.3 - 1.2 mg/dL - - -  Alkaline Phos 38 - 126 U/L - - -  AST 15 - 41 U/L - - -  ALT 0 - 44 U/L - - -    Lab Results  Component Value Date   CALCIUM 8.1 (L) 05/30/2018   CAION 1.15 05/11/2018   PHOS 5.4 (H) 05/30/2018       Component Value Date/Time   COLORURINE AMBER (A) 05/27/2018 2020   APPEARANCEUR CLOUDY (A) 05/27/2018 2020   LABSPEC 1.018 05/27/2018 2020   PHURINE 5.0 05/27/2018 2020   GLUCOSEU NEGATIVE 05/27/2018 2020   HGBUR LARGE (A) 05/27/2018 2020  BILIRUBINUR NEGATIVE 05/27/2018 2020   KETONESUR NEGATIVE 05/27/2018 2020   PROTEINUR >=300 (A) 05/27/2018 2020   UROBILINOGEN 0.2 09/19/2010 0945   NITRITE NEGATIVE 05/27/2018 2020   LEUKOCYTESUR NEGATIVE 05/27/2018 2020      Component Value Date/Time   PHART 7.359 05/11/2018 2054   PCO2ART 39.3 05/11/2018 2054   PO2ART 56.0 (L) 05/11/2018 2054   HCO3 22.2 05/11/2018 2054   TCO2 23 05/11/2018 2054   ACIDBASEDEF 3.0 (H) 05/11/2018 2054   O2SAT 88.0 05/11/2018 2054      Component Value Date/Time   IRON 16 (L) 05/28/2018 0518   TIBC 127 (L) 05/28/2018 0518   FERRITIN 92 05/28/2018 0518   IRONPCTSAT 13 (L) 05/28/2018 0518   IRONPCTSAT 23 05/25/2017 1612       ASSESSMENT/PLAN:     1. Chronic kidney disease stage III with a baseline serum creatinine around 1.6 from last year.  Rapid progressive worsening of renal disease likely multifactorial due to glomeruler nephritis, htn, and diabetes. Patient's POA does not want dialysis or aggressive work up. Per palliative recommendation likely  going back to SNF w/ palliative care with likely enrollment in hospice soon.  2.  Hypertension.  Blood pressures are currently stable. BP 166/77 at most recent check  3.  Shortness of breath.  Oxygenation is stable on room air.  Suspect anemia is contributing.  4.  Anemia.  Continue ESA.  Iron studies are poor.  We will add ferrous sulfate.   Guadalupe Dawn MD PGY-2 Family Medicine Resident   Patient seen and examined with resident at the bedside.  Agree with plan and documentation by resident.  Patient is not a candidate for further work-up or for dialysis.  Discussed with sister who is in agreement.  Agree with discharge on hospice.

## 2018-05-30 NOTE — Progress Notes (Addendum)
Physical Therapy Treatment Patient Details Name: William [name] MRN: [id number] DOB: [birth year] Today's Date: 05/30/2018    History of Present Illness Pt is a 58 y.o. male who resides at [facility] SNF admitted 05/26/18 with hypoxia and SOB. Worked up for recurrent acute hypoxic respiratory failure concerning for multifactorial process given progressive renal failure. PMH includes CVA (residual weakness and cognitive deficits), CAD, CKD, DM, HTN. Of note, recent admission 04/2018 with NSTEMI.    PT Comments    Pt admitted with above diagnosis. Pt currently with functional limitations due to the deficits listed below (see PT Problem List). Pt was able to sit EOB for 5 min with min guard assist.  Refused to get OOB to chair.  Pt needs SNF.  Will continue to follow acutely.   Pt will benefit from skilled PT to increase their independence and safety with mobility to allow discharge to the venue listed below.     Follow Up Recommendations  SNF;Supervision for mobility/OOB     Equipment Recommendations  Other (comment)(defer to SNF)    Recommendations for Other Services       Precautions / Restrictions Precautions Precautions: Fall Precaution Comments: hx of CVA, learning disability  Restrictions Weight Bearing Restrictions: No    Mobility  Bed Mobility Overal bed mobility: Needs Assistance Bed Mobility: Supine to Sit;Sit to Supine Rolling: Min guard Sidelying to sit: Mod assist;HOB elevated Supine to sit: Mod assist Sit to supine: Supervision   General bed mobility comments: ModA for UE support to assist trunk elevation; able to return to supine with supervision.  Pt was on bedpan on arrival.  Pt able to roll and PT cleaned wtih total assist.   Transfers                 General transfer comment: refused to stand today.  Ambulation/Gait             General Gait Details: Does not walk at SNF. Attempted steps, but pt unable to intiate weight shifts/steps despite  max, multimodal cues and physical assist to move LE; BUE support and maxA. Endorses fear of falling   Marine scientist [place] (Stroke Patients Only)       Balance Overall balance assessment: Needs assistance Sitting-balance support: Feet supported;No upper extremity supported Sitting balance-Leahy Scale: Poor Sitting balance - Comments: No assisted needing to sit EOB once pt got balance.  LEans right initially.  Did need UE support for balance.                                     Cognition Arousal/Alertness: Awake/alert Behavior During Therapy: WFL for tasks assessed/performed Overall Cognitive Status: History of cognitive impairments - at baseline Area of Impairment: Attention;Following commands;Problem solving                 Orientation Level: Disoriented to;Time Current Attention Level: Selective   Following Commands: Follows one step commands with increased time;Follows multi-step commands inconsistently     Problem Solving: Slow processing;Requires verbal cues;Decreased initiation;Difficulty sequencing;Requires tactile cues General Comments: Increased time to answer questions at times; repetition needed to follow multi step commands with time. Able to state he was in the hospital but not the month despite contextual clues.       Exercises   LAQ x 10 while sitting EOB.  General Comments        Pertinent Vitals/Pain Pain Assessment: No/denies pain    Home Living                      Prior Function            PT Goals (current goals can now be found in the care plan section) Acute Rehab PT Goals Patient Stated Goal: Stop itching Progress towards PT goals: Progressing toward goals    Frequency    Min 2X/week      PT Plan Current plan remains appropriate    Co-evaluation              AM-PAC PT "6 Clicks" Mobility   Outcome Measure  Help needed turning from your back to  your side while in a flat bed without using bedrails?: A Little Help needed moving from lying on your back to sitting on the side of a flat bed without using bedrails?: A Lot Help needed moving to and from a bed to a chair (including a wheelchair)?: A Lot Help needed standing up from a chair using your arms (e.g., wheelchair or bedside chair)?: A Lot Help needed to walk in hospital room?: Total Help needed climbing 3-5 steps with a railing? : Total 6 Click Score: 11    End of Session Equipment Utilized During Treatment: Gait belt Activity Tolerance: Patient limited by fatigue Patient left: with call bell/phone within reach;in bed;with bed alarm set Nurse Communication: Mobility status PT Visit Diagnosis: Muscle weakness (generalized) (M62.81);Unsteadiness on feet (R26.81);Difficulty in walking, not elsewhere classified (R26.2)     Time: 9038-3338 PT Time Calculation (min) (ACUTE ONLY): 16 min  Charges:  $Therapeutic Activity: 8-22 mins                     [place] Pager:  [phone]  Office:  [place] 05/30/2018, 3:13 PM

## 2018-05-30 NOTE — Consult Note (Signed)
   [facility] CM Inpatient Consult   05/30/2018  JONATHAN CORPUS [birth year] [id number]     Patient screened for potential [facility] Care Management services due to unplanned readmission risk score of 35% (extreme).  Chart reviewed. Mr. Lacivita is a long term resident at [facility]. Palliative Medicine Team following. No identifiable [facility] Care Management needs.   Marthenia Rolling, MSN-Ed, RN,BSN [facility] Liaison [phone]

## 2018-05-30 NOTE — Clinical Social Work Note (Signed)
Clinical Social Work Assessment  Patient Details  Name: William [name] MRN: [id number] Date of Birth: 1961/01/22  Date of referral:  05/30/18               Reason for consult:  Facility Placement, Discharge Planning                Permission sought to share information with:  Facility Sport and exercise psychologist, Family Supports Permission granted to share information::  Yes, Verbal Permission Granted  Name::     William [name]  Agency::  [employer]  Relationship::  sister, Administrator, arts Information:  [phone]  Housing/Transportation Living arrangements for the past 2 months:  [place] of Information:  Patient, Siblings Patient Interpreter Needed:  None Criminal Activity/Legal Involvement Pertinent to Current Situation/Hospitalization:  No - Comment as needed Significant Relationships:  Siblings Lives with:  Facility Resident Do you feel safe going back to the place where you live?  Yes Need for family participation in patient care:  Yes (Comment)  Care giving concerns: Patient is a long term care resident at [facility]. Will still require [facility] authorization to return to SNF. Palliative following, recommending palliative to follow at the SNF and transition patient to hospice care when appropriate.   Social Worker assessment / plan: CSW met with patient at bedside. Patient alert and oriented, though engaged in limited conversation. CSW introduced self and role and discussed disposition planning. Patient agreeable to return to his SNF.   CSW also spoke to patient's sister/HCPOA, William [name], on the phone. William [name] was sad and tearful about patient's condition and plan for palliative/eventual hospice. She would like patient to return to [place] because that is his home. She is agreeable for palliative to follow at the SNF. She has a good understanding of MD and palliative recommendations.  CSW confirmed patient's bed at SNF. SNF will start patient's [facility]  authorization request today. Patient will need [employer] auth to return to the SNF. Also alerted SNF that patient will need palliative to follow.  CSW to follow and support with discharge planning.  Employment status:  Disabled (Comment on whether or not currently receiving Disability) Insurance information:  Programmer, applications, Medicaid In [facility]) PT Recommendations:  [employer] / Referral to community resources:  [place]  Patient/Family's Response to care: Patient and family appreciative of care.  Patient/Family's Understanding of and Emotional Response to Diagnosis, Current Treatment, and Prognosis: Patient and family with good understanding of patient's condition and agreeable to SNF with palliative.  Emotional Assessment Appearance:  Appears stated age Attitude/Demeanor/Rapport:  Engaged Affect (typically observed):  Accepting, Calm Orientation:  Oriented to Self, Oriented to Place, Oriented to  Time, Oriented to Situation Alcohol / Substance use:  Not Applicable Psych involvement (Current and /or in the community):  No (Comment)  Discharge Needs  Concerns to be addressed:  Discharge Planning Concerns, Care Coordination Readmission within the last 30 days:  Yes Current discharge risk:  Physical Impairment, Terminally ill Barriers to Discharge:  Continued Medical Work up, [place], [place] 05/30/2018, 10:13 AM

## 2018-05-30 NOTE — Progress Notes (Addendum)
Patient Demographics:    William [name], is a 58 y.o. male, DOB - February 12, 1961, ITG:[id number]  Admit date - 05/26/2018   Admitting Physician Elwyn Reach, MD  Outpatient Primary MD for the patient is Marin Olp, MD  LOS - 4   Chief Complaint  Patient presents with  . Shortness of Breath        Subjective:    William [name] today has no fevers, no emesis,  No chest pain, poor oral intake, d/w sister (POA) [name],    Assessment  & Plan :    Principal Problem:   Acute respiratory failure with hypoxemia [facility]) Active Problems:   Type II diabetes mellitus with renal manifestations ([place])   Hyperlipidemia   Tobacco use disorder   Hypertension associated with diabetes ([place])   Stage 3 chronic renal impairment associated with type 2 diabetes mellitus ([place])   Pulmonary edema   Acute on chronic combined systolic and diastolic CHF (congestive heart failure) ([place])   Palliative care by specialist   Goals of care, counseling/discussion  Brief Summary 58 y.o. male w/ former smoker with presumed ICM (EF 30-35%), HTN, poorly controlled DM, HTN, prior CVA resulting in LE weakness, cognitive impairment (resides in SNF), CKD IV, mostly bedbound/wheelchair bound, recent NSTEMI admitted to 07/08/2018 with cough, shortness of breath and new onset hypoxia. VQ scan low probability for PE. [name] (POA) [name] is requesting palliative care consultation, palliative care consult appreciated   Plan:- 1)New onset hypoxia/acute hypoxic respiratory failure--- ???  Etiology, clinically and by imaging findings no evidence of pneumonia, VQ scan with low possibility for PE,, clinically suspect CHF exacerbation, stop IV heparin given VQ scan findings, hypoxia is resolving/improving  2)HFrEF--- patient with history of combined diastolic and systolic dysfunction CHF with presumed ICM, last known EF 30 to  35 %, BNP is 480, troponin 0 0.28 , cardiology consult appreciated, per nephrology and cardiology services okay to treat with Lasix , c/n IV Lasix until transfer back to SNF  continue Coreg 12.5 mg twice daily and hydralazine 50 mg 3 times daily/ Imdur 60 mg daily  3)H/o CVA--- patient has residual cognitive deficits as well as difficulty with mobility related activities of daily living mostly bedbound/wheelchair bound, lives at SNF  4)AKI----acute kidney injury on CKD stage -     creatinine on admission=4.77 (up from 3.58 days prior to admission),   baseline creatinine =    , creatinine is now=      , renally adjust medications, avoid nephrotoxic agents/dehydration/hypotension, allergy consult appreciated, renal ultrasound and Foley ordered by nephrology service, urine output is poor  5)Anemia of chronic kidney disease--- ESA/Aranesp per nephrology service, evidence of ongoing bleeding , hemoglobin 8.3  6) tobacco abuse--- smoking cessation advised, okay to use nicotine patch  7)DM2--A1c 8.3 reflecting poor diabetic control, patient with recurrent episodes of hypoglycemia due to poor oral intake, continue Lantus insulin at decreased rate of 40 units   (was previously on 62 units)  nightly along with sliding scale coverage, STOP Tradjenta 5 mg daily, no further significant hypoglycemia at this time  8)H/o CAD/ICM--- last known EF 30 to 35%, patient had an STEMI in January 2020, continue aspirin 81 mg daily, Lipitor 80 mg every  afternoon, Coreg 12.5 mg twice daily and Plavix 75 mg daily, continue isosorbide 60 mg daily, continue IV Lasix until transfer back to SNF  9)Social/Ethics---  d/w sister (POA) [name], patient is a DNR/DNI, has renal and cardiac failure, as well as worsening anemia, goals of care discussed with POA,, at this time they declined aggressive treatment protocols--- palliative care consult appreciated---SUMMARY OF RECOMMENDATIONS: 1. DNR DNI 2. Recommend back to [facility] with palliative care, monitor disease trajectory and will need to be enrolled in hospice services soon, depending on his renal function and his overall health.  3. Continue efforts at diuresis and other current treatment.   Disposition/Need for in-Hospital Stay- patient unable to be discharged at this time due worsening renal failure and worsening CHF requiring IV diuresis---patient awaiting insurance approval for transfer back to SNF with palliative care involvement----  Code Status : DNR  Family Communication:   Niece at bedside  Disposition Plan  : SNF  Consults  :  Cardiology/Nephrology/palliative care  DVT Prophylaxis  :   x - Heparin -   Lab Results  Component Value Date   PLT 286 05/30/2018    Inpatient Medications  Scheduled Meds: . aspirin  81 mg Oral Daily  . atorvastatin  80 mg Oral q1800  . buPROPion  150 mg Oral BID  . carvedilol  12.5 mg Oral BID WC  . clopidogrel  75 mg Oral Daily  . [START ON 06/04/2018] darbepoetin (ARANESP) injection - NON-DIALYSIS  150 mcg Subcutaneous Q Sat-1800  . escitalopram  10 mg Oral Daily  . feeding supplement (GLUCERNA SHAKE)  237 mL Oral BID  . ferrous sulfate  325 mg Oral Q breakfast  . furosemide  40 mg Intravenous BID  . hydrALAZINE  50 mg Oral Q8H  . insulin aspart  0-5 Units Subcutaneous QHS  . insulin aspart  0-9 Units Subcutaneous TID WC  . insulin glargine  30 Units Subcutaneous QHS  . isosorbide mononitrate  60 mg Oral Daily  . nicotine  21 mg Transdermal Daily  . pantoprazole  40 mg Oral Daily  . polyvinyl alcohol  2 drop Both Eyes BID  . senna-docusate  2 tablet Oral BID  . sodium chloride flush  3 mL Intravenous Q12H   Continuous Infusions: . sodium chloride     PRN Meds:.sodium chloride, acetaminophen **OR** acetaminophen, acetaminophen, calcium carbonate (dosed in mg elemental calcium), camphor-menthol **AND** hydrOXYzine, docusate sodium, feeding supplement (NEPRO CARB STEADY), hydrOXYzine,  ondansetron **OR** ondansetron (ZOFRAN) IV, simethicone, sodium chloride flush, sorbitol, zolpidem    Anti-infectives (From admission, onward)   None        Objective:   Vitals:   05/30/18 0700 05/30/18 1037 05/30/18 1118 05/30/18 1444  BP: (!) 116/100 (!) 166/77 (!) 155/66 (!) 132/57  Pulse: 71  72   Resp: 17  (!) 22   Temp: 97.9 F (36.6 C)  98.1 F (36.7 C)   TempSrc: Oral  Oral   SpO2: 98%  99%   Weight: 80.3 kg     Height:        Wt Readings from Last 3 Encounters:  05/30/18 80.3 kg  05/17/18 78.2 kg  02/04/18 80.3 kg     Intake/Output Summary (Last 24 hours) at 05/30/2018 1543 Last data filed at 05/30/2018 1200 Gross per 24 hour  Intake 363 ml  Output 1100 ml  Net -737 ml    Physical Exam Patient is examined daily including today on 05/30/18 , exams remain the  same as of yesterday except that has changed   Gen:- Awake Alert, in no acute distress HEENT:- [place].AT, No sclera icterus Nose- [place] 1 L/min Neck-Supple Neck.  Lungs-diminished in bases with bibasilar rales, no wheezing CV- S1, S2 normal, regular  Abd-  +ve B.Sounds, Abd Soft, No tenderness,    Extremity:- +ve edema, pedal pulses present  Psych-affect is flat, some cognitive deficits Neuro-left-sided hemiparesis  GU--- condom cath with less dark urine Skin--- excoriated areas on upper back, back of neck and lower extremities  Data Review:   Micro Results No results found for this or any previous visit (from the past 240 hour(s)).  Radiology Reports Ct Chest Wo Contrast  Result Date: 05/11/2018 CLINICAL DATA:  58 year old male with hypoxia. EXAM: CT CHEST WITHOUT CONTRAST TECHNIQUE: Multidetector CT imaging of the chest was performed following the standard protocol without IV contrast. COMPARISON:  Chest radiograph dated 05/11/2018 FINDINGS: Evaluation of this exam is limited in the absence of intravenous contrast. Cardiovascular: There is mild cardiomegaly. No significant pericardial effusion. There  is multi vessel coronary vascular calcification. There is hypoattenuation of the cardiac blood pool suggestive of a degree of anemia. Clinical correlation is recommended. There is moderate atherosclerotic calcification of the thoracic aorta. The central pulmonary arteries are grossly unremarkable. Mediastinum/Nodes: Top-normal paratracheal lymph nodes measure up to 10 mm in short axis. Subcarinal lymph node measures 17 mm in short axis. The esophagus is grossly unremarkable. No mediastinal fluid collection. Lungs/Pleura: There are small bilateral pleural effusions. There is diffuse interstitial and interlobular septal prominence with Kerley B-lines as well as patchy areas of central and perihilar ground-glass density. Findings most consistent with pulmonary edema with probable superimposed pneumonia. Clinical correlation is recommended. There is no pneumothorax. The central airways are patent. Upper Abdomen: There are stones within the gallbladder. There is slight irregularity of the liver contour which may represent early changes of cirrhosis. Clinical correlation is recommended. Musculoskeletal: Degenerative changes of the spine. No acute osseous pathology. IMPRESSION: 1. Findings most consistent with pulmonary edema with probable superimposed pneumonia. Clinical correlation is recommended. 2. Small bilateral pleural effusions. 3. Cholelithiasis. Electronically Signed   By: Anner Crete M.D.   On: 05/11/2018 19:59   US Renal  Result Date: 05/12/2018 CLINICAL DATA:  Renal injury. EXAM: RENAL / URINARY TRACT ULTRASOUND COMPLETE COMPARISON:  No prior. FINDINGS: Right Kidney: Renal measurements: 10.7 x 5.7 x 5.3 cm = volume: 168.2 mL. Increased echogenicity. No mass or hydronephrosis visualized. Left Kidney: Renal measurements: 10.6 x 7.0 x 6.7 cm = volume: 258.0 mL. Increased echogenicity. No mass or hydronephrosis visualized. Bladder: Foley catheter in the bladder. Right pleural effusion incidentally noted.  IMPRESSION: 1. Increased renal echogenicity noted bilaterally. These findings consistent chronic medical renal disease. No acute abnormality identified. No hydronephrosis or bladder distention. 2.  Right pleural effusion is incidentally noted. Electronically Signed   By: Marcello Moores  Register   On: 05/12/2018 11:40   Nm Pulmonary Perf And Vent  Result Date: 05/27/2018 CLINICAL DATA:  Respiratory failure EXAM: NUCLEAR MEDICINE VENTILATION - PERFUSION LUNG SCAN VIEWS: Anterior, posterior, left lateral, right lateral, RPO, LPO, RAO, LAO-ventilation and perfusion RADIOPHARMACEUTICALS:  32.3 mCi of Tc-5m DTPA aerosol inhalation and 4.30 mCi Tc40m MAA IV COMPARISON:  Chest radiograph May 26, 2018 FINDINGS: Ventilation: Radiotracer uptake is homogeneous and symmetric bilaterally. No ventilation defects evident. Perfusion: Radiotracer uptake is homogeneous and symmetric bilaterally. No perfusion defects evident. IMPRESSION: No appreciable ventilation or perfusion defects. Very low probability of pulmonary embolus. Electronically Signed  By: Lowella Grip III M.D.   On: 05/27/2018 13:17   Dg Chest Port 1 View  Result Date: 05/26/2018 CLINICAL DATA:  Shortness of breath. EXAM: PORTABLE CHEST 1 VIEW COMPARISON:  05/13/2018. FINDINGS: Mildly enlarged cardiac silhouette with a mild increase in size. Resolved bilateral airspace opacity. Residual interstitial prominence with [employer]. No visible pleural fluid. The right lateral costophrenic angle is not included. The lungs are mildly hyperexpanded. Thoracic spine degenerative changes. IMPRESSION: Improving changes of congestive heart failure superimposed on COPD with residual interstitial pulmonary edema. Electronically Signed   By: Claudie Revering M.D.   On: 05/26/2018 20:26   Dg Chest Port 1 View  Result Date: 05/13/2018 CLINICAL DATA:  Pulmonary edema short of breath EXAM: PORTABLE CHEST 1 VIEW COMPARISON:  05/12/2018 FINDINGS: Progression of by lateral airspace  disease which is perihilar in location. No effusion. Heart size upper normal. IMPRESSION: Progression of bilateral airspace disease compatible with edema. Electronically Signed   By: Franchot Gallo M.D.   On: 05/13/2018 07:48   Dg Chest Port 1 View  Result Date: 05/12/2018 CLINICAL DATA:  Shortness of breath.  Respiratory failure. EXAM: PORTABLE CHEST 1 VIEW COMPARISON:  May 11, 2018 FINDINGS: No pneumothorax. Increasing bilateral pulmonary opacities, particularly in the bases. Diffuse interstitial prominence. The heart size is borderline. No other interval changes. IMPRESSION: 1. Worsening of bilateral pulmonary opacities, suggested to represent pneumonia superimposed on a background of edema on the CT scan from May 11, 2018. Recommend clinical correlation and follow-up to resolution. Electronically Signed   By: Dorise Bullion III M.D   On: 05/12/2018 08:34   Dg Chest Port 1 View  Result Date: 05/11/2018 CLINICAL DATA:  Shortness of breath, weakness, and vomiting. EXAM: PORTABLE CHEST 1 VIEW COMPARISON:  05/25/2017 FINDINGS: Mild cardiomegaly. Moderate diffuse interstitial infiltrates with Kerley B-lines, consistent with diffuse interstitial edema. No evidence of focal consolidation or significant pleural effusion. IMPRESSION: Cardiomegaly and diffuse interstitial edema, consistent with congestive heart failure. Electronically Signed   By: Earle Gell M.D.   On: 05/11/2018 18:47   Vas [place] Lower Extremity Venous (dvt)  Result Date: 05/12/2018  Lower Venous Study Indications: SOB.  Performing Technologist: Toma Copier RVS  Examination Guidelines: A complete evaluation includes B-mode imaging, spectral Doppler, color Doppler, and power Doppler as needed of all accessible portions of each vessel. Bilateral testing is considered an integral part of a complete examination. Limited examinations for reoccurring indications may be performed as noted.  Right Venous Findings:  +---------+---------------+---------+-----------+----------+-------+          CompressibilityPhasicitySpontaneityPropertiesSummary +---------+---------------+---------+-----------+----------+-------+ CFV      Full           Yes      Yes                          +---------+---------------+---------+-----------+----------+-------+ SFJ      Full                                                 +---------+---------------+---------+-----------+----------+-------+ FV Prox  Full           Yes      Yes                          +---------+---------------+---------+-----------+----------+-------+ FV Mid   Full                                                 +---------+---------------+---------+-----------+----------+-------+  FV DistalFull           Yes      Yes                          +---------+---------------+---------+-----------+----------+-------+ PFV      Full           Yes      Yes                          +---------+---------------+---------+-----------+----------+-------+ POP      Full           Yes      Yes                          +---------+---------------+---------+-----------+----------+-------+ PTV      Full                                                 +---------+---------------+---------+-----------+----------+-------+ PERO     Full                                                 +---------+---------------+---------+-----------+----------+-------+ Mild technical difficulty due to involuntary jerking of the legs  Left Venous Findings: +---------+---------------+---------+-----------+----------+-------+          CompressibilityPhasicitySpontaneityPropertiesSummary +---------+---------------+---------+-----------+----------+-------+ CFV      Full           Yes      Yes                          +---------+---------------+---------+-----------+----------+-------+ SFJ      Full                                                  +---------+---------------+---------+-----------+----------+-------+ FV Prox  Full           Yes      Yes                          +---------+---------------+---------+-----------+----------+-------+ FV Mid   Full                                                 +---------+---------------+---------+-----------+----------+-------+ FV DistalFull           Yes      Yes                          +---------+---------------+---------+-----------+----------+-------+ PFV      Full           Yes      Yes                          +---------+---------------+---------+-----------+----------+-------+ POP      Full  Yes      Yes                          +---------+---------------+---------+-----------+----------+-------+ PTV      Full                                                 +---------+---------------+---------+-----------+----------+-------+ PERO     Full                                                 +---------+---------------+---------+-----------+----------+-------+ Mild technical difficulty due to involuntary jerking of the leg    Summary: Right: There is no evidence of deep vein thrombosis in the lower extremity. No cystic structure found in the popliteal fossa. Left: There is no evidence of deep vein thrombosis in the lower extremity. No cystic structure found in the popliteal fossa.  *See table(s) above for measurements and observations. Electronically signed by Monica Martinez MD on 05/12/2018 at 7:25:49 PM.    Final      CBC Recent Labs  Lab 05/26/18 1947 05/27/18 9381 05/28/18 0518 05/30/18 0513  WBC 13.0* 15.3* 11.4* 10.5  HGB 8.6* 8.6* 7.8* 8.3*  HCT 27.4* 26.6* 23.1* 25.8*  PLT 328 307 276 286  MCV 87.8 85.5 85.9 87.8  MCH 27.6 27.7 29.0 28.2  MCHC 31.4 32.3 33.8 32.2  RDW 13.6 13.5 13.7 13.9  LYMPHSABS 1.1  --   --   --   MONOABS 1.1*  --   --   --   EOSABS 0.2  --   --   --   BASOSABS 0.1  --   --   --     Chemistries    Recent Labs  Lab 05/26/18 1947 05/27/18 0621 05/28/18 0518 05/29/18 0330 05/30/18 0513  NA 134* 134* 135 137 135  K 3.6 3.3* 3.1* 3.8 4.0  CL 98 97* 100 102 100  CO2 22 21* 22 22 23   GLUCOSE 177* 69* 76 54* 78  BUN 74* 77* 84* 82* 79*  CREATININE 4.77* 4.92* 5.21* 5.17* 5.33*  CALCIUM 7.9* 8.2* 7.9* 8.1* 8.1*  AST 21  --   --   --   --   ALT 22  --   --   --   --   ALKPHOS 72  --   --   --   --   BILITOT 0.7  --   --   --   --    ------------------------------------------------------------------------------------------------------------------ No results for input(s): CHOL, HDL, LDLCALC, TRIG, CHOLHDL, LDLDIRECT in the last 72 hours.  Lab Results  Component Value Date   HGBA1C 8.3 (H) 05/12/2018   ------------------------------------------------------------------------------------------------------------------ Recent Labs    05/28/18 0518  TSH 8.783*   ------------------------------------------------------------------------------------------------------------------ Recent Labs    05/28/18 0518  FERRITIN 92  TIBC 127*  IRON 16*   -----------------------------------------------------------------------------------------------------------------    Component Value Date/Time   BNP 490.8 (H) 05/26/2018 1948     Roxan Hockey M.D on 05/30/2018 at 3:43 PM  Go to [email or web address] - for contact info  [facility] - Office  [phone]

## 2018-05-31 LAB — GLUCOSE, CAPILLARY
GLUCOSE-CAPILLARY: 74 mg/dL (ref 70–99)
Glucose-Capillary: 97 mg/dL (ref 70–99)
Glucose-Capillary: 113 mg/dL — ABNORMAL HIGH (ref 70–99)

## 2018-05-31 LAB — RENAL FUNCTION PANEL
Albumin: 1.8 g/dL — ABNORMAL LOW (ref 3.5–5.0)
Anion gap: 14 (ref 5–15)
BUN: 77 mg/dL — ABNORMAL HIGH (ref 6–20)
CO2: 22 mmol/L (ref 22–32)
Calcium: 8.2 mg/dL — ABNORMAL LOW (ref 8.9–10.3)
Chloride: 100 mmol/L (ref 98–111)
Creatinine, Ser: 5.25 mg/dL — ABNORMAL HIGH (ref 0.61–1.24)
GFR calc Af Amer: 13 mL/min — ABNORMAL LOW (ref >60)
GFR calc non Af Amer: 11 mL/min — ABNORMAL LOW (ref >60)
Glucose, Bld: 93 mg/dL (ref 70–99)
PHOSPHORUS: 5.6 mg/dL — AB (ref 2.5–4.6)
Potassium: 3.8 mmol/L (ref 3.5–5.1)
Sodium: 136 mmol/L (ref 135–145)

## 2018-05-31 MED ORDER — ACETAMINOPHEN 325 MG PO TABS: 650.0000 mg | ORAL_TABLET | Freq: Four times a day (QID) | ORAL | 0 refills | Status: AC | PRN

## 2018-05-31 MED ORDER — CAMPHOR-MENTHOL 0.5-0.5 % EX LOTN: 1.0000 "application " | TOPICAL_LOTION | Freq: Three times a day (TID) | CUTANEOUS | 0 refills | Status: AC | PRN

## 2018-05-31 MED ORDER — ONDANSETRON HCL 4 MG PO TABS: 4.0000 mg | ORAL_TABLET | Freq: Four times a day (QID) | ORAL | 0 refills | Status: AC | PRN

## 2018-05-31 MED ORDER — GLUCERNA PO LIQD: 237.0000 mL | Freq: Three times a day (TID) | ORAL | 5 refills | Status: AC

## 2018-05-31 MED ORDER — BISACODYL 10 MG RE SUPP: 10.0000 mg | RECTAL | 0 refills | Status: AC

## 2018-05-31 MED ORDER — DOCUSATE SODIUM 283 MG RE ENEM: 1.0000 | ENEMA | RECTAL | 0 refills | Status: AC | PRN

## 2018-05-31 MED ORDER — FUROSEMIDE 40 MG PO TABS: 40.0000 mg | ORAL_TABLET | Freq: Two times a day (BID) | ORAL | 11 refills | Status: AC

## 2018-05-31 MED ORDER — NICOTINE 21 MG/24HR TD PT24: 21.0000 mg | MEDICATED_PATCH | Freq: Every day | TRANSDERMAL | 0 refills | Status: AC

## 2018-05-31 MED ORDER — MIRTAZAPINE 15 MG PO TABS: 15.0000 mg | ORAL_TABLET | Freq: Every day | ORAL | 2 refills | Status: AC

## 2018-05-31 MED ORDER — ATORVASTATIN CALCIUM 40 MG PO TABS: 40.0000 mg | ORAL_TABLET | Freq: Every evening | ORAL | Status: DC

## 2018-05-31 MED ORDER — INSULIN GLARGINE 100 UNIT/ML SOLOSTAR PEN: 30.0000 [IU] | PEN_INJECTOR | Freq: Every day | SUBCUTANEOUS | 11 refills | Status: AC

## 2018-05-31 NOTE — Progress Notes (Signed)
Patient will discharge to: [place] at [facility] Anticipated discharge date: 05/31/2018 Family notified: [name]  Transportation by: Corey Harold Nurse to call report to: [place] Work Signing Off  Arlis Porta, Social Work Ship broker

## 2018-05-31 NOTE — Progress Notes (Addendum)
During hourly rounding pt stated he was feeling nauseated. He began to spit up brown mucus sputum. Gave Zofran and will continue to monitor.

## 2018-05-31 NOTE — Social Work (Signed)
Patient denied for SNF coverage under [facility]. [place] [facility] will take patient back under his Medicaid. SNF will arrange palliative services for patient. Updated MD. CSW to support with discharge.  Estanislado Emms, LCSW [phone]

## 2018-05-31 NOTE — Care Management Note (Signed)
Case Management Note  Patient Details  Name: CEM KOSMAN MRN: [id number] Date of Birth: [birth year]  Subjective/Objective:  Pt presented for Acute Respiratory Failure. PTA from [place]. CSW assisting with transition of care needs.                  Action/Plan: No further needs from CM at this time.   Expected Discharge Date:  05/31/18               Expected Discharge Plan:  Skilled Nursing Facility  In-House Referral:  Clinical Social Work  Discharge planning Services  CM Consult  Post Acute Care Choice:  NA Choice offered to:  NA  DME Arranged:  N/A DME Agency:  NA  HH Arranged:  NA HH Agency:  NA  Status of Service:  Completed, signed off  If discussed at [employer] of Stay Meetings, dates discussed:    Additional Comments:  Bethena Roys, RN 05/31/2018, 11:38 AM

## 2018-05-31 NOTE — Clinical Social Work Placement (Signed)
   CLINICAL SOCIAL WORK PLACEMENT  NOTE  Date:  05/31/2018  Patient Details  Name: William [name] MRN: [id number] Date of Birth: 02/18/1961  Clinical Social Work is seeking post-discharge placement for this patient at the [place] level of care (*CSW will initial, date and re-position this form in  chart as items are completed):  Yes   Patient/family provided with [facility]'s list of facilities offering this level of care within the geographic area requested by the patient (or if unable, by the patient's family).  Yes   Patient/family informed of their freedom to choose among providers that offer the needed level of care, that participate in Medicare, Medicaid or managed care program needed by the patient, have an available bed and are willing to accept the patient.  Yes   Patient/family informed of [facility]'s ownership interest in [facility] and [facility], as well as of the fact that they are under no obligation to receive care at these facilities.  PASRR submitted to EDS on       PASRR number received on       Existing PASRR number confirmed on 05/28/18     FL2 transmitted to all facilities in geographic area requested by pt/family on 05/28/18     FL2 transmitted to all facilities within larger geographic area on       Patient informed that his/her managed care company has contracts with or will negotiate with certain facilities, including the following:  [place])         Patient/family informed of bed offers received.  Patient chooses bed at [place])     Physician recommends and patient chooses bed at      Patient to be transferred to [facility]) on 05/31/18.  Patient to be transferred to facility by [facility]     Patient family notified on 05/31/18 of transfer.  Name of family member notified:   [name], sister     PHYSICIAN Please prepare priority discharge summary, including medications, Please prepare prescriptions     Additional Comment:    _______________________________________________ Estanislado Emms, LCSW 05/31/2018, 11:50 AM

## 2018-05-31 NOTE — Discharge Summary (Signed)
William [name], is a 58 y.o. male  DOB [birth year]  MRN [id number].  Admission date:  05/26/2018  Admitting Physician  Elwyn Reach, MD  Discharge Date:  05/31/2018   Primary MD  Marin Olp, MD  Recommendations for primary care physician for things to follow:   1)Palliative care consult at facility please-- 2)Palliative care team will help decide along with family/POA if patient needs to be transitioned to hospice should his condition decline  3)Palliative care team to help complete MOST form--- to delineate goals of care and parameters for intervention and  hospital transfer 4)Feed patient liberally----encourage supplements, snacks  Admission Diagnosis  sob   Discharge Diagnosis  sob   Principal Problem:   Acute respiratory failure with hypoxemia ([place]) Active Problems:   Type II diabetes mellitus with renal manifestations ([place])   Hyperlipidemia   Tobacco use disorder   Hypertension associated with diabetes ([place])   Stage 3 chronic renal impairment associated with type 2 diabetes mellitus ([employer] [facility])   Pulmonary edema   Acute on chronic combined systolic and diastolic CHF (congestive heart failure) ([place])   Palliative care by specialist   Goals of care, counseling/discussion      Past Medical History:  Diagnosis Date  . CEREBROVASCULAR ACCIDENT, HX OF 09/28/2006   2007 affected left side and peripheral vision  . DIABETES MELLITUS, TYPE II 09/28/2006  . GERD (gastroesophageal reflux disease)   . HYPERLIPIDEMIA 09/28/2006  . HYPERTENSION 09/28/2006  . Learning disability   . Stroke [facility]) years ago    Past Surgical History:  Procedure Laterality Date  . COLONOSCOPY    . LYMPHADENECTOMY    . RHINOPLASTY    . SIGMOIDOSCOPY         HPI  from the history and physical done on the day of admission:   Patient coming from: [place] skilled facility  Chief Complaint: Shortness of  breath  HPI: William [name] is a 59 y.o. male with medical history significant of CVA in 2007 with residual cognitive impairment, learning disability, left hemiparesis, diabetes with diabetic neuropathy and nephropathy, chronic kidney disease stage IV, hypertension, hyperlipidemia, who presented with progressive shortness of breath from the nursing facility.  He also has had decreased oral intake in the last 2 weeks.  Patient has worsening renal function.  Not on any diuretics.  He was not needing oxygen but currently requires oxygen.  He has had some cough but no wheezing.  No fever.  No symptoms of any respiratory infection per facility records.  Patient was seen in the ER and no evidence of pneumonia on his chest x-ray.  Suspected PE with CHF as differentials.  He has been admitted to the medical service for work-up.  ED Course: Temperature is 99 blood pressure 141/73 pulse 78 respiratory rate of 29 oxygen sat is 89% on room air.  White count is 13,000 with hemoglobin 8.6.  Sodium 134 potassium 3.6.  BUN is 74 and creatinine 4.77.  Baseline is at 3.5.Troponin 0 0.28.  Next x-ray  showed CHF with COPD findings      Hospital Course:   Brief Summary 58 y.o.malew/former smokerwith presumedICM (EF 30-35%), HTN, poorly controlled DM, HTN, prior CVA resulting in LE weakness, cognitive impairment(resides in SNF), CKD IV, mostly bedbound/wheelchair bound, recent NSTEMI admitted to 07/08/2018 with cough, shortness of breath and new onset hypoxia. VQ scan low probability for PE. [name] (POA) [name] is requesting palliative care consultation, palliative care consult appreciated, patient will need further involvement of palliative care services when he returns to facility   Plan:- 1)New onset Hypoxia/acute Hypoxic respiratory failure--- ???  Etiology, clinically and by imaging findings no evidence of pneumonia, VQ scan with low possibility for PE,, clinically suspect CHF exacerbation,  initially treated with IV heparin which was discontinued when VQ scan came back negative, hypoxia has resolved .  2)HFrEF--- patient with history of combined diastolic and systolic dysfunction CHF with presumed ICM, last known EF 30 to 35 %, BNP is 480, troponin 0 0.28 , cardiology consult appreciated, per nephrology and cardiology services okay to treat with Lasix , responded well to IV Lasix , discharged back to SNF on p.o. Lasix,  continue Coreg 12.5 mg twice daily and hydralazine 50 mg 3 times daily/ Imdur 60 mg daily  3)H/o CVA--- patient has residual cognitive deficits as well as difficulty with mobility related activities of daily living mostly bedbound/wheelchair bound, lives at SNF  4)AKI----acute kidney injury on CKD stage -     creatinine on admission=4.77 (up from 3.58 days prior to admission),   baseline creatinine =    , creatinine is now=      , renally adjust medications, avoid nephrotoxic agents/dehydration/hypotension, nephrology consult appreciated, renal ultrasound noted, urine output is not great  5)Anemia of chronic kidney disease--- ESA/Aranesp was given per nephrology service, No evidence of ongoing bleeding , hemoglobin 8.3  6) tobacco abuse--- smoking cessation advised, okay to use nicotine patch  7)DM2--A1c 8.3 reflecting poor diabetic control, patient with recurrent episodes of hypoglycemia due to poor oral intake,  we decreased Lantus insulin to 30 units   (was previously on 62 units)  nightly along with sliding scale coverage, STOPPED Tradjenta 5 mg daily, no further significant hypoglycemia at this time----patient with poor oral intake  8)H/o CAD/ICM--- last known EF 30 to 35%, patient had an STEMI in January 2020, continue aspirin 81 mg daily, Lipitor 80 mg every afternoon, Coreg 12.5 mg twice daily and Plavix 75 mg daily, continue isosorbide 60 mg daily, responded well to IV Lasix , discharged back to SNF on p.o. Lasix,  9)Social/Ethics---  d/w sister 35POA) Ms  [name], patient is a DNR/DNI, has renal and cardiac failure, as well as worsening anemia, goals of care discussed with POA,, at this time they declined aggressive treatment protocols--- palliative care consult appreciated---SUMMARY OFRECOMMENDATIONS: 1. DNR DNI 2. Recommend back to [facility] with palliative care, monitor disease trajectory and will need to be enrolled in hospice services soon, depending on his renal function and his overall health.  3. Continue efforts at diuresis and other current treatment.   transfer back to SNF with palliative care involvement----  Code Status : DNR  Family Communication:   Niece and sister at bedside  Disposition Plan  : SNF  Consults  :  Cardiology/Nephrology/palliative care   Discharge Condition: Overall prognosis is poor  Follow UP  Contact information for after-discharge care    Destination    [facility] .   Service:  Skilled Nursing Contact information: [address] [place] [phone]               Diet and Activity recommendation:  As advised  Discharge Instructions    Discharge Instructions    Activity as tolerated - No restrictions   Complete by:  As directed    Up to chair with assistance and equipment, fall precautions   Amb Referral to Palliative Care   Complete by:  As directed    Call MD for:  difficulty breathing, headache or visual disturbances   Complete by:  As directed    Call MD for:  persistant dizziness or light-headedness   Complete by:  As directed    Call MD for:  persistant nausea and vomiting   Complete by:  As directed    Call MD for:  severe uncontrolled pain   Complete by:  As directed    Call MD for:  temperature >100.4   Complete by:  As directed    Diet general   Complete by:  As directed    Feed patient liberally--- encourage supplements, snacks and adequate fluid intake   Discharge instructions   Complete  by:  As directed    1) palliative care consult at facility please-- 2) palliative care team will help decide along with family/POA if patient needs to be transitioned to hospice should his condition decline  3) palliative care team to help complete MOST form--- to delineate goals of care and parameters for intervention and  hospital transfer 4) feed patient liberally----encourage supplements, snacks        Discharge Medications     Allergies as of 05/31/2018      Reactions   Sulfamethoxazole Rash      Medication List    STOP taking these medications   atorvastatin 80 MG tablet Commonly known as:  LIPITOR   escitalopram 10 MG tablet Commonly known as:  LEXAPRO   linagliptin 5 MG Tabs tablet Commonly known as:  TRADJENTA     TAKE these medications   acetaminophen 325 MG tablet Commonly known as:  TYLENOL Take 2 tablets (650 mg total) by mouth every 6 (six) hours as needed for fever.   ARTIFICIAL TEARS 0.4 % Soln Generic drug:  Hypromellose Place 2 drops into both eyes 2 (two) times daily.   aspirin 81 MG chewable tablet Chew 1 tablet (81 mg total) by mouth daily.   bisacodyl 10 MG suppository Commonly known as:  DULCOLAX Place 1 suppository (10 mg total) rectally every Monday, Wednesday, and Friday. Start taking on:  June 01, 2018   buPROPion 150 MG 12 hr tablet Commonly known as:  WELLBUTRIN SR Take 150 mg by mouth 2 (two) times daily.   camphor-menthol lotion Commonly known as:  SARNA Apply 1 application topically every 8 (eight) hours as needed for itching.   carvedilol 12.5 MG tablet Commonly known as:  COREG Take 1 tablet (12.5 mg total) by mouth 2 (two) times daily with a meal.   clopidogrel 75 MG tablet Commonly known as:  PLAVIX Take 1 tablet (75 mg total) by mouth daily.   docusate sodium 283 MG enema Commonly known as:  ENEMEEZ Place 1 enema (283 mg total) rectally as needed for severe constipation.   ferrous sulfate 325 (65 FE) MG  tablet Take 1 tablet (325 mg total) by mouth 2 (two) times daily with a meal. What changed:  when to take this   furosemide 40 MG tablet Commonly  known as:  LASIX Take 1 tablet (40 mg total) by mouth 2 (two) times daily.   GLUCERNA Liqd Take 237 mLs by mouth 3 (three) times daily between meals. What changed:  when to take this   hydrALAZINE 50 MG tablet Commonly known as:  APRESOLINE Take 1 tablet (50 mg total) by mouth every 8 (eight) hours.   hydrOXYzine 25 MG tablet Commonly known as:  ATARAX/VISTARIL Take 1 tablet (25 mg total) by mouth every 6 (six) hours as needed for itching.   Insulin Glargine 100 UNIT/ML Solostar Pen Commonly known as:  LANTUS SOLOSTAR Inject 30 Units into the skin at bedtime. What changed:  how much to take   isosorbide mononitrate 60 MG 24 hr tablet Commonly known as:  IMDUR Take 1 tablet (60 mg total) by mouth daily.   mirtazapine 15 MG tablet Commonly known as:  REMERON Take 1 tablet (15 mg total) by mouth at bedtime.   nicotine 21 mg/24hr patch Commonly known as:  NICODERM CQ - dosed in mg/24 hours Place 1 patch (21 mg total) onto the skin daily. Start taking on:  June 01, 2018   NOVOLOG PENFILL cartridge Generic drug:  insulin aspart Inject 20-24 Units into the skin 3 (three) times daily before meals. 20 units in the morning. 24 units in the afternoon. 20 units in the evening   ondansetron 4 MG tablet Commonly known as:  ZOFRAN Take 1 tablet (4 mg total) by mouth every 6 (six) hours as needed for nausea.   pantoprazole 40 MG tablet Commonly known as:  PROTONIX Take 1 tablet (40 mg total) by mouth daily.   simethicone 80 MG chewable tablet Commonly known as:  MYLICON Chew 440 mg by mouth 3 (three) times daily as needed for flatulence.       Major procedures and Radiology Reports - PLEASE review detailed and final reports for all details, in brief -   Ct Chest Wo Contrast  Result Date: 05/11/2018 CLINICAL DATA:   58 year old male with hypoxia. EXAM: CT CHEST WITHOUT CONTRAST TECHNIQUE: Multidetector CT imaging of the chest was performed following the standard protocol without IV contrast. COMPARISON:  Chest radiograph dated 05/11/2018 FINDINGS: Evaluation of this exam is limited in the absence of intravenous contrast. Cardiovascular: There is mild cardiomegaly. No significant pericardial effusion. There is multi vessel coronary vascular calcification. There is hypoattenuation of the cardiac blood pool suggestive of a degree of anemia. Clinical correlation is recommended. There is moderate atherosclerotic calcification of the thoracic aorta. The central pulmonary arteries are grossly unremarkable. Mediastinum/Nodes: Top-normal paratracheal lymph nodes measure up to 10 mm in short axis. Subcarinal lymph node measures 17 mm in short axis. The esophagus is grossly unremarkable. No mediastinal fluid collection. Lungs/Pleura: There are small bilateral pleural effusions. There is diffuse interstitial and interlobular septal prominence with Kerley B-lines as well as patchy areas of central and perihilar ground-glass density. Findings most consistent with pulmonary edema with probable superimposed pneumonia. Clinical correlation is recommended. There is no pneumothorax. The central airways are patent. Upper Abdomen: There are stones within the gallbladder. There is slight irregularity of the liver contour which may represent early changes of cirrhosis. Clinical correlation is recommended. Musculoskeletal: Degenerative changes of the spine. No acute osseous pathology. IMPRESSION: 1. Findings most consistent with pulmonary edema with probable superimposed pneumonia. Clinical correlation is recommended. 2. Small bilateral pleural effusions. 3. Cholelithiasis. Electronically Signed   By: Anner Crete M.D.   On: 05/11/2018 19:59   US Renal  Result Date:  05/12/2018 CLINICAL DATA:  Renal injury. EXAM: RENAL / URINARY TRACT  ULTRASOUND COMPLETE COMPARISON:  No prior. FINDINGS: Right Kidney: Renal measurements: 10.7 x 5.7 x 5.3 cm = volume: 168.2 mL. Increased echogenicity. No mass or hydronephrosis visualized. Left Kidney: Renal measurements: 10.6 x 7.0 x 6.7 cm = volume: 258.0 mL. Increased echogenicity. No mass or hydronephrosis visualized. Bladder: Foley catheter in the bladder. Right pleural effusion incidentally noted. IMPRESSION: 1. Increased renal echogenicity noted bilaterally. These findings consistent chronic medical renal disease. No acute abnormality identified. No hydronephrosis or bladder distention. 2.  Right pleural effusion is incidentally noted. Electronically Signed   By: Marcello Moores  Register   On: 05/12/2018 11:40   Nm Pulmonary Perf And Vent  Result Date: 05/27/2018 CLINICAL DATA:  Respiratory failure EXAM: NUCLEAR MEDICINE VENTILATION - PERFUSION LUNG SCAN VIEWS: Anterior, posterior, left lateral, right lateral, RPO, LPO, RAO, LAO-ventilation and perfusion RADIOPHARMACEUTICALS:  32.3 mCi of Tc-43m DTPA aerosol inhalation and 4.30 mCi Tc84m MAA IV COMPARISON:  Chest radiograph May 26, 2018 FINDINGS: Ventilation: Radiotracer uptake is homogeneous and symmetric bilaterally. No ventilation defects evident. Perfusion: Radiotracer uptake is homogeneous and symmetric bilaterally. No perfusion defects evident. IMPRESSION: No appreciable ventilation or perfusion defects. Very low probability of pulmonary embolus. Electronically Signed   By: Lowella Grip III M.D.   On: 05/27/2018 13:17   Dg Chest Port 1 View  Result Date: 05/26/2018 CLINICAL DATA:  Shortness of breath. EXAM: PORTABLE CHEST 1 VIEW COMPARISON:  05/13/2018. FINDINGS: Mildly enlarged cardiac silhouette with a mild increase in size. Resolved bilateral airspace opacity. Residual interstitial prominence with [employer]. No visible pleural fluid. The right lateral costophrenic angle is not included. The lungs are mildly hyperexpanded. Thoracic spine  degenerative changes. IMPRESSION: Improving changes of congestive heart failure superimposed on COPD with residual interstitial pulmonary edema. Electronically Signed   By: Claudie Revering M.D.   On: 05/26/2018 20:26   Dg Chest Port 1 View  Result Date: 05/13/2018 CLINICAL DATA:  Pulmonary edema short of breath EXAM: PORTABLE CHEST 1 VIEW COMPARISON:  05/12/2018 FINDINGS: Progression of by lateral airspace disease which is perihilar in location. No effusion. Heart size upper normal. IMPRESSION: Progression of bilateral airspace disease compatible with edema. Electronically Signed   By: Franchot Gallo M.D.   On: 05/13/2018 07:48   Dg Chest Port 1 View  Result Date: 05/12/2018 CLINICAL DATA:  Shortness of breath.  Respiratory failure. EXAM: PORTABLE CHEST 1 VIEW COMPARISON:  May 11, 2018 FINDINGS: No pneumothorax. Increasing bilateral pulmonary opacities, particularly in the bases. Diffuse interstitial prominence. The heart size is borderline. No other interval changes. IMPRESSION: 1. Worsening of bilateral pulmonary opacities, suggested to represent pneumonia superimposed on a background of edema on the CT scan from May 11, 2018. Recommend clinical correlation and follow-up to resolution. Electronically Signed   By: Dorise Bullion III M.D   On: 05/12/2018 08:34   Dg Chest Port 1 View  Result Date: 05/11/2018 CLINICAL DATA:  Shortness of breath, weakness, and vomiting. EXAM: PORTABLE CHEST 1 VIEW COMPARISON:  05/25/2017 FINDINGS: Mild cardiomegaly. Moderate diffuse interstitial infiltrates with Kerley B-lines, consistent with diffuse interstitial edema. No evidence of focal consolidation or significant pleural effusion. IMPRESSION: Cardiomegaly and diffuse interstitial edema, consistent with congestive heart failure. Electronically Signed   By: Earle Gell M.D.   On: 05/11/2018 18:47   Vas [place] Lower Extremity Venous (dvt)  Result Date: 05/12/2018  Lower Venous Study Indications: SOB.  Performing  Technologist: Toma Copier RVS  Examination Guidelines: A complete  evaluation includes B-mode imaging, spectral Doppler, color Doppler, and power Doppler as needed of all accessible portions of each vessel. Bilateral testing is considered an integral part of a complete examination. Limited examinations for reoccurring indications may be performed as noted.  Right Venous Findings: +---------+---------------+---------+-----------+----------+-------+          CompressibilityPhasicitySpontaneityPropertiesSummary +---------+---------------+---------+-----------+----------+-------+ CFV      Full           Yes      Yes                          +---------+---------------+---------+-----------+----------+-------+ SFJ      Full                                                 +---------+---------------+---------+-----------+----------+-------+ FV Prox  Full           Yes      Yes                          +---------+---------------+---------+-----------+----------+-------+ FV Mid   Full                                                 +---------+---------------+---------+-----------+----------+-------+ FV DistalFull           Yes      Yes                          +---------+---------------+---------+-----------+----------+-------+ PFV      Full           Yes      Yes                          +---------+---------------+---------+-----------+----------+-------+ POP      Full           Yes      Yes                          +---------+---------------+---------+-----------+----------+-------+ PTV      Full                                                 +---------+---------------+---------+-----------+----------+-------+ PERO     Full                                                 +---------+---------------+---------+-----------+----------+-------+ Mild technical difficulty due to involuntary jerking of the legs  Left Venous Findings:  +---------+---------------+---------+-----------+----------+-------+          CompressibilityPhasicitySpontaneityPropertiesSummary +---------+---------------+---------+-----------+----------+-------+ CFV      Full           Yes      Yes                          +---------+---------------+---------+-----------+----------+-------+ SFJ  Full                                                 +---------+---------------+---------+-----------+----------+-------+ FV Prox  Full           Yes      Yes                          +---------+---------------+---------+-----------+----------+-------+ FV Mid   Full                                                 +---------+---------------+---------+-----------+----------+-------+ FV DistalFull           Yes      Yes                          +---------+---------------+---------+-----------+----------+-------+ PFV      Full           Yes      Yes                          +---------+---------------+---------+-----------+----------+-------+ POP      Full           Yes      Yes                          +---------+---------------+---------+-----------+----------+-------+ PTV      Full                                                 +---------+---------------+---------+-----------+----------+-------+ PERO     Full                                                 +---------+---------------+---------+-----------+----------+-------+ Mild technical difficulty due to involuntary jerking of the leg    Summary: Right: There is no evidence of deep vein thrombosis in the lower extremity. No cystic structure found in the popliteal fossa. Left: There is no evidence of deep vein thrombosis in the lower extremity. No cystic structure found in the popliteal fossa.  *See table(s) above for measurements and observations. Electronically signed by Monica Martinez MD on 05/12/2018 at 7:25:49 PM.    Final     Micro Results   No  results found for this or any previous visit (from the past 240 hour(s)).     Today   Subjective    William [name] today has no new complaints, oral intake remains poor, urine output remains poor, no further hypoxia          Patient has been seen and examined prior to discharge   Objective   Blood pressure (!) 157/78, pulse 70, temperature (!) 97.4 F (36.3 C), temperature source Oral, resp. rate (!) 21, height 5\' 8"  (1.727 m), weight 78.2 kg, SpO2 97 %.   Intake/Output Summary (Last 24 hours) at 05/31/2018  [place] at 05/31/2018 0654 Gross per 24 hour  Intake 720 ml  Output 1200 ml  Net -480 ml    Exam Gen:- Awake Alert, in no acute distress HEENT:- [place].AT, No sclera icterus Nose- [place] 1 L/min Neck-Supple Neck.  Lungs-diminished in bases with bibasilar rales, no wheezing CV- S1, S2 normal, regular  Abd-  +ve B.Sounds, Abd Soft, No tenderness,    Extremity:- +ve edema, pedal pulses present  Psych-affect is flat, some cognitive deficits Neuro-left-sided hemiparesis (not new) GU--- condom cath with less dark urine Skin--- excoriated areas on upper back, back of neck and lower extremities--- no evidence of superimposed/secondary cellulitis or infection   Data Review   CBC w Diff:  Lab Results  Component Value Date   WBC 10.5 05/30/2018   HGB 8.3 (L) 05/30/2018   HCT 25.8 (L) 05/30/2018   PLT 286 05/30/2018   LYMPHOPCT 8 05/26/2018   MONOPCT 8 05/26/2018   EOSPCT 1 05/26/2018   BASOPCT 0 05/26/2018    CMP:  Lab Results  Component Value Date   NA 136 05/31/2018   K 3.8 05/31/2018   CL 100 05/31/2018   CO2 22 05/31/2018   BUN 77 (H) 05/31/2018   CREATININE 5.25 (H) 05/31/2018   PROT 7.1 05/26/2018   ALBUMIN 1.8 (L) 05/31/2018   BILITOT 0.7 05/26/2018   ALKPHOS 72 05/26/2018   AST 21 05/26/2018   ALT 22 05/26/2018  .   Total Discharge time is about 33 minutes  Roxan Hockey M.D on 05/31/2018 at 11:44 AM  Go to [email or web address] -  for  contact info  [facility] - Office  [phone]

## 2018-05-31 NOTE — Discharge Instructions (Signed)
1) palliative care consult at facility please-- 2) palliative care team will help decide along with family/POA if patient needs to be transitioned to hospice should his condition decline  3) palliative care team to help complete MOST form--- to delineate goals of care and parameters for intervention and  hospital transfer 4) feed patient liberally----encourage supplements, snacks

## 2018-06-01 ENCOUNTER — Ambulatory Visit: Payer: Medicare Other | Admitting: Cardiology

## 2018-06-01 DIAGNOSIS — J9601 Acute respiratory failure with hypoxia: Secondary | ICD-10-CM | POA: Diagnosis not present

## 2018-06-01 DIAGNOSIS — E1169 Type 2 diabetes mellitus with other specified complication: Secondary | ICD-10-CM | POA: Diagnosis not present

## 2018-06-01 DIAGNOSIS — I5043 Acute on chronic combined systolic (congestive) and diastolic (congestive) heart failure: Secondary | ICD-10-CM | POA: Diagnosis not present

## 2018-06-01 DIAGNOSIS — R1311 Dysphagia, oral phase: Secondary | ICD-10-CM | POA: Diagnosis not present

## 2018-06-01 DIAGNOSIS — I2102 ST elevation (STEMI) myocardial infarction involving left anterior descending coronary artery: Secondary | ICD-10-CM | POA: Diagnosis not present

## 2018-06-01 DIAGNOSIS — L130 Dermatitis herpetiformis: Secondary | ICD-10-CM | POA: Diagnosis not present

## 2018-06-01 DIAGNOSIS — I2109 ST elevation (STEMI) myocardial infarction involving other coronary artery of anterior wall: Secondary | ICD-10-CM | POA: Diagnosis not present

## 2018-06-01 DIAGNOSIS — M6281 Muscle weakness (generalized): Secondary | ICD-10-CM | POA: Diagnosis not present

## 2018-06-01 DIAGNOSIS — I5020 Unspecified systolic (congestive) heart failure: Secondary | ICD-10-CM | POA: Diagnosis not present

## 2018-06-02 DIAGNOSIS — E1169 Type 2 diabetes mellitus with other specified complication: Secondary | ICD-10-CM | POA: Diagnosis not present

## 2018-06-02 DIAGNOSIS — J9601 Acute respiratory failure with hypoxia: Secondary | ICD-10-CM | POA: Diagnosis not present

## 2018-06-02 DIAGNOSIS — R627 Adult failure to thrive: Secondary | ICD-10-CM | POA: Diagnosis not present

## 2018-06-19 DEATH — deceased

## 2019-09-14 IMAGING — MR MR THORACIC SPINE WO/W CM
1 series · 8 of 8 positions shown · IV contrast (multihance)
Comparison: Brain MRI today and also 07/25/2017. Chest radiographs
05/25/2017, 09/19/2010.

ADDENDUM:
Study discussed by telephone with Hospitalist Dr. Yayo on 08/19/2017
at 4085 hours.

Study discussed in person with Dr. Benrabah Etoil on 08/19/2017
at 6671 hours.
CLINICAL DATA: 56-year-old male with Romig disability, whose
family has noted difficulty walking over the past year.
Ataxia, falls. Generalized weakness. Urinary incontinence. Memory
problems.
Diabetes, hypertension, stage 3 chronic kidney disease.
EXAM:
MRI THORACIC SPINE WITHOUT AND WITH CONTRAST
MRI LUMBAR SPINE WITHOUT CONTRAST.
TECHNIQUE: Multiplanar and multiecho pulse sequences of the thoracic and lumbar
spine were obtained. The thoracic spine was imaged without and with
intravenous contrast.
CONTRAST:  15mL MULTIHANCE GADOBENATE DIMEGLUMINE 529 MG/ML IV SOLN

[Series 18: T1 · 8 of 8 frames shown]
[frame 1/8]
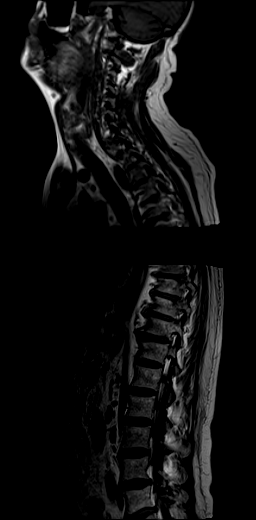
[frame 2/8]
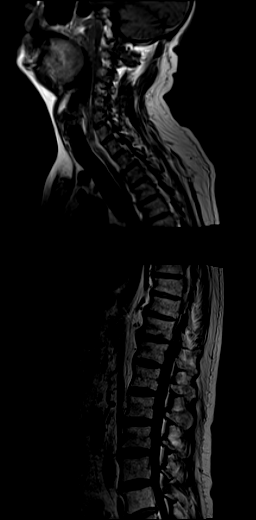
[frame 3/8]
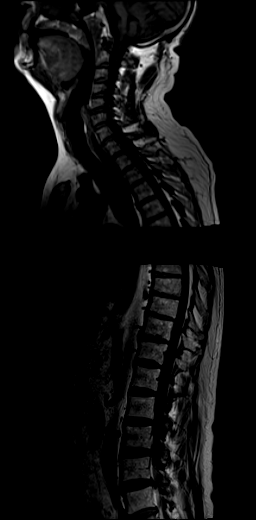
[frame 4/8]
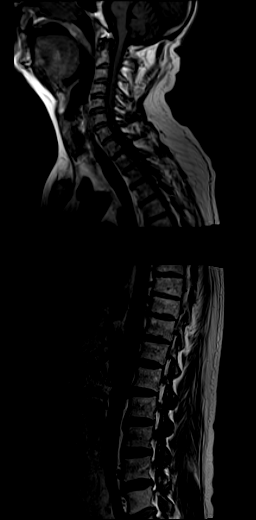
[frame 5/8]
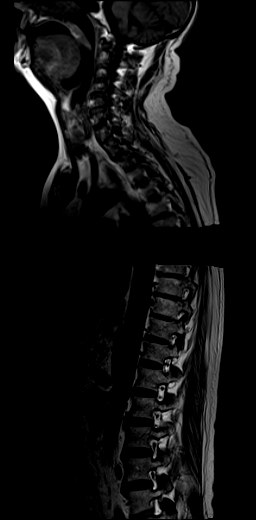
[frame 6/8]
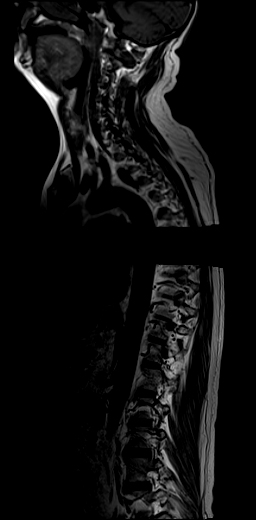
[frame 7/8]
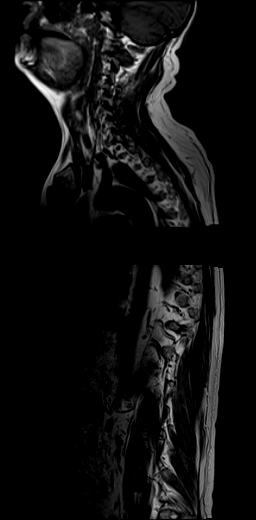
[frame 8/8]
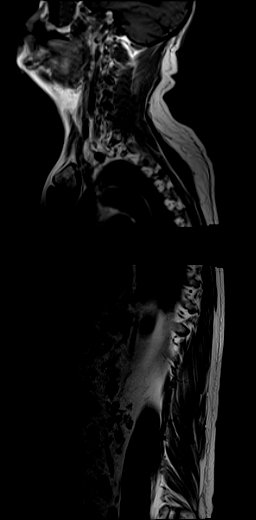

[8 of 8 positions shown; findings below may reference images not displayed]

FINDINGS: MRI THORACIC SPINE FINDINGS

Limited cervical spine imaging: Sagittal T1 and T2 weighted imaging
was performed of the cervical spine in an attempt to quantify a
degree of degenerative cervical spinal stenosis evident on the scout
view. The sagittal T2 weighted imaging is degraded by motion.
However, broad-based posterior disc protrusions are evident at C5-C6
and C6-C7 and result in at least mild cervical spinal stenosis with
probably mild associated spinal cord mass effect. There is no
definite abnormal signal in the cervical spinal cord.

Thoracic spine segmentation:  Appears normal.

Alignment: Thoracic vertebral height and alignment appears stable
since 7857. Mildly exaggerated thoracic kyphosis.

Vertebrae: Visualized bone marrow signal is within normal limits. No
marrow edema or evidence of acute osseous abnormality.

Cord: Confluent abnormal upper thoracic spinal cord signal
consisting of nearly holo cord T2 and STIR hyperintensity from the
T2-T3 to the T4-T5 levels. See series 23772 and series [id number], images
8 and 9. on axial images through this region there does appear to be
a rim of preserved spinal cord white matter. The cord does appear
mildly expanded in the affected area. There are fairly discrete
margins to normal cord signal above the T2-T3 disc and below the mid
T5 vertebra.

There is no other abnormal thoracic spinal cord signal, despite
degenerative spinal stenosis with cord mass effect at T5-T6 (see
below). The conus medullaris is visible on the lumbar images.

Following contrast there is no abnormal spinal cord or intradural
enhancement. No dural thickening.

Paraspinal and other soft tissues: Negative.

Disc levels:

Notable thoracic spine degeneration as follows:

T2-T3: Moderate sized right paracentral to subarticular disc
protrusion with endplate spurring. Mild spinal stenosis with
borderline to mild cord mass effect. Facet hypertrophy. Mild left T3
foraminal stenosis.

T3 and T4 levels: Moderate facet and ligament flavum hypertrophy,
but no spinal stenosis.

T5-T6: Right paracentral cephalad disc extrusion (series 23772,
image 8) superimposed on rightward disc bulging, endplate spurring,
moderate to severe ligament flavum hypertrophy, mild facet
hypertrophy, and epidural lipomatosis. There is moderate to severe
spinal stenosis and moderate cord mass effect (series [id number], image
16).

From T5 inferiorly there is thoracic epidural lipomatosis which
contributes to effaced CSF from the thecal sac.

T8-T9: Mild left paracentral disc bulge or protrusion. Mild spinal
stenosis with minimal left hemi cord mass effect.

T9-T10: More broad-based posterior disc bulging and endplate
spurring with epidural lipomatosis and moderate posterior element
hypertrophy. Mild spinal stenosis with mild ventral cord mass
effect.

MRI LUMBAR SPINE FINDINGS

Segmentation: Normal, concordant with the thoracic spine numbering.
There is a vestigial S1-S2 disc space, but the S1 level is otherwise
fully sacralized.

Alignment:  Normal lumbar lordosis.

Vertebrae: Visualized bone marrow signal is within normal limits. No
marrow edema or evidence of acute osseous abnormality. Intact
visible sacrum and SI joints.

Conus medullaris: Extends to the L2 level and appears normal. No
lower spinal cord or conus signal abnormality.

Paraspinal and other soft tissues: There is a degree of bilateral
renal atrophy. Small T2 hyperintense probable benign right renal
cysts are noted. Negative visible other abdominal viscera. Negative
visualized posterior paraspinal soft tissues.

Disc levels:

Mild lumbar spine degeneration overall.

However, degenerative stenosis as follows:

L4-L5: Circumferential disc bulge with broad-based posterior and
biforaminal involvement. Moderate ligament flavum and moderate to
severe facet hypertrophy. Tiny subligamentous synovial cysts not
contributing to stenosis at this time (series [id number], image 24). Mild
to moderate spinal and bilateral lateral recess stenosis (descending
L5 nerve levels). Borderline to mild bilateral L4 neural foraminal
stenosis, greater on the right.
IMPRESSION: 1. Abnormal upper thoracic spinal cord signal affecting a 4.5 cm
segment from T2-T3 to T4-T5. Central cord involvement with
borderline to mild cord expansion. No enhancement. No other spinal
cord signal abnormality, despite multilevel degenerative spinal
stenosis elsewhere (see #2).
In light of the patient history and Brain MRI findings today a
subacute or chronic spinal cord infarct seems most likely.
Myelomalacia due to compressive myelopathy was considered in light
of the multilevel cervical and thoracic spinal stenosis, but the
cord signal abnormality is remote from the areas of stenosis with
mass effect.
The lack of enhancement argues against an acute inflammatory process
of the cord.

2. Superimposed multilevel degenerative spinal stenosis, exacerbated
in the thoracic spine by epidural lipomatosis. Predominantly mild
degenerative cord mass effect occurs, but at T5-T6 there is moderate
to severe stenosis with moderate cord mass effect, but no cord
signal abnormality at that level.

3. Moderate degenerative lumbar spinal and lateral recess stenosis
at L4-L5.

4.  No acute osseous abnormality identified in the spine.

## 2020-06-06 IMAGING — US US RENAL
1 series · 14 of 25 positions shown · non-contrast
Comparison: No prior.

CLINICAL DATA: Renal injury.

EXAM:
RENAL / URINARY TRACT ULTRASOUND COMPLETE

[Series 1: us renal · 14 of 38 slices shown]
[im 1/38]
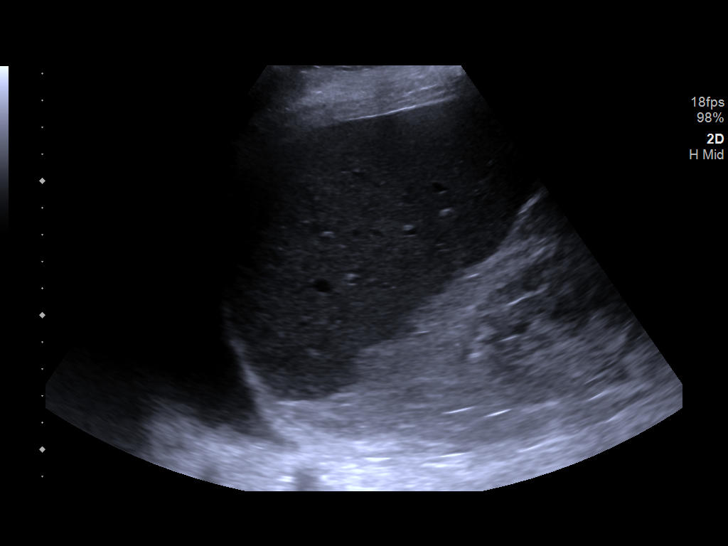
[im 4/38]
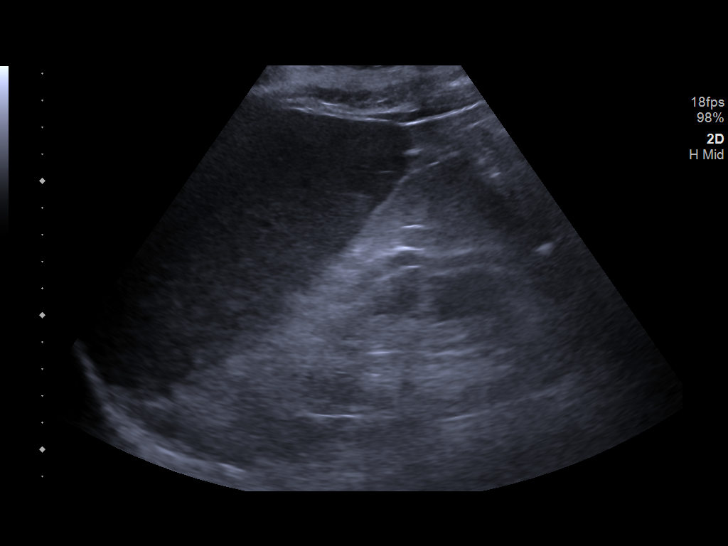
[im 7/38]
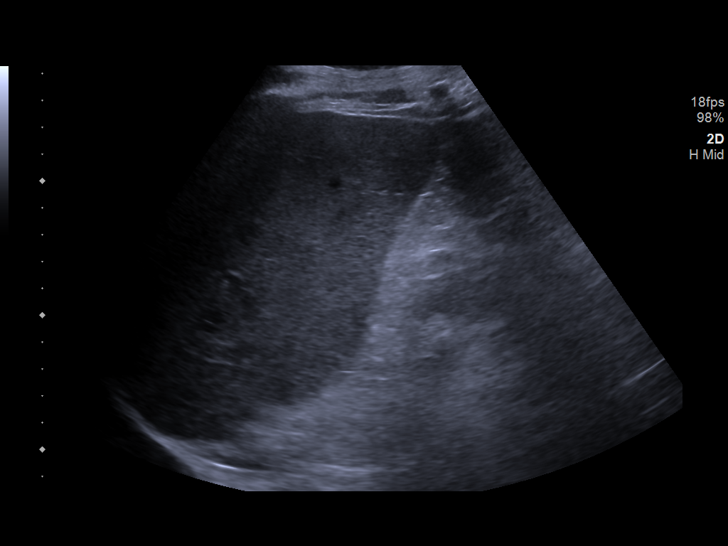
[im 10/38]
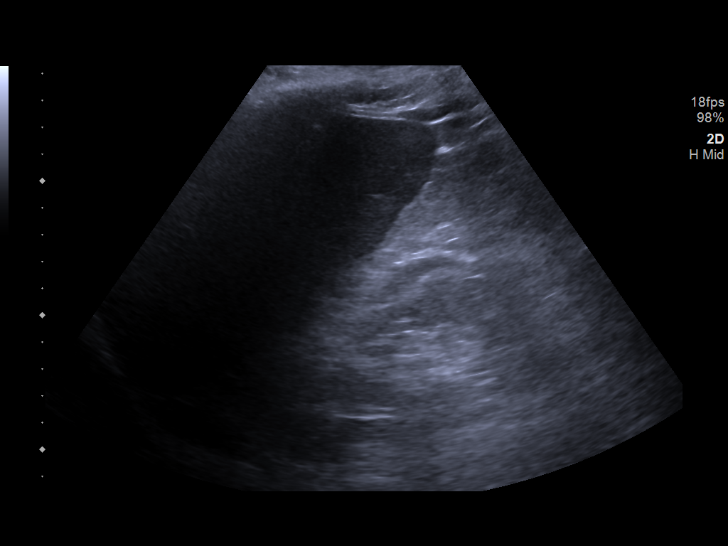
[im 13/38]
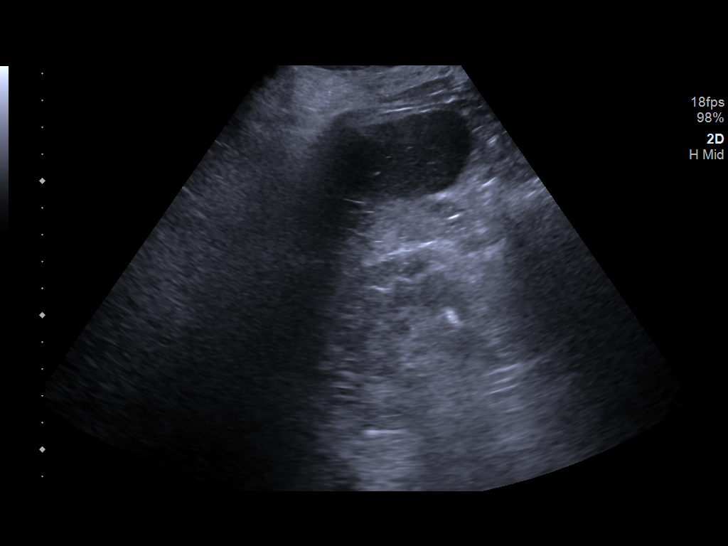
[im 14/38]
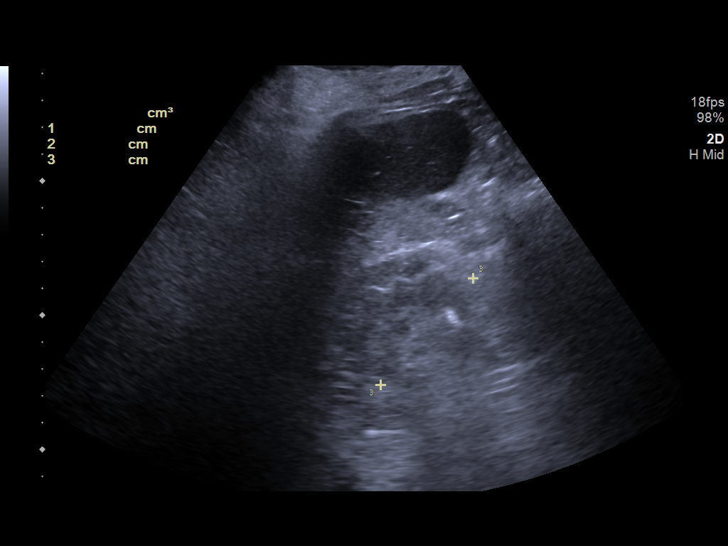
[im 17/38]
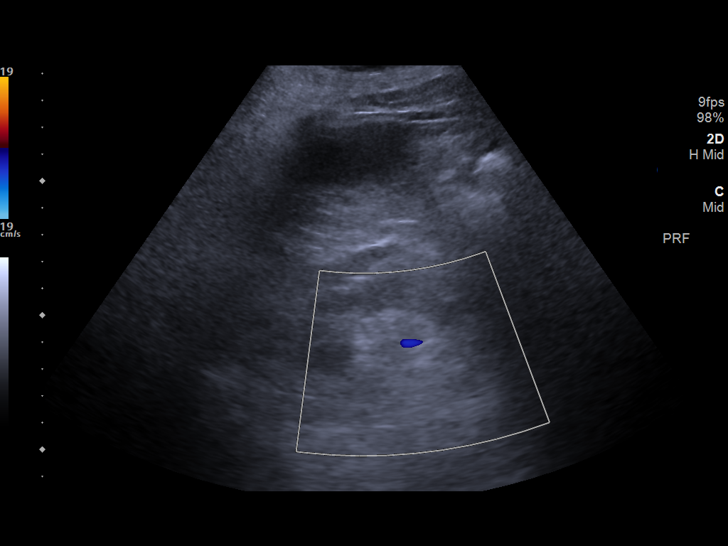
[im 21/38]
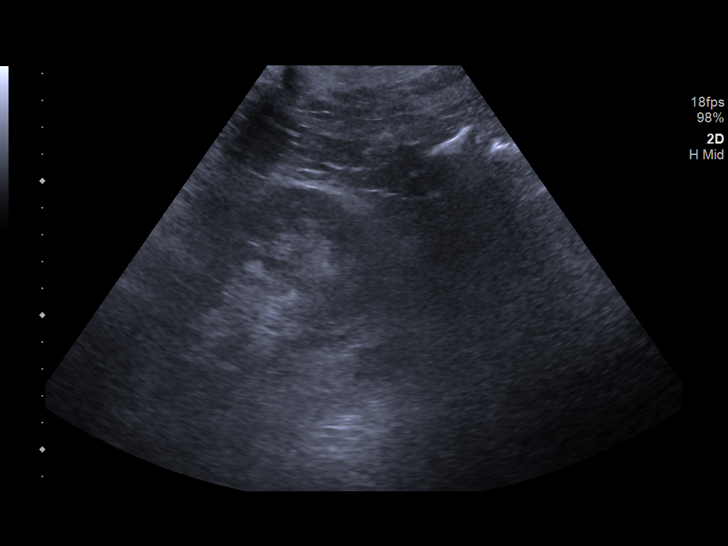
[im 24/38]
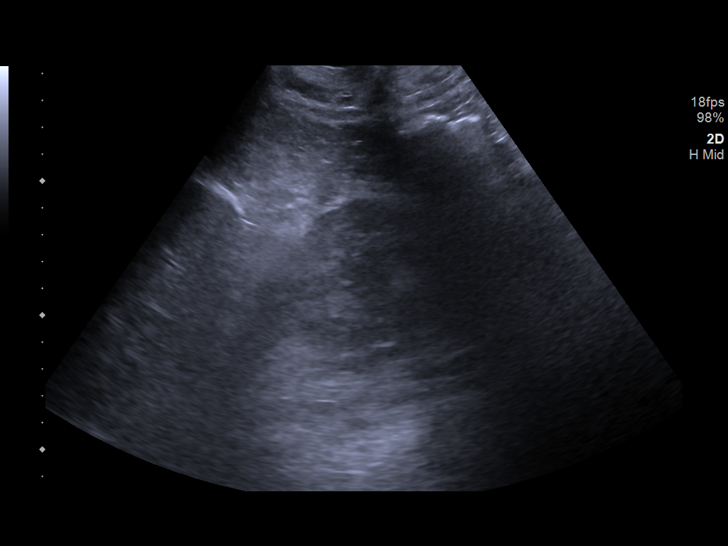
[im 25/38]
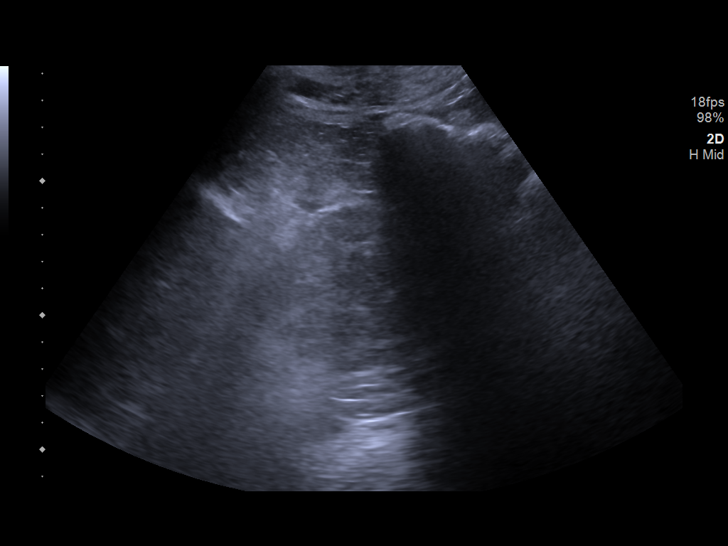
[im 28/38]
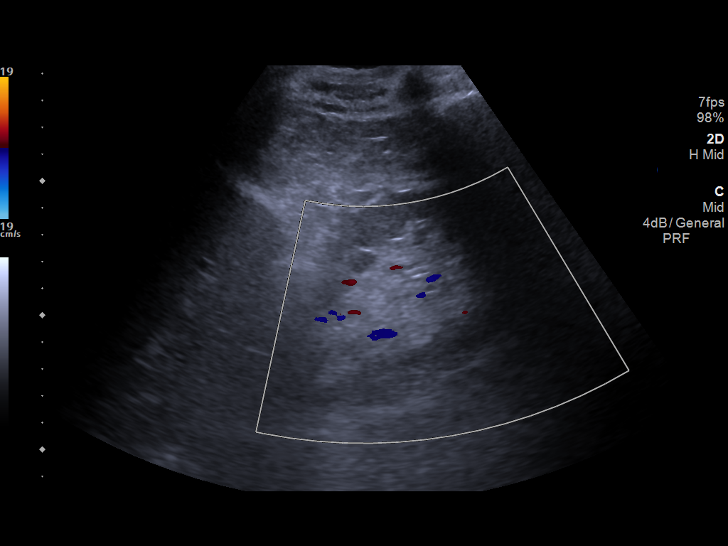
[im 31/38]
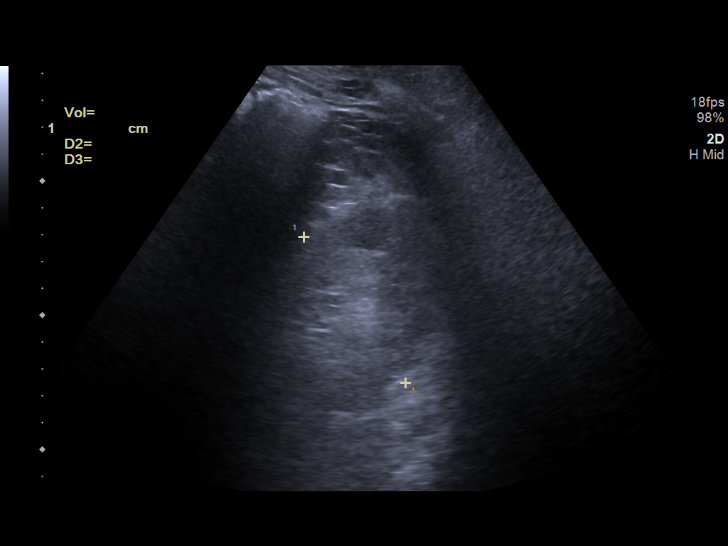
[im 34/38]
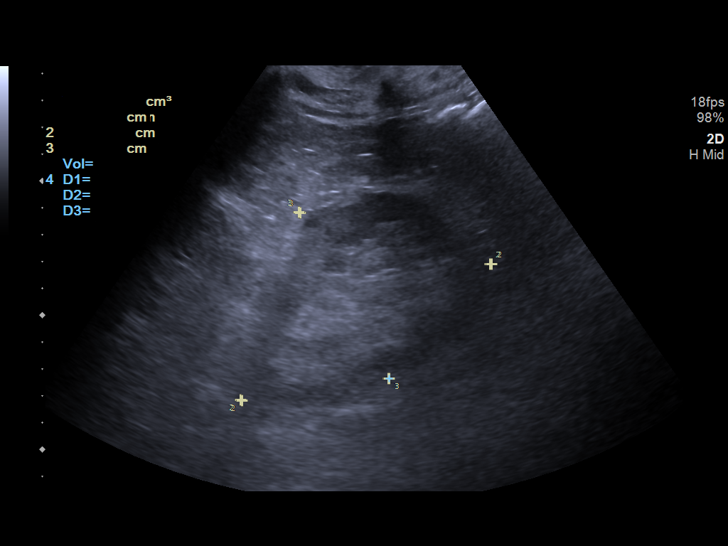
[im 38/38]
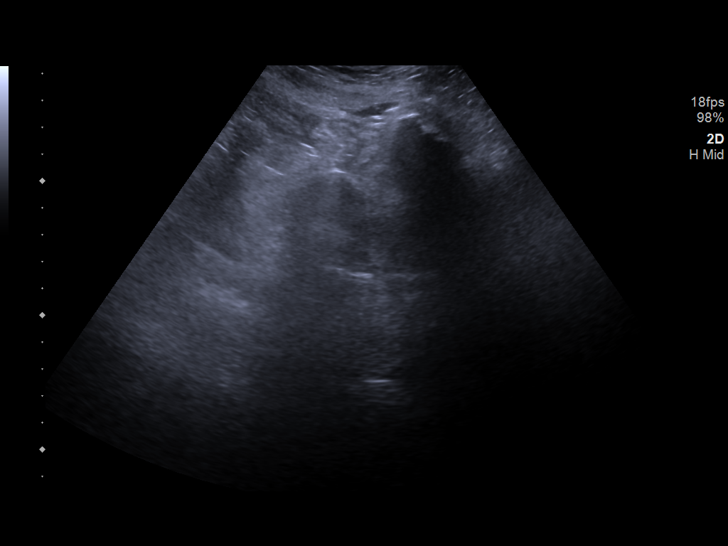

[14 of 25 positions shown; findings below may reference images not displayed]

FINDINGS: Right Kidney:

Renal measurements: 10.7 x 5.7 x 5.3 cm = volume: 168.2 mL.
Increased echogenicity. No mass or hydronephrosis visualized.

Left Kidney:

Renal measurements: 10.6 x 7.0 x 6.7 cm = volume: 258.0 mL.
Increased echogenicity. No mass or hydronephrosis visualized.

Bladder:

Foley catheter in the bladder.

Right pleural effusion incidentally noted.
IMPRESSION: 1. Increased renal echogenicity noted bilaterally. These findings
consistent chronic medical renal disease. No acute abnormality
identified. No hydronephrosis or bladder distention.

2.  Right pleural effusion is incidentally noted.
